# Patient Record
Sex: Male | Born: 1940
Health system: Southern US, Community
[De-identification: ages and names within clinical notes are randomized; demographics above are authoritative.]

## PROBLEM LIST (undated history)

## (undated) DIAGNOSIS — Z972 Presence of dental prosthetic device (complete) (partial): Secondary | ICD-10-CM

## (undated) DIAGNOSIS — I714 Abdominal aortic aneurysm, without rupture, unspecified: Secondary | ICD-10-CM

## (undated) DIAGNOSIS — G4733 Obstructive sleep apnea (adult) (pediatric): Secondary | ICD-10-CM

## (undated) DIAGNOSIS — I6523 Occlusion and stenosis of bilateral carotid arteries: Secondary | ICD-10-CM

## (undated) DIAGNOSIS — I739 Peripheral vascular disease, unspecified: Secondary | ICD-10-CM

## (undated) DIAGNOSIS — E039 Hypothyroidism, unspecified: Secondary | ICD-10-CM

## (undated) DIAGNOSIS — K220 Achalasia of cardia: Secondary | ICD-10-CM

## (undated) DIAGNOSIS — R911 Solitary pulmonary nodule: Secondary | ICD-10-CM

## (undated) DIAGNOSIS — K219 Gastro-esophageal reflux disease without esophagitis: Secondary | ICD-10-CM

## (undated) DIAGNOSIS — I350 Nonrheumatic aortic (valve) stenosis: Secondary | ICD-10-CM

## (undated) DIAGNOSIS — J449 Chronic obstructive pulmonary disease, unspecified: Secondary | ICD-10-CM

## (undated) DIAGNOSIS — Z973 Presence of spectacles and contact lenses: Secondary | ICD-10-CM

## (undated) DIAGNOSIS — I10 Essential (primary) hypertension: Secondary | ICD-10-CM

## (undated) DIAGNOSIS — E119 Type 2 diabetes mellitus without complications: Secondary | ICD-10-CM

## (undated) DIAGNOSIS — F03A0 Unspecified dementia, mild, without behavioral disturbance, psychotic disturbance, mood disturbance, and anxiety: Secondary | ICD-10-CM

## (undated) DIAGNOSIS — F0390 Unspecified dementia without behavioral disturbance: Secondary | ICD-10-CM

## (undated) DIAGNOSIS — G2581 Restless legs syndrome: Secondary | ICD-10-CM

## (undated) DIAGNOSIS — Z952 Presence of prosthetic heart valve: Secondary | ICD-10-CM

## (undated) DIAGNOSIS — C61 Malignant neoplasm of prostate: Secondary | ICD-10-CM

## (undated) DIAGNOSIS — E785 Hyperlipidemia, unspecified: Secondary | ICD-10-CM

## (undated) DIAGNOSIS — K222 Esophageal obstruction: Secondary | ICD-10-CM

## (undated) HISTORY — DX: Hyperlipidemia, unspecified: E78.5

## (undated) HISTORY — PX: UPPER GASTROINTESTINAL ENDOSCOPY: SHX188

## (undated) HISTORY — DX: Gastro-esophageal reflux disease without esophagitis: K21.9

## (undated) HISTORY — PX: CARDIOVASCULAR STRESS TEST: SHX262

## (undated) HISTORY — PX: CATARACT EXTRACTION W/ INTRAOCULAR LENS  IMPLANT, BILATERAL: SHX1307

## (undated) HISTORY — DX: Restless legs syndrome: G25.81

## (undated) HISTORY — DX: Essential (primary) hypertension: I10

## (undated) HISTORY — PX: HERNIA REPAIR: SHX51

## (undated) HISTORY — PX: TRANSTHORACIC ECHOCARDIOGRAM: SHX275

## (undated) HISTORY — PX: ESOPHAGOGASTRODUODENOSCOPY (EGD) WITH ESOPHAGEAL DILATION: SHX5812

## (undated) HISTORY — PX: COLONOSCOPY: SHX174

## (undated) HISTORY — PX: PROSTATE BIOPSY: SHX241

## (undated) HISTORY — DX: Obstructive sleep apnea (adult) (pediatric): G47.33

---

## 1997-11-07 ENCOUNTER — Ambulatory Visit (HOSPITAL_COMMUNITY): Admission: RE | Admit: 1997-11-07 | Discharge: 1997-11-07 | Payer: Self-pay | Admitting: *Deleted

## 1998-06-30 HISTORY — PX: PILONIDAL CYST / SINUS EXCISION: SUR543

## 1998-12-05 ENCOUNTER — Ambulatory Visit (HOSPITAL_COMMUNITY): Admission: RE | Admit: 1998-12-05 | Discharge: 1998-12-05 | Payer: Self-pay | Admitting: *Deleted

## 1998-12-13 ENCOUNTER — Ambulatory Visit (HOSPITAL_COMMUNITY): Admission: RE | Admit: 1998-12-13 | Discharge: 1998-12-13 | Payer: Self-pay | Admitting: *Deleted

## 2001-04-06 ENCOUNTER — Encounter: Admission: RE | Admit: 2001-04-06 | Discharge: 2001-07-05 | Payer: Self-pay | Admitting: Internal Medicine

## 2002-03-21 ENCOUNTER — Encounter: Payer: Self-pay | Admitting: Internal Medicine

## 2002-03-21 ENCOUNTER — Ambulatory Visit (HOSPITAL_COMMUNITY): Admission: RE | Admit: 2002-03-21 | Discharge: 2002-03-21 | Payer: Self-pay | Admitting: Internal Medicine

## 2003-05-22 ENCOUNTER — Ambulatory Visit (HOSPITAL_COMMUNITY): Admission: RE | Admit: 2003-05-22 | Discharge: 2003-05-22 | Payer: Self-pay | Admitting: Internal Medicine

## 2004-05-21 ENCOUNTER — Ambulatory Visit (HOSPITAL_COMMUNITY): Admission: RE | Admit: 2004-05-21 | Discharge: 2004-05-21 | Payer: Self-pay | Admitting: Internal Medicine

## 2005-06-09 ENCOUNTER — Ambulatory Visit (HOSPITAL_COMMUNITY): Admission: RE | Admit: 2005-06-09 | Discharge: 2005-06-09 | Payer: Self-pay | Admitting: Internal Medicine

## 2008-06-30 HISTORY — PX: ANAL FISTULECTOMY: SHX1139

## 2009-07-12 ENCOUNTER — Ambulatory Visit: Payer: Self-pay

## 2009-07-12 ENCOUNTER — Encounter (INDEPENDENT_AMBULATORY_CARE_PROVIDER_SITE_OTHER): Payer: Self-pay | Admitting: Internal Medicine

## 2009-07-12 ENCOUNTER — Ambulatory Visit (HOSPITAL_COMMUNITY): Admission: RE | Admit: 2009-07-12 | Discharge: 2009-07-12 | Payer: Self-pay | Admitting: Internal Medicine

## 2009-07-12 ENCOUNTER — Ambulatory Visit: Payer: Self-pay | Admitting: Cardiology

## 2009-09-10 ENCOUNTER — Encounter: Payer: Self-pay | Admitting: Cardiovascular Disease

## 2009-09-11 ENCOUNTER — Encounter: Payer: Self-pay | Admitting: Cardiovascular Disease

## 2009-09-11 ENCOUNTER — Ambulatory Visit (HOSPITAL_COMMUNITY): Admission: RE | Admit: 2009-09-11 | Discharge: 2009-09-11 | Payer: Self-pay | Admitting: Internal Medicine

## 2009-09-19 ENCOUNTER — Ambulatory Visit (HOSPITAL_COMMUNITY): Admission: RE | Admit: 2009-09-19 | Discharge: 2009-09-19 | Payer: Self-pay | Admitting: Internal Medicine

## 2009-09-28 DIAGNOSIS — K219 Gastro-esophageal reflux disease without esophagitis: Secondary | ICD-10-CM

## 2009-09-28 DIAGNOSIS — I10 Essential (primary) hypertension: Secondary | ICD-10-CM | POA: Insufficient documentation

## 2009-10-01 ENCOUNTER — Ambulatory Visit: Payer: Self-pay | Admitting: Cardiovascular Disease

## 2009-10-02 ENCOUNTER — Telehealth (INDEPENDENT_AMBULATORY_CARE_PROVIDER_SITE_OTHER): Payer: Self-pay | Admitting: *Deleted

## 2009-10-03 ENCOUNTER — Encounter (HOSPITAL_COMMUNITY): Admission: RE | Admit: 2009-10-03 | Discharge: 2009-11-28 | Payer: Self-pay | Admitting: Cardiovascular Disease

## 2009-10-03 ENCOUNTER — Encounter: Payer: Self-pay | Admitting: Cardiovascular Disease

## 2009-10-03 ENCOUNTER — Ambulatory Visit: Payer: Self-pay

## 2009-10-03 ENCOUNTER — Ambulatory Visit: Payer: Self-pay | Admitting: Cardiology

## 2009-10-09 ENCOUNTER — Encounter (INDEPENDENT_AMBULATORY_CARE_PROVIDER_SITE_OTHER): Payer: Self-pay | Admitting: *Deleted

## 2009-10-30 LAB — HM COLONOSCOPY

## 2010-07-30 NOTE — Letter (Signed)
Summary: Radiology Report  Radiology Report   Imported By: Earl Many 09/28/2009 15:16:38  _____________________________________________________________________  External Attachment:    Type:   Image     Comment:   External Document

## 2010-07-30 NOTE — Assessment & Plan Note (Signed)
Summary: Cardiology Nuclear Study  Nuclear Med Background Indications for Stress Test: Evaluation for Ischemia   History: COPD, Echo, Myocardial Perfusion Study  History Comments: 12/04 MPS: EF=61%, (-) ischemia 1/11 Echo: EF=60-65%  Symptoms: Dizziness, DOE, Light-Headedness, SOB    Nuclear Pre-Procedure Cardiac Risk Factors: Family History - CAD, Hypertension, Lipids, PVD Caffeine/Decaff Intake: None NPO After: 7:00 PM Lungs: clear IV 0.9% NS with Angio Cath: 22g     IV Site: (R) AC IV Started by: Irean Hong RN Chest Size (in) 42     Height (in): 65 Weight (lb): 178 BMI: 29.73  Nuclear Med Study 1 or 2 day study:  1 day     Stress Test Type:  Eugenie Birks Reading MD:  Olga Millers, MD     Referring MD:  P. Nishan Resting Radionuclide:  Technetium 46m Tetrofosmin     Resting Radionuclide Dose:  11 mCi  Stress Radionuclide:  Technetium 31m Tetrofosmin     Stress Radionuclide Dose:  33 mCi   Stress Protocol   Lexiscan: 0.4 mg   Stress Test Technologist:  Milana Na EMT-P     Nuclear Technologist:  Domenic Polite CNMT  Rest Procedure  Myocardial perfusion imaging was performed at rest 45 minutes following the intravenous administration of Myoview Technetium 28m Tetrofosmin.  Stress Procedure  The patient received IV Lexiscan 0.4 mg over 15-seconds.  Myoview injected at 30-seconds.  There were no significant changes, chest pressure, and sob with infusion.  Quantitative spect images were obtained after a 45 minute delay.  QPS Raw Data Images:  Acuisition technically good; normal left ventricular size. Stress Images:  There is decreased uptake in the inferior wall. Rest Images:  There is decreased uptake in the inferior wall. Subtraction (SDS):  No evidence of ischemia. Transient Ischemic Dilatation:  1.11  (Normal <1.22)  Lung/Heart Ratio:  .31  (Normal <0.45)  Quantitative Gated Spect Images QGS EDV:  71 ml QGS ESV:  20 ml QGS EF:  73 % QGS cine images:   Normal wall motion.   Overall Impression  Exercise Capacity: Lexiscan study with no exercise. BP Response: Normal blood pressure response. Clinical Symptoms: There is  chest pain ECG Impression: No significant ST segment change suggestive of ischemia. Overall Impression: There is inferior thinning but  no sign of scar or ischemia.  Appended Document: Cardiology Nuclear Study normal nuclear study  Appended Document: Cardiology Nuclear Study n/a x1  Appended Document: Cardiology Nuclear Study letter of results sent to pt

## 2010-07-30 NOTE — Progress Notes (Signed)
Summary: [facility]  [facility]   Imported By: Earl Many 09/28/2009 15:18:21  _____________________________________________________________________  External Attachment:    Type:   Image     Comment:   External Document

## 2010-07-30 NOTE — Letter (Signed)
Summary: [facility] Office Note  [facility] Office Note   Imported By: Roderic Ovens 10/08/2009 13:52:38  _____________________________________________________________________  External Attachment:    Type:   Image     Comment:   External Document

## 2010-07-30 NOTE — Progress Notes (Signed)
Summary: Nuclear Pre-Procedure  Phone Note Outgoing Call Call back at [facility] Phone [phone]   Call placed by: Stanton Kidney, EMT-P,  October 02, 2009 3:26 PM Summary of Call: Unable to leave message with information on Myoview Information Sheet (see scanned document for details). (Verify Phone number)    Nuclear Med Background Indications for Stress Test: Evaluation for Ischemia   History: COPD, Echo, Myocardial Perfusion Study  History Comments: 12/04 MPS: EF=61%, (-) ischemia 1/11 Echo: EF=60-65%     Nuclear Pre-Procedure Cardiac Risk Factors: Family History - CAD, Hypertension, Lipids, PVD Height (in): 65

## 2010-07-30 NOTE — Letter (Signed)
Summary: Generic Letter  Architectural technologist, Main Office  [address]   [place], [place] [address]   Phone: [phone]  Fax: [phone]        October 09, 2009 MRN: [id number]    [facility] [address] [place], [place]  [address]    Dear Mr. Haman,        I have been unable to reach you by phone regarding your test results. The stress test you had was normal. That means your heart muscle is pumping strong and the blood flow into the heart is normal. You also had the ultrasound of your neck and it shows no blockage in those arteries, so this is normal as well. Please call with any questions or concerns.  Sincerely,  Deliah Goody, RN/Dr Charlton Haws  This letter has been electronically signed by your physician.

## 2010-07-30 NOTE — Assessment & Plan Note (Signed)
Summary: np6/cardiac eval/family hx of heart disease/jml   CC:  sob.  History of Present Illness: Robert Schaefer is seen today at the request of Dr Elisabeth Most for SOB, HTN and elevated lipids with a family history of CAD.  He has significant emphysema.  He quit smoking 5 years ago.  He has not had formal PFT's and is not on inhalers.  His LDL is under 70 on statins and his BP is well controlled.  He has had multiple previous myovue's in the past that were normal last being 4 years ago.  I reviewed an echo done in January which was essentially benign with normal LV and only mold AS with mean gradient 12 mmHg and peak 24 mmHg.  I don't think his dypnea is an anginal equivalent.  His baseline ECG is nomral.  He cant walk on a teadmill due to knee pain.  There was no evidence of pulmonary HTN on his echo.    Current Problems (verified): 1)  Carotid Bruit  (ICD-785.9) 2)  COPD  (ICD-496) 3)  Gerd  (ICD-530.81) 4)  Fatigue  (ICD-780.79) 5)  Hypertension  (ICD-401.9) 6)  Hiatal Hernia  (ICD-553.3)  Current Medications (verified): 1)  Zocor 20 Mg Tabs (Simvastatin) .... 1/2  Tab By Mouth Once Daily 2)  Lisinopril 20 Mg Tabs (Lisinopril) .... Take One Tablet By Mouth Daily 3)  Aspirin 81 Mg Tbec (Aspirin) .... Take One Tablet By Mouth Daily 4)  Vitamin D 2000 Unit Caps (Cholecalciferol) .[name].. 1 Tab By Mouth Two Times A Day  Allergies (verified): No Known Drug Allergies  Past History:  Past Medical History: Last updated: 09/28/2009 Current Problems:  COPD (ICD-496) GERD (ICD-530.81) FATIGUE (ICD-780.79) HYPERTENSION (ICD-401.9) HIATAL HERNIA (ICD-553.3)  Family History: Last updated: 09/28/2009 cad  Review of Systems       Denies fever, malais, weight loss, blurry vision, decreased visual acuity, cough, sputum, , hemoptysis, pleuritic pain, palpitaitons, heartburn, abdominal pain, melena, lower extremity edema, claudication, or rash.   Vital Signs:  Patient profile:   70 year old  male Height:      65 inches Weight:      182 pounds BMI:     30.40 Pulse rate:   67 / minute Resp:     14 per minute BP sitting:   128 / 64  (left arm)  Vitals Entered By: Kem Parkinson (October 01, 2009 11:08 AM)  Physical Exam  General:  Affect appropriate Healthy:  appears stated age HEENT: normal Neck supple with no adenopathy JVP normal bilateral  bruits no thyromegaly Lungs clear with mild end expitory wheezing and good diaphragmatic motion Heart:  S1/S2 mild AS  murmur,rub, gallop or click PMI normal Abdomen: benighn, BS positve, no tenderness, no AAA no bruit.  No HSM or HJR Distal pulses intact with no bruits No edema Neuro non-focal Skin warm and dry    Impression & Recommendations:  Problem # 1:  CAROTID BRUIT (ICD-785.9) May be referred from AV but check duplex Orders: Carotid Duplex (Carotid Duplex)  Problem # 2:  COPD (ICD-496) Consider PFT's and pulmonary referral if moderate.  Should be on serevant or maintainence Rx??  Problem # 3:  HYPERTENSION (ICD-401.9) Well controlled His updated medication list for this problem includes:    Lisinopril 20 Mg Tabs (Lisinopril) .[name]... Take one tablet by mouth daily    Aspirin 81 Mg Tbec (Aspirin) .[name]... Take one tablet by mouth daily  Problem # 4:  AORTIC VALVE DISORDERS (ICD-424.1) Mild AS.  Should not  be causing symptoms.  F/U echo in 2 years His updated medication list for this problem includes:    Lisinopril 20 Mg Tabs (Lisinopril) .[name]... Take one tablet by mouth daily  Problem # 5:  DYSPNEA (ICD-786.05) Likely from COPD.  Given risk factors F/U myovue His updated medication list for this problem includes:    Lisinopril 20 Mg Tabs (Lisinopril) .[name]... Take one tablet by mouth daily    Aspirin 81 Mg Tbec (Aspirin) .[name]... Take one tablet by mouth daily  Other Orders: Nuclear Stress Test (Nuc Stress Test)  Patient Instructions: 1)  Your physician recommends that you schedule a follow-up appointment in: AS  NEEDED PENDING TEST RESULTS 2)  Your physician has requested that you have an LEXISCAN stress myoview.  For further information please visit [email or web address]  Please follow instruction sheet, as given. 3)  Your physician has requested that you have a carotid duplex. This test is an ultrasound of the carotid arteries in your neck. It looks at blood flow through these arteries that supply the brain with blood. Allow one hour for this exam. There are no restrictions or special instructions.   EKG Report  Procedure date:  10/01/2009  Findings:      NSR 67 Normal ECG

## 2011-07-30 DIAGNOSIS — E039 Hypothyroidism, unspecified: Secondary | ICD-10-CM | POA: Diagnosis not present

## 2011-09-18 ENCOUNTER — Other Ambulatory Visit (HOSPITAL_COMMUNITY): Payer: Self-pay | Admitting: Physician Assistant

## 2011-09-18 ENCOUNTER — Ambulatory Visit (HOSPITAL_COMMUNITY)
Admission: RE | Admit: 2011-09-18 | Discharge: 2011-09-18 | Disposition: A | Discharge status: Home / Self Care | Payer: Medicare Other | Source: Ambulatory Visit | Attending: Physician Assistant | Admitting: Physician Assistant

## 2011-09-18 DIAGNOSIS — J449 Chronic obstructive pulmonary disease, unspecified: Secondary | ICD-10-CM | POA: Diagnosis not present

## 2011-09-18 DIAGNOSIS — E559 Vitamin D deficiency, unspecified: Secondary | ICD-10-CM | POA: Diagnosis not present

## 2011-09-18 DIAGNOSIS — Z87891 Personal history of nicotine dependence: Secondary | ICD-10-CM | POA: Diagnosis not present

## 2011-09-18 DIAGNOSIS — R7309 Other abnormal glucose: Secondary | ICD-10-CM | POA: Diagnosis not present

## 2011-09-18 DIAGNOSIS — I10 Essential (primary) hypertension: Secondary | ICD-10-CM

## 2011-09-18 DIAGNOSIS — Z79899 Other long term (current) drug therapy: Secondary | ICD-10-CM | POA: Diagnosis not present

## 2011-09-18 DIAGNOSIS — E782 Mixed hyperlipidemia: Secondary | ICD-10-CM | POA: Diagnosis not present

## 2011-09-18 DIAGNOSIS — Z1212 Encounter for screening for malignant neoplasm of rectum: Secondary | ICD-10-CM | POA: Diagnosis not present

## 2011-09-18 DIAGNOSIS — J4489 Other specified chronic obstructive pulmonary disease: Secondary | ICD-10-CM | POA: Insufficient documentation

## 2011-09-18 DIAGNOSIS — Z125 Encounter for screening for malignant neoplasm of prostate: Secondary | ICD-10-CM | POA: Diagnosis not present

## 2011-09-18 DIAGNOSIS — R0602 Shortness of breath: Secondary | ICD-10-CM | POA: Insufficient documentation

## 2011-09-18 DIAGNOSIS — R972 Elevated prostate specific antigen [PSA]: Secondary | ICD-10-CM | POA: Diagnosis not present

## 2011-09-24 ENCOUNTER — Other Ambulatory Visit: Payer: Self-pay | Admitting: Gastroenterology

## 2011-09-24 DIAGNOSIS — R1314 Dysphagia, pharyngoesophageal phase: Secondary | ICD-10-CM | POA: Diagnosis not present

## 2011-09-30 ENCOUNTER — Ambulatory Visit
Admission: RE | Admit: 2011-09-30 | Discharge: 2011-09-30 | Disposition: A | Discharge status: Home / Self Care | Payer: Medicare Other | Source: Ambulatory Visit | Attending: Gastroenterology | Admitting: Gastroenterology

## 2011-09-30 DIAGNOSIS — K222 Esophageal obstruction: Secondary | ICD-10-CM | POA: Diagnosis not present

## 2011-09-30 DIAGNOSIS — K228 Other specified diseases of esophagus: Secondary | ICD-10-CM | POA: Diagnosis not present

## 2011-09-30 DIAGNOSIS — R1310 Dysphagia, unspecified: Secondary | ICD-10-CM | POA: Diagnosis not present

## 2011-10-20 DIAGNOSIS — K449 Diaphragmatic hernia without obstruction or gangrene: Secondary | ICD-10-CM | POA: Diagnosis not present

## 2011-10-20 DIAGNOSIS — R933 Abnormal findings on diagnostic imaging of other parts of digestive tract: Secondary | ICD-10-CM | POA: Diagnosis not present

## 2011-10-20 DIAGNOSIS — R1310 Dysphagia, unspecified: Secondary | ICD-10-CM | POA: Diagnosis not present

## 2011-10-20 DIAGNOSIS — K222 Esophageal obstruction: Secondary | ICD-10-CM | POA: Diagnosis not present

## 2011-10-24 DIAGNOSIS — R972 Elevated prostate specific antigen [PSA]: Secondary | ICD-10-CM | POA: Diagnosis not present

## 2011-12-15 DIAGNOSIS — R972 Elevated prostate specific antigen [PSA]: Secondary | ICD-10-CM | POA: Diagnosis not present

## 2011-12-15 DIAGNOSIS — E782 Mixed hyperlipidemia: Secondary | ICD-10-CM | POA: Diagnosis not present

## 2011-12-15 DIAGNOSIS — R7309 Other abnormal glucose: Secondary | ICD-10-CM | POA: Diagnosis not present

## 2011-12-15 DIAGNOSIS — E559 Vitamin D deficiency, unspecified: Secondary | ICD-10-CM | POA: Diagnosis not present

## 2011-12-15 DIAGNOSIS — Z79899 Other long term (current) drug therapy: Secondary | ICD-10-CM | POA: Diagnosis not present

## 2011-12-15 DIAGNOSIS — I10 Essential (primary) hypertension: Secondary | ICD-10-CM | POA: Diagnosis not present

## 2012-01-15 DIAGNOSIS — L821 Other seborrheic keratosis: Secondary | ICD-10-CM | POA: Diagnosis not present

## 2012-01-15 DIAGNOSIS — I781 Nevus, non-neoplastic: Secondary | ICD-10-CM | POA: Diagnosis not present

## 2012-01-15 DIAGNOSIS — L919 Hypertrophic disorder of the skin, unspecified: Secondary | ICD-10-CM | POA: Diagnosis not present

## 2012-01-15 DIAGNOSIS — L909 Atrophic disorder of skin, unspecified: Secondary | ICD-10-CM | POA: Diagnosis not present

## 2012-03-17 DIAGNOSIS — E559 Vitamin D deficiency, unspecified: Secondary | ICD-10-CM | POA: Diagnosis not present

## 2012-03-17 DIAGNOSIS — E782 Mixed hyperlipidemia: Secondary | ICD-10-CM | POA: Diagnosis not present

## 2012-03-17 DIAGNOSIS — I10 Essential (primary) hypertension: Secondary | ICD-10-CM | POA: Diagnosis not present

## 2012-03-17 DIAGNOSIS — Z79899 Other long term (current) drug therapy: Secondary | ICD-10-CM | POA: Diagnosis not present

## 2012-03-17 DIAGNOSIS — R7309 Other abnormal glucose: Secondary | ICD-10-CM | POA: Diagnosis not present

## 2012-03-31 DIAGNOSIS — G4700 Insomnia, unspecified: Secondary | ICD-10-CM | POA: Diagnosis not present

## 2012-03-31 DIAGNOSIS — G4710 Hypersomnia, unspecified: Secondary | ICD-10-CM | POA: Diagnosis not present

## 2012-03-31 DIAGNOSIS — G4730 Sleep apnea, unspecified: Secondary | ICD-10-CM | POA: Diagnosis not present

## 2012-03-31 DIAGNOSIS — R0989 Other specified symptoms and signs involving the circulatory and respiratory systems: Secondary | ICD-10-CM | POA: Diagnosis not present

## 2012-03-31 DIAGNOSIS — G4733 Obstructive sleep apnea (adult) (pediatric): Secondary | ICD-10-CM | POA: Diagnosis not present

## 2012-03-31 DIAGNOSIS — R0609 Other forms of dyspnea: Secondary | ICD-10-CM | POA: Diagnosis not present

## 2012-04-20 DIAGNOSIS — E782 Mixed hyperlipidemia: Secondary | ICD-10-CM | POA: Diagnosis not present

## 2012-04-20 DIAGNOSIS — I10 Essential (primary) hypertension: Secondary | ICD-10-CM | POA: Diagnosis not present

## 2012-04-20 DIAGNOSIS — R972 Elevated prostate specific antigen [PSA]: Secondary | ICD-10-CM | POA: Diagnosis not present

## 2012-04-20 DIAGNOSIS — R7309 Other abnormal glucose: Secondary | ICD-10-CM | POA: Diagnosis not present

## 2012-04-26 ENCOUNTER — Other Ambulatory Visit (HOSPITAL_COMMUNITY): Payer: Self-pay | Admitting: Internal Medicine

## 2012-04-26 DIAGNOSIS — R0602 Shortness of breath: Secondary | ICD-10-CM

## 2012-04-27 ENCOUNTER — Ambulatory Visit (HOSPITAL_COMMUNITY): Payer: Medicare Other | Attending: Cardiology | Admitting: Radiology

## 2012-04-27 DIAGNOSIS — I10 Essential (primary) hypertension: Secondary | ICD-10-CM | POA: Diagnosis not present

## 2012-04-27 DIAGNOSIS — R5383 Other fatigue: Secondary | ICD-10-CM | POA: Diagnosis not present

## 2012-04-27 DIAGNOSIS — I369 Nonrheumatic tricuspid valve disorder, unspecified: Secondary | ICD-10-CM | POA: Diagnosis not present

## 2012-04-27 DIAGNOSIS — R0609 Other forms of dyspnea: Secondary | ICD-10-CM | POA: Diagnosis not present

## 2012-04-27 DIAGNOSIS — G4733 Obstructive sleep apnea (adult) (pediatric): Secondary | ICD-10-CM | POA: Diagnosis not present

## 2012-04-27 DIAGNOSIS — I080 Rheumatic disorders of both mitral and aortic valves: Secondary | ICD-10-CM | POA: Insufficient documentation

## 2012-04-27 DIAGNOSIS — R0989 Other specified symptoms and signs involving the circulatory and respiratory systems: Secondary | ICD-10-CM

## 2012-04-27 DIAGNOSIS — J4489 Other specified chronic obstructive pulmonary disease: Secondary | ICD-10-CM | POA: Insufficient documentation

## 2012-04-27 DIAGNOSIS — J449 Chronic obstructive pulmonary disease, unspecified: Secondary | ICD-10-CM | POA: Diagnosis not present

## 2012-04-27 DIAGNOSIS — R0602 Shortness of breath: Secondary | ICD-10-CM

## 2012-04-27 DIAGNOSIS — G4700 Insomnia, unspecified: Secondary | ICD-10-CM | POA: Diagnosis not present

## 2012-04-27 NOTE — Progress Notes (Signed)
Echocardiogram performed.  

## 2012-04-28 ENCOUNTER — Encounter (HOSPITAL_COMMUNITY): Payer: Self-pay | Admitting: Internal Medicine

## 2012-06-21 DIAGNOSIS — E782 Mixed hyperlipidemia: Secondary | ICD-10-CM | POA: Diagnosis not present

## 2012-06-21 DIAGNOSIS — R7309 Other abnormal glucose: Secondary | ICD-10-CM | POA: Diagnosis not present

## 2012-06-21 DIAGNOSIS — I10 Essential (primary) hypertension: Secondary | ICD-10-CM | POA: Diagnosis not present

## 2012-06-21 DIAGNOSIS — E559 Vitamin D deficiency, unspecified: Secondary | ICD-10-CM | POA: Diagnosis not present

## 2012-06-21 DIAGNOSIS — Z79899 Other long term (current) drug therapy: Secondary | ICD-10-CM | POA: Diagnosis not present

## 2012-07-21 DIAGNOSIS — R7309 Other abnormal glucose: Secondary | ICD-10-CM | POA: Diagnosis not present

## 2012-07-21 DIAGNOSIS — R823 Hemoglobinuria: Secondary | ICD-10-CM | POA: Diagnosis not present

## 2012-07-21 DIAGNOSIS — N419 Inflammatory disease of prostate, unspecified: Secondary | ICD-10-CM | POA: Diagnosis not present

## 2012-07-21 DIAGNOSIS — I10 Essential (primary) hypertension: Secondary | ICD-10-CM | POA: Diagnosis not present

## 2012-09-22 DIAGNOSIS — Z125 Encounter for screening for malignant neoplasm of prostate: Secondary | ICD-10-CM | POA: Diagnosis not present

## 2012-09-22 DIAGNOSIS — E119 Type 2 diabetes mellitus without complications: Secondary | ICD-10-CM | POA: Diagnosis not present

## 2012-09-22 DIAGNOSIS — E559 Vitamin D deficiency, unspecified: Secondary | ICD-10-CM | POA: Diagnosis not present

## 2012-09-22 DIAGNOSIS — N3000 Acute cystitis without hematuria: Secondary | ICD-10-CM | POA: Diagnosis not present

## 2012-09-22 DIAGNOSIS — M79609 Pain in unspecified limb: Secondary | ICD-10-CM | POA: Diagnosis not present

## 2012-09-22 DIAGNOSIS — Z79899 Other long term (current) drug therapy: Secondary | ICD-10-CM | POA: Diagnosis not present

## 2012-09-22 DIAGNOSIS — I10 Essential (primary) hypertension: Secondary | ICD-10-CM | POA: Diagnosis not present

## 2012-09-22 DIAGNOSIS — E782 Mixed hyperlipidemia: Secondary | ICD-10-CM | POA: Diagnosis not present

## 2012-12-05 DIAGNOSIS — G44209 Tension-type headache, unspecified, not intractable: Secondary | ICD-10-CM | POA: Diagnosis not present

## 2012-12-22 DIAGNOSIS — E559 Vitamin D deficiency, unspecified: Secondary | ICD-10-CM | POA: Diagnosis not present

## 2012-12-22 DIAGNOSIS — R972 Elevated prostate specific antigen [PSA]: Secondary | ICD-10-CM | POA: Diagnosis not present

## 2012-12-22 DIAGNOSIS — E782 Mixed hyperlipidemia: Secondary | ICD-10-CM | POA: Diagnosis not present

## 2012-12-22 DIAGNOSIS — R7309 Other abnormal glucose: Secondary | ICD-10-CM | POA: Diagnosis not present

## 2012-12-22 DIAGNOSIS — I10 Essential (primary) hypertension: Secondary | ICD-10-CM | POA: Diagnosis not present

## 2012-12-22 DIAGNOSIS — Z79899 Other long term (current) drug therapy: Secondary | ICD-10-CM | POA: Diagnosis not present

## 2013-02-02 DIAGNOSIS — N400 Enlarged prostate without lower urinary tract symptoms: Secondary | ICD-10-CM | POA: Diagnosis not present

## 2013-03-28 ENCOUNTER — Ambulatory Visit (HOSPITAL_COMMUNITY)
Admission: RE | Admit: 2013-03-28 | Discharge: 2013-03-28 | Disposition: A | Discharge status: Home / Self Care | Payer: Medicare Other | Source: Ambulatory Visit | Attending: Physician Assistant | Admitting: Physician Assistant

## 2013-03-28 ENCOUNTER — Other Ambulatory Visit (HOSPITAL_COMMUNITY): Payer: Self-pay | Admitting: Physician Assistant

## 2013-03-28 DIAGNOSIS — R7309 Other abnormal glucose: Secondary | ICD-10-CM | POA: Diagnosis not present

## 2013-03-28 DIAGNOSIS — E782 Mixed hyperlipidemia: Secondary | ICD-10-CM | POA: Diagnosis not present

## 2013-03-28 DIAGNOSIS — I700 Atherosclerosis of aorta: Secondary | ICD-10-CM | POA: Insufficient documentation

## 2013-03-28 DIAGNOSIS — M545 Low back pain, unspecified: Secondary | ICD-10-CM | POA: Insufficient documentation

## 2013-03-28 DIAGNOSIS — M5137 Other intervertebral disc degeneration, lumbosacral region: Secondary | ICD-10-CM | POA: Diagnosis not present

## 2013-03-28 DIAGNOSIS — E559 Vitamin D deficiency, unspecified: Secondary | ICD-10-CM | POA: Diagnosis not present

## 2013-03-28 DIAGNOSIS — I77819 Aortic ectasia, unspecified site: Secondary | ICD-10-CM | POA: Insufficient documentation

## 2013-03-28 DIAGNOSIS — M79609 Pain in unspecified limb: Secondary | ICD-10-CM | POA: Diagnosis not present

## 2013-03-28 DIAGNOSIS — Z79899 Other long term (current) drug therapy: Secondary | ICD-10-CM | POA: Diagnosis not present

## 2013-03-28 DIAGNOSIS — I10 Essential (primary) hypertension: Secondary | ICD-10-CM | POA: Diagnosis not present

## 2013-04-13 DIAGNOSIS — M533 Sacrococcygeal disorders, not elsewhere classified: Secondary | ICD-10-CM | POA: Diagnosis not present

## 2013-04-21 DIAGNOSIS — M533 Sacrococcygeal disorders, not elsewhere classified: Secondary | ICD-10-CM | POA: Diagnosis not present

## 2013-04-28 DIAGNOSIS — L570 Actinic keratosis: Secondary | ICD-10-CM | POA: Diagnosis not present

## 2013-05-04 DIAGNOSIS — M533 Sacrococcygeal disorders, not elsewhere classified: Secondary | ICD-10-CM | POA: Diagnosis not present

## 2013-05-17 DIAGNOSIS — N400 Enlarged prostate without lower urinary tract symptoms: Secondary | ICD-10-CM | POA: Diagnosis not present

## 2013-07-04 DIAGNOSIS — G4733 Obstructive sleep apnea (adult) (pediatric): Secondary | ICD-10-CM | POA: Insufficient documentation

## 2013-07-04 DIAGNOSIS — R7303 Prediabetes: Secondary | ICD-10-CM | POA: Insufficient documentation

## 2013-07-04 DIAGNOSIS — K589 Irritable bowel syndrome without diarrhea: Secondary | ICD-10-CM | POA: Insufficient documentation

## 2013-07-04 DIAGNOSIS — E782 Mixed hyperlipidemia: Secondary | ICD-10-CM | POA: Insufficient documentation

## 2013-07-04 DIAGNOSIS — E785 Hyperlipidemia, unspecified: Secondary | ICD-10-CM

## 2013-07-04 DIAGNOSIS — G2581 Restless legs syndrome: Secondary | ICD-10-CM | POA: Insufficient documentation

## 2013-07-04 DIAGNOSIS — J449 Chronic obstructive pulmonary disease, unspecified: Secondary | ICD-10-CM | POA: Insufficient documentation

## 2013-07-04 NOTE — Progress Notes (Signed)
Patient ID: Robert Schaefer, male   DOB: 1940-07-19, 73 y.o.   MRN: [id number]   This very nice 73 y.o. MWM presents for 3 month follow up with Hypertension, Hyperlipidemia, Hypothyroidism,COPD/Asthma, GERD/IBS, Hypothyroidism Pre-Diabetes and Vitamin D Deficiency.    HTN predates since 1995. BP has been controlled at home. Today's BP: 126/44 mmHg . Patient denies any cardiac type chest pain, palpitations, dyspnea/orthopnea/PND, dizziness, claudication, or dependent edema.   Hyperlipidemia is controlled with diet & meds. Last Cholesterol was 126, Triglycerides were   123, HDL 47 and LDL  56 in Sept - all at goal. Patient denies myalgias or other med SE's.    Also, the patient has history of PreDiabetes since 2002 with last A1c of  6.1% in Sept. Patient denies any symptoms of reactive hypoglycemia, diabetic polys, paresthesias or visual blurring.   Patient is being followed by Dr Gaynelle Arabian for his BPH/ Prostatism and desires switch to a less expensive medicine than Uroxatral at ~ $150 / mo. He reports improvement on the medication. He is also being followed for a rising PSA.   Further, Patient has history of Vitamin D Deficiency with last vitamin D of 90 in Sept 2014. Patient supplements vitamin D without any suspected side-effects.    Medication List     aspirin 81 MG chewable tablet  Chew 81 mg by mouth daily.     doxazosin 8 MG tablet  Commonly known as:  CARDURA  1/4 to 1/2 tablet at bedtime  for prostate and bladder emptying as directed     fluticasone 50 MCG/ACT nasal spray  Commonly known as:  FLONASE  Place 2 sprays into both nostrils daily.     levothyroxine 50 MCG tablet  Commonly known as:  SYNTHROID, LEVOTHROID  Take 50 mcg by mouth daily before breakfast.     lisinopril 20 MG tablet  Commonly known as:  PRINIVIL,ZESTRIL  Take 20 mg by mouth daily.     simvastatin 20 MG tablet  Commonly known as:  ZOCOR  Take 20 mg by mouth daily. Takes 1/2 tab daily     VITAMIN D PO   Take 7,000 Int'l Units by mouth daily.          Allergies  Allergen Reactions  . Penicillins Anaphylaxis and Swelling  . Sulfa Antibiotics Anaphylaxis and Swelling    PMHx:   Past Medical History  Diagnosis Date  . Hypertension   . Hyperlipidemia   . GERD (gastroesophageal reflux disease)   . Prediabetes   . Asthma   . IBS (irritable bowel syndrome)   . Thyroid disease   . RLS (restless legs syndrome)   . OSA (obstructive sleep apnea)     FHx:    Reviewed / unchanged  SHx:    Reviewed / unchanged  Systems Review: Constitutional: Denies fever, chills, wt changes, headaches, insomnia, fatigue, night sweats, change in appetite. Eyes: Denies redness, blurred vision, diplopia, discharge, itchy, watery eyes.  ENT: Denies discharge, congestion, post nasal drip, epistaxis, sore throat, earache, hearing loss, dental pain, tinnitus, vertigo, sinus pain, snoring.  CV: Denies chest pain, palpitations, irregular heartbeat, syncope, dyspnea, diaphoresis, orthopnea, PND, claudication, edema. Respiratory: denies cough, dyspnea, DOE, pleurisy, hoarseness, laryngitis, wheezing.  Gastrointestinal: Denies dysphagia, odynophagia, heartburn, reflux, water brash, abdominal pain or cramps, nausea, vomiting, bloating, diarrhea, constipation, hematemesis, melena, hematochezia,  or hemorrhoids. Genitourinary: Denies dysuria, frequency, urgency, nocturia, hesitancy, discharge, hematuria, flank pain. Musculoskeletal: Denies arthralgias, myalgias, stiffness, jt. swelling, pain, limp, strain/sprain.  Skin: Denies pruritus, rash, hives, warts,  acne, eczema, change in skin lesion(s). Neuro: No weakness, tremor, incoordination, spasms, paresthesia, or pain. Psychiatric: Denies confusion, memory loss, or sensory loss. Endo: Denies change in weight, skin, hair change.  Heme/Lymph: No excessive bleeding, bruising, orenlarged lymph nodes.  BP: 126/44  Pulse: 72  Temp: 98.6 F (37 C)  Resp: 16      body mass index is 31.45 kg/(m^2)    Height  5\' 5"    Weight  189 lb  On Exam: Appears well nourished - in no distress. Eyes: PERRLA, EOMs, conjunctiva no swelling or erythema. Sinuses: No frontal/maxillary tenderness ENT/Mouth: EAC's clear, TM's nl w/o erythema, bulging. Nares clear w/o erythema, swelling, exudates. Oropharynx clear without erythema or exudates. Oral hygiene is good. Tongue normal, non obstructing. Hearing intact.  Neck: Supple. Thyroid nl. Car 2+/2+ without bruits, nodes or JVD. Chest: Respirations nl with BS clear & equal w/o rales, rhonchi, wheezing or stridor.  Cor: Heart sounds normal w/ regular rate and rhythm without sig. murmurs, gallops, clicks, or rubs. Peripheral pulses normal and equal  without edema.  Abdomen: Soft & bowel sounds normal. Non-tender w/o guarding, rebound, hernias, masses, or organomegaly.  Lymphatics: Unremarkable.  Musculoskeletal: Full ROM all peripheral extremities, joint stability, 5/5 strength, and normal gait.  Skin: Warm, dry without exposed rashes, lesions, ecchymosis apparent.  Neuro: Cranial nerves intact, reflexes equal bilaterally. Sensory-motor testing grossly intact. Tendon reflexes grossly intact.  Pysch: Alert & oriented x 3. Insight and judgement nl & appropriate. No ideations.  Assessment and Plan:  1. Hypertension - Continue monitor blood pressure at home. Continue diet/meds same.  2. Hyperlipidemia - Continue diet/meds, exercise,& lifestyle modifications. Continue monitor periodic cholesterol/liver & renal functions   3. Pre-diabetes/Insulin Resistance - Continue diet, exercise, lifestyle modifications. Monitor appropriate labs.  4. Vitamin D Deficiency - Continue supplementation.  5 Hypothyroidism  6. GERD  7. BPH/ Prostatism - switch Uroxotral to doxazosin of $$$ concerns  Recommended regular exercise, BP monitoring, weight control, and discussed med and SE's. Recommended labs to assess and monitor clinical status.  Further disposition pending results of labs.

## 2013-07-04 NOTE — Patient Instructions (Signed)

## 2013-07-05 ENCOUNTER — Encounter: Payer: Self-pay | Admitting: Internal Medicine

## 2013-07-05 ENCOUNTER — Ambulatory Visit (INDEPENDENT_AMBULATORY_CARE_PROVIDER_SITE_OTHER): Payer: Medicare Other | Admitting: Internal Medicine

## 2013-07-05 ENCOUNTER — Other Ambulatory Visit: Payer: Self-pay | Admitting: Internal Medicine

## 2013-07-05 VITALS — BP 126/44 | HR 72 | Temp 98.6°F | Resp 16 | Wt 189.0 lb

## 2013-07-05 DIAGNOSIS — Z79899 Other long term (current) drug therapy: Secondary | ICD-10-CM

## 2013-07-05 DIAGNOSIS — I10 Essential (primary) hypertension: Secondary | ICD-10-CM | POA: Diagnosis not present

## 2013-07-05 DIAGNOSIS — E782 Mixed hyperlipidemia: Secondary | ICD-10-CM | POA: Diagnosis not present

## 2013-07-05 DIAGNOSIS — E559 Vitamin D deficiency, unspecified: Secondary | ICD-10-CM

## 2013-07-05 DIAGNOSIS — R7309 Other abnormal glucose: Secondary | ICD-10-CM | POA: Diagnosis not present

## 2013-07-05 LAB — CBC WITH DIFFERENTIAL/PLATELET
Basophils Absolute: 0 10*3/uL (ref 0.0–0.1)
Basophils Relative: 1 % (ref 0–1)
EOS ABS: 0.1 10*3/uL (ref 0.0–0.7)
Eosinophils Relative: 1 % (ref 0–5)
HEMATOCRIT: 38 % — AB (ref 39.0–52.0)
HEMOGLOBIN: 13.3 g/dL (ref 13.0–17.0)
Lymphocytes Relative: 17 % (ref 12–46)
Lymphs Abs: 1.1 10*3/uL (ref 0.7–4.0)
MCH: 31.8 pg (ref 26.0–34.0)
MCHC: 35 g/dL (ref 30.0–36.0)
MCV: 90.9 fL (ref 78.0–100.0)
Monocytes Absolute: 0.6 10*3/uL (ref 0.1–1.0)
Monocytes Relative: 9 % (ref 3–12)
NEUTROS ABS: 4.6 10*3/uL (ref 1.7–7.7)
NEUTROS PCT: 72 % (ref 43–77)
PLATELETS: 122 10*3/uL — AB (ref 150–400)
RBC: 4.18 MIL/uL — ABNORMAL LOW (ref 4.22–5.81)
RDW: 14.9 % (ref 11.5–15.5)
WBC: 6.3 10*3/uL (ref 4.0–10.5)

## 2013-07-05 LAB — LIPID PANEL
CHOL/HDL RATIO: 2.2 ratio
CHOLESTEROL: 122 mg/dL (ref 0–200)
HDL: 55 mg/dL (ref >39)
LDL Cholesterol: 55 mg/dL (ref 0–99)
TRIGLYCERIDES: 60 mg/dL (ref <150)
VLDL: 12 mg/dL (ref 0–40)

## 2013-07-05 LAB — HEPATIC FUNCTION PANEL
ALK PHOS: 37 U/L — AB (ref 39–117)
ALT: 8 U/L (ref 0–53)
AST: 12 U/L (ref 0–37)
Albumin: 3.9 g/dL (ref 3.5–5.2)
BILIRUBIN TOTAL: 1.2 mg/dL (ref 0.3–1.2)
Bilirubin, Direct: 0.3 mg/dL (ref 0.0–0.3)
Indirect Bilirubin: 0.9 mg/dL (ref 0.0–0.9)
TOTAL PROTEIN: 6.4 g/dL (ref 6.0–8.3)

## 2013-07-05 LAB — BASIC METABOLIC PANEL WITH GFR
BUN: 19 mg/dL (ref 6–23)
CHLORIDE: 107 meq/L (ref 96–112)
CO2: 24 meq/L (ref 19–32)
CREATININE: 0.93 mg/dL (ref 0.50–1.35)
Calcium: 9.4 mg/dL (ref 8.4–10.5)
GFR, EST NON AFRICAN AMERICAN: 81 mL/min
GLUCOSE: 122 mg/dL — AB (ref 70–99)
POTASSIUM: 4.1 meq/L (ref 3.5–5.3)
Sodium: 142 mEq/L (ref 135–145)

## 2013-07-05 LAB — HEMOGLOBIN A1C
HEMOGLOBIN A1C: 5.9 % — AB (ref <5.7)
MEAN PLASMA GLUCOSE: 123 mg/dL — AB (ref <117)

## 2013-07-05 LAB — TSH: TSH: 2.054 u[IU]/mL (ref 0.350–4.500)

## 2013-07-05 LAB — MAGNESIUM: MAGNESIUM: 1.9 mg/dL (ref 1.5–2.5)

## 2013-07-05 MED ORDER — DOXAZOSIN MESYLATE 8 MG PO TABS: ORAL_TABLET | ORAL | Status: DC

## 2013-07-06 LAB — INSULIN, FASTING: INSULIN FASTING, SERUM: 21 u[IU]/mL (ref 3–28)

## 2013-07-06 LAB — VITAMIN D 25 HYDROXY (VIT D DEFICIENCY, FRACTURES): VIT D 25 HYDROXY: 83 ng/mL (ref 30–89)

## 2013-07-15 ENCOUNTER — Ambulatory Visit: Payer: Self-pay | Admitting: Internal Medicine

## 2013-08-10 ENCOUNTER — Other Ambulatory Visit: Payer: Self-pay | Admitting: Internal Medicine

## 2013-09-22 ENCOUNTER — Ambulatory Visit (INDEPENDENT_AMBULATORY_CARE_PROVIDER_SITE_OTHER): Payer: Medicare Other | Admitting: Physician Assistant

## 2013-09-22 ENCOUNTER — Encounter: Payer: Self-pay | Admitting: Physician Assistant

## 2013-09-22 ENCOUNTER — Ambulatory Visit (HOSPITAL_COMMUNITY)
Admission: RE | Admit: 2013-09-22 | Discharge: 2013-09-22 | Disposition: A | Discharge status: Home / Self Care | Payer: Medicare Other | Source: Ambulatory Visit | Attending: Physician Assistant | Admitting: Physician Assistant

## 2013-09-22 VITALS — BP 130/60 | HR 68 | Temp 98.8°F | Resp 16 | Ht 66.0 in | Wt 185.0 lb

## 2013-09-22 DIAGNOSIS — R059 Cough, unspecified: Secondary | ICD-10-CM | POA: Diagnosis not present

## 2013-09-22 DIAGNOSIS — Z79899 Other long term (current) drug therapy: Secondary | ICD-10-CM | POA: Diagnosis not present

## 2013-09-22 DIAGNOSIS — R911 Solitary pulmonary nodule: Secondary | ICD-10-CM | POA: Insufficient documentation

## 2013-09-22 DIAGNOSIS — J449 Chronic obstructive pulmonary disease, unspecified: Secondary | ICD-10-CM

## 2013-09-22 DIAGNOSIS — G4733 Obstructive sleep apnea (adult) (pediatric): Secondary | ICD-10-CM

## 2013-09-22 DIAGNOSIS — Z0000 Encounter for general adult medical examination without abnormal findings: Secondary | ICD-10-CM

## 2013-09-22 DIAGNOSIS — R0989 Other specified symptoms and signs involving the circulatory and respiratory systems: Secondary | ICD-10-CM | POA: Insufficient documentation

## 2013-09-22 DIAGNOSIS — R05 Cough: Secondary | ICD-10-CM | POA: Insufficient documentation

## 2013-09-22 DIAGNOSIS — E559 Vitamin D deficiency, unspecified: Secondary | ICD-10-CM

## 2013-09-22 DIAGNOSIS — R7309 Other abnormal glucose: Secondary | ICD-10-CM | POA: Diagnosis not present

## 2013-09-22 DIAGNOSIS — Z23 Encounter for immunization: Secondary | ICD-10-CM | POA: Diagnosis not present

## 2013-09-22 DIAGNOSIS — E785 Hyperlipidemia, unspecified: Secondary | ICD-10-CM

## 2013-09-22 DIAGNOSIS — Z1331 Encounter for screening for depression: Secondary | ICD-10-CM | POA: Diagnosis not present

## 2013-09-22 DIAGNOSIS — N6320 Unspecified lump in the left breast, unspecified quadrant: Secondary | ICD-10-CM

## 2013-09-22 DIAGNOSIS — R7303 Prediabetes: Secondary | ICD-10-CM

## 2013-09-22 DIAGNOSIS — I10 Essential (primary) hypertension: Secondary | ICD-10-CM | POA: Diagnosis not present

## 2013-09-22 DIAGNOSIS — E079 Disorder of thyroid, unspecified: Secondary | ICD-10-CM

## 2013-09-22 LAB — CBC WITH DIFFERENTIAL/PLATELET
Basophils Absolute: 0 10*3/uL (ref 0.0–0.1)
Basophils Relative: 1 % (ref 0–1)
EOS ABS: 0.1 10*3/uL (ref 0.0–0.7)
Eosinophils Relative: 2 % (ref 0–5)
HCT: 41.5 % (ref 39.0–52.0)
Hemoglobin: 14.1 g/dL (ref 13.0–17.0)
LYMPHS ABS: 0.9 10*3/uL (ref 0.7–4.0)
LYMPHS PCT: 21 % (ref 12–46)
MCH: 31.3 pg (ref 26.0–34.0)
MCHC: 34 g/dL (ref 30.0–36.0)
MCV: 92 fL (ref 78.0–100.0)
MONO ABS: 0.4 10*3/uL (ref 0.1–1.0)
MONOS PCT: 9 % (ref 3–12)
NEUTROS ABS: 3 10*3/uL (ref 1.7–7.7)
Neutrophils Relative %: 67 % (ref 43–77)
Platelets: 111 10*3/uL — ABNORMAL LOW (ref 150–400)
RBC: 4.51 MIL/uL (ref 4.22–5.81)
RDW: 15.3 % (ref 11.5–15.5)
WBC: 4.5 10*3/uL (ref 4.0–10.5)

## 2013-09-22 MED ORDER — PANTOPRAZOLE SODIUM 40 MG PO TBEC: 40.0000 mg | DELAYED_RELEASE_TABLET | Freq: Every day | ORAL | Status: DC

## 2013-09-22 NOTE — Patient Instructions (Signed)
Preventative Care for Adults, Male       REGULAR HEALTH EXAMS:  A routine yearly physical is a good way to check in with your primary care provider about your health and preventive screening. It is also an opportunity to share updates about your health and any concerns you have, and receive a thorough all-over exam.   Most health insurance companies pay for at least some preventative services.  Check with your health plan for specific coverages.  WHAT PREVENTATIVE SERVICES DO MEN NEED?  Adult men should have their weight and blood pressure checked regularly.   Men age 35 and older should have their cholesterol levels checked regularly.  Beginning at age 50 and continuing to age 75, men should be screened for colorectal cancer.  Certain people should may need continued testing until age 85.  Other cancer screening may include exams for testicular and prostate cancer.  Updating vaccinations is part of preventative care.  Vaccinations help protect against diseases such as the flu.  Lab tests are generally done as part of preventative care to screen for anemia and blood disorders, to screen for problems with the kidneys and liver, to screen for bladder problems, to check blood sugar, and to check your cholesterol level.  Preventative services generally include counseling about diet, exercise, avoiding tobacco, drugs, excessive alcohol consumption, and sexually transmitted infections.    GENERAL RECOMMENDATIONS FOR GOOD HEALTH:  Healthy diet:  Eat a variety of foods, including fruit, vegetables, animal or vegetable protein, such as meat, fish, chicken, and eggs, or beans, lentils, tofu, and grains, such as rice.  Drink plenty of water daily.  Decrease saturated fat in the diet, avoid lots of red meat, processed foods, sweets, fast foods, and fried foods.  Exercise:  Aerobic exercise helps maintain good heart health. At least 30-40 minutes of moderate-intensity exercise is recommended.  For example, a brisk walk that increases your heart rate and breathing. This should be done on most days of the week.   Find a type of exercise or a variety of exercises that you enjoy so that it becomes a part of your daily life.  Examples are running, walking, swimming, water aerobics, and biking.  For motivation and support, explore group exercise such as aerobic class, spin class, Zumba, Yoga,or  martial arts, etc.    Set exercise goals for yourself, such as a certain weight goal, walk or run in a race such as a 5k walk/run.  Speak to your primary care provider about exercise goals.  Disease prevention:  If you smoke or chew tobacco, find out from your caregiver how to quit. It can literally save your life, no matter how long you have been a tobacco user. If you do not use tobacco, never begin.   Maintain a healthy diet and normal weight. Increased weight leads to problems with blood pressure and diabetes.   The Body Mass Index or BMI is a way of measuring how much of your body is fat. Having a BMI above 27 increases the risk of heart disease, diabetes, hypertension, stroke and other problems related to obesity. Your caregiver can help determine your BMI and based on it develop an exercise and dietary program to help you achieve or maintain this important measurement at a healthful level.  High blood pressure causes heart and blood vessel problems.  Persistent high blood pressure should be treated with medicine if weight loss and exercise do not work.   Fat and cholesterol leaves deposits in your arteries   that can block them. This causes heart disease and vessel disease elsewhere in your body.  If your cholesterol is found to be high, or if you have heart disease or certain other medical conditions, then you may need to have your cholesterol monitored frequently and be treated with medication.   Ask if you should have a stress test if your history suggests this. A stress test is a test done on  a treadmill that looks for heart disease. This test can find disease prior to there being a problem.  Avoid drinking alcohol in excess (more than two drinks per day).  Avoid use of street drugs. Do not share needles with anyone. Ask for professional help if you need assistance or instructions on stopping the use of alcohol, cigarettes, and/or drugs.  Brush your teeth twice a day with fluoride toothpaste, and floss once a day. Good oral hygiene prevents tooth decay and gum disease. The problems can be painful, unattractive, and can cause other health problems. Visit your dentist for a routine oral and dental check up and preventive care every 6-12 months.   Look at your skin regularly.  Use a mirror to look at your back. Notify your caregivers of changes in moles, especially if there are changes in shapes, colors, a size larger than a pencil eraser, an irregular border, or development of new moles.  Safety:  Use seatbelts 100% of the time, whether driving or as a passenger.  Use safety devices such as hearing protection if you work in environments with loud noise or significant background noise.  Use safety glasses when doing any work that could send debris in to the eyes.  Use a helmet if you ride a bike or motorcycle.  Use appropriate safety gear for contact sports.  Talk to your caregiver about gun safety.  Use sunscreen with a SPF (or skin protection factor) of 15 or greater.  Lighter skinned people are at a greater risk of skin cancer. Don't forget to also wear sunglasses in order to protect your eyes from too much damaging sunlight. Damaging sunlight can accelerate cataract formation.   Practice safe sex. Use condoms. Condoms are used for birth control and to help reduce the spread of sexually transmitted infections (or STIs).  Some of the STIs are gonorrhea (the clap), chlamydia, syphilis, trichomonas, herpes, HPV (human papilloma virus) and HIV (human immunodeficiency virus) which causes AIDS.  The herpes, HIV and HPV are viral illnesses that have no cure. These can result in disability, cancer and death.   Keep carbon monoxide and smoke detectors in your home functioning at all times. Change the batteries every 6 months or use a model that plugs into the wall.   Vaccinations:  Stay up to date with your tetanus shots and other required immunizations. You should have a booster for tetanus every 10 years. Be sure to get your flu shot every year, since 5%-20% of the [place] population comes down with the flu. The flu vaccine changes each year, so being vaccinated once is not enough. Get your shot in the fall, before the flu season peaks.   Other vaccines to consider:  Pneumococcal vaccine to protect against certain types of pneumonia.  This is normally recommended for adults age 65 or older.  However, adults younger than 74 years old with certain underlying conditions such as diabetes, heart or lung disease should also receive the vaccine.  Shingles vaccine to protect against Varicella Zoster if you are older than age 60, or younger   than 74 years old with certain underlying illness.  Hepatitis A vaccine to protect against a form of infection of the liver by a virus acquired from food.  Hepatitis B vaccine to protect against a form of infection of the liver by a virus acquired from blood or body fluids, particularly if you work in health care.  If you plan to travel internationally, check with your local health department for specific vaccination recommendations.  Cancer Screening:  Most routine colon cancer screening begins at the age of 59. On a yearly basis, doctors may provide special easy to use take-home tests to check for hidden blood in the stool. Sigmoidoscopy or colonoscopy can detect the earliest forms of colon cancer and is life saving. These tests use a small camera at the end of a tube to directly examine the colon. Speak to your caregiver about this at age 22, when routine  screening begins (and is repeated every 5 years unless early forms of pre-cancerous polyps or small growths are found).   At the age of 33 men usually start screening for prostate cancer every year. Screening may begin at a younger age for those with higher risk. Those at higher risk include African-Americans or having a family history of prostate cancer. There are two types of tests for prostate cancer:   Prostate-specific antigen (PSA) testing. Recent studies raise questions about prostate cancer using PSA and you should discuss this with your caregiver.   Digital rectal exam (in which your doctor's lubricated and gloved finger feels for enlargement of the prostate through the anus).   Screening for testicular cancer.  Do a monthly exam of your testicles. Gently roll each testicle between your thumb and fingers, feeling for any abnormal lumps. The best time to do this is after a hot shower or bath when the tissues are looser. Notify your caregivers of any lumps, tenderness or changes in size or shape immediately.      Bad carbs also include fruit juice, alcohol, and sweet tea. These are empty calories that do not signal to your brain that you are full.   Please remember the good carbs are still carbs which convert into sugar. So please measure them out no more than 1/2-1 cup of rice, oatmeal, pasta, and beans.  Veggies are however free foods! Pile them on.   I like lean protein at every meal such as chicken, [place], pork chops, cottage cheese, etc. Just do not fry these meats and please center your meal around vegetable, the meats should be a side dish.   No all fruit is created equal. Please see the list below, the fruit at the bottom is higher in sugars than the fruit at the top   Obesity Obesity is defined as having too much total body fat and a body mass index (BMI) of 30 or more. BMI is an estimate of body fat and is calculated from your height and weight. Obesity happens when you  consume more calories than you can burn by exercising or performing daily physical tasks. Prolonged obesity can cause major illnesses or emergencies, such as:   A stroke.  Heart disease.  Diabetes.  Cancer.  Arthritis.  High blood pressure (hypertension).  High cholesterol.  Sleep apnea.  Erectile dysfunction.  Infertility problems. CAUSES   Regularly eating unhealthy foods.  Physical inactivity.  Certain disorders, such as an underactive thyroid (hypothyroidism), Cushing's syndrome, and polycystic ovarian syndrome.  Certain medicines, such as steroids, some depression medicines, and antipsychotics.  Genetics.  Lack of sleep. DIAGNOSIS  A caregiver can diagnose obesity after calculating your BMI. Obesity will be diagnosed if your BMI is 30 or higher.  There are other methods of measuring obesity levels. Some other methods include measuring your skin fold thickness, your waist circumference, and comparing your hip circumference to your waist circumference. TREATMENT  A healthy treatment program includes some or all of the following:  Long-term dietary changes.  Exercise and physical activity.  Behavioral and lifestyle changes.  Medicine only under the supervision of your caregiver. Medicines may help, but only if they are used with diet and exercise programs. An unhealthy treatment program includes:  Fasting.  Fad diets.  Supplements and drugs. These choices do not succeed in long-term weight control.  HOME CARE INSTRUCTIONS   Exercise and perform physical activity as directed by your caregiver. To increase physical activity, try the following:  Use stairs instead of elevators.  Park farther away from store entrances.  Garden, bike, or walk instead of watching television or using the computer.  Eat healthy, low-calorie foods and drinks on a regular basis. Eat more fruits and vegetables. Use low-calorie cookbooks or take healthy cooking classes.  Limit  fast food, sweets, and processed snack foods.  Eat smaller portions.  Keep a daily journal of everything you eat. There are many free websites to help you with this. It may be helpful to measure your foods so you can determine if you are eating the correct portion sizes.  Avoid drinking alcohol. Drink more water and drinks without calories.  Take vitamins and supplements only as recommended by your caregiver.  Weight-loss support groups, Nurse, mental health, counselors, and stress reduction education can also be very helpful. SEEK IMMEDIATE MEDICAL CARE IF:  You have chest pain or tightness.  You have trouble breathing or feel short of breath.  You have weakness or leg numbness.  You feel confused or have trouble talking.  You have sudden changes in your vision. MAKE SURE YOU:  Understand these instructions.  Will watch your condition.  Will get help right away if you are not doing well or get worse. Document Released: 07/24/2004 Document Revised: 12/16/2011 Document Reviewed: 07/23/2011 [facility] Patient Information 2014 [place].

## 2013-09-22 NOTE — Progress Notes (Signed)
Subjective:  Robert Schaefer is a 73 y.o. male who presents for Medicare Annual Wellness Visit and complete physical.  Date of last medicare wellness visit is unknown.  His blood pressure has been controlled at home, today their BP is BP: 130/60 mmHg He does workout, walks daily for 20-22mins. He denies chest pain, shortness of breath, dizziness. Cardiolite 2011 EF 61%, LVH, aortic stenosis mild  He is on cholesterol medication and denies myalgias. His cholesterol is at goal. The cholesterol last visit was:   Lab Results  Component Value Date   CHOL 122 07/05/2013   HDL 55 07/05/2013   LDLCALC 55 07/05/2013   TRIG 60 07/05/2013   CHOLHDL 2.2 07/05/2013   He has been working on diet and exercise for prediabetes, and denies paresthesia of the feet, polydipsia, polyuria and visual disturbances. Last A1C in the office was:  Lab Results  Component Value Date   HGBA1C 5.9* 07/05/2013   Patient is on Vitamin D supplement.   Complains of "liquid" coming up into his mouth, mostly after eating and at night. He has raised the head of his bed without relief, he is not on a PPI or heart burn medication.  He is very compliant with his CPAP but states that he has some claustrophobia with it, he has the nasal canula. He is interested in getting the mouth piece rather than the machine.   Names of Other Physician/Practitioners you currently use: 1. [facility] here for primary care 2. ,Dr. Truman Hayward eye doctor, last visit 11/2012, glasses 3. Dr. Ronnald Ramp  dentist, last visit q 6 months Patient Care Team: [name], MD as PCP - General (Internal Medicine) Missy Sabins, MD as Consulting Physician (Gastroenterology) Jari Pigg, MD as Consulting Physician (Dermatology) Larey Dresser, MD as Consulting Physician (Cardiology) Ailene Rud, MD as Consulting Physician (Urology)   Medication Review: Current Outpatient Prescriptions on File Prior to Visit  Medication Sig  Dispense Refill  . aspirin 81 MG chewable tablet Chew 81 mg by mouth daily.      . Cholecalciferol (VITAMIN D PO) Take 7,000 Int'l Units by mouth daily.      [name] doxazosin (CARDURA) 8 MG tablet 1/4 to 1/2 tablet at bedtime  for prostate and bladder emptying as directed  90 tablet  99  . fluticasone (FLONASE) 50 MCG/ACT nasal spray USE TWO SPRAYS IN EACH NOSTRIL EVERY DAY  16 g  3  . levothyroxine (SYNTHROID, LEVOTHROID) 50 MCG tablet Take 50 mcg by mouth daily before breakfast.      . lisinopril (PRINIVIL,ZESTRIL) 20 MG tablet Take 20 mg by mouth daily.      . simvastatin (ZOCOR) 20 MG tablet Take 20 mg by mouth daily. Takes 1/2 tab daily       No current facility-administered medications on file prior to visit.    Current Problems (verified) Patient Active Problem List   Diagnosis Date Noted  . Unspecified vitamin D deficiency 07/05/2013  . Hyperlipidemia   . Prediabetes   . Asthma   . IBS (irritable bowel syndrome)   . Thyroid disease   . RLS (restless legs syndrome)   . OSA (obstructive sleep apnea)   . AORTIC VALVE DISORDERS 10/01/2009  . CAROTID BRUIT 10/01/2009  . HYPERTENSION 09/28/2009  . COPD 09/28/2009  . GERD 09/28/2009    Screening Tests Health Maintenance  Topic Date Due  . Tetanus/tdap  02/23/1960  . Pneumococcal Polysaccharide Vaccine Age 66 And Over  02/22/2005  .  Influenza Vaccine  01/28/2013  . Colonoscopy  10/31/2019  . Zostavax  Completed    Immunization History  Administered Date(s) Administered  . Zoster 06/30/2005    Preventative care: Last colonoscopy: 2011  Prior vaccinations: TD or Tdap: 2004- DUE  Influenza: declines Pneumococcal: 2006 Shingles/Zostavax: 2007  History reviewed: allergies, current medications, past family history, past medical history, past social history, past surgical history and problem list   Risk Factors: Tobacco History  Substance Use Topics  . Smoking status: Former Smoker    Quit date: 07/05/2005  .  Smokeless tobacco: Never Used  . Alcohol Use: No   He does not smoke.  Patient is a former smoker. Are there smokers in your home (other than you)?  No  Alcohol History  Alcohol Use No   Current alcohol use: none  Caffeine Current caffeine use: coffee 1 /day  Exercise Current exercise habits: walks  Current exercise: walking and yard work  Nutrition/Diet Current diet: in general, a "healthy" diet    Cardiac risk factors: advanced age (older than 58 for men, 43 for women), dyslipidemia, hypertension, male gender and obesity (BMI >= 30 kg/m2).  Depression Screen Nurse depression screen reviewed.  (Note: if answer to either of the following is "Yes", a more complete depression screening is indicated)   Q1: Over the past two weeks, have you felt down, depressed or hopeless? No  Q2: Over the past two weeks, have you felt little interest or pleasure in doing things? No  Have you lost interest or pleasure in daily life? No  Do you often feel hopeless? No  Do you cry easily over simple problems? No  Activities of Daily Living Nurse ADLs screen reviewed.  In your present state of health, do you have any difficulty performing the following activities?:  Driving? No Managing money?  No Feeding yourself? No Getting from bed to chair? No Climbing a flight of stairs? No Preparing food and eating?: No Bathing or showering? No Getting dressed: No Getting to the toilet? No Using the toilet:No Moving around from place to place: No In the past year have you fallen or had a near fall?:No   Are you sexually active?  No  Do you have more than one partner?  No  Vision Difficulties: No  Hearing Difficulties: Yes Do you often ask people to speak up or repeat themselves? Yes Do you experience ringing or noises in your ears? No Do you have difficulty understanding soft or whispered voices? Yes  Cognition  Do you feel that you have a problem with memory?No  Do you often misplace  items? No  Do you feel safe at home?  Yes  Advanced directives Does patient have a Bertrand? No Does patient have a Living Will? No   Objective:     Vision and hearing screens reviewed.   Blood pressure 130/60, pulse 68, temperature 98.8 F (37.1 C), resp. rate 16, height 5\' 6"  (1.676 m), weight 185 lb (83.915 kg). Body mass index is 29.87 kg/(m^2).  General appearance: alert, no distress, WD/WN, male Cognitive Testing  Alert? Yes  Normal Appearance?Yes  Oriented to person? Yes  Place? Yes   Time? Yes  Recall of three objects?  Yes  Can perform simple calculations? Yes  Displays appropriate judgment?Yes  Can read the correct time from a watch face?Yes  HEENT: normocephalic, sclerae anicteric, TMs pearly, nares patent, no discharge or erythema, pharynx normal Oral cavity: MMM, no lesions Neck: supple, no lymphadenopathy, no  thyromegaly, no masses Heart: RRR, normal S1, S2, with 4/6 systolic murmur with radiation to his carotids and abdomen.  Lungs: CTA bilaterally, no wheezes, rhonchi, or rales Abdomen: +bs, soft, obese, non tender, non distended, no masses, no hepatomegaly, no splenomegaly Musculoskeletal: nontender, no swelling, no obvious deformity Extremities: no edema, no cyanosis, no clubbing Pulses: 2+ symmetric, upper and lower extremities, normal cap refill Neurological: alert, oriented x 3, CN2-12 intact, strength normal upper extremities and lower extremities, sensation normal throughout, DTRs 2+ throughout, no cerebellar signs, gait normal Psychiatric: normal affect, behavior normal, pleasant  Breast: left breast with nipple retraction and non mobile irregular mass at 11-12 o clock about 1 inch from nipple with questionable axilla lymphadenopathy  Assessment:   Unspecified vitamin D deficiency- check level  Hyperlipidemia--continue medications, check lipids, decrease fatty foods, increase activity.   Prediabetes-Discussed general issues about  diabetes pathophysiology and management., Educational material distributed., Suggested low cholesterol diet., Encouraged aerobic exercise., Discussed foot care., Reminded to get yearly retinal exam.  Asthma- controlled  IBS (irritable bowel syndrome)- controlled  Thyroid disease--check TSH level, continue medications the same.   RLS (restless legs syndrome)- controlled  OSA (obstructive sleep apnea)- will refer to Dr. Toni Arthurs, patient has been on CPAP for close to a year and will wake up to take it off and has anxiety from it- will try to get mouth piece depending on Hacker  aortic stenosis- no symptoms, will continue to monitor  CAROTID BRUIT- monitor  HYPERTENSION-- continue medications, DASH diet, exercise and monitor at home. Call if greater than 130/80.   COPD- get CXR last one was 2013  GERD- uncontrolled- diet discussed and will put on PPI, may need follow up with Dr. Madilyn Fireman, check Hpylori, and ? If would benefit from reglan in the future   Left nipple retraction with questionable mass, no family history- will get U/S  Plan:   During the course of the visit the patient was educated and counseled about appropriate screening and preventive services including:    Pneumococcal vaccine   Influenza vaccine  Hepatitis B vaccine  Td vaccine  Screening electrocardiogram  Colorectal cancer screening  Diabetes screening  Glaucoma screening  Nutrition counseling   Advanced directives: given information  Screening recommendations, referrals: Vaccinations: Tdap vaccine yes  Influenza vaccine yes Pneumococcal vaccine yes Shingles vaccine no Hep B vaccine no  Nutrition assessed and recommended  Colonoscopy yes Recommended yearly ophthalmology/optometry visit for glaucoma screening and checkup Recommended yearly dental visit for hygiene and checkup Advanced directives - yes  Conditions/risks identified: BMI: Discussed weight loss, diet, and increase physical activity.   Increase physical activity: AHA recommends 150 minutes of physical activity a week.  Medications reviewed  Diabetes is at goal, ACE/ARB therapy: Yes. Urinary Incontinence is an issue: discussed non pharmacology and pharmacology options.- follows with Dr Marcello Fennel.   Fall risk: low- discussed PT, home fall assessment, medications.   Medicare Attestation I have personally reviewed: The patient's medical and social history Their use of alcohol, tobacco or illicit drugs Their current medications and supplements The patient's functional ability including ADLs,fall risks, home safety risks, cognitive, and hearing and visual impairment Diet and physical activities Evidence for depression or mood disorders  The patient's weight, height, BMI, and visual acuity have been recorded in the chart.  I have made referrals, counseling, and provided education to the patient based on review of the above and I have provided the patient with a written personalized care plan for preventive services.  Vicie Mutters, PA-C   09/22/2013

## 2013-09-23 LAB — URINALYSIS, ROUTINE W REFLEX MICROSCOPIC
BILIRUBIN URINE: NEGATIVE
Glucose, UA: NEGATIVE mg/dL
Hgb urine dipstick: NEGATIVE
Ketones, ur: NEGATIVE mg/dL
LEUKOCYTES UA: NEGATIVE
Nitrite: NEGATIVE
PH: 5.5 (ref 5.0–8.0)
Protein, ur: NEGATIVE mg/dL
Specific Gravity, Urine: 1.016 (ref 1.005–1.030)
UROBILINOGEN UA: 1 mg/dL (ref 0.0–1.0)

## 2013-09-23 LAB — MAGNESIUM: Magnesium: 1.8 mg/dL (ref 1.5–2.5)

## 2013-09-23 LAB — MICROALBUMIN / CREATININE URINE RATIO
CREATININE, URINE: 79.6 mg/dL
MICROALB UR: 1.04 mg/dL (ref 0.00–1.89)
MICROALB/CREAT RATIO: 13.1 mg/g (ref 0.0–30.0)

## 2013-09-23 LAB — HEPATIC FUNCTION PANEL
ALBUMIN: 4.3 g/dL (ref 3.5–5.2)
ALT: 18 U/L (ref 0–53)
AST: 17 U/L (ref 0–37)
Alkaline Phosphatase: 45 U/L (ref 39–117)
BILIRUBIN INDIRECT: 0.6 mg/dL (ref 0.2–1.2)
BILIRUBIN TOTAL: 0.8 mg/dL (ref 0.2–1.2)
Bilirubin, Direct: 0.2 mg/dL (ref 0.0–0.3)
TOTAL PROTEIN: 6.5 g/dL (ref 6.0–8.3)

## 2013-09-23 LAB — LIPID PANEL
CHOL/HDL RATIO: 2.4 ratio
CHOLESTEROL: 109 mg/dL (ref 0–200)
HDL: 45 mg/dL (ref >39)
LDL Cholesterol: 49 mg/dL (ref 0–99)
TRIGLYCERIDES: 74 mg/dL (ref <150)
VLDL: 15 mg/dL (ref 0–40)

## 2013-09-23 LAB — BASIC METABOLIC PANEL WITH GFR
BUN: 22 mg/dL (ref 6–23)
CALCIUM: 9.4 mg/dL (ref 8.4–10.5)
CO2: 25 meq/L (ref 19–32)
CREATININE: 1.02 mg/dL (ref 0.50–1.35)
Chloride: 104 mEq/L (ref 96–112)
GFR, EST AFRICAN AMERICAN: 84 mL/min
GFR, EST NON AFRICAN AMERICAN: 73 mL/min
Glucose, Bld: 103 mg/dL — ABNORMAL HIGH (ref 70–99)
POTASSIUM: 4 meq/L (ref 3.5–5.3)
Sodium: 139 mEq/L (ref 135–145)

## 2013-09-23 LAB — TSH: TSH: 1.425 u[IU]/mL (ref 0.350–4.500)

## 2013-09-23 LAB — HEMOGLOBIN A1C
Hgb A1c MFr Bld: 5.9 % — ABNORMAL HIGH (ref <5.7)
Mean Plasma Glucose: 123 mg/dL — ABNORMAL HIGH (ref <117)

## 2013-09-27 ENCOUNTER — Other Ambulatory Visit: Payer: Self-pay

## 2013-09-27 DIAGNOSIS — N63 Unspecified lump in unspecified breast: Secondary | ICD-10-CM

## 2013-10-04 NOTE — Addendum Note (Signed)
Addended by: Vicie Mutters R on: 10/04/2013 08:18 AM   Modules accepted: Orders

## 2013-10-06 ENCOUNTER — Ambulatory Visit
Admission: RE | Admit: 2013-10-06 | Discharge: 2013-10-06 | Disposition: A | Discharge status: Home / Self Care | Payer: Self-pay | Source: Ambulatory Visit | Attending: Physician Assistant | Admitting: Physician Assistant

## 2013-10-06 DIAGNOSIS — N6320 Unspecified lump in the left breast, unspecified quadrant: Secondary | ICD-10-CM

## 2013-10-06 DIAGNOSIS — N63 Unspecified lump in unspecified breast: Secondary | ICD-10-CM

## 2013-10-06 DIAGNOSIS — N6459 Other signs and symptoms in breast: Secondary | ICD-10-CM | POA: Diagnosis not present

## 2013-10-21 DIAGNOSIS — R972 Elevated prostate specific antigen [PSA]: Secondary | ICD-10-CM | POA: Diagnosis not present

## 2013-10-21 DIAGNOSIS — N400 Enlarged prostate without lower urinary tract symptoms: Secondary | ICD-10-CM | POA: Diagnosis not present

## 2013-10-24 ENCOUNTER — Other Ambulatory Visit: Payer: Self-pay | Admitting: Internal Medicine

## 2013-12-04 ENCOUNTER — Other Ambulatory Visit: Payer: Self-pay | Admitting: Internal Medicine

## 2013-12-07 ENCOUNTER — Other Ambulatory Visit: Payer: Self-pay | Admitting: Internal Medicine

## 2013-12-26 ENCOUNTER — Ambulatory Visit (INDEPENDENT_AMBULATORY_CARE_PROVIDER_SITE_OTHER): Payer: Medicare Other | Admitting: Physician Assistant

## 2013-12-26 ENCOUNTER — Encounter: Payer: Self-pay | Admitting: Physician Assistant

## 2013-12-26 VITALS — BP 138/60 | HR 68 | Temp 98.1°F | Resp 16 | Ht 66.0 in | Wt 182.0 lb

## 2013-12-26 DIAGNOSIS — R7303 Prediabetes: Secondary | ICD-10-CM

## 2013-12-26 DIAGNOSIS — I10 Essential (primary) hypertension: Secondary | ICD-10-CM | POA: Diagnosis not present

## 2013-12-26 DIAGNOSIS — E785 Hyperlipidemia, unspecified: Secondary | ICD-10-CM | POA: Diagnosis not present

## 2013-12-26 DIAGNOSIS — E079 Disorder of thyroid, unspecified: Secondary | ICD-10-CM

## 2013-12-26 DIAGNOSIS — Z79899 Other long term (current) drug therapy: Secondary | ICD-10-CM | POA: Diagnosis not present

## 2013-12-26 DIAGNOSIS — K2100 Gastro-esophageal reflux disease with esophagitis, without bleeding: Secondary | ICD-10-CM | POA: Diagnosis not present

## 2013-12-26 DIAGNOSIS — K4090 Unilateral inguinal hernia, without obstruction or gangrene, not specified as recurrent: Secondary | ICD-10-CM

## 2013-12-26 DIAGNOSIS — E559 Vitamin D deficiency, unspecified: Secondary | ICD-10-CM

## 2013-12-26 DIAGNOSIS — R7309 Other abnormal glucose: Secondary | ICD-10-CM | POA: Diagnosis not present

## 2013-12-26 LAB — CBC WITH DIFFERENTIAL/PLATELET
BASOS ABS: 0.1 10*3/uL (ref 0.0–0.1)
Basophils Relative: 1 % (ref 0–1)
EOS ABS: 0.1 10*3/uL (ref 0.0–0.7)
EOS PCT: 2 % (ref 0–5)
HCT: 39.9 % (ref 39.0–52.0)
Hemoglobin: 14 g/dL (ref 13.0–17.0)
Lymphocytes Relative: 24 % (ref 12–46)
Lymphs Abs: 1.2 10*3/uL (ref 0.7–4.0)
MCH: 32 pg (ref 26.0–34.0)
MCHC: 35.1 g/dL (ref 30.0–36.0)
MCV: 91.3 fL (ref 78.0–100.0)
MONO ABS: 0.5 10*3/uL (ref 0.1–1.0)
Monocytes Relative: 10 % (ref 3–12)
Neutro Abs: 3.2 10*3/uL (ref 1.7–7.7)
Neutrophils Relative %: 63 % (ref 43–77)
Platelets: 117 10*3/uL — ABNORMAL LOW (ref 150–400)
RBC: 4.37 MIL/uL (ref 4.22–5.81)
RDW: 14.9 % (ref 11.5–15.5)
WBC: 5.1 10*3/uL (ref 4.0–10.5)

## 2013-12-26 LAB — HEMOGLOBIN A1C
Hgb A1c MFr Bld: 5.8 % — ABNORMAL HIGH (ref <5.7)
MEAN PLASMA GLUCOSE: 120 mg/dL — AB (ref <117)

## 2013-12-26 MED ORDER — LOSARTAN POTASSIUM 50 MG PO TABS: 50.0000 mg | ORAL_TABLET | Freq: Every day | ORAL | Status: DC

## 2013-12-26 NOTE — Patient Instructions (Signed)

## 2013-12-26 NOTE — Progress Notes (Signed)
Assessment and Plan:  Hypertension: Continue medication, monitor blood pressure at home. Continue DASH diet. Cholesterol: Continue diet and exercise. Check cholesterol.  Pre-diabetes-Continue diet and exercise. Check A1C Vitamin D Def- check level and continue medications.  Hypothyroidism-check TSH level, continue medications the same.  Cough/reflux- failed 1 month of protonix will refer to Dr. Amedeo Plenty to rule of esophageal diverticula, will switch lisinopril to losartan.  Right symptomatic retractable inguinal hernia- will send for evaluation.   Continue diet and meds as discussed. Further disposition pending results of labs. OVER 40 minutes of exam, counseling, chart review, referral performed  HPI 73 y.o. male  presents for 3 month follow up with hypertension, hyperlipidemia, prediabetes and vitamin D. His blood pressure has been controlled at home, today their BP is BP: 138/60 mmHg He does workout, walks daily. He denies chest pain, shortness of breath, dizziness.  He is on cholesterol medication and denies myalgias. His cholesterol is at goal. The cholesterol last visit was:   Lab Results  Component Value Date   CHOL 109 09/22/2013   HDL 45 09/22/2013   LDLCALC 49 09/22/2013   TRIG 74 09/22/2013   CHOLHDL 2.4 09/22/2013   He has been working on diet and exercise for prediabetes, and denies paresthesia of the feet, polydipsia and polyuria. Last A1C in the office was:  Lab Results  Component Value Date   HGBA1C 5.9* 09/22/2013   Patient is on Vitamin D supplement.   Lab Results  Component Value Date   VD25OH 18 07/05/2013     He is on thyroid medication. His medication was not changed last visit. Patient denies nervousness, palpitations and weight changes.  Lab Results  Component Value Date   TSH 1.425 09/22/2013  .  He was put on protonix and took it for one month and states it does not help. He continues to wake up at night with undigested food at night and "water" in his mouth, he  has a nonproductive tickle in his throat, worse after eating.  He also states that for a long time he has been having right groin pain with activity but it has gotten worse over the last 2 months, worse with coughing, picking things up, and getting in and out of his truck.   Current Medications:  Current Outpatient Prescriptions on File Prior to Visit  Medication Sig Dispense Refill  . aspirin 81 MG chewable tablet Chew 81 mg by mouth daily.      . Cholecalciferol (VITAMIN D PO) Take 7,000 Int'l Units by mouth daily.      [name] doxazosin (CARDURA) 8 MG tablet 1/4 to 1/2 tablet at bedtime  for prostate and bladder emptying as directed  90 tablet  99  . fluticasone (FLONASE) 50 MCG/ACT nasal spray USE TWO SPRAYS IN EACH NOSTRIL EVERY DAY  16 g  3  . levothyroxine (SYNTHROID, LEVOTHROID) 50 MCG tablet TAKE ONE TABLET BY MOUTH EVERY DAY  90 tablet  1  . lisinopril (PRINIVIL,ZESTRIL) 20 MG tablet TAKE ONE TABLET BY MOUTH EVERY DAY  90 tablet  0  . pantoprazole (PROTONIX) 40 MG tablet Take 1 tablet (40 mg total) by mouth daily.  30 tablet  1  . simvastatin (ZOCOR) 20 MG tablet Take 20 mg by mouth daily. Takes 1/2 tab daily       No current facility-administered medications on file prior to visit.   Medical History:  Past Medical History  Diagnosis Date  . Hypertension   . Hyperlipidemia   . GERD (gastroesophageal  reflux disease)   . Prediabetes   . Asthma   . IBS (irritable bowel syndrome)   . Thyroid disease   . RLS (restless legs syndrome)   . OSA (obstructive sleep apnea)   . Atherosclerosis of aorta     via CXR   Allergies:  Allergies  Allergen Reactions  . Penicillins Anaphylaxis and Swelling  . Sulfa Antibiotics Anaphylaxis and Swelling    Review of Systems: [X]  = complains of  [ ]  = denies  General: Fatigue [ ]  Fever [ ]  Chills [ ]  Weakness [ ]   Insomnia [ ]  Eyes: Redness [ ]  Blurred vision [ ]  Diplopia [ ]   ENT: Congestion [ ]  Sinus Pain [ ]  Post Nasal Drip [ ]  Sore Throat [ ]   Earache [ ]   Cardiac: Chest pain/pressure [ ]  SOB [ ]  Orthopnea [ ]   Palpitations [ ]   Paroxysmal nocturnal dyspnea[ ]  Claudication [ ]  Edema [ ]   Pulmonary: Cough Valu.Nieves ] Wheezing[ ]   SOB [ ]   Snoring [ ]   GI: Nausea [ ]  Vomiting[ ]  Dysphagia[X ] Heartburn[X ] Abdominal pain [ ]  Constipation [ ] ; Diarrhea [ ] ; BRBPR [ ]  Melena[ ]  GU: Hematuria[ ]  Dysuria [ ]  Nocturia[ ]  Urgency [ ]   Hesitancy [ ]  Discharge [ ]  Neuro: Headaches[ ]  Vertigo[ ]  Paresthesias[ ]  Spasm [ ]  Speech changes [ ]  Incoordination [ ]   Ortho: Arthritis [ ]  Joint pain [ ]  Muscle pain [ ]  Joint swelling [ ]  Back Pain [ ]  Skin:  Rash [ ]   Pruritis [ ]  Change in skin lesion [ ]   Psych: Depression[ ]  Anxiety[ ]  Confusion [ ]  Memory loss [ ]   Heme/Lypmh: Bleeding [ ]  Bruising [ ]  Enlarged lymph nodes [ ]   Endocrine: Visual blurring [ ]  Paresthesia [ ]  Polyuria [ ]  Polydypsea [ ]    Heat/cold intolerance [ ]  Hypoglycemia [ ]   Family history- Review and unchanged Social history- Review and unchanged Physical Exam: BP 138/60  Pulse 68  Temp(Src) 98.1 F (36.7 C)  Resp 16  Ht 5\' 6"  (1.676 m)  Wt 182 lb (82.555 kg)  BMI 29.39 kg/m2 Wt Readings from Last 3 Encounters:  12/26/13 182 lb (82.555 kg)  09/22/13 185 lb (83.915 kg)  07/05/13 189 lb (85.73 kg)   General Appearance: Well nourished, in no apparent distress. Eyes: PERRLA, EOMs, conjunctiva no swelling or erythema Sinuses: No Frontal/maxillary tenderness ENT/Mouth: Ext aud canals clear, TMs without erythema, bulging. No erythema, swelling, or exudate on post pharynx.  Tonsils not swollen or erythematous. Hearing normal.  Neck: Supple, thyroid normal.  Respiratory: Respiratory effort normal, BS equal bilaterally without rales, rhonchi, wheezing or stridor.  Cardio: RRR with no MRGs. Brisk peripheral pulses without edema.  Abdomen: Soft, + BS. + right retractable inguinal hernia Non tender, no guarding, rebound, masses. Lymphatics: Non tender without lymphadenopathy.   Musculoskeletal: Full ROM, 5/5 strength, normal gait.  Skin: Warm, dry without rashes, lesions, ecchymosis.  Neuro: Cranial nerves intact. Normal muscle tone, no cerebellar symptoms. Sensation intact.  Psych: Awake and oriented X 3, normal affect, Insight and Judgment appropriate.    Vicie Mutters 11:03 AM

## 2013-12-27 LAB — TSH: TSH: 1.678 u[IU]/mL (ref 0.350–4.500)

## 2013-12-27 LAB — HEPATIC FUNCTION PANEL
ALBUMIN: 4.1 g/dL (ref 3.5–5.2)
ALT: 10 U/L (ref 0–53)
AST: 12 U/L (ref 0–37)
Alkaline Phosphatase: 52 U/L (ref 39–117)
Bilirubin, Direct: 0.2 mg/dL (ref 0.0–0.3)
Indirect Bilirubin: 0.7 mg/dL (ref 0.2–1.2)
TOTAL PROTEIN: 6.4 g/dL (ref 6.0–8.3)
Total Bilirubin: 0.9 mg/dL (ref 0.2–1.2)

## 2013-12-27 LAB — LIPID PANEL
Cholesterol: 112 mg/dL (ref 0–200)
HDL: 41 mg/dL (ref >39)
LDL CALC: 58 mg/dL (ref 0–99)
TRIGLYCERIDES: 66 mg/dL (ref <150)
Total CHOL/HDL Ratio: 2.7 Ratio
VLDL: 13 mg/dL (ref 0–40)

## 2013-12-27 LAB — BASIC METABOLIC PANEL WITH GFR
BUN: 16 mg/dL (ref 6–23)
CALCIUM: 9.5 mg/dL (ref 8.4–10.5)
CO2: 27 mEq/L (ref 19–32)
CREATININE: 0.91 mg/dL (ref 0.50–1.35)
Chloride: 107 mEq/L (ref 96–112)
GFR, Est Non African American: 83 mL/min
GLUCOSE: 104 mg/dL — AB (ref 70–99)
POTASSIUM: 4.2 meq/L (ref 3.5–5.3)
Sodium: 142 mEq/L (ref 135–145)

## 2013-12-27 LAB — MAGNESIUM: MAGNESIUM: 1.9 mg/dL (ref 1.5–2.5)

## 2013-12-27 LAB — HELICOBACTER PYLORI ABS-IGG+IGA, BLD
H Pylori IgG: 0.54 {ISR}
HELICOBACTER PYLORI AB, IGA: 7 U/mL (ref <9.0)

## 2013-12-27 LAB — INSULIN, FASTING: Insulin fasting, serum: 24 u[IU]/mL (ref 3–28)

## 2014-01-09 ENCOUNTER — Ambulatory Visit (INDEPENDENT_AMBULATORY_CARE_PROVIDER_SITE_OTHER): Payer: Medicare Other | Admitting: Surgery

## 2014-01-09 ENCOUNTER — Encounter (INDEPENDENT_AMBULATORY_CARE_PROVIDER_SITE_OTHER): Payer: Self-pay | Admitting: Surgery

## 2014-01-09 VITALS — BP 136/80 | HR 66 | Temp 98.0°F | Resp 18 | Ht 68.0 in | Wt 179.0 lb

## 2014-01-09 DIAGNOSIS — K4090 Unilateral inguinal hernia, without obstruction or gangrene, not specified as recurrent: Secondary | ICD-10-CM

## 2014-01-09 NOTE — Progress Notes (Signed)
Patient ID: Robert Schaefer, male   DOB: [birth year], 73 y.o.   MRN: [id number]  Chief Complaint  Patient presents with  . New Evaluation    Hernia    HPI Robert Schaefer is a 73 y.o. male.  Patient is sent at the request of Robert Pinto, MD For bulge in right groin. Present for 2 months. Causing burning sensation when walking. Made worse with lifting. Made better with rest. No radiation. Moderate intensity. HPI  Past Medical History  Diagnosis Date  . Hypertension   . Hyperlipidemia   . GERD (gastroesophageal reflux disease)   . Prediabetes   . Asthma   . IBS (irritable bowel syndrome)   . Thyroid disease   . RLS (restless legs syndrome)   . OSA (obstructive sleep apnea)   . Atherosclerosis of aorta     via CXR    Past Surgical History  Procedure Laterality Date  . Cataract extraction Bilateral     Family History  Problem Relation Age of Onset  . Heart attack Mother   . Stroke Father   . Asthma Father     Social History History  Substance Use Topics  . Smoking status: Former Smoker    Quit date: 07/05/2005  . Smokeless tobacco: Never Used  . Alcohol Use: No    Allergies  Allergen Reactions  . Penicillins Anaphylaxis and Swelling  . Sulfa Antibiotics Anaphylaxis and Swelling    Current Outpatient Prescriptions  Medication Sig Dispense Refill  . aspirin 81 MG chewable tablet Chew 81 mg by mouth daily.      . Cholecalciferol (VITAMIN D PO) Take 7,000 Int'l Units by mouth daily.      [name] doxazosin (CARDURA) 8 MG tablet 1/4 to 1/2 tablet at bedtime  for prostate and bladder emptying as directed  90 tablet  99  . fluticasone (FLONASE) 50 MCG/ACT nasal spray USE TWO SPRAYS IN EACH NOSTRIL EVERY DAY  16 g  3  . levothyroxine (SYNTHROID, LEVOTHROID) 50 MCG tablet TAKE ONE TABLET BY MOUTH EVERY DAY  90 tablet  1  . losartan (COZAAR) 50 MG tablet Take 1 tablet (50 mg total) by mouth daily.  90 tablet  3  . pantoprazole (PROTONIX) 40 MG tablet Take 1 tablet (40 mg total)  by mouth daily.  30 tablet  1  . simvastatin (ZOCOR) 20 MG tablet Take 20 mg by mouth daily. Takes 1/2 tab daily       No current facility-administered medications for this visit.    Review of Systems Review of Systems  Constitutional: Negative for fever, chills and unexpected weight change.  HENT: Negative for congestion, hearing loss, sore throat, trouble swallowing and voice change.   Eyes: Negative for visual disturbance.  Respiratory: Negative for cough and wheezing.   Cardiovascular: Negative for chest pain, palpitations and leg swelling.  Gastrointestinal: Negative for nausea, vomiting, abdominal pain, diarrhea, constipation, blood in stool, abdominal distention, anal bleeding and rectal pain.  Genitourinary: Negative for hematuria and difficulty urinating.  Musculoskeletal: Negative for arthralgias.  Skin: Negative for rash and wound.  Neurological: Negative for seizures, syncope, weakness and headaches.  Hematological: Negative for adenopathy. Does not bruise/bleed easily.  Psychiatric/Behavioral: Negative for confusion.    Blood pressure 136/80, pulse 66, temperature 98 F (36.7 C), resp. rate 18, height 5\' 8"  (1.727 m), weight 179 lb (81.194 kg).  Physical Exam Physical Exam  Constitutional: He is oriented to person, place, and time. He appears well-developed and well-nourished.  HENT:  Head: Normocephalic and  atraumatic.  Eyes: Pupils are equal, round, and reactive to light. No scleral icterus.  Neck: Normal range of motion.  Cardiovascular: Regular rhythm.   Pulmonary/Chest: Effort normal.  Abdominal: He exhibits no distension. There is no tenderness. A hernia is present. Hernia confirmed positive in the right inguinal area.  Genitourinary: Testes normal and penis normal.  Musculoskeletal: Normal range of motion.  Neurological: He is oriented to person, place, and time.  Skin: Skin is warm and dry.  Psychiatric: He has a normal mood and affect. His behavior is  normal. Judgment and thought content normal.    Data Reviewed Primary care office note  Assessment    Right inguinal hernia reducible symptomatic    Plan    Recommend repair right inguinal hernia with mesh.The risk of hernia repair include bleeding,  Infection,   Recurrence of the hernia,  Mesh use, chronic pain,  Organ injury,  Bowel injury,  Bladder injury,   nerve injury with numbness around the incision,  Death,  and worsening of preexisting  medical problems.  The alternatives to surgery have been discussed as well..  Long term expectations of both operative and non operative treatments have been discussed.   The patient agrees to proceed.    Discussed laparoscopic options as well. Contrasted this with open repair.   Robert Schaefer A. 01/09/2014, 11:15 AM

## 2014-01-09 NOTE — Patient Instructions (Signed)

## 2014-01-13 ENCOUNTER — Other Ambulatory Visit: Payer: Self-pay | Admitting: Internal Medicine

## 2014-01-16 ENCOUNTER — Other Ambulatory Visit: Payer: Self-pay | Admitting: Internal Medicine

## 2014-01-17 DIAGNOSIS — R1314 Dysphagia, pharyngoesophageal phase: Secondary | ICD-10-CM | POA: Diagnosis not present

## 2014-01-17 DIAGNOSIS — K219 Gastro-esophageal reflux disease without esophagitis: Secondary | ICD-10-CM | POA: Diagnosis not present

## 2014-02-15 DIAGNOSIS — K222 Esophageal obstruction: Secondary | ICD-10-CM | POA: Diagnosis not present

## 2014-02-15 DIAGNOSIS — R1310 Dysphagia, unspecified: Secondary | ICD-10-CM | POA: Diagnosis not present

## 2014-02-17 ENCOUNTER — Encounter (HOSPITAL_BASED_OUTPATIENT_CLINIC_OR_DEPARTMENT_OTHER): Payer: Self-pay | Admitting: *Deleted

## 2014-02-17 ENCOUNTER — Encounter (HOSPITAL_BASED_OUTPATIENT_CLINIC_OR_DEPARTMENT_OTHER)
Admission: RE | Admit: 2014-02-17 | Discharge: 2014-02-17 | Disposition: A | Discharge status: Home / Self Care | Payer: Medicare Other | Source: Ambulatory Visit | Attending: Surgery | Admitting: Surgery

## 2014-02-17 DIAGNOSIS — K4090 Unilateral inguinal hernia, without obstruction or gangrene, not specified as recurrent: Secondary | ICD-10-CM | POA: Diagnosis not present

## 2014-02-17 DIAGNOSIS — Z01818 Encounter for other preprocedural examination: Secondary | ICD-10-CM | POA: Insufficient documentation

## 2014-02-17 LAB — COMPREHENSIVE METABOLIC PANEL
ALT: 15 U/L (ref 0–53)
ANION GAP: 11 (ref 5–15)
AST: 17 U/L (ref 0–37)
Albumin: 3.8 g/dL (ref 3.5–5.2)
Alkaline Phosphatase: 55 U/L (ref 39–117)
BUN: 17 mg/dL (ref 6–23)
CALCIUM: 9.7 mg/dL (ref 8.4–10.5)
CO2: 26 mEq/L (ref 19–32)
Chloride: 105 mEq/L (ref 96–112)
Creatinine, Ser: 1.04 mg/dL (ref 0.50–1.35)
GFR calc non Af Amer: 69 mL/min — ABNORMAL LOW (ref >90)
GFR, EST AFRICAN AMERICAN: 80 mL/min — AB (ref >90)
Glucose, Bld: 99 mg/dL (ref 70–99)
Potassium: 4.2 mEq/L (ref 3.7–5.3)
Sodium: 142 mEq/L (ref 137–147)
TOTAL PROTEIN: 6.3 g/dL (ref 6.0–8.3)
Total Bilirubin: 0.7 mg/dL (ref 0.3–1.2)

## 2014-02-17 LAB — CBC WITH DIFFERENTIAL/PLATELET
Basophils Absolute: 0 10*3/uL (ref 0.0–0.1)
Basophils Relative: 0 % (ref 0–1)
EOS ABS: 0.1 10*3/uL (ref 0.0–0.7)
EOS PCT: 2 % (ref 0–5)
HCT: 40.8 % (ref 39.0–52.0)
HEMOGLOBIN: 13.7 g/dL (ref 13.0–17.0)
Lymphocytes Relative: 30 % (ref 12–46)
Lymphs Abs: 1.4 10*3/uL (ref 0.7–4.0)
MCH: 31.9 pg (ref 26.0–34.0)
MCHC: 33.6 g/dL (ref 30.0–36.0)
MCV: 95.1 fL (ref 78.0–100.0)
MONOS PCT: 9 % (ref 3–12)
Monocytes Absolute: 0.4 10*3/uL (ref 0.1–1.0)
Neutro Abs: 2.7 10*3/uL (ref 1.7–7.7)
Neutrophils Relative %: 58 % (ref 43–77)
PLATELETS: 100 10*3/uL — AB (ref 150–400)
RBC: 4.29 MIL/uL (ref 4.22–5.81)
RDW: 14.5 % (ref 11.5–15.5)
WBC: 4.6 10*3/uL (ref 4.0–10.5)

## 2014-02-17 NOTE — Progress Notes (Signed)
Pt has sleep apnea-has a cpap-too much sinus congestion to use-he cannot use mask-he will bring all meds and overnight bag in case he has to stay overnight-to come in for labs Saw dr Johnsie Cancel 2011 and 12 for stress and echo-normal-mild aortic stenosis-mild mitral regurg,stress normal

## 2014-02-23 ENCOUNTER — Ambulatory Visit (HOSPITAL_BASED_OUTPATIENT_CLINIC_OR_DEPARTMENT_OTHER)
Admission: RE | Admit: 2014-02-23 | Discharge: 2014-02-24 | Disposition: A | Discharge status: Home / Self Care | Payer: Medicare Other | Source: Ambulatory Visit | Attending: Surgery | Admitting: Surgery

## 2014-02-23 ENCOUNTER — Ambulatory Visit (HOSPITAL_BASED_OUTPATIENT_CLINIC_OR_DEPARTMENT_OTHER): Payer: Medicare Other | Admitting: Anesthesiology

## 2014-02-23 ENCOUNTER — Encounter (HOSPITAL_BASED_OUTPATIENT_CLINIC_OR_DEPARTMENT_OTHER): Payer: Medicare Other | Admitting: Anesthesiology

## 2014-02-23 ENCOUNTER — Encounter (HOSPITAL_BASED_OUTPATIENT_CLINIC_OR_DEPARTMENT_OTHER): Payer: Self-pay

## 2014-02-23 ENCOUNTER — Encounter (HOSPITAL_BASED_OUTPATIENT_CLINIC_OR_DEPARTMENT_OTHER)
Admission: RE | Disposition: A | Discharge status: Home / Self Care | Payer: Self-pay | Source: Ambulatory Visit | Attending: Surgery

## 2014-02-23 DIAGNOSIS — J449 Chronic obstructive pulmonary disease, unspecified: Secondary | ICD-10-CM | POA: Diagnosis not present

## 2014-02-23 DIAGNOSIS — Z79899 Other long term (current) drug therapy: Secondary | ICD-10-CM | POA: Insufficient documentation

## 2014-02-23 DIAGNOSIS — I359 Nonrheumatic aortic valve disorder, unspecified: Secondary | ICD-10-CM | POA: Insufficient documentation

## 2014-02-23 DIAGNOSIS — G8918 Other acute postprocedural pain: Secondary | ICD-10-CM | POA: Diagnosis not present

## 2014-02-23 DIAGNOSIS — G4733 Obstructive sleep apnea (adult) (pediatric): Secondary | ICD-10-CM

## 2014-02-23 DIAGNOSIS — K4090 Unilateral inguinal hernia, without obstruction or gangrene, not specified as recurrent: Secondary | ICD-10-CM | POA: Diagnosis not present

## 2014-02-23 DIAGNOSIS — Z882 Allergy status to sulfonamides status: Secondary | ICD-10-CM | POA: Insufficient documentation

## 2014-02-23 DIAGNOSIS — J4489 Other specified chronic obstructive pulmonary disease: Secondary | ICD-10-CM | POA: Diagnosis not present

## 2014-02-23 DIAGNOSIS — Z7982 Long term (current) use of aspirin: Secondary | ICD-10-CM | POA: Diagnosis not present

## 2014-02-23 DIAGNOSIS — I10 Essential (primary) hypertension: Secondary | ICD-10-CM | POA: Diagnosis not present

## 2014-02-23 DIAGNOSIS — E785 Hyperlipidemia, unspecified: Secondary | ICD-10-CM | POA: Insufficient documentation

## 2014-02-23 DIAGNOSIS — Z880 Allergy status to penicillin: Secondary | ICD-10-CM | POA: Insufficient documentation

## 2014-02-23 DIAGNOSIS — K219 Gastro-esophageal reflux disease without esophagitis: Secondary | ICD-10-CM | POA: Diagnosis not present

## 2014-02-23 DIAGNOSIS — IMO0002 Reserved for concepts with insufficient information to code with codable children: Secondary | ICD-10-CM | POA: Diagnosis not present

## 2014-02-23 DIAGNOSIS — R109 Unspecified abdominal pain: Secondary | ICD-10-CM | POA: Diagnosis not present

## 2014-02-23 DIAGNOSIS — Z87891 Personal history of nicotine dependence: Secondary | ICD-10-CM | POA: Diagnosis not present

## 2014-02-23 HISTORY — DX: Presence of spectacles and contact lenses: Z97.3

## 2014-02-23 HISTORY — PX: INGUINAL HERNIA REPAIR: SHX194

## 2014-02-23 HISTORY — PX: INSERTION OF MESH: SHX5868

## 2014-02-23 HISTORY — DX: Chronic obstructive pulmonary disease, unspecified: J44.9

## 2014-02-23 SURGERY — INSERTION OF MESH
Anesthesia: General | Laterality: Right

## 2014-02-23 MED ORDER — GLYCOPYRROLATE 0.2 MG/ML IJ SOLN
INTRAMUSCULAR | Status: DC | PRN
  Administered 2014-02-23: 0.2 mg via INTRAVENOUS

## 2014-02-23 MED ORDER — OXYCODONE HCL 5 MG PO TABS
5.0000 mg | ORAL_TABLET | ORAL | Status: DC | PRN
  Administered 2014-02-23: 10 mg via ORAL
  Administered 2014-02-23: 5 mg via ORAL
  Administered 2014-02-23: 10 mg via ORAL
  Filled 2014-02-23 (×2): qty 2
  Filled 2014-02-23: qty 1

## 2014-02-23 MED ORDER — OXYCODONE-ACETAMINOPHEN 5-325 MG PO TABS: 1.0000 | ORAL_TABLET | ORAL | Status: DC | PRN

## 2014-02-23 MED ORDER — FENTANYL CITRATE 0.05 MG/ML IJ SOLN
INTRAMUSCULAR | Status: AC
  Filled 2014-02-23: qty 2

## 2014-02-23 MED ORDER — ONDANSETRON HCL 4 MG/2ML IJ SOLN
INTRAMUSCULAR | Status: DC | PRN
  Administered 2014-02-23: 4 mg via INTRAVENOUS

## 2014-02-23 MED ORDER — HYDROMORPHONE HCL PF 1 MG/ML IJ SOLN: 0.2500 mg | INTRAMUSCULAR | Status: DC | PRN

## 2014-02-23 MED ORDER — PROPOFOL 10 MG/ML IV BOLUS
INTRAVENOUS | Status: AC
  Filled 2014-02-23: qty 20

## 2014-02-23 MED ORDER — FENTANYL CITRATE 0.05 MG/ML IJ SOLN
INTRAMUSCULAR | Status: AC
  Filled 2014-02-23: qty 6

## 2014-02-23 MED ORDER — SODIUM CHLORIDE 0.9 % IJ SOLN: 3.0000 mL | INTRAMUSCULAR | Status: DC | PRN

## 2014-02-23 MED ORDER — ACETAMINOPHEN 325 MG PO TABS: 650.0000 mg | ORAL_TABLET | ORAL | Status: DC | PRN

## 2014-02-23 MED ORDER — OXYCODONE HCL 5 MG PO TABS: 5.0000 mg | ORAL_TABLET | Freq: Once | ORAL | Status: AC | PRN

## 2014-02-23 MED ORDER — LIDOCAINE HCL (CARDIAC) 20 MG/ML IV SOLN
INTRAVENOUS | Status: DC | PRN
  Administered 2014-02-23: 50 mg via INTRAVENOUS

## 2014-02-23 MED ORDER — OXYCODONE HCL 5 MG/5ML PO SOLN: 5.0000 mg | Freq: Once | ORAL | Status: AC | PRN

## 2014-02-23 MED ORDER — SODIUM CHLORIDE 0.9 % IV SOLN
250.0000 mL | INTRAVENOUS | Status: DC | PRN
  Administered 2014-02-23: 500 mL via INTRAVENOUS

## 2014-02-23 MED ORDER — SUCCINYLCHOLINE CHLORIDE 20 MG/ML IJ SOLN
INTRAMUSCULAR | Status: DC | PRN
  Administered 2014-02-23: 100 mg via INTRAVENOUS

## 2014-02-23 MED ORDER — LACTATED RINGERS IV SOLN
INTRAVENOUS | Status: DC
  Administered 2014-02-23 (×3): via INTRAVENOUS

## 2014-02-23 MED ORDER — BUPIVACAINE-EPINEPHRINE (PF) 0.5% -1:200000 IJ SOLN
INTRAMUSCULAR | Status: DC | PRN
Start: 2014-02-23 — End: 2014-02-23
  Administered 2014-02-23: 25 mL via PERINEURAL

## 2014-02-23 MED ORDER — ACETAMINOPHEN 500 MG PO TABS
1000.0000 mg | ORAL_TABLET | Freq: Four times a day (QID) | ORAL | Status: DC
  Administered 2014-02-24 (×2): 1000 mg via ORAL
  Filled 2014-02-23 (×2): qty 2

## 2014-02-23 MED ORDER — CLINDAMYCIN PHOSPHATE 300 MG/50ML IV SOLN
300.0000 mg | Freq: Once | INTRAVENOUS | Status: AC
  Administered 2014-02-23: 300 mg via INTRAVENOUS

## 2014-02-23 MED ORDER — CHLORHEXIDINE GLUCONATE 4 % EX LIQD: 1.0000 "application " | Freq: Once | CUTANEOUS | Status: DC

## 2014-02-23 MED ORDER — SODIUM CHLORIDE 0.9 % IJ SOLN
3.0000 mL | Freq: Two times a day (BID) | INTRAMUSCULAR | Status: DC
  Administered 2014-02-23: 3 mL via INTRAVENOUS

## 2014-02-23 MED ORDER — FENTANYL CITRATE 0.05 MG/ML IJ SOLN
INTRAMUSCULAR | Status: DC | PRN
  Administered 2014-02-23 (×3): 25 ug via INTRAVENOUS

## 2014-02-23 MED ORDER — CLINDAMYCIN PHOSPHATE 300 MG/50ML IV SOLN
INTRAVENOUS | Status: AC
  Filled 2014-02-23: qty 50

## 2014-02-23 MED ORDER — BUPIVACAINE-EPINEPHRINE 0.25% -1:200000 IJ SOLN
INTRAMUSCULAR | Status: DC | PRN
  Administered 2014-02-23: 7 mL

## 2014-02-23 MED ORDER — MIDAZOLAM HCL 2 MG/2ML IJ SOLN
INTRAMUSCULAR | Status: AC
  Filled 2014-02-23: qty 2

## 2014-02-23 MED ORDER — BUPIVACAINE-EPINEPHRINE (PF) 0.25% -1:200000 IJ SOLN
INTRAMUSCULAR | Status: AC
  Filled 2014-02-23: qty 30

## 2014-02-23 MED ORDER — ONDANSETRON HCL 4 MG/2ML IJ SOLN: 4.0000 mg | Freq: Once | INTRAMUSCULAR | Status: AC | PRN

## 2014-02-23 MED ORDER — MIDAZOLAM HCL 2 MG/2ML IJ SOLN
1.0000 mg | INTRAMUSCULAR | Status: DC | PRN
  Administered 2014-02-23: 2 mg via INTRAVENOUS

## 2014-02-23 MED ORDER — FENTANYL CITRATE 0.05 MG/ML IJ SOLN
50.0000 ug | INTRAMUSCULAR | Status: DC | PRN
  Administered 2014-02-23: 100 ug via INTRAVENOUS

## 2014-02-23 MED ORDER — DEXAMETHASONE SODIUM PHOSPHATE 4 MG/ML IJ SOLN
INTRAMUSCULAR | Status: DC | PRN
  Administered 2014-02-23: 8 mg via INTRAVENOUS

## 2014-02-23 MED ORDER — PROPOFOL 10 MG/ML IV BOLUS
INTRAVENOUS | Status: DC | PRN
  Administered 2014-02-23: 150 mg via INTRAVENOUS
  Administered 2014-02-23: 20 mg via INTRAVENOUS

## 2014-02-23 MED ORDER — ACETAMINOPHEN 650 MG RE SUPP: 650.0000 mg | RECTAL | Status: DC | PRN

## 2014-02-23 MED ORDER — MORPHINE SULFATE 2 MG/ML IJ SOLN: 2.0000 mg | INTRAMUSCULAR | Status: DC | PRN

## 2014-02-23 SURGICAL SUPPLY — 53 items
ADH SKN CLS APL DERMABOND .7 (GAUZE/BANDAGES/DRESSINGS) ×1
BLADE CLIPPER SURG (BLADE) ×2 IMPLANT
BLADE SURG 10 STRL SS (BLADE) ×3 IMPLANT
BLADE SURG 15 STRL LF DISP TIS (BLADE) ×1 IMPLANT
BLADE SURG 15 STRL SS (BLADE) ×3
CANISTER SUCT 1200ML W/VALVE (MISCELLANEOUS) ×3 IMPLANT
CHLORAPREP W/TINT 26ML (MISCELLANEOUS) ×3 IMPLANT
COVER MAYO STAND STRL (DRAPES) ×3 IMPLANT
COVER TABLE BACK 60X90 (DRAPES) ×3 IMPLANT
DECANTER SPIKE VIAL GLASS SM (MISCELLANEOUS) ×3 IMPLANT
DERMABOND ADVANCED (GAUZE/BANDAGES/DRESSINGS) ×2
DERMABOND ADVANCED .7 DNX12 (GAUZE/BANDAGES/DRESSINGS) ×1 IMPLANT
DRAPE LAPAROTOMY TRNSV 102X78 (DRAPE) ×3 IMPLANT
DRAPE UTILITY XL STRL (DRAPES) ×3 IMPLANT
ELECT COATED BLADE 2.86 ST (ELECTRODE) ×3 IMPLANT
ELECT REM PT RETURN 9FT ADLT (ELECTROSURGICAL) ×3
ELECTRODE REM PT RTRN 9FT ADLT (ELECTROSURGICAL) ×1 IMPLANT
GLOVE BIOGEL PI IND STRL 6.5 (GLOVE) IMPLANT
GLOVE BIOGEL PI IND STRL 8 (GLOVE) ×1 IMPLANT
GLOVE BIOGEL PI INDICATOR 6.5 (GLOVE) ×2
GLOVE BIOGEL PI INDICATOR 8 (GLOVE) ×2
GLOVE ECLIPSE 6.5 STRL STRAW (GLOVE) ×2 IMPLANT
GLOVE ECLIPSE 8.0 STRL XLNG CF (GLOVE) ×3 IMPLANT
GLOVE EXAM NITRILE MD LF STRL (GLOVE) ×2 IMPLANT
GOWN STRL REUS W/ TWL LRG LVL3 (GOWN DISPOSABLE) ×2 IMPLANT
GOWN STRL REUS W/TWL LRG LVL3 (GOWN DISPOSABLE) ×6
MESH HERNIA SYS ULTRAPRO LRG (Mesh General) ×2 IMPLANT
NDL HYPO 25X1 1.5 SAFETY (NEEDLE) ×1 IMPLANT
NEEDLE HYPO 22GX1.5 SAFETY (NEEDLE) IMPLANT
NEEDLE HYPO 25X1 1.5 SAFETY (NEEDLE) ×3 IMPLANT
NS IRRIG 1000ML POUR BTL (IV SOLUTION) IMPLANT
PACK BASIN DAY SURGERY FS (CUSTOM PROCEDURE TRAY) ×3 IMPLANT
PENCIL BUTTON HOLSTER BLD 10FT (ELECTRODE) ×3 IMPLANT
SLEEVE SCD COMPRESS KNEE MED (MISCELLANEOUS) ×2 IMPLANT
STAPLER VISISTAT 35W (STAPLE) IMPLANT
SUT MON AB 4-0 PC3 18 (SUTURE) ×3 IMPLANT
SUT NOVA 0 T19/GS 22DT (SUTURE) ×4 IMPLANT
SUT NOVA NAB DX-16 0-1 5-0 T12 (SUTURE) IMPLANT
SUT NOVA NAB GS-22 2 0 T19 (SUTURE) IMPLANT
SUT PROLENE 0 CT 1 30 (SUTURE) IMPLANT
SUT SILK 3 0 SH 30 (SUTURE) IMPLANT
SUT VIC AB 0 SH 27 (SUTURE) ×2 IMPLANT
SUT VIC AB 2-0 SH 27 (SUTURE) ×3
SUT VIC AB 2-0 SH 27XBRD (SUTURE) ×1 IMPLANT
SUT VIC AB 3-0 SH 27 (SUTURE)
SUT VIC AB 3-0 SH 27X BRD (SUTURE) IMPLANT
SUT VICRYL 3-0 CR8 SH (SUTURE) ×2 IMPLANT
SYR CONTROL 10ML LL (SYRINGE) ×3 IMPLANT
TOWEL OR 17X24 6PK STRL BLUE (TOWEL DISPOSABLE) ×3 IMPLANT
TOWEL OR NON WOVEN STRL DISP B (DISPOSABLE) ×1 IMPLANT
TUBE CONNECTING 20'X1/4 (TUBING) ×1
TUBE CONNECTING 20X1/4 (TUBING) ×2 IMPLANT
YANKAUER SUCT BULB TIP NO VENT (SUCTIONS) ×3 IMPLANT

## 2014-02-23 NOTE — Anesthesia Preprocedure Evaluation (Signed)
Anesthesia Evaluation  Patient identified by MRN, date of birth, ID band Patient awake    Reviewed: Allergy & Precautions  Airway Mallampati: I TM Distance: >3 FB Neck ROM: Full    Dental  (+) Teeth Intact, Partial Upper   Pulmonary former smoker,  breath sounds clear to auscultation        Cardiovascular hypertension, Pt. on medications + Valvular Problems/Murmurs AS and AI Rhythm:Regular Rate:Normal     Neuro/Psych    GI/Hepatic GERD-  Medicated and Controlled,  Endo/Other    Renal/GU      Musculoskeletal   Abdominal   Peds  Hematology   Anesthesia Other Findings   Reproductive/Obstetrics                           Anesthesia Physical Anesthesia Plan  ASA: III  Anesthesia Plan: General   Post-op Pain Management: MAC Combined w/ Regional for Post-op pain   Induction: Intravenous  Airway Management Planned: LMA  Additional Equipment:   Intra-op Plan:   Post-operative Plan: Extubation in OR  Informed Consent: I have reviewed the patients History and Physical, chart, labs and discussed the procedure including the risks, benefits and alternatives for the proposed anesthesia with the patient or authorized representative who has indicated his/her understanding and acceptance.   Dental advisory given  Plan Discussed with: CRNA, Anesthesiologist and Surgeon  Anesthesia Plan Comments:         Anesthesia Quick Evaluation

## 2014-02-23 NOTE — Transfer of Care (Signed)
Immediate Anesthesia Transfer of Care Note  Patient: Robert Schaefer  Procedure(s) Performed: Procedure(s): INSERTION OF MESH (Right) HERNIA REPAIR INGUINAL ADULT (Right)  Patient Location: PACU  Anesthesia Type:GA combined with regional for post-op pain  Level of Consciousness: awake, sedated, patient cooperative and confused  Airway & Oxygen Therapy: Patient Spontanous Breathing and Patient connected to face mask oxygen  Post-op Assessment: Report given to PACU RN and Post -op Vital signs reviewed and stable  Post vital signs: Reviewed and stable  Complications: No apparent anesthesia complications

## 2014-02-23 NOTE — H&P (Signed)
Transplants    None    Demographics Robert Schaefer turned 68 on the 26th Robert Schaefer 73 year old male  [place] [address] [phone] Robert Schaefer) [phone] Robert Schaefer)  Comm Pref: None [address] [place] [address] [phone] (H) [phone] (W)   Problem ListUnprioritized  HYPERTENSION  AORTIC VALVE DISORDERS  COPD  GERD  CAROTID BRUIT  Hyperlipidemia  Prediabetes  Asthma  IBS (irritable bowel syndrome)  Thyroid disease  RLS (restless legs syndrome)  OSA (obstructive sleep apnea)  Unspecified vitamin D deficiency  Right inguinal hernia  Significant History/Details  Smoking: Former Smoker (Quit Date:07/05/2005)  Smokeless Tobacco: Never Used  Alcohol: No  No open orders  Preferred Language: English  Dialysis HistoryNone   Specialty CommentsEditShow AllReport07/13/15:PT SIGNED PHI FOR Robert Schaefer 07/15/1942 01-11-14 pt op sx schd 6-65-99 @ [facility] no precert required ip/op mcr primary.kas, tvp 01/24/14 LMVM for pt re-surgery dates.kas DOS 02/23/14 - TC-[facility]-OP-R IH repair w mesh/kas 01/31/14 [id number]   MedicationsLong-Term  lisinopril (PRINIVIL,ZESTRIL) 20 MG tablet    aspirin 81 MG chewable tablet    Cholecalciferol (VITAMIN D PO)   doxazosin (CARDURA) 8 MG tablet   fluticasone (FLONASE) 50 MCG/ACT nasal spray   levothyroxine (SYNTHROID, LEVOTHROID) 50 MCG tablet   losartan (COZAAR) 50 MG tablet   pantoprazole (PROTONIX) 40 MG tablet    predniSONE (DELTASONE) 10 MG tablet   simvastatin (ZOCOR) 20 MG tablet     Preferred Labs   None   Transplant-Related Biopsies (11 years) ** None **  Patient Blood Type (50 years)   None                                 Recent Visits (Maximum of 10 visits)Date Type Provider Description  02/23/2014 Surgery Rexanna Louthan A., MD   01/09/2014 Office Visit Sehar Sedano A., MD Right Inguinal Hernia (Primary Dx)         My Last Outpatient Progress NoteStatus Last Edited Encounter Date  Signed Mon Jan 09, 2014 11:19 AM EDT 01/09/2014  Patient ID: Robert Schaefer, male   DOB: [birth year], 73 y.o.   MRN: [id number]    Chief Complaint   Patient presents with   .  New Evaluation       Hernia      HPI Robert Schaefer is a 73 y.o. male.  Patient is sent at the request of [name], MD For bulge in right groin. Present for 2 months. Causing burning sensation when walking. Made worse with lifting. Made better with rest. No radiation. Moderate intensity. HPI    Past Medical History   Diagnosis  Date   .  Hypertension     .  Hyperlipidemia     .  GERD (gastroesophageal reflux disease)     .  Prediabetes     .  Asthma     .  IBS (irritable bowel syndrome)     .  Thyroid disease     .  RLS (restless legs syndrome)     .  OSA (obstructive sleep apnea)     .  Atherosclerosis of aorta         via CXR       Past Surgical History   Procedure  Laterality  Date   .  Cataract extraction  Bilateral         Family History   Problem  Relation  Age of Onset   .  Heart attack  Mother     .  Stroke  Father     .  Asthma  Father        Social History History   Substance Use Topics   .  Smoking status:  Former Smoker       Quit date:  07/05/2005   .  Smokeless tobacco:  Never Used   .  Alcohol Use:  No       Allergies   Allergen  Reactions   .  Penicillins  Anaphylaxis and Swelling   .  Sulfa Antibiotics  Anaphylaxis and Swelling       Current Outpatient Prescriptions   Medication  Sig  Dispense  Refill   .  aspirin 81 MG chewable tablet  Chew 81 mg by mouth daily.         .  Cholecalciferol (VITAMIN D PO)  Take 7,000 Int'l Units by mouth daily.         [name]  doxazosin (CARDURA) 8 MG tablet  1/4 to 1/2 tablet at bedtime  for prostate and bladder emptying as directed   90 tablet   99   .  fluticasone (FLONASE) 50 MCG/ACT nasal spray  USE TWO SPRAYS IN EACH NOSTRIL EVERY DAY   16 g   3   .  levothyroxine (SYNTHROID, LEVOTHROID) 50 MCG tablet  TAKE ONE TABLET BY MOUTH EVERY DAY   90 tablet    1   .  losartan (COZAAR) 50 MG tablet  Take 1 tablet (50 mg total) by mouth daily.   90 tablet   3   .  pantoprazole (PROTONIX) 40 MG tablet  Take 1 tablet (40 mg total) by mouth daily.   30 tablet   1   .  simvastatin (ZOCOR) 20 MG tablet  Take 20 mg by mouth daily. Takes 1/2 tab daily             No current facility-administered medications for this visit.      Review of Systems Review of Systems  Constitutional: Negative for fever, chills and unexpected weight change.  HENT: Negative for congestion, hearing loss, sore throat, trouble swallowing and voice change.   Eyes: Negative for visual disturbance.  Respiratory: Negative for cough and wheezing.   Cardiovascular: Negative for chest pain, palpitations and leg swelling.  Gastrointestinal: Negative for nausea, vomiting, abdominal pain, diarrhea, constipation, blood in stool, abdominal distention, anal bleeding and rectal pain.  Genitourinary: Negative for hematuria and difficulty urinating.  Musculoskeletal: Negative for arthralgias.  Skin: Negative for rash and wound.  Neurological: Negative for seizures, syncope, weakness and headaches.  Hematological: Negative for adenopathy. Does not bruise/bleed easily.  Psychiatric/Behavioral: Negative for confusion.    Blood pressure 136/80, pulse 66, temperature 98 F (36.7 C), resp. rate 18, height 5\' 8"  (1.727 m), weight 179 lb (81.194 kg).   Physical Exam Physical Exam  Constitutional: He is oriented to person, place, and time. He appears well-developed and well-nourished.  HENT:   Head: Normocephalic and atraumatic.  Eyes: Pupils are equal, round, and reactive to light. No scleral icterus.  Neck: Normal range of motion.  Cardiovascular: Regular rhythm.   Pulmonary/Chest: Effort normal.  Abdominal: He exhibits no distension. There is no tenderness. A hernia is present. Hernia confirmed positive in the right inguinal area.  Genitourinary: Testes normal and penis normal.   Musculoskeletal: Normal range of motion.  Neurological: He is oriented to person, place, and time.  Skin: Skin is warm and dry.  Psychiatric: He has a normal mood and affect. His behavior is normal. Judgment and thought content normal.    Data Reviewed Primary care office note   Assessment Right inguinal hernia reducible symptomatic   Plan Recommend repair right inguinal hernia with mesh.The risk of hernia repair include bleeding,  Infection,   Recurrence of the hernia,  Mesh use, chronic pain,  Organ injury,  Bowel injury,  Bladder injury,   nerve injury with numbness around the incision,  Death,  and worsening of preexisting  medical problems.  The alternatives to surgery have been discussed as well..  Long term expectations of both operative and non operative treatments have been discussed.   The patient agrees to proceed. Discussed laparoscopic options as well. Contrasted this with open repair.     Desirre Eickhoff A.

## 2014-02-23 NOTE — Anesthesia Procedure Notes (Addendum)
Anesthesia Regional Block:  TAP block  Pre-Anesthetic Checklist: ,, timeout performed, Correct Patient, Correct Site, Correct Laterality, Correct Procedure, Correct Position, site marked, Risks and benefits discussed,  Surgical consent,  Pre-op evaluation,  At surgeon's request and post-op pain management  Laterality: Right and Lower  Prep: chloraprep       Needles:  Injection technique: Single-shot  Needle Type: Echogenic Needle     Needle Length: 9cm 9 cm Needle Gauge: 21 and 21 G    Additional Needles:  Procedures: ultrasound guided (picture in chart) TAP block Narrative:  Start time: 02/23/2014 9:06 AM End time: 02/23/2014 9:12 AM Injection made incrementally with aspirations every 5 mL.  Performed by: Personally  Anesthesiologist: Lorrene Reid, MD   Procedure Name: Intubation Date/Time: 02/23/2014 9:24 AM Performed by: Lyndee Leo Pre-anesthesia Checklist: Patient identified, Emergency Drugs available, Suction available and Patient being monitored Patient Re-evaluated:Patient Re-evaluated prior to inductionOxygen Delivery Method: Circle System Utilized Preoxygenation: Pre-oxygenation with 100% oxygen Intubation Type: IV induction Ventilation: Mask ventilation without difficulty Laryngoscope Size: Mac and 3 Grade View: Grade III Tube type: Oral Tube size: 8.0 mm Number of attempts: 1 Airway Equipment and Method: stylet and oral airway Placement Confirmation: ETT inserted through vocal cords under direct vision,  positive ETCO2 and breath sounds checked- equal and bilateral Secured at: 22 cm Tube secured with: Tape Dental Injury: Teeth and Oropharynx as per pre-operative assessment

## 2014-02-23 NOTE — Op Note (Signed)
Right InguinalHernia, Open, Procedure Note with [facility] system   Indications: The patient presented with a history of a right, reducible  Inguinal hernia.The risk of hernia repair include bleeding,  Infection,   Recurrence of the hernia,  Mesh use, chronic pain,  Organ injury,  Bowel injury,  Bladder injury,   nerve injury with numbness around the incision,  Death,  and worsening of preexisting  medical problems.  The alternatives to surgery have been discussed as well..  Long term expectations of both operative and non operative treatments have been discussed.   The patient agrees to proceed.hernia.    Pre-operative Diagnosis: right reducible inguinal hernia  Post-operative Diagnosis: same  Surgeon: Erroll Luna A.   Assistants: OR staff  Anesthesia: General endotracheal anesthesia, Local anesthesia 0.25.% bupivacaine, with epinephrine and TEP block per anesthesia  ASA Class: 3  Procedure Details  The patient was seen again in the Holding Room. The risks, benefits, complications, treatment options, and expected outcomes were discussed with the patient. The possibilities of reaction to medication, pulmonary aspiration, perforation of viscus, bleeding, recurrent infection, the need for additional procedures, and development of a complication requiring transfusion or further operation were discussed with the patient and/or family. There was concurrence with the proposed plan, and informed consent was obtained. The site of surgery was properly noted/marked. The patient was taken to the Operating Room, identified as Robert Schaefer, and the procedure verified as hernia repair. A Time Out was held and the above information confirmed.  The patient was placed in the supine position and underwent induction of anesthesia, the lower abdomen and groin was prepped and draped in the standard fashion, and 0.25% Marcaine with epinephrine was used to anesthetize the skin over the mid-portion of the inguinal canal. A  transverse incision was made. Dissection was carried through the soft tissue to expose the inguinal canal and inguinal ligament along its lower edge. The external oblique fascia was split along the course of its fibers, exposing the inguinal canal. The cord and nerve were looped using a Penrose drain and reflected out of the field. The defect was exposed and a piece of prolene hernia system ultrapro mesh was and placed into the inditect defect and deployed. Interupted 2-0 novafil suture and 0 vicryl  was then used  to repair the defect, with the suture being sewn from the pubic tubercle inferiorly and superiorly along the canal to a level just beyond the internal ring. The mesh was split to allow passage of the cord and nerve into the canal without entrapment.  The ilioinguinal was trapped under the mesh under tension and was divided to prevent post operative neuralgia. The contents were then returned to canal and the external oblique fashion was then closed in a continuous fashion using 3-0 Vicryl suture taking care not to cause entrapment. Scarpa's layer closed with 3 0 vicryl and 4 0 monocryl used to close the skin.  Dermabond used for dressing.  Instrument, sponge, and needle counts were correct prior to closure and at the conclusion of the case.  Findings: Hernia as above  Estimated Blood Loss: Minimal         Drains: None         Total IV Fluids: 400 mL         Specimens: none               Complications: None; patient tolerated the procedure well.         Disposition: PACU - hemodynamically stable.  Condition: stable

## 2014-02-23 NOTE — Interval H&P Note (Signed)
History and Physical Interval Note:  02/23/2014 8:24 AM  [name]  has presented today for surgery, with the diagnosis of Right Incisonal Hernia   The various methods of treatment have been discussed with the patient and family. After consideration of risks, benefits and other options for treatment, the patient has consented to  [facility] as a surgical intervention .  The patient's history has been reviewed, patient examined, no change in status, stable for surgery.  I have reviewed the patient's chart and labs.  Questions were answered to the patient's satisfaction.     Ugochi Henzler A.

## 2014-02-23 NOTE — Anesthesia Postprocedure Evaluation (Signed)
  Anesthesia Post-op Note  Patient: Robert Schaefer  Procedure(s) Performed: Procedure(s): INSERTION OF MESH (Right) HERNIA REPAIR INGUINAL ADULT (Right)  Patient Location: PACU  Anesthesia Type: General   Level of Consciousness: awake, alert  and oriented  Airway and Oxygen Therapy: Patient Spontanous Breathing  Post-op Pain: none  Post-op Assessment: Post-op Vital signs reviewed  Post-op Vital Signs: Reviewed  Last Vitals:  Filed Vitals:   02/23/14 1100  BP: 154/44  Pulse: 68  Temp:   Resp: 14    Complications: No apparent anesthesia complications

## 2014-02-23 NOTE — Discharge Instructions (Signed)
Call your surgeon if you experience:   1.  Fever over 101.0. 2.  Inability to urinate. 3.  Nausea and/or vomiting. 4.  Extreme swelling or bruising at the surgical site. 5.  Continued bleeding from the incision. 6.  Increased pain, redness or drainage from the incision. 7.  Problems related to your pain medication. 8. Any change in color, movement and/or sensation 9. Any problems and/or concerns

## 2014-02-23 NOTE — Progress Notes (Signed)
Assisted Dr. Crews with right, ultrasound guided, transabdominal plane block. Side rails up, monitors on throughout procedure. See vital signs in flow sheet. Tolerated Procedure well. 

## 2014-02-24 ENCOUNTER — Encounter (HOSPITAL_BASED_OUTPATIENT_CLINIC_OR_DEPARTMENT_OTHER): Payer: Self-pay | Admitting: Surgery

## 2014-03-10 ENCOUNTER — Encounter (INDEPENDENT_AMBULATORY_CARE_PROVIDER_SITE_OTHER): Payer: Medicare Other | Admitting: Surgery

## 2014-04-10 DIAGNOSIS — R972 Elevated prostate specific antigen [PSA]: Secondary | ICD-10-CM | POA: Diagnosis not present

## 2014-04-11 ENCOUNTER — Encounter: Payer: Self-pay | Admitting: Internal Medicine

## 2014-04-11 ENCOUNTER — Ambulatory Visit (INDEPENDENT_AMBULATORY_CARE_PROVIDER_SITE_OTHER): Payer: Medicare Other | Admitting: Internal Medicine

## 2014-04-11 VITALS — BP 138/64 | HR 60 | Temp 98.2°F | Resp 16 | Ht 66.0 in | Wt 182.0 lb

## 2014-04-11 DIAGNOSIS — I10 Essential (primary) hypertension: Secondary | ICD-10-CM | POA: Diagnosis not present

## 2014-04-11 DIAGNOSIS — E785 Hyperlipidemia, unspecified: Secondary | ICD-10-CM

## 2014-04-11 DIAGNOSIS — R7309 Other abnormal glucose: Secondary | ICD-10-CM

## 2014-04-11 DIAGNOSIS — Z79899 Other long term (current) drug therapy: Secondary | ICD-10-CM | POA: Diagnosis not present

## 2014-04-11 DIAGNOSIS — E559 Vitamin D deficiency, unspecified: Secondary | ICD-10-CM | POA: Diagnosis not present

## 2014-04-11 DIAGNOSIS — R7303 Prediabetes: Secondary | ICD-10-CM

## 2014-04-11 DIAGNOSIS — Z23 Encounter for immunization: Secondary | ICD-10-CM | POA: Diagnosis not present

## 2014-04-11 LAB — CBC WITH DIFFERENTIAL/PLATELET
Basophils Absolute: 0.1 10*3/uL (ref 0.0–0.1)
Basophils Relative: 1 % (ref 0–1)
Eosinophils Absolute: 0.1 10*3/uL (ref 0.0–0.7)
Eosinophils Relative: 2 % (ref 0–5)
HEMATOCRIT: 41.8 % (ref 39.0–52.0)
Hemoglobin: 14.5 g/dL (ref 13.0–17.0)
LYMPHS ABS: 0.8 10*3/uL (ref 0.7–4.0)
LYMPHS PCT: 16 % (ref 12–46)
MCH: 31.7 pg (ref 26.0–34.0)
MCHC: 34.7 g/dL (ref 30.0–36.0)
MCV: 91.3 fL (ref 78.0–100.0)
MONO ABS: 0.3 10*3/uL (ref 0.1–1.0)
Monocytes Relative: 5 % (ref 3–12)
Neutro Abs: 4 10*3/uL (ref 1.7–7.7)
Neutrophils Relative %: 76 % (ref 43–77)
Platelets: 109 10*3/uL — ABNORMAL LOW (ref 150–400)
RBC: 4.58 MIL/uL (ref 4.22–5.81)
RDW: 14.9 % (ref 11.5–15.5)
WBC: 5.3 10*3/uL (ref 4.0–10.5)

## 2014-04-11 NOTE — Patient Instructions (Signed)

## 2014-04-11 NOTE — Progress Notes (Signed)
Patient ID: Robert Schaefer, male   DOB: [birth year], 73 y.o.   MRN: [id number]   This very nice 73 y.o.male presents for follow up with Hypertension, Hyperlipidemia, Pre-Diabetes and Vitamin D Deficiency.    Patient is treated for HTN since 1995 & BP has been controlled at home. Today's BP: 138/64 mmHg. Patient has had no complaints of any cardiac type chest pain, palpitations, dyspnea/orthopnea/PND, dizziness, claudication, or dependent edema.   Hyperlipidemia is controlled with diet & meds. Patient denies myalgias or other med SE's. Last Lipids were at goal as below: Lab Results  Component Value Date   CHOL 112 12/26/2013   HDL 41 12/26/2013   LDLCALC 58 12/26/2013   TRIG 66 12/26/2013   CHOLHDL 2.7 12/26/2013    Also, the patient has history of  PreDiabetes with A1c 6.1% in 2002 and has had no symptoms of reactive hypoglycemia, diabetic polys, paresthesias or visual blurring.  Last A1c was  5.8 % on 12/26/2013.     Patient still has ongoing problems with prostatism, esp. If he forgets his Uroxatral.  Further, the patient also has history of Vitamin D Deficiency and supplements vitamin D without any suspected side-effects. Last vitamin D was 83 on 07/05/2013.    Medication List   alfuzosin 10 MG 24 hr tablet  Commonly known as:  UROXATRAL  Take 10 mg by mouth every evening.     aspirin 81 MG chewable tablet  Chew 81 mg by mouth daily.     fluticasone 50 MCG/ACT nasal spray  Commonly known as:  FLONASE  USE TWO SPRAYS IN EACH NOSTRIL EVERY DAY     levothyroxine 50 MCG tablet  Commonly known as:  SYNTHROID, LEVOTHROID  TAKE ONE TABLET BY MOUTH EVERY DAY     lisinopril 20 MG tablet  Commonly known as:  PRINIVIL,ZESTRIL  Take 20 mg by mouth daily. On this until finished bottle, then start on losartan     omeprazole 20 MG capsule  Commonly known as:  PRILOSEC  Take 20 mg by mouth daily.     oxyCODONE-acetaminophen 5-325 MG per tablet  Commonly known as:  ROXICET  Take 1 tablet by mouth  every 4 (four) hours as needed.     predniSONE 10 MG tablet  Commonly known as:  DELTASONE  take one tablet every other day     simvastatin 20 MG tablet  Commonly known as:  ZOCOR  Take 20 mg by mouth daily. Takes 1/2 tab daily     VITAMIN D PO  Take 7,000 Int'l Units by mouth daily.     Allergies  Allergen Reactions  . Penicillins Anaphylaxis and Swelling  . Sulfa Antibiotics Anaphylaxis and Swelling   PMHx:   Past Medical History  Diagnosis Date  . Hypertension   . Hyperlipidemia   . GERD (gastroesophageal reflux disease)   . Prediabetes   . Asthma   . IBS (irritable bowel syndrome)   . Thyroid disease   . RLS (restless legs syndrome)   . Atherosclerosis of aorta     via CXR  . OSA (obstructive sleep apnea)     has a cpap-canot use it  . COPD (chronic obstructive pulmonary disease)   . Wears glasses   . Wears partial dentures     top  . Seasonal allergies   . Arthritis    Immunization History  Administered Date(s) Administered  . DT 09/22/2013  . Influenza, High Dose Seasonal PF 04/11/2014  . Zoster 06/30/2005   Past Surgical History  Procedure Laterality Date  . Cataract extraction Bilateral   . Colonoscopy    . Upper gi endoscopy    . Pilonidal cyst / sinus excision  2000    spine  . Insertion of mesh Right 02/23/2014    Procedure: INSERTION OF MESH;  Surgeon: Joyice Faster. Cornett, MD;  Location: [place] [facility];  Service: General;  Laterality: Right;  . Inguinal hernia repair Right 02/23/2014    Procedure: HERNIA REPAIR INGUINAL ADULT;  Surgeon: Joyice Faster. Cornett, MD;  Location: [facility];  Service: General;  Laterality: Right;   FHx:    Reviewed / unchanged  SHx:    Reviewed / unchanged  Systems Review:  Constitutional: Denies fever, chills, wt changes, headaches, insomnia, fatigue, night sweats, change in appetite. Eyes: Denies redness, blurred vision, diplopia, discharge, itchy, watery eyes.  ENT: Denies discharge,  congestion, post nasal drip, epistaxis, sore throat, earache, hearing loss, dental pain, tinnitus, vertigo, sinus pain, snoring.  CV: Denies chest pain, palpitations, irregular heartbeat, syncope, dyspnea, diaphoresis, orthopnea, PND, claudication or edema. Respiratory: denies cough, dyspnea, DOE, pleurisy, hoarseness, laryngitis, wheezing.  Gastrointestinal: Denies dysphagia, odynophagia, heartburn, reflux, water brash, abdominal pain or cramps, nausea, vomiting, bloating, diarrhea, constipation, hematemesis, melena, hematochezia  or hemorrhoids. Genitourinary: Denies dysuria, frequency, urgency, nocturia, hesitancy, discharge, hematuria or flank pain. Musculoskeletal: Denies arthralgias, myalgias, stiffness, jt. swelling, pain, limping or strain/sprain.  Skin: Denies pruritus, rash, hives, warts, acne, eczema or change in skin lesion(s). Neuro: No weakness, tremor, incoordination, spasms, paresthesia or pain. Psychiatric: Denies confusion, memory loss or sensory loss. Endo: Denies change in weight, skin or hair change.  Heme/Lymph: No excessive bleeding, bruising or enlarged lymph nodes.  Exam:  BP 138/64  Pulse 60  Temp(Src) 98.2 F (36.8 C) (Temporal)  Resp 16  Ht 5\' 6"  (1.676 m)  Wt 182 lb (82.555 kg)  BMI 29.39 kg/m2  Appears well nourished and in no distress. Eyes: PERRLA, EOMs, conjunctiva no swelling or erythema. Sinuses: No frontal/maxillary tenderness ENT/Mouth: EAC's clear, TM's nl w/o erythema, bulging. Nares clear w/o erythema, swelling, exudates. Oropharynx clear without erythema or exudates. Oral hygiene is good. Tongue normal, non obstructing. Hearing intact.  Neck: Supple. Thyroid nl. Car 2+/2+ without bruits, nodes or JVD. Chest: Respirations nl with BS clear & equal w/o rales, rhonchi, wheezing or stridor.  Cor: Heart sounds normal w/ regular rate and rhythm without sig. murmurs, gallops, clicks, or rubs. Peripheral pulses normal and equal  without edema.  Abdomen:  Soft & bowel sounds normal. Non-tender w/o guarding, rebound, hernias, masses, or organomegaly.  Lymphatics: Unremarkable.  Musculoskeletal: Full ROM all peripheral extremities, joint stability, 5/5 strength, and normal gait.  Skin: Warm, dry without exposed rashes, lesions or ecchymosis apparent.  Neuro: Cranial nerves intact, reflexes equal bilaterally. Sensory-motor testing grossly intact. Tendon reflexes grossly intact.  Pysch: Alert & oriented x 3.  Insight and judgement nl & appropriate. No ideations.  Assessment and Plan:  1. Hypertension - Continue monitor blood pressure at home. Continue diet/meds same.  2. Hyperlipidemia - Continue diet/meds, exercise,& lifestyle modifications. Continue monitor periodic cholesterol/liver & renal functions   3. Pre-Diabetes - Continue diet, exercise, lifestyle modifications. Monitor appropriate labs.  4. Vitamin D Deficiency - Continue supplementation.  5. BPH/ Prostatism/elev PSA   Recommended regular exercise, BP monitoring, weight control, and discussed med and SE's. Recommended labs to assess and monitor clinical status. Further disposition pending results of labs.

## 2014-04-12 LAB — HEPATIC FUNCTION PANEL
ALK PHOS: 45 U/L (ref 39–117)
ALT: 12 U/L (ref 0–53)
AST: 14 U/L (ref 0–37)
Albumin: 4 g/dL (ref 3.5–5.2)
BILIRUBIN INDIRECT: 0.9 mg/dL (ref 0.2–1.2)
Bilirubin, Direct: 0.3 mg/dL (ref 0.0–0.3)
TOTAL PROTEIN: 6.2 g/dL (ref 6.0–8.3)
Total Bilirubin: 1.2 mg/dL (ref 0.2–1.2)

## 2014-04-12 LAB — TSH: TSH: 1.71 u[IU]/mL (ref 0.350–4.500)

## 2014-04-12 LAB — LIPID PANEL
Cholesterol: 106 mg/dL (ref 0–200)
HDL: 45 mg/dL (ref >39)
LDL Cholesterol: 43 mg/dL (ref 0–99)
Total CHOL/HDL Ratio: 2.4 Ratio
Triglycerides: 89 mg/dL (ref <150)
VLDL: 18 mg/dL (ref 0–40)

## 2014-04-12 LAB — BASIC METABOLIC PANEL WITH GFR
BUN: 20 mg/dL (ref 6–23)
CHLORIDE: 105 meq/L (ref 96–112)
CO2: 24 meq/L (ref 19–32)
CREATININE: 1.13 mg/dL (ref 0.50–1.35)
Calcium: 9.7 mg/dL (ref 8.4–10.5)
GFR, Est African American: 74 mL/min
GFR, Est Non African American: 64 mL/min
Glucose, Bld: 142 mg/dL — ABNORMAL HIGH (ref 70–99)
POTASSIUM: 4 meq/L (ref 3.5–5.3)
Sodium: 140 mEq/L (ref 135–145)

## 2014-04-12 LAB — INSULIN, FASTING: INSULIN FASTING, SERUM: 31.9 u[IU]/mL — AB (ref 2.0–19.6)

## 2014-04-12 LAB — HEMOGLOBIN A1C
Hgb A1c MFr Bld: 5.9 % — ABNORMAL HIGH (ref <5.7)
MEAN PLASMA GLUCOSE: 123 mg/dL — AB (ref <117)

## 2014-04-12 LAB — MAGNESIUM: Magnesium: 1.8 mg/dL (ref 1.5–2.5)

## 2014-04-12 LAB — VITAMIN D 25 HYDROXY (VIT D DEFICIENCY, FRACTURES): Vit D, 25-Hydroxy: 84 ng/mL (ref 30–89)

## 2014-04-28 ENCOUNTER — Other Ambulatory Visit: Payer: Self-pay | Admitting: Internal Medicine

## 2014-06-26 ENCOUNTER — Other Ambulatory Visit: Payer: Self-pay | Admitting: Physician Assistant

## 2014-06-27 ENCOUNTER — Other Ambulatory Visit: Payer: Self-pay | Admitting: Physician Assistant

## 2014-07-17 ENCOUNTER — Encounter: Payer: Self-pay | Admitting: Physician Assistant

## 2014-07-17 ENCOUNTER — Ambulatory Visit (INDEPENDENT_AMBULATORY_CARE_PROVIDER_SITE_OTHER): Payer: Medicare Other | Admitting: Physician Assistant

## 2014-07-17 VITALS — BP 128/70 | HR 76 | Temp 97.9°F | Resp 16 | Ht 68.0 in | Wt 183.0 lb

## 2014-07-17 DIAGNOSIS — I10 Essential (primary) hypertension: Secondary | ICD-10-CM

## 2014-07-17 DIAGNOSIS — E785 Hyperlipidemia, unspecified: Secondary | ICD-10-CM | POA: Diagnosis not present

## 2014-07-17 DIAGNOSIS — J441 Chronic obstructive pulmonary disease with (acute) exacerbation: Secondary | ICD-10-CM

## 2014-07-17 DIAGNOSIS — Z79899 Other long term (current) drug therapy: Secondary | ICD-10-CM | POA: Diagnosis not present

## 2014-07-17 DIAGNOSIS — E079 Disorder of thyroid, unspecified: Secondary | ICD-10-CM | POA: Diagnosis not present

## 2014-07-17 DIAGNOSIS — R51 Headache: Secondary | ICD-10-CM

## 2014-07-17 DIAGNOSIS — R42 Dizziness and giddiness: Secondary | ICD-10-CM

## 2014-07-17 DIAGNOSIS — R05 Cough: Secondary | ICD-10-CM

## 2014-07-17 DIAGNOSIS — I359 Nonrheumatic aortic valve disorder, unspecified: Secondary | ICD-10-CM | POA: Diagnosis not present

## 2014-07-17 DIAGNOSIS — R7303 Prediabetes: Secondary | ICD-10-CM

## 2014-07-17 DIAGNOSIS — E559 Vitamin D deficiency, unspecified: Secondary | ICD-10-CM

## 2014-07-17 DIAGNOSIS — R7309 Other abnormal glucose: Secondary | ICD-10-CM | POA: Diagnosis not present

## 2014-07-17 DIAGNOSIS — R519 Headache, unspecified: Secondary | ICD-10-CM

## 2014-07-17 DIAGNOSIS — R059 Cough, unspecified: Secondary | ICD-10-CM

## 2014-07-17 LAB — CBC WITH DIFFERENTIAL/PLATELET
BASOS PCT: 0 % (ref 0–1)
Basophils Absolute: 0 10*3/uL (ref 0.0–0.1)
EOS PCT: 1 % (ref 0–5)
Eosinophils Absolute: 0.1 10*3/uL (ref 0.0–0.7)
HCT: 41.3 % (ref 39.0–52.0)
Hemoglobin: 14.4 g/dL (ref 13.0–17.0)
Lymphocytes Relative: 9 % — ABNORMAL LOW (ref 12–46)
Lymphs Abs: 0.5 10*3/uL — ABNORMAL LOW (ref 0.7–4.0)
MCH: 31.8 pg (ref 26.0–34.0)
MCHC: 34.9 g/dL (ref 30.0–36.0)
MCV: 91.2 fL (ref 78.0–100.0)
MONO ABS: 0.2 10*3/uL (ref 0.1–1.0)
MONOS PCT: 3 % (ref 3–12)
MPV: 11.1 fL (ref 8.6–12.4)
NEUTROS PCT: 87 % — AB (ref 43–77)
Neutro Abs: 5.3 10*3/uL (ref 1.7–7.7)
Platelets: 125 10*3/uL — ABNORMAL LOW (ref 150–400)
RBC: 4.53 MIL/uL (ref 4.22–5.81)
RDW: 14.3 % (ref 11.5–15.5)
WBC: 6.1 10*3/uL (ref 4.0–10.5)

## 2014-07-17 NOTE — Progress Notes (Signed)
Assessment and Plan:  Hypertension: Continue medication, monitor blood pressure at home. Continue DASH diet.  Reminder to go to the ER if any CP, SOB, nausea, dizziness, severe HA, changes vision/speech, left arm numbness and tingling, and jaw pain. Cholesterol: Continue diet and exercise. Check cholesterol.  Pre-diabetes-Continue diet and exercise. Check A1C Vitamin D Def- check level and continue medications.  Right inguinal tenderness- suggest following up with surgeon, get on stool softeners, miralax, treat constipation Cough- declines switching ACE, treat GERD, treat COPD.  Hypothyroidism-check TSH level, continue medications the same, reminded to take on an empty stomach 30-40mins before food.  Dizziness/vision change/headaches- normal neuro exam- get Carotid US, decrease alpha blocker, see eye doctor, Go to the ER if any CP, SOB, nausea, dizziness, severe HA, changes vision/speech Palpitations/dizzines/SOB- follow up with cardio for holter monitor and echo rule out afib/worsening AS.   Continue diet and meds as discussed. Further disposition pending results of labs. Follow up 1 month.  OVER 40 minutes of exam, counseling, chart review, referral performed  HPI 74 y.o. male  presents for 3 month follow up with hypertension, hyperlipidemia, prediabetes and vitamin D. His blood pressure has been controlled at home, today their BP is BP: 128/70 mmHg He does workout, walks daily.  He denies chest pain.  He has been having dizziness for the last few months, can happen at any time, one day last week he was walking down the hall when he felt light headed, "pulled to the right", and has to sit. He has occ palpitations. He also had "wavy vision" 1 week ago with pain at the top of his head for a week. His memory has not been as good. He has not seen his eye doctor in a over a year, he had an echo 2013 that showed mild AS and AR.  He is on cholesterol medication, simvastatin 20 and denies myalgias. His  cholesterol is at goal. The cholesterol last visit was:   Lab Results  Component Value Date   CHOL 106 04/11/2014   HDL 45 04/11/2014   LDLCALC 43 04/11/2014   TRIG 89 04/11/2014   CHOLHDL 2.4 04/11/2014  He  has been working on diet and exercise for prediabetes, and denies paresthesia of the feet, polydipsia, polyuria and visual disturbances. Last A1C in the office was:  Lab Results  Component Value Date   HGBA1C 5.9* 04/11/2014  Patient is on Vitamin D supplement.   Lab Results  Component Value Date   VD25OH 84 04/11/2014  He has OSA and is on CPAP now. He is on ACE and was suppose to switch to losartan but he does not have medicare part D and states that it was 100 dollars so he states he will stay on the ACE.  He has COPD and is on prednisone 10mg  every other day for his breathing, and he states this helps with his breathing.  He is on thyroid medication. His medication was not changed last visit.  Lab Results  Component Value Date   TSH 1.710 04/11/2014    Current Medications:  Current Outpatient Prescriptions on File Prior to Visit  Medication Sig Dispense Refill  . alfuzosin (UROXATRAL) 10 MG 24 hr tablet Take 10 mg by mouth every evening.    [name] aspirin 81 MG chewable tablet Chew 81 mg by mouth daily.    . Cholecalciferol (VITAMIN D PO) Take 7,000 Int'l Units by mouth daily.    . fluticasone (FLONASE) 50 MCG/ACT nasal spray USE TWO SPRAYS IN [facility]  NOSTRIL EVERY DAY 16 g 3  . levothyroxine (SYNTHROID, LEVOTHROID) 50 MCG tablet TAKE ONE TABLET BY MOUTH ONCE DAILY 90 tablet 0  . lisinopril (PRINIVIL,ZESTRIL) 20 MG tablet TAKE ONE TABLET BY MOUTH ONCE DAILY 90 tablet 0  . oxyCODONE-acetaminophen (ROXICET) 5-325 MG per tablet Take 1 tablet by mouth every 4 (four) hours as needed. 30 tablet 0  . predniSONE (DELTASONE) 10 MG tablet take one tablet every other day    . simvastatin (ZOCOR) 20 MG tablet TAKE ONE TABLET BY MOUTH EVERY DAY 90 tablet 0   No current facility-administered  medications on file prior to visit.   Medical History:  Past Medical History  Diagnosis Date  . Hypertension   . Hyperlipidemia   . GERD (gastroesophageal reflux disease)   . Prediabetes   . Asthma   . IBS (irritable bowel syndrome)   . Thyroid disease   . RLS (restless legs syndrome)   . Atherosclerosis of aorta     via CXR  . OSA (obstructive sleep apnea)     has a cpap-canot use it  . COPD (chronic obstructive pulmonary disease)   . Wears glasses   . Wears partial dentures     top  . Seasonal allergies   . Arthritis    Allergies:  Allergies  Allergen Reactions  . Penicillins Anaphylaxis and Swelling  . Sulfa Antibiotics Anaphylaxis and Swelling     Review of Systems:  Review of Systems  Constitutional: Negative.   HENT: Negative for congestion, ear discharge, ear pain, hearing loss, nosebleeds, sore throat and tinnitus.   Eyes: Positive for blurred vision. Negative for double vision, photophobia, pain, discharge and redness.  Respiratory: Positive for cough and shortness of breath. Negative for hemoptysis, sputum production, wheezing and stridor.   Cardiovascular: Positive for palpitations. Negative for chest pain, orthopnea, claudication, leg swelling and PND.  Gastrointestinal: Positive for heartburn, abdominal pain (has sharp pain at place of hernia repair) and constipation (q 3-4 days, last BM this AM, + straining). Negative for nausea, vomiting, diarrhea, blood in stool and melena.  Genitourinary: Negative.   Musculoskeletal: Negative.   Skin: Negative.   Neurological: Positive for dizziness and headaches. Negative for tingling, tremors, sensory change, speech change, focal weakness, seizures and loss of consciousness.  Psychiatric/Behavioral: Positive for memory loss. Negative for depression, suicidal ideas, hallucinations and substance abuse. The patient is not nervous/anxious and does not have insomnia.     Family history- Review and unchanged Social history-  Review and unchanged Physical Exam: BP 128/70 mmHg  Pulse 76  Temp(Src) 97.9 F (36.6 C)  Resp 16  Ht 5\' 8"  (1.727 m)  Wt 183 lb (83.008 kg)  BMI 27.83 kg/m2 Wt Readings from Last 3 Encounters:  07/17/14 183 lb (83.008 kg)  04/11/14 182 lb (82.555 kg)  01/09/14 179 lb (81.194 kg)   General Appearance: Well nourished, in no apparent distress. Eyes: PERRLA, EOMs, conjunctiva no swelling or erythema Sinuses: No Frontal/maxillary tenderness ENT/Mouth: Ext aud canals clear, TMs without erythema, bulging. No erythema, swelling, or exudate on post pharynx.  Tonsils not swollen or erythematous. Hearing normal.  Neck: Supple, thyroid normal.  Respiratory: Respiratory effort normal, BS equal bilaterally without rales, rhonchi, wheezing or stridor.  Cardio: RRR with systolic murmur RSB. Brisk peripheral pulses 1+ edema.  Abdomen: Soft, + BS. + right inguinal/right suprapubic tenderness, no guarding, rebound, hernias, masses. Lymphatics: Non tender without lymphadenopathy.  Musculoskeletal: Full ROM, 5/5 strength, normal gait.  Skin: Warm, dry without rashes, lesions, ecchymosis.  Well healing right inguinal scar.   Neuro: Cranial nerves intact. Normal muscle tone, no cerebellar symptoms. Sensation intact.  Psych: Awake and oriented X 3, normal affect, Insight and Judgment appropriate.    Vicie Mutters, PA-C 3:37 PM [facility]

## 2014-07-17 NOTE — Patient Instructions (Addendum)
Cut the alfuzosin in half.  Monitor your sugar.  if worsening HA, changes vision/speech, imbalance, weakness go to the ER Follow up with your eye doctor.  Follow up with heart doctor for holter monitor and echo.- Dr. Marigene Ehlers [phone];    Call your surgeon about your pain at your site and treat the constipation.   Take omeprazole over the counter for 2 weeks, then go to zantac 150-300 mg at night for 2 weeks, then you can stop.  Avoid alcohol, spicy foods, NSAIDS (aleve, ibuprofen) at this time. See foods below.   Food Choices for Gastroesophageal Reflux Disease When you have gastroesophageal reflux disease (GERD), the foods you eat and your eating habits are very important. Choosing the right foods can help ease the discomfort of GERD. WHAT GENERAL GUIDELINES DO I NEED TO FOLLOW?  Choose fruits, vegetables, whole grains, low-fat dairy products, and low-fat meat, fish, and poultry.  Limit fats such as oils, salad dressings, butter, nuts, and avocado.  Keep a food diary to identify foods that cause symptoms.  Avoid foods that cause reflux. These may be different for different people.  Eat frequent small meals instead of three large meals each day.  Eat your meals slowly, in a relaxed setting.  Limit fried foods.  Cook foods using methods other than frying.  Avoid drinking alcohol.  Avoid drinking large amounts of liquids with your meals.  Avoid bending over or lying down until 2-3 hours after eating. WHAT FOODS ARE NOT RECOMMENDED? The following are some foods and drinks that may worsen your symptoms: Vegetables Tomatoes. Tomato juice. Tomato and spaghetti sauce. Chili peppers. Onion and garlic. Horseradish. Fruits Oranges, grapefruit, and lemon (fruit and juice). Meats High-fat meats, fish, and poultry. This includes hot dogs, ribs, ham, sausage, salami, and bacon. Dairy Whole milk and chocolate milk. Sour cream. Cream. Butter. Ice cream. Cream cheese.   Beverages Coffee and tea, with or without caffeine. Carbonated beverages or energy drinks. Condiments Hot sauce. Barbecue sauce.  Sweets/Desserts Chocolate and cocoa. Donuts. Peppermint and spearmint. Fats and Oils High-fat foods, including [place] fries and potato chips. Other Vinegar. Strong spices, such as black pepper, white pepper, red pepper, cayenne, curry powder, cloves, ginger, and chili powder.   Constipation Constipation is when a person has fewer than three bowel movements a week, has difficulty having a bowel movement, or has stools that are dry, hard, or larger than normal. As people grow older, constipation is more common. If you try to fix constipation with medicines that make you have a bowel movement (laxatives), the problem may get worse. Long-term laxative use may cause the muscles of the colon to become weak. A low-fiber diet, not taking in enough fluids, and taking certain medicines may make constipation worse.  CAUSES   Certain medicines, such as antidepressants, pain medicine, iron supplements, antacids, and water pills.   Certain diseases, such as diabetes, irritable bowel syndrome (IBS), thyroid disease, or depression.   Not drinking enough water.   Not eating enough fiber-rich foods.   Stress or travel.   Lack of physical activity or exercise.   Ignoring the urge to have a bowel movement.   Using laxatives too much.  SIGNS AND SYMPTOMS   Having fewer than three bowel movements a week.   Straining to have a bowel movement.   Having stools that are hard, dry, or larger than normal.   Feeling full or bloated.   Pain in the lower abdomen.   Not feeling relief after having a  bowel movement.  DIAGNOSIS  Your health care provider will take a medical history and perform a physical exam. Further testing may be done for severe constipation. Some tests may include:  A barium enema X-ray to examine your rectum, colon, and, sometimes, your  small intestine.   A sigmoidoscopy to examine your lower colon.   A colonoscopy to examine your entire colon. TREATMENT  Treatment will depend on the severity of your constipation and what is causing it. Some dietary treatments include drinking more fluids and eating more fiber-rich foods. Lifestyle treatments may include regular exercise. If these diet and lifestyle recommendations do not help, your health care provider may recommend taking over-the-counter laxative medicines to help you have bowel movements. Prescription medicines may be prescribed if over-the-counter medicines do not work.  HOME CARE INSTRUCTIONS   Eat foods that have a lot of fiber, such as fruits, vegetables, whole grains, and beans.  Limit foods high in fat and processed sugars, such as french fries, hamburgers, cookies, candies, and soda.   A fiber supplement may be added to your diet if you cannot get enough fiber from foods.   Drink enough fluids to keep your urine clear or pale yellow.   Exercise regularly or as directed by your health care provider.   Go to the restroom when you have the urge to go. Do not hold it.   Only take over-the-counter or prescription medicines as directed by your health care provider. Do not take other medicines for constipation without talking to your health care provider first.  [place] IF:   You have bright red blood in your stool.   Your constipation lasts for more than 4 days or gets worse.   You have abdominal or rectal pain.   You have thin, pencil-like stools.   You have unexplained weight loss. MAKE SURE YOU:   Understand these instructions.  Will watch your condition.  Will get help right away if you are not doing well or get worse. Document Released: 03/14/2004 Document Revised: 06/21/2013 Document Reviewed: 03/28/2013 [facility] Patient Information 2015 [place], [place]. This information is not intended to replace advice given to  you by your health care provider. Make sure you discuss any questions you have with your health care provider.

## 2014-07-18 LAB — BASIC METABOLIC PANEL WITH GFR
BUN: 19 mg/dL (ref 6–23)
CO2: 22 mEq/L (ref 19–32)
Calcium: 9.7 mg/dL (ref 8.4–10.5)
Chloride: 106 mEq/L (ref 96–112)
Creat: 1.04 mg/dL (ref 0.50–1.35)
GFR, EST AFRICAN AMERICAN: 81 mL/min
GFR, Est Non African American: 70 mL/min
Glucose, Bld: 161 mg/dL — ABNORMAL HIGH (ref 70–99)
Potassium: 4.6 mEq/L (ref 3.5–5.3)
Sodium: 141 mEq/L (ref 135–145)

## 2014-07-18 LAB — SEDIMENTATION RATE: Sed Rate: 4 mm/hr (ref 0–16)

## 2014-07-18 LAB — HEPATIC FUNCTION PANEL
ALBUMIN: 4.3 g/dL (ref 3.5–5.2)
ALT: 15 U/L (ref 0–53)
AST: 12 U/L (ref 0–37)
Alkaline Phosphatase: 54 U/L (ref 39–117)
BILIRUBIN DIRECT: 0.2 mg/dL (ref 0.0–0.3)
BILIRUBIN INDIRECT: 0.6 mg/dL (ref 0.2–1.2)
TOTAL PROTEIN: 6.6 g/dL (ref 6.0–8.3)
Total Bilirubin: 0.8 mg/dL (ref 0.2–1.2)

## 2014-07-18 LAB — LIPID PANEL
Cholesterol: 112 mg/dL (ref 0–200)
HDL: 39 mg/dL — ABNORMAL LOW (ref >39)
LDL Cholesterol: 46 mg/dL (ref 0–99)
TRIGLYCERIDES: 133 mg/dL (ref <150)
Total CHOL/HDL Ratio: 2.9 Ratio
VLDL: 27 mg/dL (ref 0–40)

## 2014-07-18 LAB — HEMOGLOBIN A1C
Hgb A1c MFr Bld: 5.6 % (ref <5.7)
Mean Plasma Glucose: 114 mg/dL (ref <117)

## 2014-07-18 LAB — MAGNESIUM: MAGNESIUM: 1.9 mg/dL (ref 1.5–2.5)

## 2014-07-18 LAB — INSULIN, FASTING: Insulin fasting, serum: 31.9 u[IU]/mL — ABNORMAL HIGH (ref 2.0–19.6)

## 2014-07-18 LAB — TSH: TSH: 1.477 u[IU]/mL (ref 0.350–4.500)

## 2014-07-30 ENCOUNTER — Other Ambulatory Visit: Payer: Self-pay | Admitting: Physician Assistant

## 2014-08-24 ENCOUNTER — Encounter: Payer: Self-pay | Admitting: Physician Assistant

## 2014-08-24 ENCOUNTER — Ambulatory Visit (INDEPENDENT_AMBULATORY_CARE_PROVIDER_SITE_OTHER): Payer: Medicare Other | Admitting: Physician Assistant

## 2014-08-24 VITALS — BP 138/64 | HR 84 | Temp 98.6°F | Resp 16 | Ht 66.0 in | Wt 188.0 lb

## 2014-08-24 DIAGNOSIS — R7303 Prediabetes: Secondary | ICD-10-CM

## 2014-08-24 DIAGNOSIS — R7309 Other abnormal glucose: Secondary | ICD-10-CM

## 2014-08-24 DIAGNOSIS — E785 Hyperlipidemia, unspecified: Secondary | ICD-10-CM

## 2014-08-24 DIAGNOSIS — Z79899 Other long term (current) drug therapy: Secondary | ICD-10-CM

## 2014-08-24 DIAGNOSIS — E079 Disorder of thyroid, unspecified: Secondary | ICD-10-CM

## 2014-08-24 DIAGNOSIS — I10 Essential (primary) hypertension: Secondary | ICD-10-CM

## 2014-08-24 DIAGNOSIS — E559 Vitamin D deficiency, unspecified: Secondary | ICD-10-CM

## 2014-08-24 MED ORDER — AZITHROMYCIN 250 MG PO TABS: ORAL_TABLET | ORAL | Status: DC

## 2014-08-24 NOTE — Progress Notes (Signed)
Assessment and Plan: Cough- not better will try samples of benicar and if it is better will send in diovan and if not will go back to ACE. Will also do ABX with increasing neutrophils- will send in zpak.  Dizziness/vision changes- continue 1/2 of the alfuzosin- will still get Carotid US Palpitations- better, declines holter for now.   Future Appointments Date Time Provider [place]  11/07/2014 2:00 PM [name], MD [facility] None    HPI 74 y.o.male presents for 1 month follow up from 3 month OV. He was having a cough at that time and continues to have cough despite GERD treatment. He is on an ACE and states he still has nasal drainage. He was having dizziness but since he has decreased his alfuzosin to 5mg  he has not had any more dizziness or palpitations since doing this. Has not had eye doctor appointment but will set it up   Past Medical History  Diagnosis Date  . Hypertension   . Hyperlipidemia   . GERD (gastroesophageal reflux disease)   . Prediabetes   . Asthma   . IBS (irritable bowel syndrome)   . Thyroid disease   . RLS (restless legs syndrome)   . Atherosclerosis of aorta     via CXR  . OSA (obstructive sleep apnea)     has a cpap-canot use it  . COPD (chronic obstructive pulmonary disease)   . Wears glasses   . Wears partial dentures     top  . Seasonal allergies   . Arthritis      Allergies  Allergen Reactions  . Penicillins Anaphylaxis and Swelling  . Sulfa Antibiotics Anaphylaxis and Swelling      Current Outpatient Prescriptions on File Prior to Visit  Medication Sig Dispense Refill  . alfuzosin (UROXATRAL) 10 MG 24 hr tablet Take 10 mg by mouth every evening.    [name] aspirin 81 MG chewable tablet Chew 81 mg by mouth daily.    . Cholecalciferol (VITAMIN D PO) Take 7,000 Int'l Units by mouth daily.    . fluticasone (FLONASE) 50 MCG/ACT nasal spray USE TWO SPRAYS IN EACH NOSTRIL EVERY DAY 16 g 3  . levothyroxine (SYNTHROID, LEVOTHROID) 50 MCG  tablet TAKE ONE TABLET BY MOUTH ONCE DAILY 90 tablet 3  . lisinopril (PRINIVIL,ZESTRIL) 20 MG tablet TAKE ONE TABLET BY MOUTH ONCE DAILY 90 tablet 0  . predniSONE (DELTASONE) 10 MG tablet take one tablet every other day    . simvastatin (ZOCOR) 20 MG tablet TAKE ONE TABLET BY MOUTH EVERY DAY 90 tablet 0   No current facility-administered medications on file prior to visit.    ROS: all negative except above.   Physical Exam: Filed Weights   08/24/14 1334  Weight: 188 lb (85.276 kg)   BP 138/64 mmHg  Pulse 84  Temp(Src) 98.6 F (37 C) (Temporal)  Resp 16  Ht 5\' 6"  (1.676 m)  Wt 188 lb (85.276 kg)  BMI 30.36 kg/m2 General Appearance: Well nourished, in no apparent distress. Eyes: PERRLA, EOMs, conjunctiva no swelling or erythema Sinuses: No Frontal/maxillary tenderness ENT/Mouth: Ext aud canals clear, TMs without erythema, bulging. No erythema, swelling, or exudate on post pharynx. Tonsils not swollen or erythematous. Hearing normal.  Neck: Supple, thyroid normal.  Respiratory: Respiratory effort normal, BS equal bilaterally without rales, rhonchi, wheezing or stridor.  Cardio: RRR with systolic murmur RSB. Brisk peripheral pulses 1+ edema.  Abdomen: Soft, + BS. + right inguinal/right suprapubic tenderness, no guarding, rebound, hernias, masses. Lymphatics: Non tender  without lymphadenopathy.  Musculoskeletal: Full ROM, 5/5 strength, normal gait.  Skin: Warm, dry without rashes, lesions, ecchymosis. Well healing right inguinal scar.  Neuro: Cranial nerves intact. Normal muscle tone, no cerebellar symptoms. Sensation intact.  Psych: Awake and oriented X 3, normal affect, Insight and Judgment appropriate.     Vicie Mutters, PA-C 1:45 PM [facility]

## 2014-08-24 NOTE — Patient Instructions (Addendum)
The ACE/enalapril you are on can cause a cough, please stop and try the benicar samples for 2 weeks to see if this helps the cough.   Four common causes of cough:   Allergies, Viral Infections, Acid Reflux and Bacterial Infections. 1) Allergies and viral infections cause a cough by post nasal drip and are often worse at night, can also have sneezing, lower grade fevers, clear/yellow mucus. This is best treated with allergy medications or nasal sprays.  2) Bacterial infections are more severe than allergies or viral infections with fever, teeth pain, fatigue. This can be treated with prednisone and the same over the counter medication and after 7 days an antibiotic.  3) Silent reflux/GERD can cause a cough WITHOUT heart burn because the esophagus that goes to the stomach and trachea that goes to the lungs are very close and when you lay down the acid can irritate your throat and lungs. This can cause hoarseness, cough, and wheezing. Please stop any alcohol or anti-inflammatories like aleve/advil/ibuprofen and start an over the counter Prilosec or omeprazole 1-2 times daily 58mins before food for 2 weeks, then switch to over the counter zantac/ratinidine or pepcid/famotadine once at night for 2 weeks.   4) sometimes irritation causes more irritation. Try voice rest, use sugar free cough drops to prevent coughing, and try to stop clearing your throat.   If you ever have a cough that does not go away after trying these things please make a follow up visit for further evaluation or we can refer you to a specialist. Or if you ever have shortness of breath or chest pain go to the ER.

## 2014-09-25 ENCOUNTER — Encounter: Payer: Self-pay | Admitting: Physician Assistant

## 2014-10-03 ENCOUNTER — Ambulatory Visit (INDEPENDENT_AMBULATORY_CARE_PROVIDER_SITE_OTHER): Payer: Medicare Other | Admitting: Internal Medicine

## 2014-10-03 ENCOUNTER — Encounter: Payer: Self-pay | Admitting: Internal Medicine

## 2014-10-03 VITALS — BP 152/60 | HR 68 | Temp 98.2°F | Resp 16 | Ht 66.0 in | Wt 188.0 lb

## 2014-10-03 DIAGNOSIS — H9312 Tinnitus, left ear: Secondary | ICD-10-CM

## 2014-10-03 DIAGNOSIS — I10 Essential (primary) hypertension: Secondary | ICD-10-CM

## 2014-10-03 DIAGNOSIS — R55 Syncope and collapse: Secondary | ICD-10-CM

## 2014-10-03 DIAGNOSIS — I359 Nonrheumatic aortic valve disorder, unspecified: Secondary | ICD-10-CM

## 2014-10-03 LAB — CBC WITH DIFFERENTIAL/PLATELET
BASOS ABS: 0.1 10*3/uL (ref 0.0–0.1)
Basophils Relative: 1 % (ref 0–1)
Eosinophils Absolute: 0.2 10*3/uL (ref 0.0–0.7)
Eosinophils Relative: 3 % (ref 0–5)
HCT: 43.9 % (ref 39.0–52.0)
HEMOGLOBIN: 15.4 g/dL (ref 13.0–17.0)
Lymphocytes Relative: 27 % (ref 12–46)
Lymphs Abs: 1.4 10*3/uL (ref 0.7–4.0)
MCH: 32.3 pg (ref 26.0–34.0)
MCHC: 35.1 g/dL (ref 30.0–36.0)
MCV: 92 fL (ref 78.0–100.0)
MPV: 10.8 fL (ref 8.6–12.4)
Monocytes Absolute: 0.4 10*3/uL (ref 0.1–1.0)
Monocytes Relative: 8 % (ref 3–12)
NEUTROS ABS: 3.2 10*3/uL (ref 1.7–7.7)
NEUTROS PCT: 61 % (ref 43–77)
PLATELETS: 126 10*3/uL — AB (ref 150–400)
RBC: 4.77 MIL/uL (ref 4.22–5.81)
RDW: 14.6 % (ref 11.5–15.5)
WBC: 5.3 10*3/uL (ref 4.0–10.5)

## 2014-10-03 NOTE — Patient Instructions (Signed)
Tinnitus Sounds you hear in your ears and coming from within the ear is called tinnitus. This can be a symptom of many ear disorders. It is often associated with hearing loss.  Tinnitus can be seen with:  Infections.  Ear blockages such as wax buildup.  Meniere's disease.  Ear damage.  Inherited.  Occupational causes. While irritating, it is not usually a threat to health. When the cause of the tinnitus is wax, infection in the middle ear, or foreign body it is easily treated. Hearing loss will usually be reversible.  TREATMENT  When treating the underlying cause does not get rid of tinnitus, it may be necessary to get rid of the unwanted sound by covering it up with more pleasant background noises. This may include music, the radio etc. There are tinnitus maskers which can be worn which produce background noise to cover up the tinnitus. Avoid all medications which tend to make tinnitus worse such as alcohol, caffeine, aspirin, and nicotine. There are many soothing background tapes such as rain, ocean, thunderstorms, etc. These soothing sounds help with sleeping or resting. Keep all follow-up appointments and referrals. This is important to identify the cause of the problem. It also helps avoid complications, impaired hearing, disability, or chronic pain. Document Released: 06/16/2005 Document Revised: 09/08/2011 Document Reviewed: 02/02/2008 [facility] Patient Information 2015 [place], [place]. This information is not intended to replace advice given to you by your health care provider. Make sure you discuss any questions you have with your health care provider.  Near-Syncope Near-syncope (commonly known as near fainting) is sudden weakness, dizziness, or feeling like you might pass out. During an episode of near-syncope, you may also develop pale skin, have tunnel vision, or feel sick to your stomach (nauseous). Near-syncope may occur when getting up after sitting or while standing for a long  time. It is caused by a sudden decrease in blood flow to the brain. This decrease can result from various causes or triggers, most of which are not serious. However, because near-syncope can sometimes be a sign of something serious, a medical evaluation is required. The specific cause is often not determined. HOME CARE INSTRUCTIONS  Monitor your condition for any changes. The following actions may help to alleviate any discomfort you are experiencing:  Have someone stay with you until you feel stable.  Lie down right away and prop your feet up if you start feeling like you might faint. Breathe deeply and steadily. Wait until all the symptoms have passed. Most of these episodes last only a few minutes. You may feel tired for several hours.   Drink enough fluids to keep your urine clear or pale yellow.   If you are taking blood pressure or heart medicine, get up slowly when seated or lying down. Take several minutes to sit and then stand. This can reduce dizziness.  Follow up with your health care provider as directed. SEEK IMMEDIATE MEDICAL CARE IF:   You have a severe headache.   You have unusual pain in the chest, abdomen, or back.   You are bleeding from the mouth or rectum, or you have black or tarry stool.   You have an irregular or very fast heartbeat.   You have repeated fainting or have seizure-like jerking during an episode.   You faint when sitting or lying down.   You have confusion.   You have difficulty walking.   You have severe weakness.   You have vision problems.  MAKE SURE YOU:   Understand  these instructions.  Will watch your condition.  Will get help right away if you are not doing well or get worse. Document Released: 06/16/2005 Document Revised: 06/21/2013 Document Reviewed: 11/19/2012 [facility] Patient Information 2015 [place], [place]. This information is not intended to replace advice given to you by your health care provider. Make sure you  discuss any questions you have with your health care provider.

## 2014-10-03 NOTE — Progress Notes (Signed)
Subjective:    Patient ID: Robert Schaefer, male    DOB: [birth year], 74 y.o.   MRN: [id number]  Near Syncope Associated symptoms include chest pain, congestion, headaches and nausea. Pertinent negatives include no abdominal pain, chills, coughing, fatigue, fever, sore throat, vomiting or weakness.  Patient presents to the office for evaluation of light headedness and dizziness.  Patient states that he has had some episodes where he has a few seconds of dizziness and vertigo like symptoms.  He reports that this has been going on for 2-3 months.  He reports that he has an aortic valve disorder and is concerned that this is secondary to his heart.  Patient was supposed to have an appointment set up with cardiology for carotid dopplers and full workup.  He has seen Dr. Johnsie Cancel in the past and has not seen Dr. Johnsie Cancel since 2011.  He reports that he was told by another PA in the group that he needed carotid dopplers.  He does report that he does have some shortness of breath with exertion like walking up the hill from his mailbox.  He does report some chest pain occasionally.  He thinks it is muscular.  Patient does report being able to hear his heart beat in his left ear.  He is still taking his baby aspirin every day.    Review of Systems  Constitutional: Negative for fever, chills and fatigue.  HENT: Positive for congestion, postnasal drip, rhinorrhea and sneezing. Negative for sore throat.   Eyes: Negative.   Respiratory: Positive for shortness of breath. Negative for cough, chest tightness and wheezing.   Cardiovascular: Positive for chest pain, palpitations and near-syncope. Negative for leg swelling.       Claudication  Gastrointestinal: Positive for nausea. Negative for vomiting, abdominal pain, diarrhea and constipation.  Neurological: Positive for dizziness, light-headedness and headaches. Negative for syncope (near syncope) and weakness.       Objective:   Physical Exam  Constitutional: He  is oriented to person, place, and time. He appears well-developed and well-nourished. No distress.  HENT:  Head: Normocephalic and atraumatic.  Mouth/Throat: Oropharynx is clear and moist. No oropharyngeal exudate.  Eyes: Conjunctivae and EOM are normal. Pupils are equal, round, and reactive to light. No scleral icterus.  Neck: Normal range of motion. Neck supple. No JVD present. No thyromegaly present.  Left sided carotid bruit that sounds soft and blowing.   Cardiovascular: Normal rate, regular rhythm and intact distal pulses.  Exam reveals no gallop and no friction rub.   Murmur heard. 3/6 murmur heard best in the 2nd right ICS.  Does radiate up to the left carotid  Pulmonary/Chest: Effort normal and breath sounds normal. No respiratory distress. He has no wheezes. He has no rales. He exhibits no tenderness.  Abdominal: Soft. Bowel sounds are normal. He exhibits no distension and no mass. There is no tenderness. There is no rebound and no guarding.  Musculoskeletal: Normal range of motion.  Lymphadenopathy:    He has no cervical adenopathy.  Neurological: He is alert and oriented to person, place, and time.  Skin: Skin is warm and dry. He is not diaphoretic.  Psychiatric: He has a normal mood and affect. His behavior is normal. Judgment and thought content normal.  Nursing note and vitals reviewed.    Filed Vitals:   10/03/14 1557  BP: 152/60  Pulse: 68  Temp: 98.2 F (36.8 C)  Resp: 16        Assessment & Plan:  1. Aortic valve disorder  - US Carotid Duplex Bilateral; Future - Ambulatory referral to Cardiology  2. Essential hypertension -EKG normal - EKG 12-Lead  3. Near syncope -possible vertigo vs. Near syncope.  Could be related to aortic valve dysfunction but also could be related to mild middle ear effusions.  Will try meclizine and also short course of prednisone.   - US Carotid Duplex Bilateral; Future - Ambulatory referral to Cardiology - CBC with  Differential/Platelet - CMP with Estimated GFR ([id number])  4. Tinnitus of left ear  - US Carotid Duplex Bilateral; Future

## 2014-10-04 ENCOUNTER — Encounter: Payer: Self-pay | Admitting: Internal Medicine

## 2014-10-04 LAB — COMPLETE METABOLIC PANEL WITH GFR
ALK PHOS: 46 U/L (ref 39–117)
ALT: 15 U/L (ref 0–53)
AST: 14 U/L (ref 0–37)
Albumin: 4.4 g/dL (ref 3.5–5.2)
BILIRUBIN TOTAL: 1.1 mg/dL (ref 0.2–1.2)
BUN: 19 mg/dL (ref 6–23)
CHLORIDE: 104 meq/L (ref 96–112)
CO2: 26 mEq/L (ref 19–32)
Calcium: 9.5 mg/dL (ref 8.4–10.5)
Creat: 1.11 mg/dL (ref 0.50–1.35)
GFR, Est African American: 75 mL/min
GFR, Est Non African American: 65 mL/min
GLUCOSE: 114 mg/dL — AB (ref 70–99)
Potassium: 4.4 mEq/L (ref 3.5–5.3)
Sodium: 141 mEq/L (ref 135–145)
TOTAL PROTEIN: 6.9 g/dL (ref 6.0–8.3)

## 2014-10-12 ENCOUNTER — Other Ambulatory Visit: Payer: Medicare Other

## 2014-10-18 ENCOUNTER — Ambulatory Visit
Admission: RE | Admit: 2014-10-18 | Discharge: 2014-10-18 | Disposition: A | Discharge status: Home / Self Care | Payer: Medicare Other | Source: Ambulatory Visit | Attending: Internal Medicine | Admitting: Internal Medicine

## 2014-10-18 DIAGNOSIS — I359 Nonrheumatic aortic valve disorder, unspecified: Secondary | ICD-10-CM

## 2014-10-18 DIAGNOSIS — H9312 Tinnitus, left ear: Secondary | ICD-10-CM

## 2014-10-18 DIAGNOSIS — I6523 Occlusion and stenosis of bilateral carotid arteries: Secondary | ICD-10-CM | POA: Diagnosis not present

## 2014-10-18 DIAGNOSIS — R55 Syncope and collapse: Secondary | ICD-10-CM

## 2014-11-06 NOTE — Progress Notes (Signed)
erron

## 2014-11-07 ENCOUNTER — Encounter: Payer: Self-pay | Admitting: Internal Medicine

## 2014-11-07 ENCOUNTER — Ambulatory Visit (INDEPENDENT_AMBULATORY_CARE_PROVIDER_SITE_OTHER): Payer: Medicare Other | Admitting: Internal Medicine

## 2014-11-07 VITALS — BP 138/60 | HR 80 | Temp 97.7°F | Resp 16 | Ht 66.0 in | Wt 185.0 lb

## 2014-11-07 DIAGNOSIS — J449 Chronic obstructive pulmonary disease, unspecified: Secondary | ICD-10-CM

## 2014-11-07 DIAGNOSIS — I10 Essential (primary) hypertension: Secondary | ICD-10-CM | POA: Diagnosis not present

## 2014-11-07 DIAGNOSIS — N401 Enlarged prostate with lower urinary tract symptoms: Secondary | ICD-10-CM

## 2014-11-07 DIAGNOSIS — E039 Hypothyroidism, unspecified: Secondary | ICD-10-CM

## 2014-11-07 DIAGNOSIS — E785 Hyperlipidemia, unspecified: Secondary | ICD-10-CM | POA: Diagnosis not present

## 2014-11-07 DIAGNOSIS — R7309 Other abnormal glucose: Secondary | ICD-10-CM | POA: Diagnosis not present

## 2014-11-07 DIAGNOSIS — R972 Elevated prostate specific antigen [PSA]: Secondary | ICD-10-CM

## 2014-11-07 DIAGNOSIS — R7303 Prediabetes: Secondary | ICD-10-CM

## 2014-11-07 DIAGNOSIS — Z0001 Encounter for general adult medical examination with abnormal findings: Secondary | ICD-10-CM | POA: Diagnosis not present

## 2014-11-07 DIAGNOSIS — G4733 Obstructive sleep apnea (adult) (pediatric): Secondary | ICD-10-CM

## 2014-11-07 DIAGNOSIS — E559 Vitamin D deficiency, unspecified: Secondary | ICD-10-CM

## 2014-11-07 DIAGNOSIS — Z0000 Encounter for general adult medical examination without abnormal findings: Secondary | ICD-10-CM

## 2014-11-07 DIAGNOSIS — K219 Gastro-esophageal reflux disease without esophagitis: Secondary | ICD-10-CM

## 2014-11-07 DIAGNOSIS — Z79899 Other long term (current) drug therapy: Secondary | ICD-10-CM | POA: Diagnosis not present

## 2014-11-07 DIAGNOSIS — Z1212 Encounter for screening for malignant neoplasm of rectum: Secondary | ICD-10-CM

## 2014-11-07 DIAGNOSIS — R6889 Other general symptoms and signs: Secondary | ICD-10-CM | POA: Diagnosis not present

## 2014-11-07 DIAGNOSIS — Z125 Encounter for screening for malignant neoplasm of prostate: Secondary | ICD-10-CM

## 2014-11-07 DIAGNOSIS — Z1331 Encounter for screening for depression: Secondary | ICD-10-CM

## 2014-11-07 DIAGNOSIS — Z9181 History of falling: Secondary | ICD-10-CM

## 2014-11-07 DIAGNOSIS — N138 Other obstructive and reflux uropathy: Secondary | ICD-10-CM | POA: Insufficient documentation

## 2014-11-07 LAB — CBC WITH DIFFERENTIAL/PLATELET
BASOS ABS: 0.1 10*3/uL (ref 0.0–0.1)
Basophils Relative: 1 % (ref 0–1)
EOS ABS: 0.1 10*3/uL (ref 0.0–0.7)
EOS PCT: 1 % (ref 0–5)
HEMATOCRIT: 40.8 % (ref 39.0–52.0)
HEMOGLOBIN: 14.1 g/dL (ref 13.0–17.0)
LYMPHS PCT: 13 % (ref 12–46)
Lymphs Abs: 0.8 10*3/uL (ref 0.7–4.0)
MCH: 31.9 pg (ref 26.0–34.0)
MCHC: 34.6 g/dL (ref 30.0–36.0)
MCV: 92.3 fL (ref 78.0–100.0)
MONOS PCT: 3 % (ref 3–12)
MPV: 11.1 fL (ref 8.6–12.4)
Monocytes Absolute: 0.2 10*3/uL (ref 0.1–1.0)
NEUTROS PCT: 82 % — AB (ref 43–77)
Neutro Abs: 4.8 10*3/uL (ref 1.7–7.7)
Platelets: 120 10*3/uL — ABNORMAL LOW (ref 150–400)
RBC: 4.42 MIL/uL (ref 4.22–5.81)
RDW: 14.6 % (ref 11.5–15.5)
WBC: 5.8 10*3/uL (ref 4.0–10.5)

## 2014-11-07 LAB — HEMOGLOBIN A1C
Hgb A1c MFr Bld: 5.9 % — ABNORMAL HIGH (ref <5.7)
Mean Plasma Glucose: 123 mg/dL — ABNORMAL HIGH (ref <117)

## 2014-11-07 NOTE — Patient Instructions (Signed)
++++++++++++++++++++++++++++++++++  Recommend Low dose or baby Aspirin 81 mg daily   To reduce risk of Colon Cancer 20 %, Skin Cancer 26 % , Melanoma 46% and   Pancreatic cancer 60%  +++++++++++++++++++++++++++++++++ Vitamin D goal is between 70-100.   Please make sure that you are taking your Vitamin D as directed.   It is very important as a natural antiinflammatory   helping hair, skin, and nails, as well as reducing stroke and heart attack risk.   It helps your bones and helps with mood.  It also decreases numerous cancer risks so please take it as directed.   Low Vit D is associated with a 200-300% higher risk for CANCER   and 200-300% higher risk for HEART   ATTACK  &  STROKE.    ...................................................................................  It is also associated with higher death rate at younger ages,   autoimmune diseases like Rheumatoid arthritis, Lupus, Multiple Sclerosis.     Also many other serious conditions, like depression, Alzheimer's  Dementia, infertility, muscle aches, fatigue, fibromyalgia - just to name a few.  ++++++++++++++++++++++++++++++++++++++++ Recommend the book "The END of DIETING" by Dr Joel Fuhrman   & the book "The END of DIABETES " by Dr Joel Fuhrman  At [employer] - get book & Audio CD's     Being diabetic has a  300% increased risk for heart attack, stroke, cancer, and alzheimer- type vascular dementia. It is very important that you work harder with diet by avoiding all foods that are white. Avoid white rice (brown & wild rice is OK), white potatoes (sweetpotatoes in moderation is OK), White bread or wheat bread or anything made out of white flour like bagels, donuts, rolls, buns, biscuits, cakes, pastries, cookies, pizza crust, and pasta (made from white flour & egg whites) - vegetarian pasta or spinach or wheat pasta is OK. Multigrain breads like Arnold's or [employer], or multigrain sandwich thins or  flatbreads.  Diet, exercise and weight loss can reverse and cure diabetes in the early stages.  Diet, exercise and weight loss is very important in the control and prevention of complications of diabetes which affects every system in your body, ie. Brain - dementia/stroke, eyes - glaucoma/blindness, heart - heart attack/heart failure, kidneys - dialysis, stomach - gastric paralysis, intestines - malabsorption, nerves - severe painful neuritis, circulation - gangrene & loss of a leg(s), and finally cancer and Alzheimers.    I recommend avoid fried & greasy foods,  sweets/candy, white rice (brown or wild rice or Quinoa is OK), white potatoes (sweet potatoes are OK) - anything made from white flour - bagels, doughnuts, rolls, buns, biscuits,white and wheat breads, pizza crust and traditional pasta made of white flour & egg white(vegetarian pasta or spinach or wheat pasta is OK).  Multi-grain bread is OK - like multi-grain flat bread or sandwich thins. Avoid alcohol in excess. Exercise is also important.    Eat all the vegetables you want - avoid meat, especially red meat and dairy - especially cheese.  Cheese is the most concentrated form of trans-fats which is the worst thing to clog up our arteries. Veggie cheese is OK which can be found in the fresh produce section at [employer] or [employer] or [employer]  ++++++++++++++++++++++++++++++++++++++++++++++++++++++++ Preventive Care for Adults A healthy lifestyle and preventive care can promote health and wellness. Preventive health guidelines for men include the following key practices:  A routine yearly physical is a good way to check with your health care provider about your   health and preventative screening. It is a chance to share any concerns and updates on your health and to receive a thorough exam.  Visit your dentist for a routine exam and preventative care every 6 months. Brush your teeth twice a day and floss once a day. Good oral hygiene  prevents tooth decay and gum disease.  The frequency of eye exams is based on your age, health, family medical history, use of contact lenses, and other factors. Follow your health care provider's recommendations for frequency of eye exams.  Eat a healthy diet. Foods such as vegetables, fruits, whole grains, low-fat dairy products, and lean protein foods contain the nutrients you need without too many calories. Decrease your intake of foods high in solid fats, added sugars, and salt. Eat the right amount of calories for you.Get information about a proper diet from your health care provider, if necessary.  Regular physical exercise is one of the most important things you can do for your health. Most adults should get at least 150 minutes of moderate-intensity exercise (any activity that increases your heart rate and causes you to sweat) each week. In addition, most adults need muscle-strengthening exercises on 2 or more days a week.  Maintain a healthy weight. The body mass index (BMI) is a screening tool to identify possible weight problems. It provides an estimate of body fat based on height and weight. Your health care provider can find your BMI and can help you achieve or maintain a healthy weight.For adults 20 years and older:  A BMI below 18.5 is considered underweight.  A BMI of 18.5 to 24.9 is normal.  A BMI of 25 to 29.9 is considered overweight.  A BMI of 30 and above is considered obese.  Maintain normal blood lipids and cholesterol levels by exercising and minimizing your intake of saturated fat. Eat a balanced diet with plenty of fruit and vegetables. Blood tests for lipids and cholesterol should begin at age 20 and be repeated every 5 years. If your lipid or cholesterol levels are high, you are over 50, or you are at high risk for heart disease, you may need your cholesterol levels checked more frequently.Ongoing high lipid and cholesterol levels should be treated with medicines if  diet and exercise are not working.  If you smoke, find out from your health care provider how to quit. If you do not use tobacco, do not start.  Lung cancer screening is recommended for adults aged 55-80 years who are at high risk for developing lung cancer because of a history of smoking. A yearly low-dose CT scan of the lungs is recommended for people who have at least a 30-pack-year history of smoking and are a current smoker or have quit within the past 15 years. A pack year of smoking is smoking an average of 1 pack of cigarettes a day for 1 year (for example: 1 pack a day for 30 years or 2 packs a day for 15 years). Yearly screening should continue until the smoker has stopped smoking for at least 15 years. Yearly screening should be stopped for people who develop a health problem that would prevent them from having lung cancer treatment.  If you choose to drink alcohol, do not have more than 2 drinks per day. One drink is considered to be 12 ounces (355 mL) of beer, 5 ounces (148 mL) of wine, or 1.5 ounces (44 mL) of liquor.  Avoid use of street drugs. Do not share needles with anyone. Ask   for help if you need support or instructions about stopping the use of drugs.  High blood pressure causes heart disease and increases the risk of stroke. Your blood pressure should be checked at least every 1-2 years. Ongoing high blood pressure should be treated with medicines, if weight loss and exercise are not effective.  If you are 45-79 years old, ask your health care provider if you should take aspirin to prevent heart disease.  Diabetes screening involves taking a blood sample to check your fasting blood sugar level. Testing should be considered at a younger age or be carried out more frequently if you are overweight and have at least 1 risk factor for diabetes.  Colorectal cancer can be detected and often prevented. Most routine colorectal cancer screening begins at the age of 50 and continues  through age 75. However, your health care provider may recommend screening at an earlier age if you have risk factors for colon cancer. On a yearly basis, your health care provider may provide home test kits to check for hidden blood in the stool. Use of a small camera at the end of a tube to directly examine the colon (sigmoidoscopy or colonoscopy) can detect the earliest forms of colorectal cancer. Talk to your health care provider about this at age 50, when routine screening begins. Direct exam of the colon should be repeated every 5-10 years through age 75, unless early forms of precancerous polyps or small growths are found.  Hepatitis C blood testing is recommended for all people born from 1945 through 1965 and any individual with known risks for hepatitis C.  Screening for abdominal aortic aneurysm (AAA)  by ultrasound is recommended for people who have history of high blood pressure or who are current or former smokers.  Healthy men should  receive prostate-specific antigen (PSA) blood tests as part of routine cancer screening. Talk with your health care provider about prostate cancer screening.  Testicular cancer screening is  recommended for adult males. Screening includes self-exam, a health care provider exam, and other screening tests. Consult with your health care provider about any symptoms you have or any concerns you have about testicular cancer.  Use sunscreen. Apply sunscreen liberally and repeatedly throughout the day. You should seek shade when your shadow is shorter than you. Protect yourself by wearing long sleeves, pants, a wide-brimmed hat, and sunglasses year round, whenever you are outdoors.  Once a month, do a whole-body skin exam, using a mirror to look at the skin on your back. Tell your health care provider about new moles, moles that have irregular borders, moles that are larger than a pencil eraser, or moles that have changed in shape or color.  Stay current with  required vaccines (immunizations).  Influenza vaccine. All adults should be immunized every year.  Tetanus, diphtheria, and acellular pertussis (Td, Tdap) vaccine. An adult who has not previously received Tdap or who does not know his vaccine status should receive 1 dose of Tdap. This initial dose should be followed by tetanus and diphtheria toxoids (Td) booster doses every 10 years. Adults with an unknown or incomplete history of completing a 3-dose immunization series with Td-containing vaccines should begin or complete a primary immunization series including a Tdap dose. Adults should receive a Td booster every 10 years.  Zoster vaccine. One dose is recommended for adults aged 60 years or older unless certain conditions are present.    PREVNAR - Pneumococcal 13-valent conjugate (PCV13) vaccine. When indicated, a person who is   uncertain of his immunization history and has no record of immunization should receive the PCV13 vaccine. An adult aged 19 years or older who has certain medical conditions and has not been previously immunized should receive 1 dose of PCV13 vaccine. This PCV13 should be followed with a dose of pneumococcal polysaccharide (PPSV23) vaccine. The PPSV23 vaccine dose should be obtained at least 8 weeks after the dose of PCV13 vaccine. An adult aged 19 years or older who has certain medical conditions and previously received 1 or more doses of PPSV23 vaccine should receive 1 dose of PCV13. The PCV13 vaccine dose should be obtained 1 or more years after the last PPSV23 vaccine dose.    PNEUMOVAX - Pneumococcal polysaccharide (PPSV23) vaccine. When PCV13 is also indicated, PCV13 should be obtained first. All adults aged 65 years and older should be immunized. An adult younger than age 65 years who has certain medical conditions should be immunized. Any person who resides in a nursing home or long-term care facility should be immunized. An adult smoker should be immunized. People with  an immunocompromised condition and certain other conditions should receive both PCV13 and PPSV23 vaccines. People with human immunodeficiency virus (HIV) infection should be immunized as soon as possible after diagnosis. Immunization during chemotherapy or radiation therapy should be avoided. Routine use of PPSV23 vaccine is not recommended for American Indians, [place] Natives, or people younger than 65 years unless there are medical conditions that require PPSV23 vaccine. When indicated, people who have unknown immunization and have no record of immunization should receive PPSV23 vaccine. One-time revaccination 5 years after the first dose of PPSV23 is recommended for people aged 19-64 years who have chronic kidney failure, nephrotic syndrome, asplenia, or immunocompromised conditions. People who received 1-2 doses of PPSV23 before age 65 years should receive another dose of PPSV23 vaccine at age 65 years or later if at least 5 years have passed since the previous dose. Doses of PPSV23 are not needed for people immunized with PPSV23 at or after age 65 years.    Hepatitis A vaccine. Adults who wish to be protected from this disease, have certain high-risk conditions, work with hepatitis A-infected animals, work in hepatitis A research labs, or travel to or work in countries with a high rate of hepatitis A should be immunized. Adults who were previously unvaccinated and who anticipate close contact with an international adoptee during the first 60 days after arrival in the [place] from a country with a high rate of hepatitis A should be immunized.    Hepatitis B vaccine. Adults should be immunized if they wish to be protected from this disease, have certain high-risk conditions, may be exposed to blood or other infectious body fluids, are household contacts or sex partners of hepatitis B positive people, are clients or workers in certain care facilities, or travel to or work in countries with a high  rate of hepatitis B.   Preventive Service / Frequency   Ages 65 and over  Blood pressure check.  Lipid and cholesterol check.  Lung cancer screening. / Every year if you are aged 55-80 years and have a 30-pack-year history of smoking and currently smoke or have quit within the past 15 years. Yearly screening is stopped once you have quit smoking for at least 15 years or develop a health problem that would prevent you from having lung cancer treatment.  Fecal occult blood test (FOBT) of stool. You may not have to do this test if you get a   colonoscopy every 10 years.  Flexible sigmoidoscopy** or colonoscopy.** / Every 5 years for a flexible sigmoidoscopy or every 10 years for a colonoscopy beginning at age 50 and continuing until age 75.  Hepatitis C blood test.** / For all people born from 1945 through 1965 and any individual with known risks for hepatitis C.  Abdominal aortic aneurysm (AAA) screening./ Screening current or former smokers or have Hypertension.  Skin self-exam. / Monthly.  Influenza vaccine. / Every year.  Tetanus, diphtheria, and acellular pertussis (Tdap/Td) vaccine.** / 1 dose of Td every 10 years.   Zoster vaccine.** / 1 dose for adults aged 60 years or older.         Pneumococcal 13-valent conjugate (PCV13) vaccine.    Pneumococcal polysaccharide (PPSV23) vaccine.     Hepatitis A vaccine.** / Consult your health care provider.  Hepatitis B vaccine.** / Consult your health care provider. Screening for abdominal aortic aneurysm (AAA)  by ultrasound is recommended for people who have history of high blood pressure or who are current or former smokers. 

## 2014-11-08 ENCOUNTER — Encounter: Payer: Self-pay | Admitting: Internal Medicine

## 2014-11-08 ENCOUNTER — Encounter: Payer: Medicare Other | Admitting: Cardiovascular Disease

## 2014-11-08 LAB — MICROALBUMIN / CREATININE URINE RATIO
Creatinine, Urine: 103.6 mg/dL
MICROALB UR: 2.5 mg/dL — AB (ref <2.0)
Microalb Creat Ratio: 24.1 mg/g (ref 0.0–30.0)

## 2014-11-08 LAB — BASIC METABOLIC PANEL WITH GFR
BUN: 22 mg/dL (ref 6–23)
CO2: 25 mEq/L (ref 19–32)
Calcium: 9.6 mg/dL (ref 8.4–10.5)
Chloride: 105 mEq/L (ref 96–112)
Creat: 1.14 mg/dL (ref 0.50–1.35)
GFR, Est African American: 73 mL/min
GFR, Est Non African American: 63 mL/min
GLUCOSE: 161 mg/dL — AB (ref 70–99)
POTASSIUM: 4.7 meq/L (ref 3.5–5.3)
Sodium: 141 mEq/L (ref 135–145)

## 2014-11-08 LAB — LIPID PANEL
CHOLESTEROL: 111 mg/dL (ref 0–200)
HDL: 46 mg/dL (ref >=40)
LDL Cholesterol: 46 mg/dL (ref 0–99)
TRIGLYCERIDES: 95 mg/dL (ref <150)
Total CHOL/HDL Ratio: 2.4 Ratio
VLDL: 19 mg/dL (ref 0–40)

## 2014-11-08 LAB — HEPATIC FUNCTION PANEL
ALK PHOS: 45 U/L (ref 39–117)
ALT: 15 U/L (ref 0–53)
AST: 13 U/L (ref 0–37)
Albumin: 4 g/dL (ref 3.5–5.2)
BILIRUBIN INDIRECT: 0.7 mg/dL (ref 0.2–1.2)
Bilirubin, Direct: 0.3 mg/dL (ref 0.0–0.3)
TOTAL PROTEIN: 6.2 g/dL (ref 6.0–8.3)
Total Bilirubin: 1 mg/dL (ref 0.2–1.2)

## 2014-11-08 LAB — URINALYSIS, MICROSCOPIC ONLY
Bacteria, UA: NONE SEEN
Casts: NONE SEEN
Crystals: NONE SEEN
SQUAMOUS EPITHELIAL / LPF: NONE SEEN

## 2014-11-08 LAB — MAGNESIUM: Magnesium: 1.8 mg/dL (ref 1.5–2.5)

## 2014-11-08 LAB — TSH: TSH: 1.592 u[IU]/mL (ref 0.350–4.500)

## 2014-11-08 LAB — INSULIN, RANDOM: INSULIN: 27 u[IU]/mL — AB (ref 2.0–19.6)

## 2014-11-08 LAB — VITAMIN D 25 HYDROXY (VIT D DEFICIENCY, FRACTURES): Vit D, 25-Hydroxy: 76 ng/mL (ref 30–100)

## 2014-11-08 NOTE — Progress Notes (Signed)
Patient ID: Robert Schaefer, male   DOB: 27-Jun-1941, 74 y.o.   MRN: [id number]  [facility] VISIT AND CPE  Assessment:   1. Essential hypertension  - Microalbumin / creatinine urine ratio - EKG 12-Lead - [place], RETROPERITNL ABD,  LTD  2. Hyperlipidemia  - Lipid panel  3. Prediabetes  - Hemoglobin A1c - Insulin, random  4. Vitamin D deficiency  - Vit D  25 hydroxy (rtn osteoporosis monitoring)  5. Hypothyroidism, unspecified hypothyroidism type  - TSH  6. Gastroesophageal reflux disease   7. COPD   8. OSA (obstructive sleep apnea)   9. BPH w/LUTS   10. Screening for rectal cancer  - POC Hemoccult Bld/Stl (3-Cd Home Screen); Future  11. Elevated PSA   12. Prostate cancer screening   13. Depression screen   14. At low risk for fall   15. Routine general medical examination at a health care facility   16. Medication management  - Urine Microscopic - CBC with Differential/Platelet - BASIC METABOLIC PANEL WITH GFR - Hepatic function panel - Magnesium  Plan:   During the course of the visit the patient was educated and counseled about appropriate screening and preventive services including:    Pneumococcal vaccine   Influenza vaccine  Td vaccine  Screening electrocardiogram  Bone densitometry screening  Colorectal cancer screening  Diabetes screening  Glaucoma screening  Nutrition counseling   Advanced directives: requested  Screening recommendations, referrals: Vaccinations: Immunization History  Administered Date(s) Administered  . DT 09/22/2013  . Influenza, High Dose Seasonal PF 04/11/2014  . Zoster 06/30/2005  Pneumococcal vaccine 2004 Prevnar vaccine undecided Hep B vaccine not indicated  Nutrition assessed and recommended  Colonoscopy 10/2009 Recommended yearly ophthalmology/optometry visit for glaucoma screening and checkup Recommended yearly dental visit for hygiene and checkup Advanced directives - no -  offered forms  Conditions/risks identified: BMI: Discussed weight loss, diet, and increase physical activity.  Increase physical activity: AHA recommends 150 minutes of physical activity a week.  Medications reviewed PreDiabetes is at goal, ACE/ARB therapy: Yes. Urinary Incontinence is not an issue: discussed non pharmacology and pharmacology options.  Fall risk: low- discussed PT, home fall assessment, medications.   Subjective:    Robert Schaefer presents for Medicare Annual Wellness Visit and complete physical.  Date of last medicare wellness visit was 09/22/2013.  This very nice 74 y.o. MWM presents for  follow up with Hypertension, Hyperlipidemia, Pre-Diabetes and Vitamin D Deficiency.  Patient has GERD controlled with diet. He also is maintained on low dose steroids for his COPD and is relatively asymptomatic.   Patient is treated for HTN since 1995 & BP has been controlled at home. Today's BP: 138/60 mmHg. Patient has had no complaints of any cardiac type chest pain, palpitations, dyspnea/orthopnea/PND, dizziness, claudication, or dependent edema.   Hyperlipidemia is controlled with diet & meds. Patient denies myalgias or other med SE's. Last Lipids were at goal - Total  Chol 111; HDL 46; LDL 46; Trig 95 on 11/07/2014.   Also, the patient has history of PreDiabetes since 2002 with A1c 6.1% and he's attempted management by diet and has had no symptoms of reactive hypoglycemia, diabetic polys, paresthesias or visual blurring.  Last A1c was  5.9% on 11/07/2014.    Further, the patient also has history of Vitamin D Deficiency and supplements vitamin D without any suspected side-effects. Last vitamin D was 84 on 04/11/2014.   Names of Other Physician/Practitioners you currently use: 1. [facility] here for  primary care 2. Dr Truman Hayward, OD, eye doctor, last visit 2015 3. Dr Arita Miss, DDS, dentist, last visit 2015 - every 6 months  Patient Care Team: [name], MD as PCP - General (Internal Medicine) Teena Irani, MD as Consulting Physician (Gastroenterology) Jari Pigg, MD as Consulting Physician (Dermatology) Larey Dresser, MD as Consulting Physician (Cardiology) Carolan Clines, MD as Consulting Physician (Urology) Erroll Luna, MD as Consulting Physician (General Surgery) Josue Hector, MD as Consulting Physician (Cardiology)  Medication Review: Medication Sig  . alfuzosin (UROXATRAL) 10 MG  Take 10 mg by mouth every evening.  [name] aspirin 81 MG  Chew 81 mg by mouth daily.  [name] VITAMIN D  Take 7,000 Int'l Units by mouth daily.  . fluticasone (FLONASE)  nasal spray USE TWO SPRAYS IN EACH NOSTRIL EVERY DAY  . levothyroxine  50 MCG tablet TAKE ONE TABLET BY MOUTH ONCE DAILY  . Lisinopril  20 MG tablet TAKE ONE TABLET BY MOUTH ONCE DAILY  . predniSONE  10 MG tablet take one tablet every other day  . simvastatin 20 MG tablet TAKE ONE TABLET BY MOUTH EVERY DAY   Current Problems (verified) Patient Active Problem List   Diagnosis Date Noted  . Hypothyroidism 11/07/2014  . COPD 11/07/2014  . BPH w/LUTS 11/07/2014  . Medication management 04/11/2014  . Vitamin D deficiency 07/05/2013  . Hyperlipidemia   . Prediabetes   . Asthma   . IBS (irritable bowel syndrome)   . RLS (restless legs syndrome)   . OSA (obstructive sleep apnea)   . Aortic valve disorder 10/01/2009  . CAROTID BRUIT 10/01/2009  . Essential hypertension 09/28/2009  . GERD 09/28/2009    Screening Tests Health Maintenance  Topic Date Due  . PNA vac Low Risk Adult (1 of 2 - PCV13) 02/22/2005  . INFLUENZA VACCINE  01/29/2015  . COLONOSCOPY  10/31/2019  . TETANUS/TDAP  09/23/2023  . ZOSTAVAX  Completed    Immunization History  Administered Date(s) Administered  . DT 09/22/2013  . Influenza, High Dose Seasonal PF 04/11/2014  . Zoster 06/30/2005    Preventative care: Last colonoscopy: 10/2009  History reviewed: allergies, current medications, past  family history, past medical history, past social history, past surgical history and problem list  Risk Factors: Tobacco History  Substance Use Topics  . Smoking status: Former Smoker    Quit date: 07/05/2005  . Smokeless tobacco: Never Used  . Alcohol Use: No   He does not smoke.  Patient is a former smoker. Are there smokers in your home (other than you)?  No  Alcohol Current alcohol use: none  Caffeine Current caffeine use: denies use  Exercise Current exercise: walking and yard work  Nutrition/Diet Current diet: in general, a "healthy" diet    Cardiac risk factors: advanced age (older than 63 for men, 43 for women), dyslipidemia, hypertension, male gender, sedentary lifestyle and smoking/ tobacco exposure.  Depression Screen (Note: if answer to either of the following is "Yes", a more complete depression screening is indicated)   Q1: Over the past two weeks, have you felt down, depressed or hopeless? No  Q2: Over the past two weeks, have you felt little interest or pleasure in doing things? No  Have you lost interest or pleasure in daily life? No  Do you often feel hopeless? No  Do you cry easily over simple problems? No  Activities of Daily Living In your present state of health, do you have any difficulty performing the following activities?:  Driving?  No Managing money?  No Feeding yourself? No Getting from bed to chair? No Climbing a flight of stairs? No Preparing food and eating?: No Bathing or showering? No Getting dressed: No Getting to the toilet? No Using the toilet:No Moving around from place to place: No In the past year have you fallen or had a near fall?:No   Are you sexually active?  No  Do you have more than one partner?  No  Vision Difficulties: No  Hearing Difficulties: No Do you often ask people to speak up or repeat themselves? No Do you experience ringing or noises in your ears? No Do you have difficulty understanding soft or  whispered voices? No  Cognition  Do you feel that you have a problem with memory?No  Do you often misplace items? No  Do you feel safe at home?  Yes  Advanced directives Does patient have a [place]? No - offered forms Does patient have a Living Will? No - offered forms  Past Medical History  Diagnosis Date  . Hypertension   . Hyperlipidemia   . GERD (gastroesophageal reflux disease)   . Prediabetes   . Asthma   . IBS (irritable bowel syndrome)   . Thyroid disease   . RLS (restless legs syndrome)   . Atherosclerosis of aorta     via CXR  . OSA (obstructive sleep apnea)     has a cpap-canot use it  . COPD (chronic obstructive pulmonary disease)   . Wears glasses   . Wears partial dentures     top  . Seasonal allergies   . Arthritis    Past Surgical History  Procedure Laterality Date  . Cataract extraction Bilateral   . Colonoscopy    . Upper gi endoscopy    . Pilonidal cyst / sinus excision  2000    spine  . Insertion of mesh Right 02/23/2014    Procedure: INSERTION OF MESH;  Surgeon: Joyice Faster. Cornett, MD;  Location: [place];  Service: General;  Laterality: Right;  . Inguinal hernia repair Right 02/23/2014    Procedure: HERNIA REPAIR INGUINAL ADULT;  Surgeon: Joyice Faster. Cornett, MD;  Location: [place];  Service: General;  Laterality: Right;   ROS: Constitutional: Denies fever, chills, weight loss/gain, headaches, insomnia, fatigue, night sweats or change in appetite. Eyes: Denies redness, blurred vision, diplopia, discharge, itchy or watery eyes.  ENT: Denies discharge, congestion, post nasal drip, epistaxis, sore throat, earache, hearing loss, dental pain, Tinnitus, Vertigo, Sinus pain or snoring.  Cardio: Denies chest pain, palpitations, irregular heartbeat, syncope, dyspnea, diaphoresis, orthopnea, PND, claudication or edema Respiratory: denies cough, dyspnea, DOE, pleurisy, hoarseness, laryngitis or wheezing.   Gastrointestinal: Denies dysphagia, heartburn, reflux, water brash, pain, cramps, nausea, vomiting, bloating, diarrhea, constipation, hematemesis, melena, hematochezia, jaundice or hemorrhoids Genitourinary: Denies dysuria, frequency, urgency, nocturia, hesitancy, discharge, hematuria or flank pain Musculoskeletal: Denies arthralgia, myalgia, stiffness, Jt. Swelling, pain, limp or strain/sprain. Denies Falls. Skin: Denies puritis, rash, hives, warts, acne, eczema or change in skin lesion Neuro: No weakness, tremor, incoordination, spasms, paresthesia or pain Psychiatric: Denies confusion, memory loss or sensory loss. Denies Depression. Endocrine: Denies change in weight, skin, hair change, nocturia, and paresthesia, diabetic polys, visual blurring or hyper / hypo glycemic episodes.  Heme/Lymph: No excessive bleeding, bruising or enlarged lymph nodes.  Objective:     BP 138/60 mmHg  Pulse 80  Temp(Src) 97.7 F (36.5 C)  Resp 16  Ht 5\' 6"  (1.676 m)  Wt 185 lb (83.915 kg)  BMI 29.87 kg/m2  General Appearance:  Alert  WD/WN, male  in no apparent distress. Eyes: PERRLA, EOMs nl, conjunctiva normal, normal fundi and vessels. Sinuses: No frontal/maxillary tenderness ENT/Mouth: EACs patent / TMs  nl. Nares clear without erythema, swelling, mucoid exudates. Oral hygiene is good. No erythema, swelling, or exudate. Tongue normal, non-obstructing. Tonsils not swollen or erythematous. Hearing normal.  Neck: Supple, thyroid normal. No bruits, nodes or JVD. Respiratory: Respiratory effort normal.  BS equal and clear bilateral without rales, rhonci, wheezing or stridor. Cardio: Heart sounds are normal with regular rate and rhythm and no murmurs, rubs or gallops. Peripheral pulses are normal and equal bilaterally without edema. No aortic or femoral bruits. Chest: symmetric with normal excursions and percussion.  Abdomen: Flat, soft, with nl bowel sounds. Nontender, no guarding, rebound, hernias, masses,  or organomegaly.  Lymphatics: Non tender without lymphadenopathy.  Genitourinary: No hernias.Testes nl. DRE - prostate 1-2(+) enlarged - smooth & firm w/o nodules. Musculoskeletal: Full ROM all peripheral extremities, joint stability, 5/5 strength, and normal gait. Skin: Warm and dry without rashes, lesions, cyanosis, clubbing or  ecchymosis.  Neuro: Cranial nerves intact, reflexes equal bilaterally. Normal muscle tone, no cerebellar symptoms. Sensation intact.  Pysch: Alert and oriented X 3 with normal affect, insight and judgment appropriate.   Cognitive Testing  Alert? Yes  Normal Appearance? Yes  Oriented to person? Yes  Place? Yes   Time? Yes  Recall of three objects?  Yes  Can perform simple calculations? Yes  Displays appropriate judgment? Yes  Can read the correct time from a watch/clock? Yes  Medicare Attestation I have personally reviewed: The patient's medical and social history Their use of alcohol, tobacco or illicit drugs Their current medications and supplements The patient's functional ability including ADLs,fall risks, home safety risks, cognitive, and hearing and visual impairment Diet and physical activities Evidence for depression or mood disorders  The patient's weight, height, BMI, and visual acuity have been recorded in the chart.  I have made referrals, counseling, and provided education to the patient based on review of the above and I have provided the patient with a written personalized care plan for preventive services.  Over 40 minutes of exam, counseling, chart review was performed.  Chemeka Filice DAVID, MD   11/08/2014

## 2014-11-10 DIAGNOSIS — R972 Elevated prostate specific antigen [PSA]: Secondary | ICD-10-CM | POA: Diagnosis not present

## 2014-11-10 DIAGNOSIS — N400 Enlarged prostate without lower urinary tract symptoms: Secondary | ICD-10-CM | POA: Diagnosis not present

## 2014-11-10 DIAGNOSIS — R351 Nocturia: Secondary | ICD-10-CM | POA: Diagnosis not present

## 2014-11-16 ENCOUNTER — Encounter: Payer: Self-pay | Admitting: Cardiovascular Disease

## 2014-11-23 ENCOUNTER — Ambulatory Visit (INDEPENDENT_AMBULATORY_CARE_PROVIDER_SITE_OTHER): Payer: Medicare Other | Admitting: Internal Medicine

## 2014-11-23 ENCOUNTER — Encounter: Payer: Self-pay | Admitting: Internal Medicine

## 2014-11-23 VITALS — BP 150/64 | HR 86 | Temp 98.0°F | Resp 16 | Ht 66.0 in | Wt 186.0 lb

## 2014-11-23 DIAGNOSIS — L03119 Cellulitis of unspecified part of limb: Secondary | ICD-10-CM | POA: Diagnosis not present

## 2014-11-23 MED ORDER — DOXYCYCLINE HYCLATE 100 MG PO CAPS: 100.0000 mg | ORAL_CAPSULE | Freq: Two times a day (BID) | ORAL | Status: DC

## 2014-11-23 NOTE — Patient Instructions (Signed)

## 2014-11-23 NOTE — Progress Notes (Signed)
   Subjective:    Patient ID: Robert Schaefer, male    DOB: Jan 05, 1941, 74 y.o.   MRN: [id number]  Hand Injury   Patient presents to the office for evaluation of hand wound.  Patient reports that over the weekend he was placing mulch in the yard and had a puncture wound to his hand.  He had some bleeding and has been having some more swelling.  He has had some mild drainage which is milky and yellow.  He reports that he is up to date on tetanus.  He has been keeping a bandaid on it.  He has not tried anything.  Nothing makes his hand feel worse.      Review of Systems  Constitutional: Negative for fever, chills and fatigue.  Gastrointestinal: Negative for nausea and vomiting.  Musculoskeletal: Positive for joint swelling.  Skin: Positive for color change and wound.       Objective:   Physical Exam  Constitutional: He is oriented to person, place, and time. He appears well-developed and well-nourished. No distress.  HENT:  Head: Normocephalic and atraumatic.  Mouth/Throat: Oropharynx is clear and moist. No oropharyngeal exudate.  Eyes: Conjunctivae are normal. No scleral icterus.  Neck: Normal range of motion. Neck supple. No JVD present. No thyromegaly present.  Cardiovascular: Normal rate, regular rhythm, normal heart sounds and intact distal pulses.  Exam reveals no gallop and no friction rub.   No murmur heard. Pulmonary/Chest: Effort normal and breath sounds normal. No respiratory distress. He has no wheezes. He has no rales. He exhibits no tenderness.  Abdominal: Soft. Bowel sounds are normal. He exhibits no distension and no mass. There is no tenderness. There is no rebound and no guarding.  Lymphadenopathy:    He has no cervical adenopathy.  Neurological: He is alert and oriented to person, place, and time.  Skin: Skin is warm and dry. He is not diaphoretic.  Psychiatric: He has a normal mood and affect. His behavior is normal. Judgment and thought content normal.  Nursing note  and vitals reviewed.         Assessment & Plan:    1. Cellulitis of hand -tetanus is up to date - no evidence flexor tenosynovitis  - doxycycline (VIBRAMYCIN) 100 MG capsule; Take 1 capsule (100 mg total) by mouth 2 (two) times daily. One po bid x 7 days  Dispense: 14 capsule; Refill: 0 -pt to call for worsening somatic symptoms

## 2014-11-28 ENCOUNTER — Other Ambulatory Visit: Payer: Self-pay

## 2014-11-28 DIAGNOSIS — L03119 Cellulitis of unspecified part of limb: Secondary | ICD-10-CM

## 2014-11-28 MED ORDER — DOXYCYCLINE HYCLATE 100 MG PO CAPS: 100.0000 mg | ORAL_CAPSULE | Freq: Two times a day (BID) | ORAL | Status: DC

## 2015-01-21 ENCOUNTER — Other Ambulatory Visit: Payer: Self-pay | Admitting: Internal Medicine

## 2015-02-02 DIAGNOSIS — Z86018 Personal history of other benign neoplasm: Secondary | ICD-10-CM | POA: Diagnosis not present

## 2015-02-02 DIAGNOSIS — L570 Actinic keratosis: Secondary | ICD-10-CM | POA: Diagnosis not present

## 2015-02-02 DIAGNOSIS — D2372 Other benign neoplasm of skin of left lower limb, including hip: Secondary | ICD-10-CM | POA: Diagnosis not present

## 2015-02-02 DIAGNOSIS — L738 Other specified follicular disorders: Secondary | ICD-10-CM | POA: Diagnosis not present

## 2015-02-02 DIAGNOSIS — D225 Melanocytic nevi of trunk: Secondary | ICD-10-CM | POA: Diagnosis not present

## 2015-02-02 DIAGNOSIS — L821 Other seborrheic keratosis: Secondary | ICD-10-CM | POA: Diagnosis not present

## 2015-02-14 ENCOUNTER — Encounter: Payer: Self-pay | Admitting: Internal Medicine

## 2015-02-14 ENCOUNTER — Ambulatory Visit (INDEPENDENT_AMBULATORY_CARE_PROVIDER_SITE_OTHER): Payer: Medicare Other | Admitting: Internal Medicine

## 2015-02-14 VITALS — BP 134/58 | HR 64 | Temp 98.2°F | Resp 16 | Ht 66.0 in

## 2015-02-14 DIAGNOSIS — E559 Vitamin D deficiency, unspecified: Secondary | ICD-10-CM | POA: Diagnosis not present

## 2015-02-14 DIAGNOSIS — I10 Essential (primary) hypertension: Secondary | ICD-10-CM

## 2015-02-14 DIAGNOSIS — E785 Hyperlipidemia, unspecified: Secondary | ICD-10-CM

## 2015-02-14 DIAGNOSIS — K4090 Unilateral inguinal hernia, without obstruction or gangrene, not specified as recurrent: Secondary | ICD-10-CM

## 2015-02-14 DIAGNOSIS — R7303 Prediabetes: Secondary | ICD-10-CM

## 2015-02-14 DIAGNOSIS — Z79899 Other long term (current) drug therapy: Secondary | ICD-10-CM | POA: Diagnosis not present

## 2015-02-14 DIAGNOSIS — E039 Hypothyroidism, unspecified: Secondary | ICD-10-CM

## 2015-02-14 DIAGNOSIS — R7309 Other abnormal glucose: Secondary | ICD-10-CM | POA: Diagnosis not present

## 2015-02-14 LAB — CBC WITH DIFFERENTIAL/PLATELET
BASOS ABS: 0.1 10*3/uL (ref 0.0–0.1)
BASOS PCT: 1 % (ref 0–1)
EOS ABS: 0.1 10*3/uL (ref 0.0–0.7)
Eosinophils Relative: 1 % (ref 0–5)
HCT: 41.3 % (ref 39.0–52.0)
Hemoglobin: 14.7 g/dL (ref 13.0–17.0)
Lymphocytes Relative: 20 % (ref 12–46)
Lymphs Abs: 1.1 10*3/uL (ref 0.7–4.0)
MCH: 32.6 pg (ref 26.0–34.0)
MCHC: 35.6 g/dL (ref 30.0–36.0)
MCV: 91.6 fL (ref 78.0–100.0)
MONOS PCT: 8 % (ref 3–12)
MPV: 10.8 fL (ref 8.6–12.4)
Monocytes Absolute: 0.4 10*3/uL (ref 0.1–1.0)
NEUTROS ABS: 3.9 10*3/uL (ref 1.7–7.7)
Neutrophils Relative %: 70 % (ref 43–77)
PLATELETS: 111 10*3/uL — AB (ref 150–400)
RBC: 4.51 MIL/uL (ref 4.22–5.81)
RDW: 14.7 % (ref 11.5–15.5)
WBC: 5.5 10*3/uL (ref 4.0–10.5)

## 2015-02-14 LAB — LIPID PANEL
Cholesterol: 118 mg/dL — ABNORMAL LOW (ref 125–200)
HDL: 43 mg/dL (ref >=40)
LDL Cholesterol: 57 mg/dL (ref <130)
TRIGLYCERIDES: 88 mg/dL (ref <150)
Total CHOL/HDL Ratio: 2.7 Ratio (ref <=5.0)
VLDL: 18 mg/dL (ref <30)

## 2015-02-14 LAB — HEPATIC FUNCTION PANEL
ALBUMIN: 4.4 g/dL (ref 3.6–5.1)
ALK PHOS: 44 U/L (ref 40–115)
ALT: 14 U/L (ref 9–46)
AST: 14 U/L (ref 10–35)
Bilirubin, Direct: 0.3 mg/dL — ABNORMAL HIGH (ref <=0.2)
Indirect Bilirubin: 1 mg/dL (ref 0.2–1.2)
TOTAL PROTEIN: 6.4 g/dL (ref 6.1–8.1)
Total Bilirubin: 1.3 mg/dL — ABNORMAL HIGH (ref 0.2–1.2)

## 2015-02-14 LAB — TSH: TSH: 1.837 u[IU]/mL (ref 0.350–4.500)

## 2015-02-14 LAB — BASIC METABOLIC PANEL WITH GFR
BUN: 21 mg/dL (ref 7–25)
CHLORIDE: 106 mmol/L (ref 98–110)
CO2: 24 mmol/L (ref 20–31)
Calcium: 9.9 mg/dL (ref 8.6–10.3)
Creat: 1.12 mg/dL (ref 0.70–1.18)
GFR, EST AFRICAN AMERICAN: 74 mL/min (ref >=60)
GFR, EST NON AFRICAN AMERICAN: 64 mL/min (ref >=60)
Glucose, Bld: 101 mg/dL — ABNORMAL HIGH (ref 65–99)
Potassium: 4.2 mmol/L (ref 3.5–5.3)
SODIUM: 141 mmol/L (ref 135–146)

## 2015-02-14 LAB — MAGNESIUM: Magnesium: 2.1 mg/dL (ref 1.5–2.5)

## 2015-02-14 NOTE — Patient Instructions (Signed)

## 2015-02-14 NOTE — Progress Notes (Signed)
Patient ID: Robert Schaefer, male   DOB: [birth year], 74 y.o.   MRN: [id number]  Assessment and Plan:  Hypertension:  -Continue medication -monitor blood pressure at home. -Continue DASH diet -Reminder to go to the ER if any CP, SOB, nausea, dizziness, severe HA, changes vision/speech, left arm numbness and tingling and jaw pain.  Cholesterol - Continue diet and exercise -Check cholesterol.   Prediabetes -Continue diet and exercise.  -Check A1C  Vitamin D Def -check level -continue medications.  Possible left inguinal hernia -refer back to Dr. Brantley Stage -go to the ER if color changes, gets hard, cannot be reduced or intractable pain.   Continue diet and meds as discussed. Further disposition pending results of labs. Discussed med's effects and SE's.    HPI 74 y.o. male  presents for 3 month follow up with hypertension, hyperlipidemia, diabetes and vitamin D deficiency.   His blood pressure has been controlled at home, today their BP is BP: (!) 134/58 mmHg.He does not workout. He denies chest pain, shortness of breath, dizziness.  He does walk usually 1.5-2 miles per day.     He is on cholesterol medication and denies myalgias. His cholesterol is at goal. The cholesterol was:  11/07/2014: Cholesterol 111; HDL 46; LDL Cholesterol 46; Triglycerides 95   He has been working on diet and exercise for diabetes without complications, he is on bASA, he is on ACE/ARB, and denies  foot ulcerations, hyperglycemia, hypoglycemia , increased appetite, nausea, paresthesia of the feet, polydipsia, polyuria, visual disturbances, vomiting and weight loss. Last A1C was: 11/07/2014: Hgb A1c MFr Bld 5.9*   Patient is on Vitamin D supplement. 11/07/2014: Vit D, 25-Hydroxy 76  He reports that he has been having some left sided groin pain which has been bothering him daily.  He feels that it is very similar to when he had a hernia on the left side.  He has no testicular discomfort currently.  He does report that  the pain is a lot worse when he does anything with straining or activity.  He reports that his previous surgery was performed by Dr. Brantley Stage.  Current Medications:  Current Outpatient Prescriptions on File Prior to Visit  Medication Sig Dispense Refill  . alfuzosin (UROXATRAL) 10 MG 24 hr tablet Take 10 mg by mouth every evening.    [name] aspirin 81 MG chewable tablet Chew 81 mg by mouth daily.    . Cholecalciferol (VITAMIN D PO) Take 7,000 Int'l Units by mouth daily.    . fluticasone (FLONASE) 50 MCG/ACT nasal spray USE TWO SPRAYS IN EACH NOSTRIL EVERY DAY 16 g 3  . levothyroxine (SYNTHROID, LEVOTHROID) 50 MCG tablet TAKE ONE TABLET BY MOUTH ONCE DAILY 90 tablet 3  . lisinopril (PRINIVIL,ZESTRIL) 20 MG tablet TAKE ONE TABLET BY MOUTH ONCE DAILY 90 tablet 1  . predniSONE (DELTASONE) 10 MG tablet take one tablet every other day    . simvastatin (ZOCOR) 20 MG tablet TAKE ONE TABLET BY MOUTH EVERY DAY 90 tablet 0   No current facility-administered medications on file prior to visit.   Medical History:  Past Medical History  Diagnosis Date  . Hypertension   . Hyperlipidemia   . GERD (gastroesophageal reflux disease)   . Prediabetes   . Asthma   . IBS (irritable bowel syndrome)   . Thyroid disease   . RLS (restless legs syndrome)   . Atherosclerosis of aorta     via CXR  . OSA (obstructive sleep apnea)     has a cpap-canot  use it  . COPD (chronic obstructive pulmonary disease)   . Wears glasses   . Wears partial dentures     top  . Seasonal allergies   . Arthritis    Allergies:  Allergies  Allergen Reactions  . Penicillins Anaphylaxis and Swelling  . Sulfa Antibiotics Anaphylaxis and Swelling     Review of Systems:  Review of Systems  Constitutional: Negative for fever, chills and malaise/fatigue.  HENT: Positive for congestion. Negative for ear pain and sore throat.   Respiratory: Negative for cough, shortness of breath and wheezing.   Cardiovascular: Negative for chest  pain, palpitations and leg swelling.  Gastrointestinal: Positive for abdominal pain and constipation. Negative for diarrhea, blood in stool and melena.  Genitourinary: Positive for frequency. Negative for dysuria, urgency and hematuria.  Skin: Negative.   Neurological: Negative for dizziness, sensory change, loss of consciousness and headaches.  Psychiatric/Behavioral: Negative for depression. The patient is not nervous/anxious and does not have insomnia.     Family history- Review and unchanged  Social history- Review and unchanged  Physical Exam: BP 134/58 mmHg  Pulse 64  Temp(Src) 98.2 F (36.8 C) (Temporal)  Resp 16  Ht 5\' 6"  (1.676 m) Wt Readings from Last 3 Encounters:  11/23/14 186 lb (84.369 kg)  11/07/14 185 lb (83.915 kg)  10/03/14 188 lb (85.276 kg)   General Appearance: Well nourished well developed, non-toxic appearing, in no apparent distress. Eyes: PERRLA, EOMs, conjunctiva no swelling or erythema ENT/Mouth: Ear canals clear with no erythema, swelling, or discharge.  TMs normal bilaterally, oropharynx clear, moist, with no exudate.   Neck: Supple, thyroid normal, no JVD, no cervical adenopathy.  Respiratory: Respiratory effort normal, breath sounds clear A&P, no wheeze, rhonchi or rales noted.  No retractions, no accessory muscle usage Cardio: RRR with no RGs. 3/6 murmur radiating abdomen and carotids No noted edema.  Abdomen: Soft, + BS.  Non tender, no guarding, rebound, hernias, masses. GU:  Penis and testicles normal with visual inspection.  There is some bulging of the left side of the abdomen in the suprapubic region with tenderness to palpation.  There is no tenderness to palpation of the left testicle.  Possible left inguinal hernia Musculoskeletal: Full ROM, 5/5 strength, Normal gait Skin: Warm, dry without rashes, lesions, ecchymosis.  Neuro: Awake and oriented X 3, Cranial nerves intact. No cerebellar symptoms.  Psych: normal affect, Insight and Judgment  appropriate.    [name], PA-C 11:54 AM [facility]

## 2015-02-15 ENCOUNTER — Encounter: Payer: Self-pay | Admitting: Internal Medicine

## 2015-02-15 LAB — VITAMIN D 25 HYDROXY (VIT D DEFICIENCY, FRACTURES): VIT D 25 HYDROXY: 76 ng/mL (ref 30–100)

## 2015-02-15 LAB — HEMOGLOBIN A1C
HEMOGLOBIN A1C: 5.6 % (ref <5.7)
Mean Plasma Glucose: 114 mg/dL (ref <117)

## 2015-02-15 LAB — INSULIN, RANDOM: Insulin: 7.9 u[IU]/mL (ref 2.0–19.6)

## 2015-03-19 ENCOUNTER — Other Ambulatory Visit: Payer: Self-pay | Admitting: Physician Assistant

## 2015-04-20 ENCOUNTER — Ambulatory Visit (INDEPENDENT_AMBULATORY_CARE_PROVIDER_SITE_OTHER): Payer: Medicare Other | Admitting: Internal Medicine

## 2015-04-20 ENCOUNTER — Encounter: Payer: Self-pay | Admitting: Internal Medicine

## 2015-04-20 VITALS — BP 128/78 | HR 68 | Temp 97.9°F | Resp 16 | Ht 66.0 in | Wt 182.8 lb

## 2015-04-20 DIAGNOSIS — S8011XA Contusion of right lower leg, initial encounter: Secondary | ICD-10-CM

## 2015-04-20 DIAGNOSIS — Z23 Encounter for immunization: Secondary | ICD-10-CM

## 2015-04-20 MED ORDER — MUPIROCIN CALCIUM 2 % EX CREA: TOPICAL_CREAM | CUTANEOUS | Status: DC

## 2015-04-20 NOTE — Progress Notes (Signed)
   Subjective:    Patient ID: Robert Schaefer, male    DOB: October 19, 1940, 74 y.o.   MRN: [id number]  Fall Pertinent negatives include no fever.  Patient presents to the office for evaluation of fall out of bed 4 days ago.  He hit his leg on the rocker and did break the skin.  He reports that the skin is getting a little bit more red.  He reports that it the wound on his left shin bled very minimally.  He reports that he is able to walk and move around without much pain. He has not had any fevers chill, nausea, vomiting.    Review of Systems  Constitutional: Negative for fever, chills and fatigue.  Musculoskeletal: Positive for arthralgias.  Skin: Positive for wound. Negative for color change.       Objective:   Physical Exam  Constitutional: He is oriented to person, place, and time. He appears well-developed and well-nourished. No distress.  HENT:  Head: Normocephalic.  Mouth/Throat: Oropharynx is clear and moist. No oropharyngeal exudate.  Eyes: Conjunctivae are normal. No scleral icterus.  Neck: Normal range of motion. Neck supple. No JVD present. No thyromegaly present.  Cardiovascular: Normal rate, regular rhythm, normal heart sounds and intact distal pulses.  Exam reveals no gallop and no friction rub.   No murmur heard. Pulmonary/Chest: Effort normal and breath sounds normal. No respiratory distress. He has no wheezes. He has no rales. He exhibits no tenderness.  Musculoskeletal: Normal range of motion.       Feet:  Lymphadenopathy:    He has no cervical adenopathy.  Neurological: He is alert and oriented to person, place, and time.  Skin: Skin is warm and dry. He is not diaphoretic.     Psychiatric: He has a normal mood and affect. His behavior is normal. Judgment and thought content normal.  Nursing note and vitals reviewed.   Filed Vitals:   04/20/15 1121  BP: 128/78  Pulse: 68  Temp: 97.9 F (36.6 C)  Resp: 16          Assessment & Plan:    1. Hematoma of  leg, right, initial encounter -bactroban -warm compresses -gentle massage -elevate legs  2. Need for prophylactic vaccination and inoculation against influenza  - Flu vaccine HIGH DOSE PF (Fluzone High dose)

## 2015-04-20 NOTE — Patient Instructions (Signed)
Hematoma  A hematoma is a collection of blood under the skin, in an organ, in a body space, in a joint space, or in other tissue. The blood can clot to form a lump that you can see and feel. The lump is often firm and may sometimes become sore and tender. Most hematomas get better in a few days to weeks. However, some hematomas may be serious and require medical care. Hematomas can range in size from very small to very large.  CAUSES   A hematoma can be caused by a blunt or penetrating injury. It can also be caused by spontaneous leakage from a blood vessel under the skin. Spontaneous leakage from a blood vessel is more likely to occur in older people, especially those taking blood thinners. Sometimes, a hematoma can develop after certain medical procedures.  SIGNS AND SYMPTOMS   · A firm lump on the body.  · Possible pain and tenderness in the area.  · Bruising. Blue, dark blue, purple-red, or yellowish skin may appear at the site of the hematoma if the hematoma is close to the surface of the skin.  For hematomas in deeper tissues or body spaces, the signs and symptoms may be subtle. For example, an intra-abdominal hematoma may cause abdominal pain, weakness, fainting, and shortness of breath. An intracranial hematoma may cause a headache or symptoms such as weakness, trouble speaking, or a change in consciousness.  DIAGNOSIS   A hematoma can usually be diagnosed based on your medical history and a physical exam. Imaging tests may be needed if your health care provider suspects a hematoma in deeper tissues or body spaces, such as the abdomen, head, or chest. These tests may include ultrasonography or a CT scan.   TREATMENT   Hematomas usually go away on their own over time. Rarely does the blood need to be drained out of the body. Large hematomas or those that may affect vital organs will sometimes need surgical drainage or monitoring.  HOME CARE INSTRUCTIONS   · Apply ice to the injured area:      Put ice in a  plastic bag.      Place a towel between your skin and the bag.      Leave the ice on for 20 minutes, 2-3 times a day for the first 1 to 2 days.    · After the first 2 days, switch to using warm compresses on the hematoma.    · Elevate the injured area to help decrease pain and swelling. Wrapping the area with an elastic bandage may also be helpful. Compression helps to reduce swelling and promotes shrinking of the hematoma. Make sure the bandage is not wrapped too tight.    · If your hematoma is on a lower extremity and is painful, crutches may be helpful for a couple days.    · Only take over-the-counter or prescription medicines as directed by your health care provider.  SEEK IMMEDIATE MEDICAL CARE IF:   · You have increasing pain, or your pain is not controlled with medicine.    · You have a fever.    · You have worsening swelling or discoloration.    · Your skin over the hematoma breaks or starts bleeding.    · Your hematoma is in your chest or abdomen and you have weakness, shortness of breath, or a change in consciousness.  · Your hematoma is on your scalp (caused by a fall or injury) and you have a worsening headache or a change in alertness or consciousness.  MAKE SURE YOU:   ·   Understand these instructions.  · Will watch your condition.  · Will get help right away if you are not doing well or get worse.     This information is not intended to replace advice given to you by your health care provider. Make sure you discuss any questions you have with your health care provider.     Document Released: 01/29/2004 Document Revised: 02/16/2013 Document Reviewed: 11/24/2012  Elsevier Interactive Patient Education ©2016 [employer]

## 2015-06-01 ENCOUNTER — Ambulatory Visit (INDEPENDENT_AMBULATORY_CARE_PROVIDER_SITE_OTHER): Payer: Medicare Other | Admitting: Internal Medicine

## 2015-06-01 ENCOUNTER — Encounter: Payer: Self-pay | Admitting: Internal Medicine

## 2015-06-01 VITALS — BP 136/56 | HR 84 | Temp 98.1°F | Resp 16 | Ht 66.0 in | Wt 181.2 lb

## 2015-06-01 DIAGNOSIS — Z79899 Other long term (current) drug therapy: Secondary | ICD-10-CM | POA: Diagnosis not present

## 2015-06-01 DIAGNOSIS — K219 Gastro-esophageal reflux disease without esophagitis: Secondary | ICD-10-CM

## 2015-06-01 DIAGNOSIS — R7303 Prediabetes: Secondary | ICD-10-CM | POA: Diagnosis not present

## 2015-06-01 DIAGNOSIS — E039 Hypothyroidism, unspecified: Secondary | ICD-10-CM

## 2015-06-01 DIAGNOSIS — Z6829 Body mass index (BMI) 29.0-29.9, adult: Secondary | ICD-10-CM

## 2015-06-01 DIAGNOSIS — I10 Essential (primary) hypertension: Secondary | ICD-10-CM

## 2015-06-01 DIAGNOSIS — J449 Chronic obstructive pulmonary disease, unspecified: Secondary | ICD-10-CM

## 2015-06-01 DIAGNOSIS — E785 Hyperlipidemia, unspecified: Secondary | ICD-10-CM

## 2015-06-01 DIAGNOSIS — E559 Vitamin D deficiency, unspecified: Secondary | ICD-10-CM

## 2015-06-01 DIAGNOSIS — G4733 Obstructive sleep apnea (adult) (pediatric): Secondary | ICD-10-CM | POA: Diagnosis not present

## 2015-06-01 DIAGNOSIS — R7309 Other abnormal glucose: Secondary | ICD-10-CM

## 2015-06-01 LAB — CBC WITH DIFFERENTIAL/PLATELET
BASOS PCT: 0 % (ref 0–1)
Basophils Absolute: 0 10*3/uL (ref 0.0–0.1)
EOS ABS: 0.1 10*3/uL (ref 0.0–0.7)
EOS PCT: 1 % (ref 0–5)
HCT: 41 % (ref 39.0–52.0)
Hemoglobin: 14.8 g/dL (ref 13.0–17.0)
LYMPHS ABS: 0.8 10*3/uL (ref 0.7–4.0)
Lymphocytes Relative: 7 % — ABNORMAL LOW (ref 12–46)
MCH: 33 pg (ref 26.0–34.0)
MCHC: 36.1 g/dL — AB (ref 30.0–36.0)
MCV: 91.5 fL (ref 78.0–100.0)
MONOS PCT: 4 % (ref 3–12)
MPV: 10.9 fL (ref 8.6–12.4)
Monocytes Absolute: 0.4 10*3/uL (ref 0.1–1.0)
NEUTROS PCT: 88 % — AB (ref 43–77)
Neutro Abs: 9.6 10*3/uL — ABNORMAL HIGH (ref 1.7–7.7)
PLATELETS: 119 10*3/uL — AB (ref 150–400)
RBC: 4.48 MIL/uL (ref 4.22–5.81)
RDW: 14.4 % (ref 11.5–15.5)
WBC: 10.9 10*3/uL — ABNORMAL HIGH (ref 4.0–10.5)

## 2015-06-01 LAB — HEPATIC FUNCTION PANEL
ALBUMIN: 4.4 g/dL (ref 3.6–5.1)
ALT: 11 U/L (ref 9–46)
AST: 13 U/L (ref 10–35)
Alkaline Phosphatase: 53 U/L (ref 40–115)
BILIRUBIN DIRECT: 0.3 mg/dL — AB (ref <=0.2)
BILIRUBIN TOTAL: 1.3 mg/dL — AB (ref 0.2–1.2)
Indirect Bilirubin: 1 mg/dL (ref 0.2–1.2)
Total Protein: 6.8 g/dL (ref 6.1–8.1)

## 2015-06-01 LAB — BASIC METABOLIC PANEL WITH GFR
BUN: 17 mg/dL (ref 7–25)
CALCIUM: 9.8 mg/dL (ref 8.6–10.3)
CO2: 21 mmol/L (ref 20–31)
CREATININE: 1.04 mg/dL (ref 0.70–1.18)
Chloride: 106 mmol/L (ref 98–110)
GFR, EST AFRICAN AMERICAN: 81 mL/min (ref >=60)
GFR, Est Non African American: 70 mL/min (ref >=60)
Glucose, Bld: 183 mg/dL — ABNORMAL HIGH (ref 65–99)
Potassium: 4 mmol/L (ref 3.5–5.3)
SODIUM: 140 mmol/L (ref 135–146)

## 2015-06-01 LAB — MAGNESIUM: MAGNESIUM: 1.9 mg/dL (ref 1.5–2.5)

## 2015-06-01 LAB — LIPID PANEL
CHOL/HDL RATIO: 2.8 ratio (ref <=5.0)
Cholesterol: 130 mg/dL (ref 125–200)
HDL: 47 mg/dL (ref >=40)
LDL Cholesterol: 69 mg/dL (ref <130)
Triglycerides: 71 mg/dL (ref <150)
VLDL: 14 mg/dL (ref <30)

## 2015-06-01 LAB — HEMOGLOBIN A1C
Hgb A1c MFr Bld: 5.6 % (ref <5.7)
Mean Plasma Glucose: 114 mg/dL (ref <117)

## 2015-06-01 LAB — TSH: TSH: 1.666 u[IU]/mL (ref 0.350–4.500)

## 2015-06-01 NOTE — Patient Instructions (Signed)

## 2015-06-02 ENCOUNTER — Encounter: Payer: Self-pay | Admitting: Internal Medicine

## 2015-06-02 LAB — INSULIN, RANDOM: INSULIN: 27.1 u[IU]/mL — AB (ref 2.0–19.6)

## 2015-06-02 LAB — VITAMIN D 25 HYDROXY (VIT D DEFICIENCY, FRACTURES): VIT D 25 HYDROXY: 78 ng/mL (ref 30–100)

## 2015-06-02 NOTE — Progress Notes (Signed)
Patient ID: Robert Schaefer, male   DOB: 01/09/1941, 74 y.o.   MRN: [id number]   This very nice 74 y.o. MWM presents for 3 month follow up with Hypertension, Hyperlipidemia, Pre-Diabetes, COPD, OSA/CPAP, GERD, IBS, BPH, Hypothyroidism  and Vitamin D Deficiency.    Patient is treated for HTN since 1995 & BP has been controlled at home. Today's BP: (!) 136/56 mmHg. Patient has had no complaints of any cardiac type chest pain, palpitations, dyspnea/orthopnea/PND, dizziness, claudication, or dependent edema.   Hyperlipidemia is controlled with diet & meds. Patient denies myalgias or other med SE's. Last Lipids were at goal with  Cholesterol 130; HDL 47; LDL 69; Triglycerides 71 on 06/01/2015.   Also, the patient has history of PreDiabetes since 2002 with A1c 6.1% and has had no symptoms of reactive hypoglycemia, diabetic polys, paresthesias or visual blurring.  Last A1c was  5.6% on 06/01/2015.    Further, the patient also has history of Vitamin D Deficiency and supplements vitamin D without any suspected side-effects. Last vitamin D was 78 on 06/01/2015.  Medication Sig  . alfuzosin (UROXATRAL) 10 MG 24 hr tablet Take 10 mg by mouth every evening.  [name] aspirin 81 MG chewable tablet Chew 81 mg by mouth daily.  . Cholecalciferol (VITAMIN D PO) Take 7,000 Int'l Units by mouth daily.  . fluticasone (FLONASE) 50 MCG/ACT nasal spray USE TWO SPRAYS IN EACH NOSTRIL EVERY DAY  . levothyroxine  50 MCG tablet TAKE ONE TABLET BY MOUTH ONCE DAILY  . lisinopril  20 MG tablet TAKE ONE TABLET BY MOUTH ONCE DAILY  . mupirocin cream (BACTROBAN) 2 % Apply to affected area 3 times daily  . predniSONE  10 MG tablet TAKE ONE & ONE-HALF TABLETS BY MOUTH EVERY OTHER DAY OR AS DIRECTED  . simvastatin (ZOCOR) 20 MG tablet TAKE ONE TABLET BY MOUTH EVERY DAY   Allergies  Allergen Reactions  . Penicillins Anaphylaxis and Swelling  . Sulfa Antibiotics Anaphylaxis and Swelling   PMHx:   Past Medical History  Diagnosis Date  .  Hypertension   . Hyperlipidemia   . GERD (gastroesophageal reflux disease)   . Prediabetes   . Asthma   . IBS (irritable bowel syndrome)   . Thyroid disease   . RLS (restless legs syndrome)   . Atherosclerosis of aorta ([place])     via CXR  . OSA (obstructive sleep apnea)     has a cpap-canot use it  . COPD (chronic obstructive pulmonary disease) ([place])   . Wears glasses   . Wears partial dentures     top  . Seasonal allergies   . Arthritis    Immunization History  Administered Date(s) Administered  . DT 09/22/2013  . Influenza, High Dose Seasonal PF 04/11/2014, 04/20/2015  . Zoster 06/30/2005   Past Surgical History  Procedure Laterality Date  . Cataract extraction Bilateral   . Colonoscopy    . Upper gi endoscopy    . Pilonidal cyst / sinus excision  2000    spine  . Insertion of mesh Right 02/23/2014    Procedure: INSERTION OF MESH;  Surgeon: Joyice Faster. Cornett, MD;  Location: [facility];  Service: General;  Laterality: Right;  . Inguinal hernia repair Right 02/23/2014    Procedure: HERNIA REPAIR INGUINAL ADULT;  Surgeon: Joyice Faster. Cornett, MD;  Location: [facility];  Service: General;  Laterality: Right;   FHx:    Reviewed / unchanged  SHx:    Reviewed / unchanged  Systems  Review:  Constitutional: Denies fever, chills, wt changes, headaches, insomnia, fatigue, night sweats, change in appetite. Eyes: Denies redness, blurred vision, diplopia, discharge, itchy, watery eyes.  ENT: Denies discharge, congestion, post nasal drip, epistaxis, sore throat, earache, hearing loss, dental pain, tinnitus, vertigo, sinus pain, snoring.  CV: Denies chest pain, palpitations, irregular heartbeat, syncope, dyspnea, diaphoresis, orthopnea, PND, claudication or edema. Respiratory: denies cough, dyspnea, DOE, pleurisy, hoarseness, laryngitis, wheezing.  Gastrointestinal: Denies dysphagia, odynophagia, heartburn, reflux, water brash, abdominal pain or cramps,  nausea, vomiting, bloating, diarrhea, constipation, hematemesis, melena, hematochezia  or hemorrhoids. Genitourinary: Denies dysuria, frequency, urgency, nocturia, hesitancy, discharge, hematuria or flank pain. Musculoskeletal: Denies arthralgias, myalgias, stiffness, jt. swelling, pain, limping or strain/sprain.  Skin: Denies pruritus, rash, hives, warts, acne, eczema or change in skin lesion(s). Neuro: No weakness, tremor, incoordination, spasms, paresthesia or pain. Psychiatric: Denies confusion, memory loss or sensory loss. Endo: Denies change in weight, skin or hair change.  Heme/Lymph: No excessive bleeding, bruising or enlarged lymph nodes.  Physical Exam  BP 136/56 mmHg  Pulse 84  Temp(Src) 98.1 F (36.7 C)  Resp 16  Ht 5\' 6"  (1.676 m)  Wt 181 lb 3.2 oz (82.192 kg)  BMI 29.26 kg/m2  Appears well nourished and in no distress. Eyes: PERRLA, EOMs, conjunctiva no swelling or erythema. Sinuses: No frontal/maxillary tenderness ENT/Mouth: EAC's clear, TM's nl w/o erythema, bulging. Nares clear w/o erythema, swelling, exudates. Oropharynx clear without erythema or exudates. Oral hygiene is good. Tongue normal, non obstructing. Hearing intact.  Neck: Supple. Thyroid nl. Car 2+/2+ without bruits, nodes or JVD. Chest: Respirations nl with BS clear & equal w/o rales, rhonchi, wheezing or stridor.  Cor: Heart sounds normal w/ regular rate and rhythm without sig. murmurs, gallops, clicks, or rubs. Peripheral pulses normal and equal  without edema.  Abdomen: Soft & bowel sounds normal. Non-tender w/o guarding, rebound, hernias, masses, or organomegaly.  Lymphatics: Unremarkable.  Musculoskeletal: Full ROM all peripheral extremities, joint stability, 5/5 strength, and normal gait.  Skin: Warm, dry without exposed rashes, lesions or ecchymosis apparent.  Neuro: Cranial nerves intact, reflexes equal bilaterally. Sensory-motor testing grossly intact. Tendon reflexes grossly intact.  Pysch: Alert  & oriented x 3.  Insight and judgement nl & appropriate. No ideations.  Assessment and Plan:  1. Essential hypertension  - TSH  2. Hyperlipidemia  - Lipid panel - TSH  3. Prediabetes  - Hemoglobin A1c - Insulin, random  4. Vitamin D deficiency  - VITAMIN D 25 Hydroxy   5. Hypothyroidism   6. OSA (obstructive sleep apnea)   7. COPD   8. Gastroesophageal reflux disease   9. BMI 29.0-29.9,adult   10. Other abnormal glucose  - Hemoglobin A1c - Insulin, random  11. Medication management  - CBC with Differential/Platelet - BASIC METABOLIC PANEL WITH GFR - Hepatic function panel - Magnesium      Recommended regular exercise, BP monitoring, weight control, and discussed med and SE's. Recommended labs to assess and monitor clinical status. Further disposition pending results of labs. Over 30 minutes of exam, counseling, chart review was performed

## 2015-06-27 ENCOUNTER — Other Ambulatory Visit: Payer: Self-pay | Admitting: Physician Assistant

## 2015-06-29 ENCOUNTER — Encounter: Payer: Self-pay | Admitting: *Deleted

## 2015-08-29 ENCOUNTER — Other Ambulatory Visit: Payer: Self-pay | Admitting: Physician Assistant

## 2015-09-04 ENCOUNTER — Ambulatory Visit (INDEPENDENT_AMBULATORY_CARE_PROVIDER_SITE_OTHER): Payer: Medicare Other | Admitting: Physician Assistant

## 2015-09-04 ENCOUNTER — Encounter: Payer: Self-pay | Admitting: Physician Assistant

## 2015-09-04 VITALS — BP 140/60 | HR 83 | Temp 97.9°F | Resp 16 | Ht 66.0 in | Wt 184.8 lb

## 2015-09-04 DIAGNOSIS — R05 Cough: Secondary | ICD-10-CM

## 2015-09-04 DIAGNOSIS — Z79899 Other long term (current) drug therapy: Secondary | ICD-10-CM

## 2015-09-04 DIAGNOSIS — E785 Hyperlipidemia, unspecified: Secondary | ICD-10-CM

## 2015-09-04 DIAGNOSIS — E039 Hypothyroidism, unspecified: Secondary | ICD-10-CM | POA: Diagnosis not present

## 2015-09-04 DIAGNOSIS — E559 Vitamin D deficiency, unspecified: Secondary | ICD-10-CM | POA: Diagnosis not present

## 2015-09-04 DIAGNOSIS — R7309 Other abnormal glucose: Secondary | ICD-10-CM

## 2015-09-04 DIAGNOSIS — J449 Chronic obstructive pulmonary disease, unspecified: Secondary | ICD-10-CM

## 2015-09-04 DIAGNOSIS — I10 Essential (primary) hypertension: Secondary | ICD-10-CM | POA: Diagnosis not present

## 2015-09-04 DIAGNOSIS — R059 Cough, unspecified: Secondary | ICD-10-CM

## 2015-09-04 LAB — BASIC METABOLIC PANEL WITH GFR
BUN: 18 mg/dL (ref 7–25)
CALCIUM: 9.7 mg/dL (ref 8.6–10.3)
CO2: 25 mmol/L (ref 20–31)
Chloride: 106 mmol/L (ref 98–110)
Creat: 1.12 mg/dL (ref 0.70–1.18)
GFR, EST AFRICAN AMERICAN: 74 mL/min (ref >=60)
GFR, EST NON AFRICAN AMERICAN: 64 mL/min (ref >=60)
GLUCOSE: 151 mg/dL — AB (ref 65–99)
Potassium: 4 mmol/L (ref 3.5–5.3)
SODIUM: 142 mmol/L (ref 135–146)

## 2015-09-04 LAB — HEPATIC FUNCTION PANEL
ALT: 16 U/L (ref 9–46)
AST: 15 U/L (ref 10–35)
Albumin: 4.2 g/dL (ref 3.6–5.1)
Alkaline Phosphatase: 58 U/L (ref 40–115)
BILIRUBIN DIRECT: 0.2 mg/dL (ref <=0.2)
BILIRUBIN INDIRECT: 0.6 mg/dL (ref 0.2–1.2)
Total Bilirubin: 0.8 mg/dL (ref 0.2–1.2)
Total Protein: 6.7 g/dL (ref 6.1–8.1)

## 2015-09-04 LAB — CBC WITH DIFFERENTIAL/PLATELET
BASOS PCT: 1 % (ref 0–1)
Basophils Absolute: 0 10*3/uL (ref 0.0–0.1)
EOS PCT: 2 % (ref 0–5)
Eosinophils Absolute: 0.1 10*3/uL (ref 0.0–0.7)
HCT: 41.7 % (ref 39.0–52.0)
Hemoglobin: 14.8 g/dL (ref 13.0–17.0)
Lymphocytes Relative: 17 % (ref 12–46)
Lymphs Abs: 0.8 10*3/uL (ref 0.7–4.0)
MCH: 32.7 pg (ref 26.0–34.0)
MCHC: 35.5 g/dL (ref 30.0–36.0)
MCV: 92.3 fL (ref 78.0–100.0)
MONO ABS: 0.3 10*3/uL (ref 0.1–1.0)
MPV: 10.5 fL (ref 8.6–12.4)
Monocytes Relative: 6 % (ref 3–12)
Neutro Abs: 3.5 10*3/uL (ref 1.7–7.7)
Neutrophils Relative %: 74 % (ref 43–77)
Platelets: 134 10*3/uL — ABNORMAL LOW (ref 150–400)
RBC: 4.52 MIL/uL (ref 4.22–5.81)
RDW: 14.1 % (ref 11.5–15.5)
WBC: 4.7 10*3/uL (ref 4.0–10.5)

## 2015-09-04 LAB — LIPID PANEL
CHOLESTEROL: 134 mg/dL (ref 125–200)
HDL: 41 mg/dL (ref >=40)
LDL Cholesterol: 76 mg/dL (ref <130)
TRIGLYCERIDES: 83 mg/dL (ref <150)
Total CHOL/HDL Ratio: 3.3 Ratio (ref <=5.0)
VLDL: 17 mg/dL (ref <30)

## 2015-09-04 LAB — TSH: TSH: 2.25 m[IU]/L (ref 0.40–4.50)

## 2015-09-04 LAB — MAGNESIUM: Magnesium: 1.9 mg/dL (ref 1.5–2.5)

## 2015-09-04 MED ORDER — LOSARTAN POTASSIUM 100 MG PO TABS: ORAL_TABLET | ORAL | Status: DC

## 2015-09-04 NOTE — Progress Notes (Signed)
Patient ID: Robert Schaefer, male   DOB: 07-20-1940, 75 y.o.   MRN: [id number]  Assessment and Plan:  Hypertension:  -Continue medication -monitor blood pressure at home. -Continue DASH diet -Reminder to go to the ER if any CP, SOB, nausea, dizziness, severe HA, changes vision/speech, left arm numbness and tingling and jaw pain.  Cholesterol - Continue diet and exercise -Check cholesterol.   Prediabetes -Continue diet and exercise.  -Check A1C  Vitamin D Def -check level -continue medications.  Hypothyroidism -check TSH level, continue medications the same, reminded to take on an empty stomach 30-37mins before food.   Cough Stop ACE, get CXR, declines inhaler at this time  Continue diet and meds as discussed. Further disposition pending results of labs. Discussed med's effects and SE's.    HPI 75 y.o. male  presents for 3 month follow up with hypertension, hyperlipidemia, diabetes and vitamin D deficiency.   His blood pressure has been controlled at home, today their BP is BP: 140/60 mmHg.He does not workout. He denies chest pain, shortness of breath, dizziness.  He does walk usually 1.5-2 miles per day but not as much due to weather . Has allergies but has had dry cough, tickle in his throat, and some wheezing without exertion in last 2 months.   He does have right knee pain and bilateral hip pain with walking.    He is on cholesterol medication and denies myalgias. His cholesterol is at goal. The cholesterol was:  06/01/2015: Cholesterol 130; HDL 47; LDL Cholesterol 69; Triglycerides 71   He has been working on diet and exercise for prediabetes without complications, he is on bASA, he is on ACE/ARB, and denies  foot ulcerations, hyperglycemia, hypoglycemia , increased appetite, nausea, paresthesia of the feet, polydipsia, polyuria, visual disturbances, vomiting and weight loss. Last A1C was: 06/01/2015: Hgb A1c MFr Bld 5.6   Patient is on Vitamin D supplement. 06/01/2015: Vit D,  25-Hydroxy 78  He is on thyroid medication. His medication was not changed last visit.   Lab Results  Component Value Date   TSH 1.666 06/01/2015  .  Non tender ulcer on penis x 2-3 weeks, will increase and decrease in size.not sexually active.  Will occ get sharp pain through one of his testicles, still some left inguinal discomfort, no burning with urination, no discharge.   Current Medications:  Current Outpatient Prescriptions on File Prior to Visit  Medication Sig Dispense Refill  . alfuzosin (UROXATRAL) 10 MG 24 hr tablet Take 10 mg by mouth every evening.    [name] aspirin 81 MG chewable tablet Chew 81 mg by mouth daily.    . Cholecalciferol (VITAMIN D PO) Take 7,000 Int'l Units by mouth daily.    . fluticasone (FLONASE) 50 MCG/ACT nasal spray USE TWO SPRAYS IN EACH NOSTRIL EVERY DAY 16 g 3  . levothyroxine (SYNTHROID, LEVOTHROID) 50 MCG tablet TAKE ONE TABLET BY MOUTH ONCE DAILY. 90 tablet 0  . lisinopril (PRINIVIL,ZESTRIL) 20 MG tablet TAKE ONE TABLET BY MOUTH ONCE DAILY 90 tablet 1  . simvastatin (ZOCOR) 20 MG tablet TAKE ONE TABLET BY MOUTH ONCE DAILY 90 tablet 1   No current facility-administered medications on file prior to visit.   Medical History:  Past Medical History  Diagnosis Date  . Hypertension   . Hyperlipidemia   . GERD (gastroesophageal reflux disease)   . Prediabetes   . Asthma   . IBS (irritable bowel syndrome)   . Thyroid disease   . RLS (restless legs syndrome)   .  Atherosclerosis of aorta ([place])     via CXR  . OSA (obstructive sleep apnea)     has a cpap-canot use it  . COPD (chronic obstructive pulmonary disease) ([place])   . Wears glasses   . Wears partial dentures     top  . Seasonal allergies   . Arthritis    Allergies:  Allergies  Allergen Reactions  . Penicillins Anaphylaxis and Swelling  . Sulfa Antibiotics Anaphylaxis and Swelling     Review of Systems:  Review of Systems  Constitutional: Negative for fever, chills and malaise/fatigue.   HENT: Positive for congestion. Negative for ear pain and sore throat.   Respiratory: Positive for cough. Negative for shortness of breath and wheezing.   Cardiovascular: Negative for chest pain, palpitations and leg swelling.  Gastrointestinal: Negative for abdominal pain, diarrhea, constipation, blood in stool and melena.  Genitourinary: Negative for dysuria, urgency, frequency and hematuria.  Skin: Negative.   Neurological: Negative for dizziness, sensory change, loss of consciousness and headaches.  Psychiatric/Behavioral: Negative for depression. The patient is not nervous/anxious and does not have insomnia.     Family history- Review and unchanged  Social history- Review and unchanged  Physical Exam: BP 140/60 mmHg  Pulse 83  Temp(Src) 97.9 F (36.6 C) (Temporal)  Resp 16  Ht 5\' 6"  (1.676 m)  Wt 184 lb 12.8 oz (83.825 kg)  BMI 29.84 kg/m2  SpO2 96% Wt Readings from Last 3 Encounters:  09/04/15 184 lb 12.8 oz (83.825 kg)  06/01/15 181 lb 3.2 oz (82.192 kg)  04/20/15 182 lb 12.8 oz (82.918 kg)   General Appearance: Well nourished well developed, non-toxic appearing, in no apparent distress. Eyes: PERRLA, EOMs, conjunctiva no swelling or erythema ENT/Mouth: Ear canals clear with no erythema, swelling, or discharge.  TMs normal bilaterally, oropharynx clear, moist, with no exudate.   Neck: Supple, thyroid normal, no JVD, no cervical adenopathy.  Respiratory: Respiratory effort normal, breath sounds clear A&P, no wheeze, rhonchi or rales noted.  No retractions, no accessory muscle usage Cardio: RRR with no RGs. 3/6 murmur radiating abdomen and carotids No noted edema.  Abdomen: Soft, + BS.  Non tender, no guarding, rebound, hernias, masses. GU:  Penis and testicles normal with visual inspection.   There is no tenderness to palpation of the left testicle.  Has small black head at base of penis, no erythema, swelling, tenderness Musculoskeletal: Full ROM, 5/5 strength, Normal  gait Skin: Warm, dry without rashes, lesions, ecchymosis.  Neuro: Awake and oriented X 3, Cranial nerves intact. No cerebellar symptoms.  Psych: normal affect, Insight and Judgment appropriate.    Vicie Mutters, PA-C 11:10 AM [facility]

## 2015-09-04 NOTE — Patient Instructions (Addendum)
Stop the lisinopril and start the losartan 100, start on 1/2 and monitor BP, increase if BP is greater than 150/90  - Try the Flonase or Nasonex. Remember to spray each nostril twice towards the outer part of your eye.  Do not sniff but instead pinch your nose and tilt your head back to help the medicine get into your sinuses.  The best time to do this is at bedtime.Stop if you get blurred vision or nose bleeds.  -While drinking fluids, pinch and hold nose close and swallow, to help open eustachian tubes to drain fluid behind ear drums.  -Please pick one of the over the counter allergy medications below and take it once daily for allergies, this helps with runny nose, clearing your throat.  Claritin or loratadine cheapest but likely the weakest  Zyrtec or certizine at night because it can make you sleepy The strongest is allegra or fexafinadine  Cheapest at [employer], [employer], [employer] -can use decongestant over the counter, please do not use if you have high blood pressure or certain heart conditions.   if worsening HA, changes vision/speech, imbalance, weakness go to the ER  Common causes of cough OR hoarseness OR sore throat:   Allergies, Viral Infections, Acid Reflux and Bacterial Infections.  1) Allergies and viral infections cause a cough OR sore throat by post nasal drip and are often worse at night, can also have sneezing, lower grade fevers, clear/yellow mucus. This is best treated with allergy medications or nasal sprays.  Please get on allegra for 1-2 weeks The strongest is allegra or fexafinadine  Cheapest at [employer], [employer], [employer]  2) Bacterial infections are more severe than allergies or viral infections with fever, teeth pain, fatigue. This can be treated with prednisone and the same over the counter medication and after 7 days can be treated with an antibiotic.   3) Silent reflux/GERD can cause a cough OR sore throat OR hoarseness WITHOUT heart burn because the esophagus that goes  to the stomach and trachea that goes to the lungs are very close and when you lay down the acid can irritate your throat and lungs. This can cause hoarseness, cough, and wheezing. Please stop any alcohol or anti-inflammatories like aleve/advil/ibuprofen and start an over the counter Prilosec or omeprazole 1-2 times daily 7mins before food for 2 weeks, then switch to over the counter zantac/ratinidine or pepcid/famotadine once at night for 2 weeks.   4) sometimes irritation causes more irritation. Try voice rest, use sugar free cough drops to prevent coughing, and try to stop clearing your throat.   If you ever have a cough that does not go away after trying these things please make a follow up visit for further evaluation or we can refer you to a specialist. Or if you ever have shortness of breath or chest pain go to the ER.

## 2015-09-05 ENCOUNTER — Ambulatory Visit (HOSPITAL_COMMUNITY)
Admission: RE | Admit: 2015-09-05 | Discharge: 2015-09-05 | Disposition: A | Discharge status: Home / Self Care | Payer: Medicare Other | Source: Ambulatory Visit | Attending: Physician Assistant | Admitting: Physician Assistant

## 2015-09-05 DIAGNOSIS — R05 Cough: Secondary | ICD-10-CM | POA: Diagnosis not present

## 2015-09-05 DIAGNOSIS — J8410 Pulmonary fibrosis, unspecified: Secondary | ICD-10-CM | POA: Diagnosis not present

## 2015-09-05 DIAGNOSIS — R059 Cough, unspecified: Secondary | ICD-10-CM

## 2015-09-05 LAB — HEMOGLOBIN A1C
Hgb A1c MFr Bld: 5.7 % — ABNORMAL HIGH (ref <5.7)
MEAN PLASMA GLUCOSE: 117 mg/dL — AB (ref <117)

## 2015-09-10 ENCOUNTER — Encounter: Payer: Self-pay | Admitting: *Deleted

## 2015-10-16 ENCOUNTER — Ambulatory Visit (INDEPENDENT_AMBULATORY_CARE_PROVIDER_SITE_OTHER): Payer: Medicare Other | Admitting: Internal Medicine

## 2015-10-16 ENCOUNTER — Encounter: Payer: Self-pay | Admitting: Internal Medicine

## 2015-10-16 VITALS — BP 152/64 | HR 78 | Temp 98.2°F | Resp 16 | Ht 66.0 in | Wt 183.0 lb

## 2015-10-16 DIAGNOSIS — K921 Melena: Secondary | ICD-10-CM | POA: Diagnosis not present

## 2015-10-16 MED ORDER — OMEPRAZOLE 40 MG PO CPDR: 40.0000 mg | DELAYED_RELEASE_CAPSULE | Freq: Two times a day (BID) | ORAL | Status: DC

## 2015-10-16 NOTE — Progress Notes (Signed)
   Subjective:    Patient ID: Robert Schaefer, male    DOB: 26-Aug-1940, 75 y.o.   MRN: [id number]  HPI  Patient presents to the office for evaluation of black stools x 2-3 weeks.  He reports that this has been unchanged.  He has had a lot of constipation.  He reports that he has had a lot of acid reflux lately and sometimes has some RUQ pain which radiates to his testicle.  He does take asa daily. He does not like to take any medications period. He has never been diagnosed with stomach ulcer.  He is not currently taking iron, pepto bismol or multivitamins.  He does take chewable aspirin.  He usually uses the enteric coated aspirin.  He has a strong family history of PUD.  Acid reflux has been gradually worsening for the last month.    Review of Systems  Constitutional: Negative for fever, chills and fatigue.  Respiratory: Negative for chest tightness and shortness of breath.   Cardiovascular: Negative for chest pain and palpitations.  Gastrointestinal: Positive for abdominal pain and constipation. Negative for nausea, vomiting, diarrhea, blood in stool, abdominal distention and anal bleeding.  Genitourinary: Negative for dysuria, urgency, frequency, hematuria and difficulty urinating.  Neurological: Positive for dizziness (chronic) and light-headedness. Negative for weakness.       Objective:   Physical Exam  Constitutional: He is oriented to person, place, and time. He appears well-developed and well-nourished. No distress.  HENT:  Head: Normocephalic.  Mouth/Throat: Oropharynx is clear and moist. No oropharyngeal exudate.  Eyes: Conjunctivae are normal. No scleral icterus.  Neck: Normal range of motion. Neck supple. No JVD present. No thyromegaly present.  Cardiovascular: Normal rate, regular rhythm, normal heart sounds and intact distal pulses.  Exam reveals no gallop and no friction rub.   No murmur heard. Pulmonary/Chest: Effort normal and breath sounds normal. No respiratory distress.  He has no wheezes. He has no rales. He exhibits no tenderness.  Abdominal: Soft. Bowel sounds are normal. He exhibits no distension and no mass. There is tenderness. There is no rebound and no guarding.  Genitourinary: Guaiac positive stool.  Dark brown stool on glove.  Small external hemorrhoid non-thrombosed, non-tender to palpation.    Musculoskeletal: Normal range of motion.  Lymphadenopathy:    He has no cervical adenopathy.  Neurological: He is alert and oriented to person, place, and time.  Skin: Skin is warm and dry. He is not diaphoretic.  Psychiatric: He has a normal mood and affect. His behavior is normal. Judgment and thought content normal.  Nursing note and vitals reviewed.   Filed Vitals:   10/16/15 1610  BP: 152/64  Pulse: 78  Temp: 98.2 F (36.8 C)  Resp: 16        Assessment & Plan:    1. Melena -d/c aspirin daily -avoid trigger foods, new list given -recommended miralax prn for constipation - omeprazole (PRILOSEC) 40 MG capsule; Take 1 capsule (40 mg total) by mouth 2 (two) times daily.  Dispense: 60 capsule; Refill: 1 - Ambulatory referral to Gastroenterology

## 2015-10-16 NOTE — Patient Instructions (Signed)
Food Choices for Gastroesophageal Reflux Disease, Adult When you have gastroesophageal reflux disease (GERD), the foods you eat and your eating habits are very important. Choosing the right foods can help ease the discomfort of GERD. WHAT GENERAL GUIDELINES DO I NEED TO FOLLOW?  Choose fruits, vegetables, whole grains, low-fat dairy products, and low-fat meat, fish, and poultry.  Limit fats such as oils, salad dressings, butter, nuts, and avocado.  Keep a food diary to identify foods that cause symptoms.  Avoid foods that cause reflux. These may be different for different people.  Eat frequent small meals instead of three large meals each day.  Eat your meals slowly, in a relaxed setting.  Limit fried foods.  Cook foods using methods other than frying.  Avoid drinking alcohol.  Avoid drinking large amounts of liquids with your meals.  Avoid bending over or lying down until 2-3 hours after eating. WHAT FOODS ARE NOT RECOMMENDED? The following are some foods and drinks that may worsen your symptoms: Vegetables Tomatoes. Tomato juice. Tomato and spaghetti sauce. Chili peppers. Onion and garlic. Horseradish. Fruits Oranges, grapefruit, and lemon (fruit and juice). Meats High-fat meats, fish, and poultry. This includes hot dogs, ribs, ham, sausage, salami, and bacon. Dairy Whole milk and chocolate milk. Sour cream. Cream. Butter. Ice cream. Cream cheese.  Beverages Coffee and tea, with or without caffeine. Carbonated beverages or energy drinks. Condiments Hot sauce. Barbecue sauce.  Sweets/Desserts Chocolate and cocoa. Donuts. Peppermint and spearmint. Fats and Oils High-fat foods, including French fries and potato chips. Other Vinegar. Strong spices, such as black pepper, white pepper, red pepper, cayenne, curry powder, cloves, ginger, and chili powder. The items listed above may not be a complete list of foods and beverages to avoid. Contact your dietitian for more  information.   This information is not intended to replace advice given to you by your health care provider. Make sure you discuss any questions you have with your health care provider.   Document Released: 06/16/2005 Document Revised: 07/07/2014 Document Reviewed: 04/20/2013 Elsevier Interactive Patient Education 2016 [employer]  

## 2015-10-18 ENCOUNTER — Ambulatory Visit: Payer: Medicare Other | Admitting: Internal Medicine

## 2015-10-18 ENCOUNTER — Emergency Department (HOSPITAL_COMMUNITY): Payer: Medicare Other

## 2015-10-18 ENCOUNTER — Encounter: Payer: Self-pay | Admitting: Internal Medicine

## 2015-10-18 ENCOUNTER — Encounter (HOSPITAL_COMMUNITY): Payer: Self-pay | Admitting: Emergency Medicine

## 2015-10-18 ENCOUNTER — Emergency Department (HOSPITAL_COMMUNITY)
Admission: EM | Admit: 2015-10-18 | Discharge: 2015-10-18 | Disposition: A | Discharge status: Home / Self Care | Payer: Medicare Other | Attending: Emergency Medicine | Admitting: Emergency Medicine

## 2015-10-18 VITALS — BP 142/60 | HR 78 | Temp 98.2°F | Resp 16 | Ht 66.0 in

## 2015-10-18 DIAGNOSIS — Z8669 Personal history of other diseases of the nervous system and sense organs: Secondary | ICD-10-CM | POA: Insufficient documentation

## 2015-10-18 DIAGNOSIS — Z8739 Personal history of other diseases of the musculoskeletal system and connective tissue: Secondary | ICD-10-CM | POA: Insufficient documentation

## 2015-10-18 DIAGNOSIS — Z880 Allergy status to penicillin: Secondary | ICD-10-CM | POA: Diagnosis not present

## 2015-10-18 DIAGNOSIS — K219 Gastro-esophageal reflux disease without esophagitis: Secondary | ICD-10-CM | POA: Insufficient documentation

## 2015-10-18 DIAGNOSIS — R011 Cardiac murmur, unspecified: Secondary | ICD-10-CM | POA: Insufficient documentation

## 2015-10-18 DIAGNOSIS — R140 Abdominal distension (gaseous): Secondary | ICD-10-CM | POA: Diagnosis not present

## 2015-10-18 DIAGNOSIS — Z79899 Other long term (current) drug therapy: Secondary | ICD-10-CM | POA: Insufficient documentation

## 2015-10-18 DIAGNOSIS — E079 Disorder of thyroid, unspecified: Secondary | ICD-10-CM | POA: Diagnosis not present

## 2015-10-18 DIAGNOSIS — E785 Hyperlipidemia, unspecified: Secondary | ICD-10-CM | POA: Insufficient documentation

## 2015-10-18 DIAGNOSIS — Z87891 Personal history of nicotine dependence: Secondary | ICD-10-CM | POA: Diagnosis not present

## 2015-10-18 DIAGNOSIS — J449 Chronic obstructive pulmonary disease, unspecified: Secondary | ICD-10-CM | POA: Insufficient documentation

## 2015-10-18 DIAGNOSIS — K920 Hematemesis: Secondary | ICD-10-CM

## 2015-10-18 DIAGNOSIS — R1013 Epigastric pain: Secondary | ICD-10-CM

## 2015-10-18 DIAGNOSIS — R042 Hemoptysis: Secondary | ICD-10-CM

## 2015-10-18 DIAGNOSIS — I10 Essential (primary) hypertension: Secondary | ICD-10-CM | POA: Diagnosis not present

## 2015-10-18 DIAGNOSIS — Z7951 Long term (current) use of inhaled steroids: Secondary | ICD-10-CM | POA: Diagnosis not present

## 2015-10-18 DIAGNOSIS — K921 Melena: Secondary | ICD-10-CM

## 2015-10-18 LAB — CBC
HEMATOCRIT: 42.2 % (ref 39.0–52.0)
HEMOGLOBIN: 13.5 g/dL (ref 13.0–17.0)
MCH: 30.2 pg (ref 26.0–34.0)
MCHC: 32 g/dL (ref 30.0–36.0)
MCV: 94.4 fL (ref 78.0–100.0)
Platelets: DECREASED 10*3/uL (ref 150–400)
RBC: 4.47 MIL/uL (ref 4.22–5.81)
RDW: 14.4 % (ref 11.5–15.5)
WBC: 5.6 10*3/uL (ref 4.0–10.5)

## 2015-10-18 LAB — POC OCCULT BLOOD, ED: FECAL OCCULT BLD: NEGATIVE

## 2015-10-18 LAB — COMPREHENSIVE METABOLIC PANEL
ALBUMIN: 3.8 g/dL (ref 3.5–5.0)
ALK PHOS: 51 U/L (ref 38–126)
ALT: 21 U/L (ref 17–63)
ANION GAP: 10 (ref 5–15)
AST: 23 U/L (ref 15–41)
BILIRUBIN TOTAL: 0.9 mg/dL (ref 0.3–1.2)
BUN: 12 mg/dL (ref 6–20)
CALCIUM: 9.7 mg/dL (ref 8.9–10.3)
CO2: 22 mmol/L (ref 22–32)
Chloride: 109 mmol/L (ref 101–111)
Creatinine, Ser: 1.09 mg/dL (ref 0.61–1.24)
GFR calc non Af Amer: >60 mL/min (ref >60)
Glucose, Bld: 138 mg/dL — ABNORMAL HIGH (ref 65–99)
POTASSIUM: 4.3 mmol/L (ref 3.5–5.1)
Sodium: 141 mmol/L (ref 135–145)
TOTAL PROTEIN: 6.8 g/dL (ref 6.5–8.1)

## 2015-10-18 MED ORDER — GI COCKTAIL ~~LOC~~
30.0000 mL | Freq: Once | ORAL | Status: AC
  Administered 2015-10-18: 30 mL via ORAL
  Filled 2015-10-18: qty 30

## 2015-10-18 NOTE — ED Notes (Signed)
Patient states has been "spitting up blood since this morning.   I woke up this morning with a mouthful of blood".   Patient states went to his doctor this morning and they sent him here.   Patient states take ASA every day, but denies other blood thinners.   Patient denies any other symptoms.

## 2015-10-18 NOTE — ED Notes (Signed)
Pt is in stable condition upon d/c and is escorted from ED via wheelchair. 

## 2015-10-18 NOTE — ED Notes (Addendum)
No distress noted at nurse first.  Alert and ambulatory.

## 2015-10-18 NOTE — ED Provider Notes (Signed)
CSN: [id number]     Arrival date & time 10/18/15  45 History   First MD Initiated Contact with Patient 10/18/15 1520     Chief Complaint  Patient presents with  . Hematemesis     (Consider location/radiation/quality/duration/timing/severity/associated sxs/prior Treatment) The history is provided by the patient.     Robert Schaefer is a 75 year old male with history of GERD, hypertension, HLD, COPD, seasonal allergies, IBS, presents to the ER with complaint of a "mouth full of blood" that he spit out this morning without further bleeding, few streaks of blood in spit shortly after, however he denies nose bleed, vomiting and cough.  He states he is concerned about "perforation."  He has mild epigastric abdominal pain, dysphagia, and constipation, all of which are chronic complaints, without acute change.  He denies melena, hematochezia, hematemesis, N, V, and change in BM's.  He denies cough, CP, SOB, hemoptysis.  No near-syncope, weakness, or fatigue.  No other complaints.  Past Medical History  Diagnosis Date  . Hypertension   . Hyperlipidemia   . GERD (gastroesophageal reflux disease)   . Prediabetes   . Asthma   . IBS (irritable bowel syndrome)   . Thyroid disease   . RLS (restless legs syndrome)   . Atherosclerosis of aorta ([place])     via CXR  . OSA (obstructive sleep apnea)     has a cpap-canot use it  . COPD (chronic obstructive pulmonary disease) ([place])   . Wears glasses   . Wears partial dentures     top  . Seasonal allergies   . Arthritis    Past Surgical History  Procedure Laterality Date  . Cataract extraction Bilateral   . Colonoscopy    . Upper gi endoscopy    . Pilonidal cyst / sinus excision  2000    spine  . Insertion of mesh Right 02/23/2014    Procedure: INSERTION OF MESH;  Surgeon: Joyice Faster. Cornett, MD;  Location: [facility];  Service: General;  Laterality: Right;  . Inguinal hernia repair Right 02/23/2014    Procedure: HERNIA REPAIR INGUINAL  ADULT;  Surgeon: Joyice Faster. Cornett, MD;  Location: [facility];  Service: General;  Laterality: Right;   Family History  Problem Relation Age of Onset  . Heart attack Mother   . Stroke Father   . Asthma Father    Social History  Substance Use Topics  . Smoking status: Former Smoker    Quit date: 07/05/2005  . Smokeless tobacco: Never Used  . Alcohol Use: No    Review of Systems  All other systems reviewed and are negative.     Allergies  Penicillins and Sulfa antibiotics  Home Medications   Prior to Admission medications   Medication Sig Start Date End Date Taking? Authorizing Provider  alfuzosin (UROXATRAL) 10 MG 24 hr tablet Take 10 mg by mouth every evening.   Yes Historical Provider, MD  Cholecalciferol (VITAMIN D PO) Take 7,000 Int'l Units by mouth daily.   Yes Historical Provider, MD  fluticasone (FLONASE) 50 MCG/ACT nasal spray USE TWO SPRAYS IN EACH NOSTRIL EVERY DAY 08/10/13  Yes Melissa Smith, PA-C  levothyroxine (SYNTHROID, LEVOTHROID) 50 MCG tablet TAKE ONE TABLET BY MOUTH ONCE DAILY. 08/29/15  Yes [name], MD  losartan (COZAAR) 100 MG tablet 1/2-1 pill daily for blood pressure 09/04/15 09/04/18 Yes Vicie Mutters, PA-C  omeprazole (PRILOSEC) 40 MG capsule Take 1 capsule (40 mg total) by mouth 2 (two) times daily. 10/16/15 10/15/16 Yes  Courtney Forcucci, PA-C  simvastatin (ZOCOR) 20 MG tablet Take 10 mg by mouth every evening.   Yes Historical Provider, MD  simvastatin (ZOCOR) 20 MG tablet TAKE ONE TABLET BY MOUTH ONCE DAILY Patient not taking: Reported on 10/18/2015 06/28/15   [name], MD   BP 144/44 mmHg  Pulse 62  Temp(Src) 98.4 F (36.9 C) (Oral)  Resp 20  Ht 5\' 6"  (1.676 m)  Wt 83.099 kg  BMI 29.58 kg/m2  SpO2 97% Physical Exam  Constitutional: He is oriented to person, place, and time. He appears well-developed and well-nourished. No distress.  HENT:  Head: Normocephalic and atraumatic.  Nose: Nose normal.  Mouth/Throat:  Oropharynx is clear and moist. No oropharyngeal exudate.  No palpebra conjunctiva pallor Nasal mucosa mildly erythematous with nasal discharge Posterior oropharynx mildly erythematous without exudate, uvula midline No presence of blood in posterior oropharynx or observed and nose, no epistaxis  Eyes: Conjunctivae and EOM are normal. Pupils are equal, round, and reactive to light. Right eye exhibits no discharge. Left eye exhibits no discharge. No scleral icterus.  Neck: Normal range of motion. No JVD present. No tracheal deviation present. No thyromegaly present.  Cardiovascular: Normal rate, regular rhythm and intact distal pulses.  Exam reveals no gallop and no friction rub.   Murmur heard. Pulmonary/Chest: Effort normal and breath sounds normal. No respiratory distress. He has no wheezes. He has no rales. He exhibits no tenderness.  Abdominal: Soft. Bowel sounds are normal. He exhibits no distension and no mass. There is tenderness. There is no rebound and no guarding.  Mild epigastric tenderness without guarding or rebound, normal bowel sounds 4  Genitourinary: Rectum normal.  Rectum normal appearance, no external hemorrhoids, no internal hemorrhoids palpated, normal rectal tone, presence of firm stool in rectal vault, Hemoccult negative  Musculoskeletal: Normal range of motion. He exhibits no edema or tenderness.  Lymphadenopathy:    He has no cervical adenopathy.  Neurological: He is alert and oriented to person, place, and time. He has normal reflexes. No cranial nerve deficit. He exhibits normal muscle tone. Coordination normal.  Skin: Skin is warm and dry. No rash noted. He is not diaphoretic. No erythema. No pallor.  Psychiatric: He has a normal mood and affect. His behavior is normal. Judgment and thought content normal.  Nursing note and vitals reviewed.   ED Course  Procedures (including critical care time) Labs Review Labs Reviewed  COMPREHENSIVE METABOLIC PANEL - Abnormal;  Notable for the following:    Glucose, Bld 138 (*)    All other components within normal limits  CBC  POC OCCULT BLOOD, ED    Imaging Review Dg Abd Acute W/chest  10/18/2015  CLINICAL DATA:  Hematemesis.  Bloating.  Epigastric soreness.  COPD. EXAM: DG ABDOMEN ACUTE W/ 1V CHEST COMPARISON:  09/05/2015 FINDINGS: Atherosclerotic aortic arch. Heart size within normal limits. Chronic calcified nodule of the right lung apex likely a granuloma. Symmetric nipple shadows project over the lung bases. No free intraperitoneal gas beneath the hemidiaphragms. Prominent stool throughout the colon favors constipation. No dilated small bowel or abnormal air-fluid levels. Lower lumbar spondylosis.  Vascular calcifications. IMPRESSION: 1.  Prominent stool throughout the colon favors constipation. 2. Vascular calcifications. 3. Old granulomatous disease. Electronically Signed   By: Van Clines M.D.   On: 10/18/2015 16:06   I have personally reviewed and evaluated these images and lab results as part of my medical decision-making.   EKG Interpretation None      MDM   74  y/o male with complaints of "a mouth full of blood this morning" he had a few episodes of blood in spit since waking up, however he denies epistaxis, vomiting with hematemesis, and cough with hemoptysis.  He has hx of dysphagia and mild persistent epigastric pain, no acute change today.  He was recently started on PPI by his PCP two days ago.  He reports hx of constipation and hemorroids,  reports stool recently appeared darker, however denies melena/hematochezia.    Basic labs, plain films or chest and abdomen, and hemoccult ordered. H/H WNL, no decrease from recent, hemoccult negative, Xray negative for free air, positive for prominent stool in colon, suggestive of constipation.  He had no signs of epistaxis, no blood in posterior OP, no emesis or BM's while in ER.  Feel GI bleed is highly unlikely.  Source of blood from this morning is  unknown, however it appears to have resolved, and pt is hemodynamically stable.  No emergent medical condition present, and no need for admission or emergent consults or interventions.  Feel pt is safe to D/C home.  Case was discussed with Dr. Tomi Bamberger, who personally evaluated the pt and agrees pt is stable to d/c home.  Epigastric pain is being treated by his PCP, he will follow up with PCP and GI as needed.    Discussed with the pt and family members at the bedside, return precautions. PT d/c in good condition, VSS. Filed Vitals:   10/18/15 1246 10/18/15 1651  BP: 146/59 144/44  Pulse: 97 62  Temp: 98.4 F (36.9 C)   TempSrc: Oral   Resp: 18 20  Height: 5\' 6"  (1.676 m)   Weight: 83.099 kg   SpO2: 98% 97%     Final diagnoses:  Spitting up blood        Delsa Grana, PA-C 10/21/15 1827  Dorie Rank, MD 10/25/15 0800

## 2015-10-18 NOTE — Progress Notes (Signed)
Subjective:    Patient ID: Robert Schaefer, male    DOB: 1941/02/18, 75 y.o.   MRN: [id number]  HPI  Patient presents to the office for evaluation of a mouth full of blood.  He reports that he burped about an hour ago and he got some blood up.  He has not seen any blood since approximately an hour ago.  He did blow his nose this morning and did not get any blood up out of his nose.  He reports that he did cough once and did get some bloody tinged mucous.  He reports that the blood was more than a table spoon of blood.  He reports that he got a significant amount of blood the several times that he burped as well.  He did start taking omeprazole several days ago.  He has never had hemoptysis or hematemesis in the past.  He is due to speak to Dr. Amedeo Plenty office.  He reports that he has been feeling very weak.  He does have a history of smoking.  He did quit 10 years ago.  At his heaviest he smoked 2 packs per day.  He has not vomited today.  He has had a ham sandwich.  He has stopped asa on 10/16/15, but prior to that had been using chewable asa for the last month.  He does admit to some difficulty with swallowing without significant pain.[name]  He is taking omperazole twice daily for the past two days.  He reports that he does still have epigastric pain but it has not changed much since visit on 10/16/15.      Review of Systems  Constitutional: Negative for fever, chills and fatigue.  HENT: Negative for congestion, ear pain, nosebleeds and rhinorrhea.   Respiratory: Negative for cough, chest tightness and shortness of breath.   Cardiovascular: Negative for chest pain.  Gastrointestinal: Positive for abdominal pain. Negative for nausea, vomiting, diarrhea, constipation and anal bleeding.       Melena, belching up blood  Genitourinary: Negative for urgency, frequency, hematuria, difficulty urinating and penile pain.  Neurological: Negative for dizziness, speech difficulty, weakness and light-headedness.   Psychiatric/Behavioral: Negative for suicidal ideas, confusion, sleep disturbance, dysphoric mood and agitation. The patient is not nervous/anxious.        Objective:   Physical Exam  Constitutional: He is oriented to person, place, and time. He appears well-developed and well-nourished. No distress.  HENT:  Head: Normocephalic.  Mouth/Throat: Oropharynx is clear and moist. No oropharyngeal exudate.  Eyes: Conjunctivae are normal. No scleral icterus.  Neck: Normal range of motion. Neck supple. No JVD present. No thyromegaly present.  Cardiovascular: Normal rate, regular rhythm and intact distal pulses.  Exam reveals no gallop and no friction rub.   Murmur heard. Pulmonary/Chest: Effort normal and breath sounds normal. No respiratory distress. He has no wheezes. He has no rales. He exhibits no tenderness.  Abdominal: Soft. Normal appearance. He exhibits no distension and no mass. There is tenderness in the epigastric area. There is no rigidity, no rebound, no guarding, no CVA tenderness, no tenderness at McBurney's point and negative Murphy's sign.  Musculoskeletal: Normal range of motion.  Lymphadenopathy:    He has no cervical adenopathy.  Neurological: He is alert and oriented to person, place, and time.  Skin: Skin is warm and dry. He is not diaphoretic.  Psychiatric: He has a normal mood and affect. His behavior is normal. Judgment and thought content normal.  Nursing note and vitals reviewed.  Filed Vitals:   10/18/15 1129  BP: 142/60  Pulse: 78  Temp: 98.2 F (36.8 C)  Resp: 16         Assessment & Plan:   Patient presents to the office for belching of bright red blood multiple times today with recent melanotic stools and epigastric pain.  We did call [facility] as he is a patient of Dr. Amedeo Plenty for emergent appointment which was not available today as they are overbooked and Dr. Amedeo Plenty is out of town.  Ddx includes Bleeding peptic ulcer, gastric cancer, mallory weiss tear,  bleeding esophageal varices.  Given red flag alarm symptoms of belching bright red blood, epigastric pain and melena will send to ER for further evaluation and possible need for urgent endoscopy.  I spoke with Dr. Melford Aase who agreed that patient should be sent to ER for further evaluation.  ER staff to evaluate and treat.

## 2015-10-18 NOTE — Discharge Instructions (Signed)
Please continue to take Prilosec as prescribed.    Gastroesophageal Reflux Disease, Adult Normally, food travels down the esophagus and stays in the stomach to be digested. However, when a person has gastroesophageal reflux disease (GERD), food and stomach acid move back up into the esophagus. When this happens, the esophagus becomes sore and inflamed. Over time, GERD can create small holes (ulcers) in the lining of the esophagus.  CAUSES This condition is caused by a problem with the muscle between the esophagus and the stomach (lower esophageal sphincter, or LES). Normally, the LES muscle closes after food passes through the esophagus to the stomach. When the LES is weakened or abnormal, it does not close properly, and that allows food and stomach acid to go back up into the esophagus. The LES can be weakened by certain dietary substances, medicines, and medical conditions, including:  Tobacco use.  Pregnancy.  Having a hiatal hernia.  Heavy alcohol use.  Certain foods and beverages, such as coffee, chocolate, onions, and peppermint. RISK FACTORS This condition is more likely to develop in:  People who have an increased body weight.  People who have connective tissue disorders.  People who use NSAID medicines. SYMPTOMS Symptoms of this condition include:  Heartburn.  Difficult or painful swallowing.  The feeling of having a lump in the throat.  Abitter taste in the mouth.  Bad breath.  Having a large amount of saliva.  Having an upset or bloated stomach.  Belching.  Chest pain.  Shortness of breath or wheezing.  Ongoing (chronic) cough or a night-time cough.  Wearing away of tooth enamel.  Weight loss. Different conditions can cause chest pain. Make sure to see your health care provider if you experience chest pain. DIAGNOSIS Your health care provider will take a medical history and perform a physical exam. To determine if you have mild or severe GERD, your  health care provider may also monitor how you respond to treatment. You may also have other tests, including:  An endoscopy toexamine your stomach and esophagus with a small camera.  A test thatmeasures the acidity level in your esophagus.  A test thatmeasures how much pressure is on your esophagus.  A barium swallow or modified barium swallow to show the shape, size, and functioning of your esophagus. TREATMENT The goal of treatment is to help relieve your symptoms and to prevent complications. Treatment for this condition may vary depending on how severe your symptoms are. Your health care provider may recommend:  Changes to your diet.  Medicine.  Surgery. HOME CARE INSTRUCTIONS Diet  Follow a diet as recommended by your health care provider. This may involve avoiding foods and drinks such as:  Coffee and tea (with or without caffeine).  Drinks that containalcohol.  Energy drinks and sports drinks.  Carbonated drinks or sodas.  Chocolate and cocoa.  Peppermint and mint flavorings.  Garlic and onions.  Horseradish.  Spicy and acidic foods, including peppers, chili powder, curry powder, vinegar, hot sauces, and barbecue sauce.  Citrus fruit juices and citrus fruits, such as oranges, lemons, and limes.  Tomato-based foods, such as red sauce, chili, salsa, and pizza with red sauce.  Fried and fatty foods, such as donuts, french fries, potato chips, and high-fat dressings.  High-fat meats, such as hot dogs and fatty cuts of red and white meats, such as rib eye steak, sausage, ham, and bacon.  High-fat dairy items, such as whole milk, butter, and cream cheese.  Eat small, frequent meals instead of large  meals.  Avoid drinking large amounts of liquid with your meals.  Avoid eating meals during the 2-3 hours before bedtime.  Avoid lying down right after you eat.  Do not exercise right after you eat. General Instructions  Pay attention to any changes in  your symptoms.  Take over-the-counter and prescription medicines only as told by your health care provider. Do not take aspirin, ibuprofen, or other NSAIDs unless your health care provider told you to do so.  Do not use any tobacco products, including cigarettes, chewing tobacco, and e-cigarettes. If you need help quitting, ask your health care provider.  Wear loose-fitting clothing. Do not wear anything tight around your waist that causes pressure on your abdomen.  Raise (elevate) the head of your bed 6 inches (15cm).  Try to reduce your stress, such as with yoga or meditation. If you need help reducing stress, ask your health care provider.  If you are overweight, reduce your weight to an amount that is healthy for you. Ask your health care provider for guidance about a safe weight loss goal.  Keep all follow-up visits as told by your health care provider. This is important. SEEK MEDICAL CARE IF:  You have new symptoms.  You have unexplained weight loss.  You have difficulty swallowing, or it hurts to swallow.  You have wheezing or a persistent cough.  Your symptoms do not improve with treatment.  You have a hoarse voice. SEEK IMMEDIATE MEDICAL CARE IF:  You have pain in your arms, neck, jaw, teeth, or back.  You feel sweaty, dizzy, or light-headed.  You have chest pain or shortness of breath.  You vomit and your vomit looks like blood or coffee grounds.  You faint.  Your stool is bloody or black.  You cannot swallow, drink, or eat.   This information is not intended to replace advice given to you by your health care provider. Make sure you discuss any questions you have with your health care provider.   Document Released: 03/26/2005 Document Revised: 03/07/2015 Document Reviewed: 10/11/2014 Elsevier Interactive Patient Education 2016 Elsevier Inc[employer] Esophagitis Esophagitis is inflammation of the esophagus. The esophagus is the tube that carries food and liquids from  your mouth to your stomach. Esophagitis can cause soreness or pain in the esophagus. This condition can make it difficult and painful to swallow.  CAUSES Most causes of esophagitis are not serious. Common causes of this condition include:  Gastroesophageal reflux disease (GERD). This is when stomach contents move back up into the esophagus (reflux).  Repeated vomiting.  An allergic-type reaction, especially caused by food allergies (eosinophilic esophagitis).  Injury to the esophagus by swallowing large pills with or without water, or swallowing certain types of medicines.  Swallowing (ingesting) harmful chemicals, such as household cleaning products.  Heavy alcohol use.  An infection of the esophagus.This most often occurs in people who have a weakened immune system.  Radiation or chemotherapy treatment for cancer.  Certain diseases such as sarcoidosis, Crohn disease, and scleroderma. SYMPTOMS Symptoms of this condition include:  Difficult or painful swallowing.  Pain with swallowing acidic liquids, such as citrus juices.  Pain with burping.  Chest pain.  Difficulty breathing.  Nausea.  Vomiting.  Pain in the abdomen.  Weight loss.  Ulcers in the mouth.  Patches of white material in the mouth (candidiasis).  Fever.  Coughing up blood or vomiting blood.  Stool that is black, tarry, or bright red. DIAGNOSIS Your health care provider will take a medical history and  perform a physical exam. You may also have other tests, including:  An endoscopy to examine your stomach and esophagus with a small camera.  A test that measures the acidity level in your esophagus.  A test that measures how much pressure is on your esophagus.  A barium swallow or modified barium swallow to show the shape, size, and functioning of your esophagus.  Allergy tests. TREATMENT Treatment for this condition depends on the cause of your esophagitis. In some cases, steroids or other  medicines may be given to help relieve your symptoms or to treat the underlying cause of your condition. You may have to make some lifestyle changes, such as:  Avoiding alcohol.  Quitting smoking.  Changing your diet.  Exercising.  Changing your sleep habits and your sleep environment. HOME CARE INSTRUCTIONS Take these actions to decrease your discomfort and to help avoid complications. Diet  Follow a diet as recommended by your health care provider. This may involve avoiding foods and drinks such as:  Coffee and tea (with or without caffeine).  Drinks that contain alcohol.  Energy drinks and sports drinks.  Carbonated drinks or sodas.  Chocolate and cocoa.  Peppermint and mint flavorings.  Garlic and onions.  Horseradish.  Spicy and acidic foods, including peppers, chili powder, curry powder, vinegar, hot sauces, and barbecue sauce.  Citrus fruit juices and citrus fruits, such as oranges, lemons, and limes.  Tomato-based foods, such as red sauce, chili, salsa, and pizza with red sauce.  Fried and fatty foods, such as donuts, french fries, potato chips, and high-fat dressings.  High-fat meats, such as hot dogs and fatty cuts of red and white meats, such as rib eye steak, sausage, ham, and bacon.  High-fat dairy items, such as whole milk, butter, and cream cheese.  Eat small, frequent meals instead of large meals.  Avoid drinking large amounts of liquid with your meals.  Avoid eating meals during the 2-3 hours before bedtime.  Avoid lying down right after you eat.  Do not exercise right after you eat.  Avoid foods and drinks that seem to make your symptoms worse. General Instructions  Pay attention to any changes in your symptoms.  Take over-the-counter and prescription medicines only as told by your health care provider. Do not take aspirin, ibuprofen, or other NSAIDs unless your health care provider told you to do so.  If you have trouble taking pills,  use a pill splitter to decrease the size of the pill. This will decrease the chance of the pill getting stuck or injuring your esophagus on the way down. Also, drink water after you take a pill.  Do not use any tobacco products, including cigarettes, chewing tobacco, and e-cigarettes. If you need help quitting, ask your health care provider.  Wear loose-fitting clothing. Do not wear anything tight around your waist that causes pressure on your abdomen.  Raise (elevate) the head of your bed about 6 inches (15 cm).  Try to reduce your stress, such as with yoga or meditation. If you need help reducing stress, ask your health care provider.  If you are overweight, reduce your weight to an amount that is healthy for you. Ask your health care provider for guidance about a safe weight loss goal.  Keep all follow-up visits as told by your health care provider. This is important. SEEK MEDICAL CARE IF:  You have new symptoms.  You have unexplained weight loss.  You have difficulty swallowing, or it hurts to swallow.  You have  wheezing or a persistent cough.  Your symptoms do not improve with treatment.  You have frequent heartburn for more than two weeks. SEEK IMMEDIATE MEDICAL CARE IF:  You have severe pain in your arms, neck, jaw, teeth, or back.  You feel sweaty, dizzy, or light-headed.  You have chest pain or shortness of breath.  You vomit and your vomit looks like blood or coffee grounds.  Your stool is bloody or black.  You have a fever.  You cannot swallow, drink, or eat.   This information is not intended to replace advice given to you by your health care provider. Make sure you discuss any questions you have with your health care provider.   Document Released: 07/24/2004 Document Revised: 03/07/2015 Document Reviewed: 10/11/2014 Elsevier Interactive Patient Education 2016 Elsevier Inc.  Gastrointestinal Bleeding Gastrointestinal bleeding is bleeding somewhere along  the path that food travels through the body (digestive tract). This path is anywhere between the mouth and the opening of the butt (anus). You may have blood in your throw up (vomit) or in your poop (stools). If there is a lot of bleeding, you may need to stay in the hospital. [place]  Only take medicine as told by your doctor.  Eat foods with fiber such as whole grains, fruits, and vegetables. You can also try eating 1 to 3 prunes a day.  Drink enough fluids to keep your pee (urine) clear or pale yellow. GET HELP RIGHT AWAY IF:   Your bleeding gets worse.  You feel dizzy, weak, or you pass out (faint).  You have bad cramps in your back or belly (abdomen).  You have large blood clumps (clots) in your poop.  Your problems are getting worse. MAKE SURE YOU:   Understand these instructions.  Will watch your condition.  Will get help right away if you are not doing well or get worse.   This information is not intended to replace advice given to you by your health care provider. Make sure you discuss any questions you have with your health care provider.   Document Released: 03/25/2008 Document Revised: 06/02/2012 Document Reviewed: 12/04/2014 Elsevier Interactive Patient Education [employer].

## 2015-10-19 NOTE — ED Provider Notes (Signed)
Patient presented to the emergency room with complaints of spitting up blood this morning. He noticed it when he woke up that he had some blood in his mouth. He thinks he may have belched a few times and also noticed some blood. Patient denies any trouble with vomiting. He denies any trouble with coughing. He is not noticed any blood in his stool. Has not had any further episodes since. He does take aspirin daily. His primary care doctor and was sent to the ED. Physical Exam  BP 144/44 mmHg  Pulse 62  Temp(Src) 98.4 F (36.9 C) (Oral)  Resp 20  Ht 5\' 6"  (1.676 m)  Wt 83.099 kg  BMI 29.58 kg/m2  SpO2 97%  Physical Exam  Constitutional: He appears well-developed and well-nourished. No distress.  HENT:  Head: Normocephalic and atraumatic.  Right Ear: External ear normal.  Left Ear: External ear normal.  Mouth/Throat: No oropharyngeal exudate.  No blood noted in the nares or the mouth  Eyes: Conjunctivae are normal. Right eye exhibits no discharge. Left eye exhibits no discharge. No scleral icterus.  Neck: Neck supple. No tracheal deviation present.  Cardiovascular: Normal rate.   Pulmonary/Chest: Effort normal. No stridor. No respiratory distress.  Abdominal: He exhibits no distension. There is no tenderness. There is no rebound.  Musculoskeletal: He exhibits no edema.  Neurological: He is alert. Cranial nerve deficit: no gross deficits.  Skin: Skin is warm and dry. No rash noted.  Psychiatric: He has a normal mood and affect.  Nursing note and vitals reviewed.   ED Course  Procedures  MDM Patient's laboratory tests are normal. He is not anemic. There is no blood in his stool. He does not appear to be having any issues with an acute active GI bleed. Patient is stable to follow up with gastroenterology. Warning signs and precautions discussed      Dorie Rank, MD 10/19/15 [id number]

## 2015-10-31 DIAGNOSIS — K920 Hematemesis: Secondary | ICD-10-CM | POA: Diagnosis not present

## 2015-10-31 DIAGNOSIS — K921 Melena: Secondary | ICD-10-CM | POA: Diagnosis not present

## 2015-10-31 DIAGNOSIS — R1314 Dysphagia, pharyngoesophageal phase: Secondary | ICD-10-CM | POA: Diagnosis not present

## 2015-11-03 ENCOUNTER — Emergency Department (HOSPITAL_COMMUNITY): Payer: Medicare Other

## 2015-11-03 ENCOUNTER — Emergency Department (HOSPITAL_COMMUNITY)
Admission: EM | Admit: 2015-11-03 | Discharge: 2015-11-03 | Disposition: A | Discharge status: Home / Self Care | Payer: Medicare Other | Attending: Emergency Medicine | Admitting: Emergency Medicine

## 2015-11-03 ENCOUNTER — Encounter (HOSPITAL_COMMUNITY): Payer: Self-pay | Admitting: Emergency Medicine

## 2015-11-03 DIAGNOSIS — I10 Essential (primary) hypertension: Secondary | ICD-10-CM | POA: Diagnosis not present

## 2015-11-03 DIAGNOSIS — K219 Gastro-esophageal reflux disease without esophagitis: Secondary | ICD-10-CM | POA: Insufficient documentation

## 2015-11-03 DIAGNOSIS — G4733 Obstructive sleep apnea (adult) (pediatric): Secondary | ICD-10-CM | POA: Insufficient documentation

## 2015-11-03 DIAGNOSIS — Z8739 Personal history of other diseases of the musculoskeletal system and connective tissue: Secondary | ICD-10-CM | POA: Insufficient documentation

## 2015-11-03 DIAGNOSIS — Z7951 Long term (current) use of inhaled steroids: Secondary | ICD-10-CM | POA: Diagnosis not present

## 2015-11-03 DIAGNOSIS — Z8669 Personal history of other diseases of the nervous system and sense organs: Secondary | ICD-10-CM | POA: Diagnosis not present

## 2015-11-03 DIAGNOSIS — K920 Hematemesis: Secondary | ICD-10-CM | POA: Insufficient documentation

## 2015-11-03 DIAGNOSIS — J449 Chronic obstructive pulmonary disease, unspecified: Secondary | ICD-10-CM | POA: Insufficient documentation

## 2015-11-03 DIAGNOSIS — E079 Disorder of thyroid, unspecified: Secondary | ICD-10-CM | POA: Diagnosis not present

## 2015-11-03 DIAGNOSIS — Z79899 Other long term (current) drug therapy: Secondary | ICD-10-CM | POA: Diagnosis not present

## 2015-11-03 DIAGNOSIS — Z880 Allergy status to penicillin: Secondary | ICD-10-CM | POA: Insufficient documentation

## 2015-11-03 DIAGNOSIS — K922 Gastrointestinal hemorrhage, unspecified: Secondary | ICD-10-CM

## 2015-11-03 LAB — COMPREHENSIVE METABOLIC PANEL
ALK PHOS: 51 U/L (ref 38–126)
ALT: 17 U/L (ref 17–63)
ANION GAP: 9 (ref 5–15)
AST: 20 U/L (ref 15–41)
Albumin: 3.6 g/dL (ref 3.5–5.0)
BILIRUBIN TOTAL: 0.9 mg/dL (ref 0.3–1.2)
BUN: 12 mg/dL (ref 6–20)
CHLORIDE: 107 mmol/L (ref 101–111)
CO2: 26 mmol/L (ref 22–32)
Calcium: 10 mg/dL (ref 8.9–10.3)
Creatinine, Ser: 1 mg/dL (ref 0.61–1.24)
GFR calc Af Amer: >60 mL/min (ref >60)
GLUCOSE: 150 mg/dL — AB (ref 65–99)
POTASSIUM: 4.1 mmol/L (ref 3.5–5.1)
Sodium: 142 mmol/L (ref 135–145)
TOTAL PROTEIN: 6.5 g/dL (ref 6.5–8.1)

## 2015-11-03 LAB — TYPE AND SCREEN
ABO/RH(D): B POS
Antibody Screen: NEGATIVE

## 2015-11-03 LAB — CBC
HCT: 42.9 % (ref 39.0–52.0)
Hemoglobin: 14.1 g/dL (ref 13.0–17.0)
MCH: 31.1 pg (ref 26.0–34.0)
MCHC: 32.9 g/dL (ref 30.0–36.0)
MCV: 94.5 fL (ref 78.0–100.0)
PLATELETS: 130 10*3/uL — AB (ref 150–400)
RBC: 4.54 MIL/uL (ref 4.22–5.81)
RDW: 14.6 % (ref 11.5–15.5)
WBC: 5.5 10*3/uL (ref 4.0–10.5)

## 2015-11-03 LAB — I-STAT TROPONIN, ED: Troponin i, poc: 0.01 ng/mL (ref 0.00–0.08)

## 2015-11-03 LAB — POC OCCULT BLOOD, ED: Fecal Occult Bld: NEGATIVE

## 2015-11-03 LAB — PROTIME-INR
INR: 1.14 (ref 0.00–1.49)
PROTHROMBIN TIME: 14.8 s (ref 11.6–15.2)

## 2015-11-03 MED ORDER — PANTOPRAZOLE SODIUM 20 MG PO TBEC: 20.0000 mg | DELAYED_RELEASE_TABLET | Freq: Every day | ORAL | Status: DC

## 2015-11-03 MED ORDER — SODIUM CHLORIDE 0.9 % IV SOLN
80.0000 mg | Freq: Once | INTRAVENOUS | Status: AC
  Administered 2015-11-03: 80 mg via INTRAVENOUS
  Filled 2015-11-03: qty 80

## 2015-11-03 NOTE — ED Notes (Signed)
Patient states that he started coughing up black, tarry substance.  He states that he was here two weeks ago for the same.  Patient seems short of breath, but states that he is always that way.  Patient denies any chest pain at this time.  He states that he did have some abdominal pain earlier in the day.

## 2015-11-03 NOTE — ED Notes (Signed)
MD at bedside. 

## 2015-11-03 NOTE — Discharge Instructions (Signed)
Gastrointestinal Bleeding °Gastrointestinal bleeding is bleeding somewhere along the path that food travels through the body (digestive tract). This path is anywhere between the mouth and the opening of the butt (anus). You may have blood in your throw up (vomit) or in your poop (stools). If there is a lot of bleeding, you may need to stay in the hospital. °HOME CARE °· Only take medicine as told by your doctor. °· Eat foods with fiber such as whole grains, fruits, and vegetables. You can also try eating 1 to 3 prunes a day. °· Drink enough fluids to keep your pee (urine) clear or pale yellow. °GET HELP RIGHT AWAY IF:  °· Your bleeding gets worse. °· You feel dizzy, weak, or you pass out (faint). °· You have bad cramps in your back or belly (abdomen). °· You have large blood clumps (clots) in your poop. °· Your problems are getting worse. °MAKE SURE YOU:  °· Understand these instructions. °· Will watch your condition. °· Will get help right away if you are not doing well or get worse. °  °This information is not intended to replace advice given to you by your health care provider. Make sure you discuss any questions you have with your health care provider. °  °Document Released: 03/25/2008 Document Revised: 06/02/2012 Document Reviewed: 12/04/2014 °Elsevier Interactive Patient Education ©2016 Elsevier Inc. ° °

## 2015-11-03 NOTE — ED Notes (Signed)
RN attempt to start IV x 2; 2nd RN to start IV

## 2015-11-03 NOTE — ED Provider Notes (Signed)
CSN: [id number]     Arrival date & time 11/03/15  1957 History   First MD Initiated Contact with Patient 11/03/15 2019     Chief Complaint  Patient presents with  . Hematemesis  . Shortness of Breath     (Consider location/radiation/quality/duration/timing/severity/associated sxs/prior Treatment) HPI 75 y.o. Male complaining of coughing up some cofee ground substance.  He was here two weeks ago after coughing up blood and is to have follow up with gi in June for endoscopy.  His pmd told him to reutrn to ED immediately if he saw substance that looked like coffee grounds.  Tonight he had symptoms c.w. His reflux  And felt substance in back of his throat.  He spit this onto a napkin and saw that it looked like coffee grounds.  He proceeded to ed for evaluation.  He was previously on aspirin but this was stopped when concerns of gi bleed raised.  He denies previous gi bleed, dark tarry stools, or brbpr.  He has some baseline dyspnea, but not worsening. No chest pain or lightheadedness, or weight loss.   Past Medical History  Diagnosis Date  . Hypertension   . Hyperlipidemia   . GERD (gastroesophageal reflux disease)   . Prediabetes   . Asthma   . IBS (irritable bowel syndrome)   . Thyroid disease   . RLS (restless legs syndrome)   . Atherosclerosis of aorta ([place])     via CXR  . OSA (obstructive sleep apnea)     has a cpap-canot use it  . COPD (chronic obstructive pulmonary disease) ([place])   . Wears glasses   . Wears partial dentures     top  . Seasonal allergies   . Arthritis    Past Surgical History  Procedure Laterality Date  . Cataract extraction Bilateral   . Colonoscopy    . Upper gi endoscopy    . Pilonidal cyst / sinus excision  2000    spine  . Insertion of mesh Right 02/23/2014    Procedure: INSERTION OF MESH;  Surgeon: Joyice Faster. Cornett, MD;  Location: [facility];  Service: General;  Laterality: Right;  . Inguinal hernia repair Right 02/23/2014     Procedure: HERNIA REPAIR INGUINAL ADULT;  Surgeon: Joyice Faster. Cornett, MD;  Location: [facility];  Service: General;  Laterality: Right;   Family History  Problem Relation Age of Onset  . Heart attack Mother   . Stroke Father   . Asthma Father    Social History  Substance Use Topics  . Smoking status: Former Smoker    Quit date: 07/05/2005  . Smokeless tobacco: Never Used  . Alcohol Use: No    Review of Systems  All other systems reviewed and are negative.     Allergies  Penicillins and Sulfa antibiotics  Home Medications   Prior to Admission medications   Medication Sig Start Date End Date Taking? Authorizing Provider  alfuzosin (UROXATRAL) 10 MG 24 hr tablet Take 10 mg by mouth every evening.    Historical Provider, MD  Cholecalciferol (VITAMIN D PO) Take 7,000 Int'l Units by mouth daily.    Historical Provider, MD  fluticasone (FLONASE) 50 MCG/ACT nasal spray USE TWO SPRAYS IN EACH NOSTRIL EVERY DAY 08/10/13   Kelby Aline, PA-C  levothyroxine (SYNTHROID, LEVOTHROID) 50 MCG tablet TAKE ONE TABLET BY MOUTH ONCE DAILY. 08/29/15   [name], MD  losartan (COZAAR) 100 MG tablet 1/2-1 pill daily for blood pressure 09/04/15 09/04/18  Estill Bamberg  Silverio Lay, PA-C  omeprazole (PRILOSEC) 40 MG capsule Take 1 capsule (40 mg total) by mouth 2 (two) times daily. 10/16/15 10/15/16  Courtney Forcucci, PA-C  simvastatin (ZOCOR) 20 MG tablet TAKE ONE TABLET BY MOUTH ONCE DAILY Patient not taking: Reported on 10/18/2015 06/28/15   [name], MD  simvastatin (ZOCOR) 20 MG tablet Take 10 mg by mouth every evening.    Historical Provider, MD   BP 183/49 mmHg  Pulse 69  Temp(Src) 97.8 F (36.6 C) (Oral)  Resp 19  SpO2 95% Physical Exam  Constitutional: He is oriented to person, place, and time. He appears well-developed and well-nourished.  HENT:  Head: Normocephalic and atraumatic.  Right Ear: External ear normal.  Left Ear: External ear normal.  Mouth/Throat: Oropharynx is  clear and moist.  Eyes: Conjunctivae and EOM are normal. Pupils are equal, round, and reactive to light.  Neck: Normal range of motion. Neck supple.  Cardiovascular: Normal rate and regular rhythm.   Pulmonary/Chest: Effort normal and breath sounds normal.  Abdominal: Soft. Bowel sounds are normal. He exhibits no distension and no mass. There is no tenderness. There is no rebound and no guarding.  Genitourinary: Rectum normal. Guaiac negative stool.  Musculoskeletal: Normal range of motion. He exhibits no edema or tenderness.  Neurological: He is alert and oriented to person, place, and time. He has normal reflexes.  Skin: Skin is warm and dry.  Psychiatric: He has a normal mood and affect.  Nursing note and vitals reviewed.   ED Course  Procedures (including critical care time) Labs Review Labs Reviewed  CBC - Abnormal; Notable for the following:    Platelets 130 (*)    All other components within normal limits  COMPREHENSIVE METABOLIC PANEL - Abnormal; Notable for the following:    Glucose, Bld 150 (*)    All other components within normal limits  PROTIME-INR  I-STAT TROPOININ, ED  POC OCCULT BLOOD, ED  POC OCCULT BLOOD, ED  TYPE AND SCREEN  ABO/RH    Imaging Review Dg Chest Port 1 View  11/03/2015  CLINICAL DATA:  Coughing up black tarry substance today, seen 2 weeks ago for same, chronic shortness of breath, hypertension, COPD, former smoker, asthma EXAM: PORTABLE CHEST 1 VIEW COMPARISON:  Portable exam 2049 hours compared to 10/18/2015 FINDINGS: Normal heart size, mediastinal contours, and pulmonary vascularity. Atherosclerotic calcification aorta. Lordotic positioning. Lungs hyperinflated likely representing COPD. Calcified granuloma RIGHT apex stable. No acute infiltrate, pleural effusion, or pneumothorax. Bones demineralized. IMPRESSION: Changes of COPD and old granulomatous disease. No acute abnormalities. Electronically Signed   By: Lavonia Dana M.D.   On: 11/03/2015 21:03    I have personally reviewed and evaluated these images and lab results as part of my medical decision-making.   EKG Interpretation   Date/Time:  Saturday Nov 03 2015 20:20:07 EDT Ventricular Rate:  70 PR Interval:  220 QRS Duration: 92 QT Interval:  386 QTC Calculation: 416 R Axis:   47 Text Interpretation:  Sinus rhythm with 1st degree A-V block Otherwise  normal ECG Confirmed by Curtisha Bendix MD, Andee Poles [id number]) on 11/03/2015 9:12:08 PM      MDM   Final diagnoses:  Coffee ground emesis    Came in because he is concerned about coughing, coffee grounds. He is given Protonix here. There is no blood in his stool. He has had no further vomiting here. He is hemodynamically stable and hemoglobin is normal at 14. He has follow-up arranged outpatient with GI. We have discussed return precautions specifically  any large amount of vomiting of coffee grounds or bright red blood, weakness, lightheadedness, or rectal bleeding. He and family voice understanding. Plantar start Protonix as an outpatient.    Pattricia Boss, MD 11/05/15 0010

## 2015-11-04 LAB — ABO/RH: ABO/RH(D): B POS

## 2015-11-09 DIAGNOSIS — K222 Esophageal obstruction: Secondary | ICD-10-CM | POA: Diagnosis not present

## 2015-11-09 DIAGNOSIS — T18128A Food in esophagus causing other injury, initial encounter: Secondary | ICD-10-CM | POA: Diagnosis not present

## 2015-11-09 DIAGNOSIS — R1310 Dysphagia, unspecified: Secondary | ICD-10-CM | POA: Diagnosis not present

## 2015-11-18 ENCOUNTER — Encounter: Payer: Self-pay | Admitting: *Deleted

## 2015-11-27 ENCOUNTER — Encounter: Payer: Self-pay | Admitting: Internal Medicine

## 2015-12-05 ENCOUNTER — Other Ambulatory Visit: Payer: Self-pay | Admitting: Internal Medicine

## 2015-12-08 ENCOUNTER — Encounter: Payer: Self-pay | Admitting: *Deleted

## 2015-12-10 ENCOUNTER — Ambulatory Visit (INDEPENDENT_AMBULATORY_CARE_PROVIDER_SITE_OTHER): Payer: Medicare Other | Admitting: Internal Medicine

## 2015-12-10 VITALS — BP 138/62 | HR 76 | Temp 98.2°F | Resp 16 | Ht 64.0 in | Wt 174.0 lb

## 2015-12-10 DIAGNOSIS — Z0001 Encounter for general adult medical examination with abnormal findings: Secondary | ICD-10-CM

## 2015-12-10 DIAGNOSIS — E559 Vitamin D deficiency, unspecified: Secondary | ICD-10-CM

## 2015-12-10 DIAGNOSIS — Z0000 Encounter for general adult medical examination without abnormal findings: Secondary | ICD-10-CM | POA: Diagnosis not present

## 2015-12-10 DIAGNOSIS — K589 Irritable bowel syndrome without diarrhea: Secondary | ICD-10-CM | POA: Diagnosis not present

## 2015-12-10 DIAGNOSIS — N401 Enlarged prostate with lower urinary tract symptoms: Secondary | ICD-10-CM | POA: Diagnosis not present

## 2015-12-10 DIAGNOSIS — K219 Gastro-esophageal reflux disease without esophagitis: Secondary | ICD-10-CM

## 2015-12-10 DIAGNOSIS — G2581 Restless legs syndrome: Secondary | ICD-10-CM

## 2015-12-10 DIAGNOSIS — R6889 Other general symptoms and signs: Secondary | ICD-10-CM

## 2015-12-10 DIAGNOSIS — R7309 Other abnormal glucose: Secondary | ICD-10-CM | POA: Diagnosis not present

## 2015-12-10 DIAGNOSIS — E785 Hyperlipidemia, unspecified: Secondary | ICD-10-CM

## 2015-12-10 DIAGNOSIS — E039 Hypothyroidism, unspecified: Secondary | ICD-10-CM

## 2015-12-10 DIAGNOSIS — J45909 Unspecified asthma, uncomplicated: Secondary | ICD-10-CM | POA: Diagnosis not present

## 2015-12-10 DIAGNOSIS — E291 Testicular hypofunction: Secondary | ICD-10-CM

## 2015-12-10 DIAGNOSIS — Z79899 Other long term (current) drug therapy: Secondary | ICD-10-CM

## 2015-12-10 DIAGNOSIS — G4733 Obstructive sleep apnea (adult) (pediatric): Secondary | ICD-10-CM | POA: Diagnosis not present

## 2015-12-10 DIAGNOSIS — J449 Chronic obstructive pulmonary disease, unspecified: Secondary | ICD-10-CM

## 2015-12-10 DIAGNOSIS — N138 Other obstructive and reflux uropathy: Secondary | ICD-10-CM

## 2015-12-10 DIAGNOSIS — R7303 Prediabetes: Secondary | ICD-10-CM

## 2015-12-10 DIAGNOSIS — R0989 Other specified symptoms and signs involving the circulatory and respiratory systems: Secondary | ICD-10-CM

## 2015-12-10 DIAGNOSIS — Z1212 Encounter for screening for malignant neoplasm of rectum: Secondary | ICD-10-CM

## 2015-12-10 DIAGNOSIS — I359 Nonrheumatic aortic valve disorder, unspecified: Secondary | ICD-10-CM | POA: Diagnosis not present

## 2015-12-10 DIAGNOSIS — E349 Endocrine disorder, unspecified: Secondary | ICD-10-CM

## 2015-12-10 DIAGNOSIS — I10 Essential (primary) hypertension: Secondary | ICD-10-CM | POA: Diagnosis not present

## 2015-12-10 DIAGNOSIS — Z6829 Body mass index (BMI) 29.0-29.9, adult: Secondary | ICD-10-CM

## 2015-12-10 LAB — URINALYSIS, ROUTINE W REFLEX MICROSCOPIC
Bilirubin Urine: NEGATIVE
GLUCOSE, UA: NEGATIVE
HGB URINE DIPSTICK: NEGATIVE
LEUKOCYTES UA: NEGATIVE
NITRITE: NEGATIVE
PH: 5 (ref 5.0–8.0)
Protein, ur: NEGATIVE
SPECIFIC GRAVITY, URINE: 1.023 (ref 1.001–1.035)

## 2015-12-10 LAB — CBC WITH DIFFERENTIAL/PLATELET
BASOS ABS: 0 {cells}/uL (ref 0–200)
Basophils Relative: 0 %
EOS ABS: 120 {cells}/uL (ref 15–500)
EOS PCT: 2 %
HCT: 41.4 % (ref 38.5–50.0)
HEMOGLOBIN: 14.9 g/dL (ref 13.2–17.1)
Lymphocytes Relative: 15 %
Lymphs Abs: 900 cells/uL (ref 850–3900)
MCH: 32.5 pg (ref 27.0–33.0)
MCHC: 36 g/dL (ref 32.0–36.0)
MCV: 90.4 fL (ref 80.0–100.0)
MONOS PCT: 5 %
MPV: 10.3 fL (ref 7.5–12.5)
Monocytes Absolute: 300 cells/uL (ref 200–950)
NEUTROS PCT: 78 %
Neutro Abs: 4680 cells/uL (ref 1500–7800)
Platelets: 126 10*3/uL — ABNORMAL LOW (ref 140–400)
RBC: 4.58 MIL/uL (ref 4.20–5.80)
RDW: 14.7 % (ref 11.0–15.0)
WBC: 6 10*3/uL (ref 3.8–10.8)

## 2015-12-10 LAB — HEPATIC FUNCTION PANEL
ALBUMIN: 4.3 g/dL (ref 3.6–5.1)
ALT: 15 U/L (ref 9–46)
AST: 15 U/L (ref 10–35)
Alkaline Phosphatase: 48 U/L (ref 40–115)
BILIRUBIN DIRECT: 0.3 mg/dL — AB (ref <=0.2)
Indirect Bilirubin: 1 mg/dL (ref 0.2–1.2)
Total Bilirubin: 1.3 mg/dL — ABNORMAL HIGH (ref 0.2–1.2)
Total Protein: 6.6 g/dL (ref 6.1–8.1)

## 2015-12-10 LAB — LIPID PANEL
CHOL/HDL RATIO: 2.5 ratio (ref <=5.0)
CHOLESTEROL: 131 mg/dL (ref 125–200)
HDL: 53 mg/dL (ref >=40)
LDL Cholesterol: 64 mg/dL (ref <130)
Triglycerides: 68 mg/dL (ref <150)
VLDL: 14 mg/dL (ref <30)

## 2015-12-10 LAB — IRON AND TIBC
%SAT: 17 % (ref 15–60)
IRON: 45 ug/dL — AB (ref 50–180)
TIBC: 271 ug/dL (ref 250–425)
UIBC: 226 ug/dL (ref 125–400)

## 2015-12-10 LAB — VITAMIN B12: Vitamin B-12: 316 pg/mL (ref 200–1100)

## 2015-12-10 LAB — BASIC METABOLIC PANEL WITH GFR
BUN: 18 mg/dL (ref 7–25)
CALCIUM: 9.7 mg/dL (ref 8.6–10.3)
CHLORIDE: 104 mmol/L (ref 98–110)
CO2: 23 mmol/L (ref 20–31)
CREATININE: 0.98 mg/dL (ref 0.70–1.18)
GFR, Est African American: 87 mL/min (ref >=60)
GFR, Est Non African American: 75 mL/min (ref >=60)
Glucose, Bld: 119 mg/dL — ABNORMAL HIGH (ref 65–99)
Potassium: 4.4 mmol/L (ref 3.5–5.3)
SODIUM: 137 mmol/L (ref 135–146)

## 2015-12-10 LAB — HEMOGLOBIN A1C
HEMOGLOBIN A1C: 5.5 % (ref <5.7)
MEAN PLASMA GLUCOSE: 111 mg/dL

## 2015-12-10 LAB — TESTOSTERONE: Testosterone: 240 ng/dL — ABNORMAL LOW (ref 250–827)

## 2015-12-10 LAB — INSULIN, RANDOM: INSULIN: 9.9 u[IU]/mL (ref 2.0–19.6)

## 2015-12-10 LAB — VITAMIN D 25 HYDROXY (VIT D DEFICIENCY, FRACTURES): VIT D 25 HYDROXY: 66 ng/mL (ref 30–100)

## 2015-12-10 LAB — TSH: TSH: 4.53 mIU/L — ABNORMAL HIGH (ref 0.40–4.50)

## 2015-12-10 LAB — PSA: PSA: 6.85 ng/mL — AB (ref <=4.00)

## 2015-12-10 LAB — MAGNESIUM: MAGNESIUM: 2 mg/dL (ref 1.5–2.5)

## 2015-12-10 MED ORDER — METOCLOPRAMIDE HCL 10 MG PO TABS: 10.0000 mg | ORAL_TABLET | Freq: Three times a day (TID) | ORAL | Status: DC

## 2015-12-10 NOTE — Patient Instructions (Signed)
Please start taking 1 tablet of reglan up to 3 times daily with food to help increase the digestion in your stomach.    Please start wearing compression stockings daily.  Remove nightly.  Please take 250 mg of magnesium daily to help with muscle aching and cramping.

## 2015-12-10 NOTE — Progress Notes (Signed)
MEDICARE ANNUAL WELLNESS VISIT AND CPE  Assessment:    1. Essential hypertension  - Urinalysis, Routine w reflex microscopic (not at [facility]) - Microalbumin / creatinine urine ratio - EKG 12-Lead - [place], RETROPERITNL ABD,  LTD - TSH  2. Aortic valve disorder -murmur stable -followed by cards  3. Asthma, unspecified asthma severity, uncomplicated -cont albuterol prn -not currently on maintenance medications  4. OSA (obstructive sleep apnea) -cont use of cpap  5. COPD -quit smoking -albuterol prn  6. Gastroesophageal reflux disease, esophagitis presence not specified -recent dilitation of the esophagus -possibly some gastroparesis with this -try reglan to see if this helps with reflux  7. IBS (irritable bowel syndrome) -use miralax prn for constipation  8. Hypothyroidism, unspecified hypothyroidism type -cont levothyroxine - TSH  9. BPH w/LUTS -followed by urology -deferred prostate exam to urology - PSA  10. Carotid bruit, unspecified laterality -possibly referred noise from aortic stenosis murmur  11. Hyperlipidemia -cont meds - Lipid panel  12. Prediabetes  - Hemoglobin A1c - Insulin, random  13. RLS (restless legs syndrome)  - Iron and TIBC - Vitamin B12  14. Vitamin D deficiency -cont supplement - VITAMIN D 25 Hydroxy (Vit-D Deficiency, Fractures)  15. Medication management -cont quarterly labs  16. BMI 29.0-29.9,adult   17. Encounter for general adult medical examination with abnormal findings  - CBC with Differential/Platelet - BASIC METABOLIC PANEL WITH GFR - Hepatic function panel - Magnesium  18. Screening for rectal cancer  - POC Hemoccult Bld/Stl (3-Cd Home Screen); Future  19. Testosterone deficiency  - Testosterone     Over 40 minutes of exam, counseling, chart review and critical decision making was performed  Plan:   During the course of the visit the patient was educated and counseled about appropriate screening  and preventive services including:    Pneumococcal vaccine  Prevnar 13   Influenza vaccine  Td vaccine  Screening electrocardiogram  Bone densitometry screening  Colorectal cancer screening  Diabetes screening  Glaucoma screening  Nutrition counseling   Advanced directives: requested  Conditions/risks identified: BMI: Discussed weight loss, diet, and increase physical activity.  Increase physical activity: AHA recommends 150 minutes of physical activity a week.  Medications reviewed Diabetes is at goal, ACE/ARB therapy: Yes. Urinary Incontinence is not an issue: discussed non pharmacology and pharmacology options.  Fall risk: low- discussed PT, home fall assessment, medications.    Subjective:  Robert Schaefer is a 75 y.o. male who presents for Medicare Annual Wellness Visit and complete physical.  Date of last medicare wellness visit is 5/16.  He has had elevated blood pressure since . His blood pressure has been controlled at home, today their BP is BP: 138/62 mmHg He does not workout. He denies chest pain, shortness of breath, dizziness.  He is on cholesterol medication and denies myalgias. His cholesterol is at goal. The cholesterol last visit was:   Lab Results  Component Value Date   CHOL 134 09/04/2015   HDL 41 09/04/2015   LDLCALC 76 09/04/2015   TRIG 83 09/04/2015   CHOLHDL 3.3 09/04/2015   He has had prediabetes for years. He has been working on diet and exercise for prediabetes, and denies foot ulcerations, hyperglycemia, hypoglycemia , increased appetite, paresthesia of the feet, polydipsia, polyuria, visual disturbances and weight loss. Last A1C in the office was:  Lab Results  Component Value Date   HGBA1C 5.7* 09/04/2015   Last GFR: Lab Results  Component Value Date   GFRNONAA >60 11/03/2015   Lab  Results  Component Value Date   GFRAA >60 11/03/2015   Patient is on Vitamin D supplement.   Lab Results  Component Value Date   VD25OH 78  06/01/2015     He reports that he did go to see his GI doctor and had an upper endoscopy done and had a schatzski ring dilated.  He reports that he did better after this for a couple weeks.  He is no longer taking protonix.  He always feels like he has a full stomach.  He feels like his food doesn't digest well.  He does have vomiting after meals sometimes.    Medication Review: Current Outpatient Prescriptions on File Prior to Visit  Medication Sig Dispense Refill  . alfuzosin (UROXATRAL) 10 MG 24 hr tablet Take 10 mg by mouth every evening.    . Cholecalciferol (VITAMIN D PO) Take 1 tablet by mouth daily.     . fluticasone (FLONASE) 50 MCG/ACT nasal spray USE TWO SPRAYS IN EACH NOSTRIL EVERY DAY 16 g 3  . levothyroxine (SYNTHROID, LEVOTHROID) 50 MCG tablet TAKE ONE TABLET BY MOUTH ONCE DAILY 90 tablet 1  . losartan (COZAAR) 100 MG tablet 1/2-1 pill daily for blood pressure (Patient taking differently: Take 50-100 mg by mouth daily. ) 30 tablet 11  . simvastatin (ZOCOR) 20 MG tablet Take 10 mg by mouth every evening.     No current facility-administered medications on file prior to visit.    Current Problems (verified) Patient Active Problem List   Diagnosis Date Noted  . BMI 29.0-29.9,adult 06/01/2015  . Hypothyroidism 11/07/2014  . COPD 11/07/2014  . BPH w/LUTS 11/07/2014  . Medication management 04/11/2014  . Vitamin D deficiency 07/05/2013  . Hyperlipidemia   . Prediabetes   . Asthma   . IBS (irritable bowel syndrome)   . RLS (restless legs syndrome)   . OSA (obstructive sleep apnea)   . Aortic valve disorder 10/01/2009  . Carotid bruit present 10/01/2009  . Essential hypertension 09/28/2009  . GERD 09/28/2009    Screening Tests Immunization History  Administered Date(s) Administered  . DT 09/22/2013  . Influenza, High Dose Seasonal PF 04/11/2014, 04/20/2015  . Zoster 06/30/2005    Preventative care: Last colonoscopy: 2011  Prior vaccinations: TD or Tdap:  2015  Influenza: 2016  Pneumococcal:  Prevnar13:  Shingles/Zostavax: 2007  Names of Other Physician/Practitioners you currently use: 1. [facility] here for primary care 2. Does not see one in particular, eye doctor, last visit 2015 3. Dr.Jones , dentist, last visit 2017  Patient Care Team: [name], MD as PCP - General (Internal Medicine) Teena Irani, MD as Consulting Physician (Gastroenterology) Jari Pigg, MD as Consulting Physician (Dermatology) Larey Dresser, MD as Consulting Physician (Cardiology) Carolan Clines, MD as Consulting Physician (Urology) Erroll Luna, MD as Consulting Physician (General Surgery) Josue Hector, MD as Consulting Physician (Cardiology)  Past Surgical History  Procedure Laterality Date  . Cataract extraction Bilateral   . Colonoscopy    . Upper gi endoscopy    . Pilonidal cyst / sinus excision  2000    spine  . Insertion of mesh Right 02/23/2014    Procedure: INSERTION OF MESH;  Surgeon: Joyice Faster. Cornett, MD;  Location: [facility];  Service: General;  Laterality: Right;  . Inguinal hernia repair Right 02/23/2014    Procedure: HERNIA REPAIR INGUINAL ADULT;  Surgeon: Joyice Faster. Cornett, MD;  Location: [facility];  Service: General;  Laterality: Right;  Family History  Problem Relation Age of Onset  . Heart attack Mother   . Stroke Father   . Asthma Father    Social History  Substance Use Topics  . Smoking status: Former Smoker    Quit date: 07/05/2005  . Smokeless tobacco: Never Used  . Alcohol Use: No   Allergies  Allergen Reactions  . Penicillins Anaphylaxis and Swelling    Has patient had a PCN reaction causing immediate rash, facial/tongue/throat swelling, SOB or lightheadedness with hypotension: YES Has patient had a PCN reaction causing severe rash involving mucus membranes or skin necrosis:NO Has patient had a PCN reaction that required  hospitalization NO Has patient had a PCN reaction occurring within the last 10 years: NO If all of the above answers are "NO", then may proceed with Cephalosporin use.  . Sulfa Antibiotics Anaphylaxis and Swelling    MEDICARE WELLNESS OBJECTIVES: Tobacco use: He does not smoke.  Patient is a former smoker. If yes, counseling given Alcohol Current alcohol use: none Diet: well balanced Physical activity: Current Exercise Habits: The patient does not participate in regular exercise at present, Exercise limited by: orthopedic condition(s) Cardiac risk factors: Cardiac Risk Factors include: advanced age (>47men, >22 women);dyslipidemia;family history of premature cardiovascular disease;hypertension;male gender;obesity (BMI >30kg/m2);sedentary lifestyle Depression/mood screen:   Depression screen [facility] 2/9 12/10/2015  Decreased Interest 0  Down, Depressed, Hopeless 0  PHQ - 2 Score 0    ADLs:  In your present state of health, do you have any difficulty performing the following activities: 12/10/2015 06/02/2015  Hearing? N N  Vision? N N  Difficulty concentrating or making decisions? N N  Walking or climbing stairs? N N  Dressing or bathing? N N  Doing errands, shopping? N N  Preparing Food and eating ? N -  Using the Toilet? N -  In the past six months, have you accidently leaked urine? N -  Do you have problems with loss of bowel control? N -  Managing your Medications? N -  Managing your Finances? N -  Housekeeping or managing your Housekeeping? N -     Cognitive Testing  Alert? Yes  Normal Appearance?Yes  Oriented to person? Yes  Place? Yes   Time? Yes  Recall of three objects?  Yes  Can perform simple calculations? Yes  Displays appropriate judgment?Yes  Can read the correct time from a watch face?Yes  EOL planning: Does patient have an advance directive?: No Would patient like information on creating an advanced directive?: Yes - Educational materials given  Review of  Systems  Constitutional: Negative for fever, chills and malaise/fatigue.  HENT: Negative for congestion and sore throat.   Respiratory: Negative for cough, shortness of breath and wheezing.   Cardiovascular: Negative for chest pain, palpitations and leg swelling.  Gastrointestinal: Positive for nausea, vomiting and abdominal pain. Negative for heartburn, diarrhea, constipation, blood in stool and melena.  Genitourinary: Negative.   Musculoskeletal: Positive for myalgias and joint pain.  Skin: Negative.   Neurological: Negative for dizziness, sensory change, loss of consciousness and headaches.  Psychiatric/Behavioral: Negative for depression. The patient is not nervous/anxious and does not have insomnia.      Objective:     Today's Vitals   12/10/15 0858  BP: 138/62  Pulse: 76  Temp: 98.2 F (36.8 C)  TempSrc: Temporal  Resp: 16  Height: 5\' 4"  (1.626 m)  Weight: 174 lb (78.926 kg)   Body mass index is 29.85 kg/(m^2).  General appearance: alert, no distress, WD/WN, male  HEENT: normocephalic, sclerae anicteric, TMs pearly, nares patent, no discharge or erythema, pharynx normal Oral cavity: MMM, no lesions Neck: supple, no lymphadenopathy, no thyromegaly, no masses Heart: RRR, normal S1, S2, crescendo decresendo murmur to the 2nd right ICS with radiation to the LSB Lungs: CTA bilaterally, no wheezes, rhonchi, or rales Abdomen: +bs, soft, non tender, non distended, no masses, no hepatomegaly, no splenomegaly Musculoskeletal: nontender, no swelling, no obvious deformity Extremities: no edema, no cyanosis, no clubbing Pulses: 2+ symmetric, upper and lower extremities, normal cap refill Neurological: alert, oriented x 3, CN2-12 intact, strength normal upper extremities and lower extremities, sensation normal throughout, DTRs 2+ throughout, no cerebellar signs, gait Normal Psychiatric: normal affect, behavior normal, pleasant   Medicare Attestation I have personally reviewed: The  patient's medical and social history Their use of alcohol, tobacco or illicit drugs Their current medications and supplements The patient's functional ability including ADLs,fall risks, home safety risks, cognitive, and hearing and visual impairment Diet and physical activities Evidence for depression or mood disorders  The patient's weight, height, BMI, and visual acuity have been recorded in the chart.  I have made referrals, counseling, and provided education to the patient based on review of the above and I have provided the patient with a written personalized care plan for preventive services.     [name], PA-C   12/10/2015

## 2015-12-11 LAB — MICROALBUMIN / CREATININE URINE RATIO
Creatinine, Urine: 182 mg/dL (ref 20–370)
MICROALB UR: 4.2 mg/dL — AB
MICROALB/CREAT RATIO: 23 ug/mg{creat} (ref <30)

## 2015-12-20 DIAGNOSIS — R972 Elevated prostate specific antigen [PSA]: Secondary | ICD-10-CM | POA: Diagnosis not present

## 2016-01-28 DIAGNOSIS — R972 Elevated prostate specific antigen [PSA]: Secondary | ICD-10-CM | POA: Diagnosis not present

## 2016-02-20 DIAGNOSIS — L720 Epidermal cyst: Secondary | ICD-10-CM | POA: Diagnosis not present

## 2016-02-20 DIAGNOSIS — L814 Other melanin hyperpigmentation: Secondary | ICD-10-CM | POA: Diagnosis not present

## 2016-02-20 DIAGNOSIS — Z86018 Personal history of other benign neoplasm: Secondary | ICD-10-CM | POA: Diagnosis not present

## 2016-02-20 DIAGNOSIS — D225 Melanocytic nevi of trunk: Secondary | ICD-10-CM | POA: Diagnosis not present

## 2016-02-20 DIAGNOSIS — L738 Other specified follicular disorders: Secondary | ICD-10-CM | POA: Diagnosis not present

## 2016-02-20 DIAGNOSIS — L821 Other seborrheic keratosis: Secondary | ICD-10-CM | POA: Diagnosis not present

## 2016-02-20 DIAGNOSIS — D485 Neoplasm of uncertain behavior of skin: Secondary | ICD-10-CM | POA: Diagnosis not present

## 2016-03-21 ENCOUNTER — Ambulatory Visit (INDEPENDENT_AMBULATORY_CARE_PROVIDER_SITE_OTHER): Payer: Medicare Other | Admitting: Internal Medicine

## 2016-03-21 ENCOUNTER — Encounter: Payer: Self-pay | Admitting: Internal Medicine

## 2016-03-21 VITALS — BP 120/62 | HR 84 | Temp 97.7°F | Resp 16 | Ht 66.0 in | Wt 168.4 lb

## 2016-03-21 DIAGNOSIS — E785 Hyperlipidemia, unspecified: Secondary | ICD-10-CM

## 2016-03-21 DIAGNOSIS — E559 Vitamin D deficiency, unspecified: Secondary | ICD-10-CM

## 2016-03-21 DIAGNOSIS — R1084 Generalized abdominal pain: Secondary | ICD-10-CM | POA: Diagnosis not present

## 2016-03-21 DIAGNOSIS — R7309 Other abnormal glucose: Secondary | ICD-10-CM | POA: Diagnosis not present

## 2016-03-21 DIAGNOSIS — E039 Hypothyroidism, unspecified: Secondary | ICD-10-CM | POA: Diagnosis not present

## 2016-03-21 DIAGNOSIS — K219 Gastro-esophageal reflux disease without esophagitis: Secondary | ICD-10-CM

## 2016-03-21 DIAGNOSIS — R634 Abnormal weight loss: Secondary | ICD-10-CM | POA: Diagnosis not present

## 2016-03-21 DIAGNOSIS — I10 Essential (primary) hypertension: Secondary | ICD-10-CM

## 2016-03-21 DIAGNOSIS — Z79899 Other long term (current) drug therapy: Secondary | ICD-10-CM | POA: Diagnosis not present

## 2016-03-21 DIAGNOSIS — R7303 Prediabetes: Secondary | ICD-10-CM

## 2016-03-21 LAB — BASIC METABOLIC PANEL WITH GFR
BUN: 17 mg/dL (ref 7–25)
CALCIUM: 9.6 mg/dL (ref 8.6–10.3)
CO2: 23 mmol/L (ref 20–31)
Chloride: 106 mmol/L (ref 98–110)
Creat: 1.07 mg/dL (ref 0.70–1.18)
GFR, EST AFRICAN AMERICAN: 78 mL/min (ref >=60)
GFR, EST NON AFRICAN AMERICAN: 67 mL/min (ref >=60)
Glucose, Bld: 172 mg/dL — ABNORMAL HIGH (ref 65–99)
POTASSIUM: 4.2 mmol/L (ref 3.5–5.3)
Sodium: 140 mmol/L (ref 135–146)

## 2016-03-21 LAB — HEPATIC FUNCTION PANEL
ALBUMIN: 4 g/dL (ref 3.6–5.1)
ALK PHOS: 57 U/L (ref 40–115)
ALT: 12 U/L (ref 9–46)
AST: 15 U/L (ref 10–35)
BILIRUBIN TOTAL: 0.8 mg/dL (ref 0.2–1.2)
Bilirubin, Direct: 0.2 mg/dL (ref <=0.2)
Indirect Bilirubin: 0.6 mg/dL (ref 0.2–1.2)
Total Protein: 6.5 g/dL (ref 6.1–8.1)

## 2016-03-21 LAB — CBC WITH DIFFERENTIAL/PLATELET
BASOS PCT: 1 %
Basophils Absolute: 52 cells/uL (ref 0–200)
Eosinophils Absolute: 156 cells/uL (ref 15–500)
Eosinophils Relative: 3 %
HCT: 40.5 % (ref 38.5–50.0)
Hemoglobin: 13.9 g/dL (ref 13.2–17.1)
LYMPHS PCT: 20 %
Lymphs Abs: 1040 cells/uL (ref 850–3900)
MCH: 31 pg (ref 27.0–33.0)
MCHC: 34.3 g/dL (ref 32.0–36.0)
MCV: 90.4 fL (ref 80.0–100.0)
MONOS PCT: 6 %
MPV: 10.8 fL (ref 7.5–12.5)
Monocytes Absolute: 312 cells/uL (ref 200–950)
Neutro Abs: 3640 cells/uL (ref 1500–7800)
Neutrophils Relative %: 70 %
PLATELETS: 129 10*3/uL — AB (ref 140–400)
RBC: 4.48 MIL/uL (ref 4.20–5.80)
RDW: 14.6 % (ref 11.0–15.0)
WBC: 5.2 10*3/uL (ref 3.8–10.8)

## 2016-03-21 LAB — TSH: TSH: 3.2 mIU/L (ref 0.40–4.50)

## 2016-03-21 LAB — LIPID PANEL
CHOLESTEROL: 126 mg/dL (ref 125–200)
HDL: 41 mg/dL (ref >=40)
LDL CALC: 70 mg/dL (ref <130)
TRIGLYCERIDES: 75 mg/dL (ref <150)
Total CHOL/HDL Ratio: 3.1 Ratio (ref <=5.0)
VLDL: 15 mg/dL (ref <30)

## 2016-03-21 LAB — MAGNESIUM: Magnesium: 1.8 mg/dL (ref 1.5–2.5)

## 2016-03-21 MED ORDER — PANTOPRAZOLE SODIUM 40 MG PO TBEC: 40.0000 mg | DELAYED_RELEASE_TABLET | Freq: Every day | ORAL | 5 refills | Status: DC

## 2016-03-21 NOTE — Patient Instructions (Signed)
Food Choices for Gastroesophageal Reflux Disease, Adult When you have gastroesophageal reflux disease (GERD), the foods you eat and your eating habits are very important. Choosing the right foods can help ease the discomfort of GERD. WHAT GENERAL GUIDELINES DO I NEED TO FOLLOW?  Choose fruits, vegetables, whole grains, low-fat dairy products, and low-fat meat, fish, and poultry.  Limit fats such as oils, salad dressings, butter, nuts, and avocado.  Keep a food diary to identify foods that cause symptoms.  Avoid foods that cause reflux. These may be different for different people.  Eat frequent small meals instead of three large meals each day.  Eat your meals slowly, in a relaxed setting.  Limit fried foods.  Cook foods using methods other than frying.  Avoid drinking alcohol.  Avoid drinking large amounts of liquids with your meals.  Avoid bending over or lying down until 2-3 hours after eating. WHAT FOODS ARE NOT RECOMMENDED? The following are some foods and drinks that may worsen your symptoms: Vegetables Tomatoes. Tomato juice. Tomato and spaghetti sauce. Chili peppers. Onion and garlic. Horseradish. Fruits Oranges, grapefruit, and lemon (fruit and juice). Meats High-fat meats, fish, and poultry. This includes hot dogs, ribs, ham, sausage, salami, and bacon. Dairy Whole milk and chocolate milk. Sour cream. Cream. Butter. Ice cream. Cream cheese.  Beverages Coffee and tea, with or without caffeine. Carbonated beverages or energy drinks. Condiments Hot sauce. Barbecue sauce.  Sweets/Desserts Chocolate and cocoa. Donuts. Peppermint and spearmint. Fats and Oils High-fat foods, including [place] fries and potato chips. Other Vinegar. Strong spices, such as black pepper, white pepper, red pepper, cayenne, curry powder, cloves, ginger, and chili powder. The items listed above may not be a complete list of foods and beverages to avoid. Contact your dietitian for more  information.   +++++++++++++++++++++++++++++++  Gastroesophageal Reflux Disease, Adult Normally, food travels down the esophagus and stays in the stomach to be digested. However, when a person has gastroesophageal reflux disease (GERD), food and stomach acid move back up into the esophagus. When this happens, the esophagus becomes sore and inflamed. Over time, GERD can create small holes (ulcers) in the lining of the esophagus.  CAUSES This condition is caused by a problem with the muscle between the esophagus and the stomach (lower esophageal sphincter, or LES). Normally, the LES muscle closes after food passes through the esophagus to the stomach. When the LES is weakened or abnormal, it does not close properly, and that allows food and stomach acid to go back up into the esophagus. The LES can be weakened by certain dietary substances, medicines, and medical conditions, including:  Tobacco use.  Pregnancy.  Having a hiatal hernia.  Heavy alcohol use.  Certain foods and beverages, such as coffee, chocolate, onions, and peppermint. RISK FACTORS This condition is more likely to develop in:  People who have an increased body weight.  People who have connective tissue disorders.  People who use NSAID medicines. SYMPTOMS Symptoms of this condition include:  Heartburn.  Difficult or painful swallowing.  The feeling of having a lump in the throat.  Abitter taste in the mouth.  Bad breath.  Having a large amount of saliva.  Having an upset or bloated stomach.  Belching.  Chest pain.  Shortness of breath or wheezing.  Ongoing (chronic) cough or a night-time cough.  Wearing away of tooth enamel.  Weight loss. Different conditions can cause chest pain. Make sure to see your health care provider if you experience chest pain. DIAGNOSIS Your health care  provider will take a medical history and perform a physical exam. To determine if you have mild or severe GERD, your  health care provider may also monitor how you respond to treatment. You may also have other tests, including:  An endoscopy toexamine your stomach and esophagus with a small camera.  A test thatmeasures the acidity level in your esophagus.  A test thatmeasures how much pressure is on your esophagus.  A barium swallow or modified barium swallow to show the shape, size, and functioning of your esophagus. TREATMENT The goal of treatment is to help relieve your symptoms and to prevent complications. Treatment for this condition may vary depending on how severe your symptoms are. Your health care provider may recommend:  Changes to your diet.  Medicine.  Surgery. HOME CARE INSTRUCTIONS Diet  Follow a diet as recommended by your health care provider. This may involve avoiding foods and drinks such as:  Coffee and tea (with or without caffeine).  Drinks that containalcohol.  Energy drinks and sports drinks.  Carbonated drinks or sodas.  Chocolate and cocoa.  Peppermint and mint flavorings.  Garlic and onions.  Horseradish.  Spicy and acidic foods, including peppers, chili powder, curry powder, vinegar, hot sauces, and barbecue sauce.  Citrus fruit juices and citrus fruits, such as oranges, lemons, and limes.  Tomato-based foods, such as red sauce, chili, salsa, and pizza with red sauce.  Fried and fatty foods, such as donuts, french fries, potato chips, and high-fat dressings.  High-fat meats, such as hot dogs and fatty cuts of red and white meats, such as rib eye steak, sausage, ham, and bacon.  High-fat dairy items, such as whole milk, butter, and cream cheese.  Eat small, frequent meals instead of large meals.  Avoid drinking large amounts of liquid with your meals.  Avoid eating meals during the 2-3 hours before bedtime.  Avoid lying down right after you eat.  Do not exercise right after you eat. General Instructions  Pay attention to any changes in  your symptoms.  Take over-the-counter and prescription medicines only as told by your health care provider. Do not take aspirin, ibuprofen, or other NSAIDs unless your health care provider told you to do so.  Do not use any tobacco products, including cigarettes, chewing tobacco, and e-cigarettes. If you need help quitting, ask your health care provider.  Wear loose-fitting clothing. Do not wear anything tight around your waist that causes pressure on your abdomen.  Raise (elevate) the head of your bed 6 inches (15cm).  Try to reduce your stress, such as with yoga or meditation. If you need help reducing stress, ask your health care provider.  If you are overweight, reduce your weight to an amount that is healthy for you. Ask your health care provider for guidance about a safe weight loss goal.  Keep all follow-up visits as told by your health care provider. This is important. SEEK MEDICAL CARE IF:  You have new symptoms.  You have unexplained weight loss.  You have difficulty swallowing, or it hurts to swallow.  You have wheezing or a persistent cough.  Your symptoms do not improve with treatment.  You have a hoarse voice. SEEK IMMEDIATE MEDICAL CARE IF:  You have pain in your arms, neck, jaw, teeth, or back.  You feel sweaty, dizzy, or light-headed.  You have chest pain or shortness of breath.  You vomit and your vomit looks like blood or coffee grounds.  You faint.  Your stool is bloody or  black.  You cannot swallow, drink, or eat.   +++++++++++++++++++++++++++ Recommend Adult Low Dose Aspirin or  coated  Aspirin 81 mg daily  To reduce risk of Colon Cancer 20 %,  Skin Cancer 26 % ,  Melanoma 46%  and  Pancreatic cancer 60% ++++++++++++++++++++++++++++++++++++++++++++++++++++++ Vitamin D goal  is between 70-100.  Please make sure that you are taking your Vitamin D as directed.  It is very important as a natural anti-inflammatory  helping hair, skin, and  nails, as well as reducing stroke and heart attack risk.  It helps your bones and helps with mood. It also decreases numerous cancer risks so please take it as directed.  Low Vit D is associated with a 200-300% higher risk for CANCER  and 200-300% higher risk for HEART   ATTACK  &  STROKE.   .....................................[name] It is also associated with higher death rate at younger ages,  autoimmune diseases like Rheumatoid arthritis, Lupus, Multiple Sclerosis.    Also many other serious conditions, like depression, Alzheimer's Dementia, infertility, muscle aches, fatigue, fibromyalgia - just to name a few. ++++++++++++++++++++++++++++++++++++++++++++++++ Recommend the book "The END of DIETING" by Dr Excell Seltzer  & the book "The END of DIABETES " by Dr Excell Seltzer At [email or web address] - get book & Audio CD's    Being diabetic has a  300% increased risk for heart attack, stroke, cancer, and alzheimer- type vascular dementia. It is very important that you work harder with diet by avoiding all foods that are white. Avoid white rice (brown & wild rice is OK), white potatoes (sweetpotatoes in moderation is OK), White bread or wheat bread or anything made out of white flour like bagels, donuts, rolls, buns, biscuits, cakes, pastries, cookies, pizza crust, and pasta (made from white flour & egg whites) - vegetarian pasta or spinach or wheat pasta is OK. Multigrain breads like Arnold's or Pepperidge Farm, or multigrain sandwich thins or flatbreads.  Diet, exercise and weight loss can reverse and cure diabetes in the early stages.  Diet, exercise and weight loss is very important in the control and prevention of complications of diabetes which affects every system in your body, ie. Brain - dementia/stroke, eyes - glaucoma/blindness, heart - heart attack/heart failure, kidneys - dialysis, stomach - gastric paralysis, intestines - malabsorption, nerves - severe painful neuritis, circulation - gangrene & loss of a  leg(s), and finally cancer and Alzheimers.    I recommend avoid fried & greasy foods,  sweets/candy, white rice (brown or wild rice or Quinoa is OK), white potatoes (sweet potatoes are OK) - anything made from white flour - bagels, doughnuts, rolls, buns, biscuits,white and wheat breads, pizza crust and traditional pasta made of white flour & egg white(vegetarian pasta or spinach or wheat pasta is OK).  Multi-grain bread is OK - like multi-grain flat bread or sandwich thins. Avoid alcohol in excess. Exercise is also important.    Eat all the vegetables you want - avoid meat, especially red meat and dairy - especially cheese.  Cheese is the most concentrated form of trans-fats which is the worst thing to clog up our arteries. Veggie cheese is OK which can be found in the fresh produce section at [employer] or [employer] or [employer]  ++++++++++++++++++++++++++++++++++++++++++++++++++ DASH Eating Plan  DASH stands for "Dietary Approaches to Stop Hypertension."   The DASH eating plan is a healthy eating plan that has been shown to reduce high blood pressure (hypertension). Additional health benefits may include reducing the risk of type 2  diabetes mellitus, heart disease, and stroke. The DASH eating plan may also help with weight loss. WHAT DO I NEED TO KNOW ABOUT THE DASH EATING PLAN? For the DASH eating plan, you will follow these general guidelines:  Choose foods with a percent daily value for sodium of less than 5% (as listed on the food label).  Use salt-free seasonings or herbs instead of table salt or sea salt.  Check with your health care provider or pharmacist before using salt substitutes.  Eat lower-sodium products, often labeled as "lower sodium" or "no salt added."  Eat fresh foods.  Eat more vegetables, fruits, and low-fat dairy products.  Choose whole grains. Look for the word "whole" as the first word in the ingredient list.  Choose fish   Limit sweets, desserts,  sugars, and sugary drinks.  Choose heart-healthy fats.  Eat veggie cheese   Eat more home-cooked food and less restaurant, buffet, and fast food.  Limit fried foods.  Cook foods using methods other than frying.  Limit canned vegetables. If you do use them, rinse them well to decrease the sodium.  When eating at a restaurant, ask that your food be prepared with less salt, or no salt if possible.                      WHAT FOODS CAN I EAT? Read Dr Fara Olden Fuhrman's books on The End of Dieting & The End of Diabetes  Grains Whole grain or whole wheat bread. Brown rice. Whole grain or whole wheat pasta. Quinoa, bulgur, and whole grain cereals. Low-sodium cereals. Corn or whole wheat flour tortillas. Whole grain cornbread. Whole grain crackers. Low-sodium crackers.  Vegetables Fresh or frozen vegetables (raw, steamed, roasted, or grilled). Low-sodium or reduced-sodium tomato and vegetable juices. Low-sodium or reduced-sodium tomato sauce and paste. Low-sodium or reduced-sodium canned vegetables.   Fruits All fresh, canned (in natural juice), or frozen fruits.  Protein Products  All fish and seafood.  Dried beans, peas, or lentils. Unsalted nuts and seeds. Unsalted canned beans.  Dairy Low-fat dairy products, such as skim or 1% milk, 2% or reduced-fat cheeses, low-fat ricotta or cottage cheese, or plain low-fat yogurt. Low-sodium or reduced-sodium cheeses.  Fats and Oils Tub margarines without trans fats. Light or reduced-fat mayonnaise and salad dressings (reduced sodium). Avocado. Safflower, olive, or canola oils. Natural peanut or almond butter.  Other Unsalted popcorn and pretzels. The items listed above may not be a complete list of recommended foods or beverages. Contact your dietitian for more options.  +++++++++++++++++++++++++++++++++++++++++++  WHAT FOODS ARE NOT RECOMMENDED? Grains/ White flour or wheat flour White bread. White pasta. White rice. Refined cornbread. Bagels  and croissants. Crackers that contain trans fat.  Vegetables  Creamed or fried vegetables. Vegetables in a . Regular canned vegetables. Regular canned tomato sauce and paste. Regular tomato and vegetable juices.  Fruits Dried fruits. Canned fruit in light or heavy syrup. Fruit juice.  Meat and Other Protein Products Meat in general - RED meat & White meat.  Fatty cuts of meat. Ribs, chicken wings, all processed meats as bacon, sausage, bologna, salami, fatback, hot dogs, bratwurst and packaged luncheon meats.  Dairy Whole or 2% milk, cream, half-and-half, and cream cheese. Whole-fat or sweetened yogurt. Full-fat cheeses or blue cheese. Non-dairy creamers and whipped toppings. Processed cheese, cheese spreads, or cheese curds.  Condiments Onion and garlic salt, seasoned salt, table salt, and sea salt. Canned and packaged gravies. Worcestershire sauce. Tartar sauce. Barbecue sauce. Teriyaki sauce.  Soy sauce, including reduced sodium. Steak sauce. Fish sauce. Oyster sauce. Cocktail sauce. Horseradish. Ketchup and mustard. Meat flavorings and tenderizers. Bouillon cubes. Hot sauce. Tabasco sauce. Marinades. Taco seasonings. Relishes.  Fats and Oils Butter, stick margarine, lard, shortening and bacon fat. Coconut, palm kernel, or palm oils. Regular salad dressings.  Pickles and olives. Salted popcorn and pretzels.  The items listed above may not be a complete list of foods and beverages to avoid.

## 2016-03-21 NOTE — Progress Notes (Signed)
[facility] [name], M.D.        [name], P.A.-C       [name], P.A.-C  [facility]                [address] [place] [id number] Telephone [phone] Telefax [phone] ______________________________________________________________________     This very nice 75 y.o. MWM presents for 3 month follow up with Hypertension, Hyperlipidemia, COPD, OSA/CPAP, GERD, IBS, Hypothyroidism, BPH,  Pre-Diabetes and Vitamin D Deficiency. Today patient c/o frequent emesis and relatres he had a negative endoscopy by Dr Teena Irani. He has Rx'd Reglan which he has not taken. He report frequent episodes of emesis of partially digested foods and is query wrt whether he has heartburn type indigestion. He does relate 13# weight loss since last Nov. He does admit frequent dietary indiscretions. He is a prior smoker having quit about 10 years ago and does admit occas cough with yellow sputum and denies hemoptysis.                                                                             Patient is treated for HTN & BP has been controlled at home. Today's BP is 120/62. Patient has had no complaints of any cardiac type chest pain, palpitations, dyspnea/orthopnea/PND, dizziness, claudication, or dependent edema.     Hyperlipidemia is controlled with diet & meds. Patient denies myalgias or other med SE's. Last Lipids were at goal: Lab Results  Component Value Date   CHOL 131 12/10/2015   HDL 53 12/10/2015   LDLCALC 64 12/10/2015   TRIG 68 12/10/2015   CHOLHDL 2.5 12/10/2015      Also, the patient has history of PreDiabetes and has had no symptoms of reactive hypoglycemia, diabetic polys, paresthesias or visual blurring.  Last A1c was at goal: Lab Results  Component Value Date   HGBA1C 5.5 12/10/2015      Further, the patient also has history of Vitamin D Deficiency and supplements vitamin D  without any suspected side-effects. Last vitamin D was at goal: Lab Results  Component Value Date   VD25OH 66 12/10/2015   Current Outpatient Prescriptions on File Prior to Visit  Medication Sig  . alfuzosin / UROXATRAL 10 MG  Take 10 mg by mouth every evening.  [name] VITAMIN D  Take 1 tablet by mouth daily.   [name] FLONASE nasal spray USE TWO SPRAYS IN EACH NOSTRIL EVERY DAY  . levothyroxine 50 MCG tablet TAKE ONE TABLET BY MOUTH ONCE DAILY  . losartan  100 MG tablet 1/2-1 pill daily for blood pressure  . simvastatin (ZOCOR) 20 MG tablet Take 10 mg by mouth every evening.  . metoCLOPramide (REGLAN) 10 MG tablet Take 1 tablet (10 mg total) by mouth 3 (three) times daily with meals. (Patient not taking: Reported on 03/21/2016)   Allergies  Allergen Reactions  . Penicillins Anaphylaxis and Swelling  . Sulfa Antibiotics Anaphylaxis and Swelling   PMHx:   Past Medical History:  Diagnosis Date  . Arthritis   . Asthma   . Atherosclerosis of  aorta ([place])    via CXR  . COPD (chronic obstructive pulmonary disease) ([place])   . GERD (gastroesophageal reflux disease)   . Hyperlipidemia   . Hypertension   . IBS (irritable bowel syndrome)   . OSA (obstructive sleep apnea)    has a cpap-canot use it  . Prediabetes   . RLS (restless legs syndrome)   . Seasonal allergies   . Thyroid disease   . Wears glasses   . Wears partial dentures    top   Immunization History  Administered Date(s) Administered  . DT 09/22/2013  . Influenza, High Dose Seasonal PF 04/11/2014, 04/20/2015  . Zoster 06/30/2005   Past Surgical History:  Procedure Laterality Date  . CATARACT EXTRACTION Bilateral   . COLONOSCOPY    . INGUINAL HERNIA REPAIR Right 02/23/2014   Procedure: HERNIA REPAIR INGUINAL ADULT;  Surgeon: Joyice Faster. Cornett, MD;  Location: [place];  Service: General;  Laterality: Right;  . INSERTION OF MESH Right 02/23/2014   Procedure: INSERTION OF MESH;  Surgeon: Joyice Faster. Cornett, MD;   Location: [place];  Service: General;  Laterality: Right;  . PILONIDAL CYST / SINUS EXCISION  2000   spine  . UPPER GI ENDOSCOPY     FHx:    Reviewed / unchanged  SHx:    Reviewed / unchanged  Systems Review:  Constitutional: Denies fever, chills, wt changes, headaches, insomnia, fatigue, night sweats, change in appetite. Eyes: Denies redness, blurred vision, diplopia, discharge, itchy, watery eyes.  ENT: Denies discharge, congestion, post nasal drip, epistaxis, sore throat, earache, hearing loss, dental pain, tinnitus, vertigo, sinus pain, snoring.  CV: Denies chest pain, palpitations, irregular heartbeat, syncope, dyspnea, diaphoresis, orthopnea, PND, claudication or edema. Respiratory: denies cough, dyspnea, DOE, pleurisy, hoarseness, laryngitis, wheezing.  Gastrointestinal: Denies dysphagia, odynophagia, heartburn, reflux, water brash, abdominal pain or cramps, nausea, vomiting, bloating, diarrhea, constipation, hematemesis, melena, hematochezia  or hemorrhoids. Genitourinary: Denies dysuria, frequency, urgency, nocturia, hesitancy, discharge, hematuria or flank pain. Musculoskeletal: Denies arthralgias, myalgias, stiffness, jt. swelling, pain, limping or strain/sprain.  Skin: Denies pruritus, rash, hives, warts, acne, eczema or change in skin lesion(s). Neuro: No weakness, tremor, incoordination, spasms, paresthesia or pain. Psychiatric: Denies confusion, memory loss or sensory loss. Endo: Denies change in weight, skin or hair change.  Heme/Lymph: No excessive bleeding, bruising or enlarged lymph nodes.  Physical Exam BP 120/62   Pulse 84   Temp 97.7 F (36.5 C)   Resp 16   Ht 5\' 6"  (1.676 m)   Wt 168 lb 6.4 oz (76.4 kg)   BMI 27.18 kg/m   Appears well nourished and in no distress.  Eyes: PERRLA, EOMs, conjunctiva no swelling or erythema. Sinuses: No frontal/maxillary tenderness ENT/Mouth: EAC's clear, TM's nl w/o erythema, bulging. Nares clear w/o  erythema, swelling, exudates. Oropharynx clear without erythema or exudates. Oral hygiene is good. Tongue normal, non obstructing. Hearing intact.  Neck: Supple. Thyroid nl. Car 2+/2+ without bruits, nodes or JVD. Chest: Respirations nl with BS clear & equal w/o rales, rhonchi, wheezing or stridor.  Cor: Heart sounds normal w/ regular rate and rhythm without sig. murmurs, gallops, clicks, or rubs. Peripheral pulses normal and equal  without edema.  Abdomen: Soft & bowel sounds normal. Non-tender w/o guarding, rebound, hernias, masses, or organomegaly.  Lymphatics: Unremarkable.  Musculoskeletal: Full ROM all peripheral extremities, joint stability, 5/5 strength, and normal gait.  Skin: Warm, dry without exposed rashes, lesions or ecchymosis apparent.  Neuro: Cranial nerves intact, reflexes equal bilaterally. Sensory-motor  testing grossly intact. Tendon reflexes grossly intact.  Pysch: Alert & oriented x 3.  Insight and judgement nl & appropriate. No ideations.  Assessment and Plan:  1. Essential hypertension  - Continue medication, monitor blood pressure at home. Continue DASH diet. Reminder to go to the ER if any CP, SOB, nausea, dizziness, severe HA, changes vision/speech, left arm numbness and tingling and jaw pain.  - TSH  2. Hyperlipidemia  - Continue diet/meds, exercise,& lifestyle modifications. Continue monitor periodic cholesterol/liver & renal functions   - Lipid panel - TSH  3. Prediabetes  - Continue diet, exercise, lifestyle modifications. Monitor appropriate labs. - Hemoglobin A1c - Insulin, random  4. Vitamin D deficiency  - Continue supplementation. - VITAMIN D 25 Hydroxy   5. Hypothyroidism   6. Abnormal glucose   7. Gastroesophageal reflux disease  - pantoprazole (PROTONIX) 40 MG tablet; Take 1 tablet (40 mg total) by mouth daily.  Dispense: 30 tablet; Rf: 5 - recc start his Reglan 3 x/day  8. Medication management  - CBC with  Differential/Platelet - BASIC METABOLIC PANEL WITH GFR - Hepatic function panel - Magnesium  9. Generalized abdominal pain  - CT Abdomen Pelvis Wo Contrast; Future  10. Loss of weight  - CT Abdomen Pelvis Wo Contrast; Future   Recommended regular exercise, BP monitoring, weight control, and discussed med and SE's. Recommended labs to assess and monitor clinical status. Further disposition pending results of labs. Over 30 minutes of exam, counseling, chart review was performed

## 2016-03-22 LAB — INSULIN, RANDOM: INSULIN: 46 u[IU]/mL — AB (ref 2.0–19.6)

## 2016-03-22 LAB — HEMOGLOBIN A1C
Hgb A1c MFr Bld: 5.2 % (ref <5.7)
Mean Plasma Glucose: 103 mg/dL

## 2016-03-22 LAB — VITAMIN D 25 HYDROXY (VIT D DEFICIENCY, FRACTURES): VIT D 25 HYDROXY: 70 ng/mL (ref 30–100)

## 2016-03-25 ENCOUNTER — Other Ambulatory Visit: Payer: Self-pay | Admitting: Internal Medicine

## 2016-03-25 ENCOUNTER — Ambulatory Visit
Admission: RE | Admit: 2016-03-25 | Discharge: 2016-03-25 | Disposition: A | Discharge status: Home / Self Care | Payer: Medicare Other | Source: Ambulatory Visit | Attending: Internal Medicine | Admitting: Internal Medicine

## 2016-03-25 DIAGNOSIS — I999 Unspecified disorder of circulatory system: Secondary | ICD-10-CM

## 2016-03-25 DIAGNOSIS — R634 Abnormal weight loss: Secondary | ICD-10-CM

## 2016-03-25 DIAGNOSIS — R1084 Generalized abdominal pain: Secondary | ICD-10-CM | POA: Diagnosis not present

## 2016-03-27 ENCOUNTER — Encounter (HOSPITAL_COMMUNITY): Payer: Self-pay

## 2016-03-27 ENCOUNTER — Emergency Department (HOSPITAL_COMMUNITY): Payer: Medicare Other

## 2016-03-27 ENCOUNTER — Emergency Department (HOSPITAL_COMMUNITY)
Admission: EM | Admit: 2016-03-27 | Discharge: 2016-03-27 | Disposition: A | Discharge status: Home / Self Care | Payer: Medicare Other | Attending: Emergency Medicine | Admitting: Emergency Medicine

## 2016-03-27 DIAGNOSIS — J449 Chronic obstructive pulmonary disease, unspecified: Secondary | ICD-10-CM | POA: Diagnosis not present

## 2016-03-27 DIAGNOSIS — E039 Hypothyroidism, unspecified: Secondary | ICD-10-CM | POA: Diagnosis not present

## 2016-03-27 DIAGNOSIS — R0789 Other chest pain: Secondary | ICD-10-CM | POA: Insufficient documentation

## 2016-03-27 DIAGNOSIS — R079 Chest pain, unspecified: Secondary | ICD-10-CM | POA: Diagnosis not present

## 2016-03-27 DIAGNOSIS — Z87891 Personal history of nicotine dependence: Secondary | ICD-10-CM | POA: Insufficient documentation

## 2016-03-27 DIAGNOSIS — I10 Essential (primary) hypertension: Secondary | ICD-10-CM | POA: Diagnosis not present

## 2016-03-27 DIAGNOSIS — R0602 Shortness of breath: Secondary | ICD-10-CM | POA: Diagnosis not present

## 2016-03-27 LAB — I-STAT CHEM 8, ED
BUN: 18 mg/dL (ref 6–20)
CREATININE: 1.1 mg/dL (ref 0.61–1.24)
Calcium, Ion: 1.29 mmol/L (ref 1.15–1.40)
Chloride: 106 mmol/L (ref 101–111)
Glucose, Bld: 120 mg/dL — ABNORMAL HIGH (ref 65–99)
HEMATOCRIT: 38 % — AB (ref 39.0–52.0)
HEMOGLOBIN: 12.9 g/dL — AB (ref 13.0–17.0)
POTASSIUM: 4.3 mmol/L (ref 3.5–5.1)
Sodium: 141 mmol/L (ref 135–145)
TCO2: 25 mmol/L (ref 0–100)

## 2016-03-27 LAB — COMPREHENSIVE METABOLIC PANEL
ALBUMIN: 3.7 g/dL (ref 3.5–5.0)
ALT: 13 U/L — ABNORMAL LOW (ref 17–63)
AST: 16 U/L (ref 15–41)
Alkaline Phosphatase: 55 U/L (ref 38–126)
Anion gap: 7 (ref 5–15)
BILIRUBIN TOTAL: 1 mg/dL (ref 0.3–1.2)
BUN: 16 mg/dL (ref 6–20)
CALCIUM: 9.8 mg/dL (ref 8.9–10.3)
CO2: 25 mmol/L (ref 22–32)
Chloride: 107 mmol/L (ref 101–111)
Creatinine, Ser: 1.16 mg/dL (ref 0.61–1.24)
GFR calc Af Amer: >60 mL/min (ref >60)
GFR calc non Af Amer: 59 mL/min — ABNORMAL LOW (ref >60)
GLUCOSE: 126 mg/dL — AB (ref 65–99)
POTASSIUM: 4.3 mmol/L (ref 3.5–5.1)
SODIUM: 139 mmol/L (ref 135–145)
TOTAL PROTEIN: 6.9 g/dL (ref 6.5–8.1)

## 2016-03-27 LAB — CBC
HEMATOCRIT: 40.4 % (ref 39.0–52.0)
HEMOGLOBIN: 13.2 g/dL (ref 13.0–17.0)
MCH: 30.9 pg (ref 26.0–34.0)
MCHC: 32.7 g/dL (ref 30.0–36.0)
MCV: 94.6 fL (ref 78.0–100.0)
Platelets: 124 10*3/uL — ABNORMAL LOW (ref 150–400)
RBC: 4.27 MIL/uL (ref 4.22–5.81)
RDW: 14.9 % (ref 11.5–15.5)
WBC: 6.9 10*3/uL (ref 4.0–10.5)

## 2016-03-27 LAB — I-STAT TROPONIN, ED
TROPONIN I, POC: 0.01 ng/mL (ref 0.00–0.08)
TROPONIN I, POC: 0 ng/mL (ref 0.00–0.08)

## 2016-03-27 LAB — LIPASE, BLOOD: LIPASE: 61 U/L — AB (ref 11–51)

## 2016-03-27 MED ORDER — IOPAMIDOL (ISOVUE-370) INJECTION 76%
INTRAVENOUS | Status: AC
  Administered 2016-03-27: 100 mL
  Filled 2016-03-27: qty 100

## 2016-03-27 NOTE — ED Notes (Signed)
O2 sat noted at 88% with even pleth, O2 increased to 3 liters via nasal cannula.

## 2016-03-27 NOTE — ED Notes (Signed)
MD at bedside. 

## 2016-03-27 NOTE — ED Triage Notes (Addendum)
Pt presents with onset of L sided chest pain that woke him from sleep this morning.  Pt reports pain worsens when he breathes, reports L shoulder pain x 2 weeks - reports pain worsens when he lifts his arm, but describes pain as a throbbing without movement - denies any injury.  Pt reports productive cough with yellow phlegm but reports its normal for him.  Pt took ASA 324mg  PTA.

## 2016-03-27 NOTE — ED Notes (Signed)
2 liters O2 applied via nasal cannula with sat increasing to 91%.

## 2016-03-27 NOTE — ED Provider Notes (Signed)
[name] DEPT Provider Note   CSN: [id number] Arrival date & time: 03/27/16  [id number]     History   Chief Complaint Chief Complaint  Patient presents with  . Chest Pain    HPI Robert Schaefer is a 75 y.o. male.  The history is provided by the patient.  Chest Pain   This is a new problem. The current episode started 3 to 5 hours ago. Episode frequency: intermittenly. The problem has not changed since onset.The pain is associated with breathing. The pain is present in the lateral region. The pain is moderate. The quality of the pain is described as pleuritic. The pain does not radiate. Associated symptoms include cough, shortness of breath and sputum production. Pertinent negatives include no abdominal pain, no back pain, no fever, no lower extremity edema, no palpitations and no vomiting.  His past medical history is significant for COPD, hyperlipidemia and hypertension.  Pertinent negatives for past medical history include no DVT, no MI, no PE and no seizures.  Procedure history is positive for exercise treadmill test.  Procedure history is negative for cardiac catheterization.    Past Medical History:  Diagnosis Date  . Arthritis   . Asthma   . Atherosclerosis of aorta ([place])    via CXR  . COPD (chronic obstructive pulmonary disease) ([place])   . GERD (gastroesophageal reflux disease)   . Hyperlipidemia   . Hypertension   . IBS (irritable bowel syndrome)   . OSA (obstructive sleep apnea)    has a cpap-canot use it  . Prediabetes   . RLS (restless legs syndrome)   . Seasonal allergies   . Thyroid disease   . Wears glasses   . Wears partial dentures    top    Patient Active Problem List   Diagnosis Date Noted  . BMI 29.0-29.9,adult 06/01/2015  . Hypothyroidism 11/07/2014  . COPD 11/07/2014  . BPH w/LUTS 11/07/2014  . Medication management 04/11/2014  . Vitamin D deficiency 07/05/2013  . Hyperlipidemia   . Prediabetes   . Asthma   . IBS (irritable bowel syndrome)    . RLS (restless legs syndrome)   . OSA (obstructive sleep apnea)   . Aortic valve disorder 10/01/2009  . Carotid bruit present 10/01/2009  . Essential hypertension 09/28/2009  . GERD 09/28/2009    Past Surgical History:  Procedure Laterality Date  . CATARACT EXTRACTION Bilateral   . COLONOSCOPY    . INGUINAL HERNIA REPAIR Right 02/23/2014   Procedure: HERNIA REPAIR INGUINAL ADULT;  Surgeon: Joyice Faster. Cornett, MD;  Location: [facility];  Service: General;  Laterality: Right;  . INSERTION OF MESH Right 02/23/2014   Procedure: INSERTION OF MESH;  Surgeon: Joyice Faster. Cornett, MD;  Location: [facility];  Service: General;  Laterality: Right;  . PILONIDAL CYST / SINUS EXCISION  2000   spine  . UPPER GI ENDOSCOPY         Home Medications    Prior to Admission medications   Medication Sig Start Date End Date Taking? Authorizing Provider  alfuzosin (UROXATRAL) 10 MG 24 hr tablet Take 10 mg by mouth every evening.   Yes Historical Provider, MD  Cholecalciferol (VITAMIN D PO) Take 1 tablet by mouth daily.    Yes Historical Provider, MD  fluticasone (FLONASE) 50 MCG/ACT nasal spray USE TWO SPRAYS IN EACH NOSTRIL EVERY DAY 08/10/13  Yes Kelby Aline, PA-C  levothyroxine (SYNTHROID, LEVOTHROID) 50 MCG tablet TAKE ONE TABLET BY MOUTH ONCE DAILY 12/05/15  Yes Gwyndolyn Saxon  Melford Aase, MD  losartan (COZAAR) 100 MG tablet 1/2-1 pill daily for blood pressure Patient taking differently: Take 50 mg by mouth daily.  09/04/15 09/04/18 Yes Vicie Mutters, PA-C  pantoprazole (PROTONIX) 40 MG tablet Take 1 tablet (40 mg total) by mouth daily. 03/21/16 03/21/17 Yes [name], MD  metoCLOPramide (REGLAN) 10 MG tablet Take 1 tablet (10 mg total) by mouth 3 (three) times daily with meals. Patient not taking: Reported on 03/21/2016 12/10/15 12/09/16  Loma Sousa Forcucci, PA-C  simvastatin (ZOCOR) 20 MG tablet Take 10 mg by mouth every evening.    Historical Provider, MD    Family  History Family History  Problem Relation Age of Onset  . Heart attack Mother   . Stroke Father   . Asthma Father     Social History Social History  Substance Use Topics  . Smoking status: Former Smoker    Quit date: 07/05/2005  . Smokeless tobacco: Never Used  . Alcohol use No     Allergies   Penicillins and Sulfa antibiotics   Review of Systems Review of Systems  Constitutional: Negative for chills and fever.  HENT: Negative for ear pain and sore throat.   Eyes: Negative for pain and visual disturbance.  Respiratory: Positive for cough, sputum production and shortness of breath.   Cardiovascular: Positive for chest pain. Negative for palpitations.  Gastrointestinal: Negative for abdominal pain and vomiting.  Genitourinary: Negative for dysuria and hematuria.  Musculoskeletal: Negative for arthralgias and back pain.  Skin: Negative for color change and rash.  Neurological: Negative for seizures and syncope.  All other systems reviewed and are negative.    Physical Exam Updated Vital Signs BP (!) 140/37   Pulse (!) 57   Temp 98.2 F (36.8 C) (Oral)   Resp 19   Ht 5\' 5"  (1.651 m)   Wt 170 lb (77.1 kg)   SpO2 98%   BMI 28.29 kg/m   Physical Exam  Constitutional: He is oriented to person, place, and time. He appears well-developed and well-nourished. No distress.  HENT:  Head: Normocephalic and atraumatic.  Nose: Nose normal.  Eyes: Conjunctivae and EOM are normal. Pupils are equal, round, and reactive to light. Right eye exhibits no discharge. Left eye exhibits no discharge. No scleral icterus.  Neck: Normal range of motion. Neck supple.  Cardiovascular: Normal rate and regular rhythm.  Exam reveals no gallop and no friction rub.   No murmur heard. Pulmonary/Chest: Effort normal and breath sounds normal. No stridor. No respiratory distress. He has no rales.  Abdominal: Soft. He exhibits no distension. There is no tenderness.  Musculoskeletal: He exhibits no  edema or tenderness.  Neurological: He is alert and oriented to person, place, and time.  Skin: Skin is warm and dry. No rash noted. He is not diaphoretic. No erythema.  Psychiatric: He has a normal mood and affect.  Vitals reviewed.    ED Treatments / Results  Labs (all labs ordered are listed, but only abnormal results are displayed) Labs Reviewed  CBC - Abnormal; Notable for the following:       Result Value   Platelets 124 (*)    All other components within normal limits  COMPREHENSIVE METABOLIC PANEL - Abnormal; Notable for the following:    Glucose, Bld 126 (*)    ALT 13 (*)    GFR calc non Af Amer 59 (*)    All other components within normal limits  LIPASE, BLOOD - Abnormal; Notable for the following:    Lipase  61 (*)    All other components within normal limits  I-STAT CHEM 8, ED - Abnormal; Notable for the following:    Glucose, Bld 120 (*)    Hemoglobin 12.9 (*)    HCT 38.0 (*)    All other components within normal limits  I-STAT TROPOININ, ED  I-STAT TROPOININ, ED    EKG  EKG Interpretation  Date/Time:  Thursday March 27 2016 08:23:49 EDT Ventricular Rate:  69 PR Interval:    QRS Duration: 96 QT Interval:  391 QTC Calculation: 419 R Axis:   57 Text Interpretation:  Sinus rhythm Borderline repolarization abnormality No significant change since last tracing Confirmed by [facility] MD, PEDRO ([id number]) on 03/27/2016 9:44:05 AM       Radiology Ct Angio Chest Pe W Or Wo Contrast  Result Date: 03/27/2016 CLINICAL DATA:  Left-sided chest pain today, shortness of breath, smoking history EXAM: CT ANGIOGRAPHY CHEST WITH CONTRAST TECHNIQUE: Multidetector CT imaging of the chest was performed using the standard protocol during bolus administration of intravenous contrast. Multiplanar CT image reconstructions and MIPs were obtained to evaluate the vascular anatomy. CONTRAST:  100 cc Isovue 370 COMPARISON:  Chest x-ray of 03/27/2016, 11/03/2015, 10/18/2015, and  09/05/2015. FINDINGS: Cardiovascular: The pulmonary arteries are well opacified. There is no evidence of acute pulmonary embolism. The thoracic aorta is not as well opacified but no acute abnormality is seen. Moderate thoracic aortic atherosclerosis is present. Diffuse coronary artery calcifications are present. The mid ascending thoracic aorta measures 31 mm in diameter. Mediastinum/Nodes: There are calcified hilar and mediastinal lymph nodes present consistent with prior granulomatous disease. No adenopathy is seen. However it is noted that the esophagus is slightly dilated with fluid. This finding could be due to stricture or possibly gastroesophageal reflux versus neoplasm and clinical correlation is recommended. There is a small hiatal hernia noted. Lungs/Pleura: Within the left upper lobe abutting the pleura there is a part solid /part sub solid opacity present of 2.5 x 1.6 cm. With other changes of prior granulomatous disease, this could represent scarring. However in view of prior chest x-rays, and this opacity is only seen on the most recent chest x-ray, not on is chest x-ray of 11/03/2015. Therefore, inflammatory infectious process would seem more likely, but neoplasm cannot be excluded. A calcified granuloma is noted within the right upper lobe near the apex. No parenchymal infiltrate is seen and no pleural effusion is noted. The central airway is patent. Upper Abdomen: The upper abdomen is unremarkable. Musculoskeletal: The thoracic vertebrae are normal alignment with only mild degenerative change present. No compression deformity is seen. Review of the MIP images confirms the above findings. IMPRESSION: 1. No evidence of acute pulmonary embolism is seen. 2. However, there is a part solid- part sub solid lesion within the periphery of the left upper lobe abutting the pleura, which appears new since chest x-ray of 11/03/2015 and therefore most likely is inflammatory or infectious. Neoplasm cannot be  excluded. 3. Dilated esophagus with some fluid. Consider distal esophageal stricture, gastroesophageal reflux, or possibly distal esophageal neoplasm. Small hiatal hernia is noted. 4. Changes of prior granulomatous disease with calcified granuloma in the right upper lobe and calcified mediastinal and hilar nodes. 5. Moderate thoracic aortic atherosclerosis with diffuse coronary artery calcifications also present. Electronically Signed   By: Ivar Drape M.D.   On: 03/27/2016 10:57   Dg Chest Port 1 View  Result Date: 03/27/2016 CLINICAL DATA:  Chest pain EXAM: PORTABLE CHEST 1 VIEW COMPARISON:  Nov 03, 2015  FINDINGS: There is mild scarring in the left base. There is no edema or consolidation. Heart size and pulmonary vascularity are normal. No adenopathy. There is atherosclerotic calcification in the aortic arch region. No bone lesions. IMPRESSION: Mild scarring left base. No edema or consolidation. There is aortic atherosclerosis. Electronically Signed   By: Lowella Grip III M.D.   On: 03/27/2016 09:25    Procedures Procedures (including critical care time)  Medications Ordered in ED Medications  iopamidol (ISOVUE-370) 76 % injection (100 mLs  Contrast Given 03/27/16 1028)     Initial Impression / Assessment and Plan / ED Course  I have reviewed the triage vital signs and the nursing notes.  Pertinent labs & imaging results that were available during my care of the patient were reviewed by me and considered in my medical decision making (see chart for details).  Clinical Course    Pleuritic chest pain. EKG without acute ischemic changes or evidence of pericarditis.Chest x-ray without evidence suggestive of pneumonia, pneumothorax, pneumomediastinum.  No abnormal contour of the mediastinum to suggest dissection. No evidence of acute injuries. ACS rule out with delta troponins.   CTA obtained to rule out pulmonary embolism which revealed no evidence of pulmonary embolism however patient  did have a solid lesion within the left upper lobe, which is new from recent chest x-ray. Possible inflammatory versus infectious process however the neoplasm was unable to be excluded per radiology.  No evidence of esophageal perforation in a patient with history of esophageal stricture status post dilatation.  Presentation is classic for aortic dissection.  Patient was made aware of the incidental findings and instructed to follow-up with his primary care doctor for continued surveillance.  He is safe for discharge with strict return precautions.  Final Clinical Impressions(s) / ED Diagnoses   Final diagnoses:  Chest pain  Nonspecific chest pain   Disposition: Discharge  Condition: Good  I have discussed the results, Dx and Tx plan with the patient who expressed understanding and agree(s) with the plan. Discharge instructions discussed at great length. The patient was given strict return precautions who verbalized understanding of the instructions. No further questions at time of discharge.    Discharge Medication List as of 03/27/2016  2:29 PM      Follow Up: [name], MD [address] [place] [address] [phone]  Schedule an appointment as soon as possible for a visit  For repeat stress test.      Fatima Blank, MD 03/27/16 [phone]

## 2016-03-27 NOTE — ED Notes (Signed)
Patient transported to CT 

## 2016-03-27 NOTE — Discharge Instructions (Addendum)
On your chest CT we found some incidental findings that need to be followed up with your primary care provider:  1. solid- part sub solid lesion within the  periphery of the left upper lobe abutting the pleura, which appears  new since chest x-ray of 11/03/2015 and therefore most likely is  inflammatory or infectious. Neoplasm cannot be excluded.  2. Dilated esophagus with some fluid. Consider distal esophageal  stricture, gastroesophageal reflux, or possibly distal esophageal  neoplasm. Small hiatal hernia is noted.  3. Changes of prior granulomatous disease with calcified granuloma  in the right upper lobe and calcified mediastinal and hilar nodes.  4. Moderate thoracic aortic atherosclerosis with diffuse coronary  artery calcifications also present.

## 2016-03-27 NOTE — ED Notes (Addendum)
Pt O2 sats 88% on 2 L while sitting in bed.  Denies increased sob or increased chest pain.  O2 increased to 3L.  MD aware.

## 2016-04-03 ENCOUNTER — Ambulatory Visit (INDEPENDENT_AMBULATORY_CARE_PROVIDER_SITE_OTHER): Payer: Medicare Other | Admitting: Internal Medicine

## 2016-04-03 VITALS — BP 158/60 | HR 100 | Temp 97.3°F | Resp 16 | Ht 66.0 in | Wt 169.6 lb

## 2016-04-03 DIAGNOSIS — R634 Abnormal weight loss: Secondary | ICD-10-CM

## 2016-04-03 DIAGNOSIS — K219 Gastro-esophageal reflux disease without esophagitis: Secondary | ICD-10-CM | POA: Diagnosis not present

## 2016-04-03 DIAGNOSIS — I739 Peripheral vascular disease, unspecified: Secondary | ICD-10-CM

## 2016-04-04 ENCOUNTER — Encounter: Payer: Self-pay | Admitting: Internal Medicine

## 2016-04-04 NOTE — Progress Notes (Signed)
ADULT & ADOLESCENT INTERNAL MEDICINE   [name], M.D.    Robert Schaefer. Silverio Lay, P.A.-C      Robert Schaefer, P.A.-C  Robert Schaefer                [address] [place], [place] [id number] Telephone [phone] Telefax [phone] Subjective:    Patient ID: Robert Schaefer, male    DOB: 10/19/1940, 75 y.o.   MRN: [id number]  HPI  Patient is a very nice 75 yo seen in 2 week f/u from ER evaluation for CP with neg cardiac w/u inc neg EKG and Troponins. CTA to r/o PE was negative, but found  a 2.5 x 1.6 cm ? Nodule in the  LUL abutting the periphery- new since CXR of 10/2015. Recent abd/pelvic CT for 13#  weight loss found Left Renal Artery Stenosis, bilateral Iliac artery disease and  mesenteric artery stenosis and patient is scheduled to see Dr [name] 18 Oct  for opinion. Patient does describe Rt claudication after short distances. Recent Carotid Dopplers <50%. Mild AS  By 2D echo with EF 55-60%.  Recent EGD in May by Dr Dorinda Hill dilated a Schatzki for c/o Dysphagia. At recent 9/22 OV patient was advised to start the Reglan Dr Amedeo Plenty had Rx'd and he was also given a RX for Protonix and he reports his UGI sx's have improved.   Medication Sig  . alfuzosin (UROXATRAL) 10 MG Take  every evening.  [name] VITAMIN D Take 1 tab daily.   [name] FLONASE nasal spray USE TWO SPRAYS IN EACH NOSTRIL EVERY DAY  . levothyroxine  50 MCG TAKE ONE TABONCE DAILY  . losartan ( 100 MG TAKEs 1/2 pill daily for blood pressure  . metoCLOPramide10 MG Take 1 tab 3  times daily with meals.  . pantoprazole  40 MG Take 1 tab daily.  . simvastatin  20 MG Take 1/2  Tab (10 mg)  every evening.   Allergies  Allergen Reactions  . Penicillins Anaphylaxis and Swelling  . Sulfa Antibiotics Anaphylaxis and Swelling   Past Medical History:  Diagnosis Date  . Arthritis   . Asthma   . Atherosclerosis of aorta ([place])    via CXR  . COPD (chronic obstructive pulmonary disease) ([place])    . GERD (gastroesophageal reflux disease)   . Hyperlipidemia   . Hypertension   . IBS (irritable bowel syndrome)   . OSA (obstructive sleep apnea)    has a cpap-canot use it  . Prediabetes   . RLS (restless legs syndrome)   . Seasonal allergies   . Thyroid disease   . Wears glasses   . Wears partial dentures    top   Past Surgical History:  Procedure Laterality Date  . CATARACT EXTRACTION Bilateral   . COLONOSCOPY    . INGUINAL HERNIA REPAIR Right 02/23/2014   Procedure: HERNIA REPAIR INGUINAL ADULT;  Surgeon: Joyice Faster. Cornett, MD;  Location: [facility];  Service: General;  Laterality: Right;  . INSERTION OF MESH Right 02/23/2014   Procedure: INSERTION OF MESH;  Surgeon: Joyice Faster. Cornett, MD;  Location: [facility];  Service: General;  Laterality: Right;  . PILONIDAL CYST / SINUS EXCISION  2000   spine  . UPPER GI ENDOSCOPY     Review of Systems  10 point systems review negative except as above.    Objective:  Physical Exam  BP  158/60 - reck'd at 117/55   P 100   T97.3 F   R 16   Ht 5\' 6"    Wt 169 lb 9.6 oz  BMI 27.37   HEENT - Eac's patent. TM's Nl. EOM's full. PERRLA. NasoOroPharynx clear. Neck - supple. Thyroid not palpable. Carotids 2+ w/transmitted  bruits, no nodes, JVD Chest - Clear equal BS w/o Rales, rhonchi, wheezes. Cor - Nl HS. RRR w/gr 3/4 Ao Sys M rad to bilat carotids. PP not palp.  Abd - No palpable organomegaly, masses or tenderness. BS nl. MS- FROM w/o deformities. Muscle power, tone and bulk Nl. Gait Nl. Neuro - No obvious Cr N abnormalities. Sensory, motor and Cerebellar functions appear Nl w/o focal abnormalities.    Assessment & Plan:   1. PVD (peripheral vascular disease) with claudication (HCC)  - Consult Dr [name]  2. Gastroesophageal reflux disease  - f/u Dr Amedeo Plenty  3. Loss of weight  - ROV 2 mo to schedule f/u Chest CT-attn LUL

## 2016-04-16 ENCOUNTER — Encounter: Payer: Self-pay | Admitting: Cardiovascular Disease

## 2016-04-16 ENCOUNTER — Ambulatory Visit (INDEPENDENT_AMBULATORY_CARE_PROVIDER_SITE_OTHER): Payer: Medicare Other | Admitting: Cardiovascular Disease

## 2016-04-16 VITALS — BP 146/60 | HR 96 | Ht 65.0 in | Wt 162.6 lb

## 2016-04-16 DIAGNOSIS — I739 Peripheral vascular disease, unspecified: Secondary | ICD-10-CM | POA: Diagnosis not present

## 2016-04-16 DIAGNOSIS — I10 Essential (primary) hypertension: Secondary | ICD-10-CM

## 2016-04-16 DIAGNOSIS — I359 Nonrheumatic aortic valve disorder, unspecified: Secondary | ICD-10-CM

## 2016-04-16 DIAGNOSIS — R0989 Other specified symptoms and signs involving the circulatory and respiratory systems: Secondary | ICD-10-CM

## 2016-04-16 DIAGNOSIS — I350 Nonrheumatic aortic (valve) stenosis: Secondary | ICD-10-CM

## 2016-04-16 DIAGNOSIS — R079 Chest pain, unspecified: Secondary | ICD-10-CM

## 2016-04-16 NOTE — Assessment & Plan Note (Signed)
History of COPD with 100 pack years tobacco abuse having quit 10 years ago. He does complain of dyspnea on exertion

## 2016-04-16 NOTE — Assessment & Plan Note (Signed)
Carotid bruits on exam today. We will check carotid Doppler studies

## 2016-04-16 NOTE — Assessment & Plan Note (Signed)
History of atypical chest pain occurring several times a month both at rest and with exertion associated with shortness of breath. He did have a chest CT that showed diffuse coronary calcification. I'm going to get a formal neurologic Myoview stress test to further evaluate

## 2016-04-16 NOTE — Progress Notes (Signed)
04/16/2016 Robert Schaefer   1941/02/08  [id number]  Primary Physician MCKEOWN,WILLIAM DAVID, MD Primary Cardiologist: [name] MD Renae Gloss  HPI:  [name] is a playful 75 year old married Caucasian male father of 2 children, grandfather 5 grandchildren who is covering by his wife [name] and daughter [name] day. He was referred by Dr. Melford Aase for cardiovascular evaluation because of chest pain and claudication. His cardiovascular risk factor profile is notable for 100 pack years of tobacco abuse having quit 10 years ago. He has treated hypertension and hyperlipidemia. There is a family history of heart disease with a mother who died of a myocardial infarction age 24. He has never had a heart attack or stroke. He has had recent chest pain with a CT angiogram that ruled out pulmonary embolus but did show diffuse coronary calcification. An abdominal pelvic CT performed because of unexplained weight loss showed aortoiliac calcification size/atherosclerosis as well as atherosclerosis of the superior mesenteric artery and left renal artery. He complains of right leg claudication of the last year occurring after walking 4-5 minutes.   Current Outpatient Prescriptions  Medication Sig Dispense Refill  . alfuzosin (UROXATRAL) 10 MG 24 hr tablet Take 10 mg by mouth every evening.    [name] aspirin EC 81 MG tablet Take 81 mg by mouth daily.    . Cholecalciferol (VITAMIN D PO) Take 1 tablet by mouth daily.     . fluticasone (FLONASE) 50 MCG/ACT nasal spray USE TWO SPRAYS IN EACH NOSTRIL EVERY DAY 16 g 3  . levothyroxine (SYNTHROID, LEVOTHROID) 50 MCG tablet TAKE ONE TABLET BY MOUTH ONCE DAILY 90 tablet 1  . losartan (COZAAR) 100 MG tablet 1/2-1 pill daily for blood pressure (Patient taking differently: Take 50 mg by mouth daily. ) 30 tablet 11  . metoCLOPramide (REGLAN) 10 MG tablet Take 1 tablet (10 mg total) by mouth 3 (three) times daily with meals. 90 tablet 1  . simvastatin  (ZOCOR) 20 MG tablet Take 10 mg by mouth every evening.     No current facility-administered medications for this visit.     Allergies  Allergen Reactions  . Penicillins Anaphylaxis and Swelling  . Sulfa Antibiotics Anaphylaxis and Swelling    Social History   Social History  . Marital status: Married    Spouse name: N/A  . Number of children: N/A  . Years of education: N/A   Occupational History  . Not on file.   Social History Main Topics  . Smoking status: Former Smoker    Quit date: 07/05/2005  . Smokeless tobacco: Never Used  . Alcohol use No  . Drug use: No  . Sexual activity: Not on file   Other Topics Concern  . Not on file   Social History Narrative  . No narrative on file     Review of Systems: General: negative for chills, fever, night sweats or weight changes.  Cardiovascular: negative for chest pain, dyspnea on exertion, edema, orthopnea, palpitations, paroxysmal nocturnal dyspnea or shortness of breath Dermatological: negative for rash Respiratory: negative for cough or wheezing Urologic: negative for hematuria Abdominal: negative for nausea, vomiting, diarrhea, bright red blood per rectum, melena, or hematemesis Neurologic: negative for visual changes, syncope, or dizziness All other systems reviewed and are otherwise negative except as noted above.    Blood pressure (!) 146/60, pulse 96, height 5\' 5"  (1.651 m), weight 162 lb 9.6 oz (73.8 kg).  General appearance: alert and no distress Neck: no adenopathy, no  JVD, supple, symmetrical, trachea midline, thyroid not enlarged, symmetric, no tenderness/mass/nodules and Bilateral carotid bruits Lungs: clear to auscultation bilaterally Heart: 2/6 outflow tract murmur consistent with aortic stenosis Extremities: extremities normal, atraumatic, no cyanosis or edema and Absent right common femoral pulse without bruit  EKG not performed today  ASSESSMENT AND PLAN:   Essential hypertension History of  hypertension blood pressure measured at 146/60. He is on losartan. Continue current meds at current dosing  Aortic valve disorder History of mild aortic stenosis by 2-D echocardiogram in 2013. Does have an outflow tract murmur. I will repeat a 2-D echocardiogram  Carotid bruit present Carotid bruits on exam today. We will check carotid Doppler studies  Hyperlipidemia History of hyperlipidemia on statin therapy with recent lipid profile performed 03/19/16 revealing a total cholesterol of 126, LDL 70 and HDL 41  Claudication (HCC) History of right lower; claudication of the last year which is lifestyle limiting. Recent CT of his abdomen and pelvis suggested aortoiliac disease. I cannot palpate a pulse on the right side in his groin. I'm going to get lower extremity arterial Doppler studies to further evaluate  Chest pain History of atypical chest pain occurring several times a month both at rest and with exertion associated with shortness of breath. He did have a chest CT that showed diffuse coronary calcification. I'm going to get a formal neurologic Myoview stress test to further evaluate  COPD History of COPD with 100 pack years tobacco abuse having quit 10 years ago. He does complain of dyspnea on exertion      [name] MD [facility], [facility] 04/16/2016 3:00 PM

## 2016-04-16 NOTE — Patient Instructions (Signed)
Medication Instructions:  NO CHANGES.  Labwork: NONE  Testing/Procedures: Your physician has requested that you have an echocardiogram. Echocardiography is a painless test that uses sound waves to create images of your heart. It provides your doctor with information about the size and shape of your heart and how well your heart's chambers and valves are working. This procedure takes approximately one hour. There are no restrictions for this procedure.  Your physician has requested that you have a carotid duplex. This test is an ultrasound of the carotid arteries in your neck. It looks at blood flow through these arteries that supply the brain with blood. Allow one hour for this exam. There are no restrictions or special instructions.  Your physician has requested that you have a renal artery duplex. During this test, an ultrasound is used to evaluate blood flow to the kidneys. Allow one hour for this exam. Do not eat after midnight the day before and avoid carbonated beverages. Take your medications as you usually do.  Your physician has requested that you have a lower extremity arterial doppler- During this test, ultrasound is used to evaluate arterial blood flow in the legs. Allow approximately one hour for this exam.   Your physician has requested that you have a lexiscan myoview. For further information please visit [email or web address]. Please follow instruction sheet, as given.    Follow-Up: Your physician recommends that you schedule a follow-up appointment in: 4-6 [place].   Pharmacologic Stress Electrocardiogram A pharmacologic stress electrocardiogram is a heart (cardiac) test that uses nuclear imaging to evaluate the blood supply to your heart. This test may also be called a pharmacologic stress electrocardiography. Pharmacologic means that a medicine is used to increase your heart rate and blood pressure.  This stress test is done to find areas of  poor blood flow to the heart by determining the extent of coronary artery disease (CAD). Some people exercise on a treadmill, which naturally increases the blood flow to the heart. For those people unable to exercise on a treadmill, a medicine is used. This medicine stimulates your heart and will cause your heart to beat harder and more quickly, as if you were exercising.  Pharmacologic stress tests can help determine:  The adequacy of blood flow to your heart during increased levels of activity in order to clear you for discharge home.  The extent of coronary artery blockage caused by CAD.  Your prognosis if you have suffered a heart attack.  The effectiveness of cardiac procedures done, such as an angioplasty, which can increase the circulation in your coronary arteries.  Causes of chest pain or pressure. LET [facility] CARE PROVIDER KNOW ABOUT:  Any allergies you have.  All medicines you are taking, including vitamins, herbs, eye drops, creams, and over-the-counter medicines.  Previous problems you or members of your family have had with the use of anesthetics.  Any blood disorders you have.  Previous surgeries you have had.  Medical conditions you have.  Possibility of pregnancy, if this applies.  If you are currently breastfeeding. RISKS AND COMPLICATIONS Generally, this is a safe procedure. However, as with any procedure, complications can occur. Possible complications include:  You develop pain or pressure in the following areas:  Chest.  Jaw or neck.  Between your shoulder blades.  Radiating down your left arm.  Headache.  Dizziness or light-headedness.  Shortness of breath.  Increased or irregular heartbeat.  Low blood pressure.  Nausea or vomiting.  Flushing.  Redness  going up the arm and slight pain during injection of medicine.  Heart attack (rare). BEFORE THE PROCEDURE   Avoid all forms of caffeine for 24 hours before your test or as directed  by your health care provider. This includes coffee, tea (even decaffeinated tea), caffeinated sodas, chocolate, cocoa, and certain pain medicines.  Follow your health care provider's instructions regarding eating and drinking before the test.  Take your medicines as directed at regular times with water unless instructed otherwise. Exceptions may include:  If you have diabetes, ask how you are to take your insulin or pills. It is common to adjust insulin dosing the morning of the test.  If you are taking beta-blocker medicines, it is important to talk to your health care provider about these medicines well before the date of your test. Taking beta-blocker medicines may interfere with the test. In some cases, these medicines need to be changed or stopped 24 hours or more before the test.  If you wear a nitroglycerin patch, it may need to be removed prior to the test. Ask your health care provider if the patch should be removed before the test.  If you use an inhaler for any breathing condition, bring it with you to the test.  If you are an outpatient, bring a snack so you can eat right after the stress phase of the test.  Do not smoke for 4 hours prior to the test or as directed by your health care provider.  Do not apply lotions, powders, creams, or oils on your chest prior to the test.  Wear comfortable shoes and clothing. Let your health care provider know if you were unable to complete or follow the preparations for your test. PROCEDURE   Multiple patches (electrodes) will be put on your chest. If needed, small areas of your chest may be shaved to get better contact with the electrodes. Once the electrodes are attached to your body, multiple wires will be attached to the electrodes, and your heart rate will be monitored.  An IV access will be started. A nuclear trace (isotope) is given. The isotope may be given intravenously, or it may be swallowed. Nuclear refers to several types of  radioactive isotopes, and the nuclear isotope lights up the arteries so that the nuclear images are clear. The isotope is absorbed by your body. This results in low radiation exposure.  A resting nuclear image is taken to show how your heart functions at rest.  A medicine is given through the IV access.  A second scan is done about 1 hour after the medicine injection and determines how your heart functions under stress.  During this stress phase, you will be connected to an electrocardiogram machine. Your blood pressure and oxygen levels will be monitored. AFTER THE PROCEDURE   Your heart rate and blood pressure will be monitored after the test.  You may return to your normal schedule, including diet,activities, and medicines, unless your health care provider tells you otherwise.   This information is not intended to replace advice given to you by your health care provider. Make sure you discuss any questions you have with your health care provider.   Document Released: 11/02/2008 Document Revised: 06/21/2013 Document Reviewed: 02/21/2013 Elsevier Interactive Patient Education [employer].  If you need a refill on your cardiac medications before your next appointment, please call your pharmacy.

## 2016-04-16 NOTE — Assessment & Plan Note (Signed)
History of right lower; claudication of the last year which is lifestyle limiting. Recent CT of his abdomen and pelvis suggested aortoiliac disease. I cannot palpate a pulse on the right side in his groin. I'm going to get lower extremity arterial Doppler studies to further evaluate

## 2016-04-16 NOTE — Assessment & Plan Note (Signed)
History of hyperlipidemia on statin therapy with recent lipid profile performed 03/19/16 revealing a total cholesterol of 126, LDL 70 and HDL 41

## 2016-04-16 NOTE — Assessment & Plan Note (Signed)
History of hypertension blood pressure measured at 146/60. He is on losartan. Continue current meds at current dosing

## 2016-04-16 NOTE — Assessment & Plan Note (Signed)
History of mild aortic stenosis by 2-D echocardiogram in 2013. Does have an outflow tract murmur. I will repeat a 2-D echocardiogram

## 2016-04-18 ENCOUNTER — Telehealth (HOSPITAL_COMMUNITY): Payer: Self-pay

## 2016-04-18 NOTE — Telephone Encounter (Signed)
Encounter complete. 

## 2016-04-23 ENCOUNTER — Ambulatory Visit (HOSPITAL_COMMUNITY)
Admission: RE | Admit: 2016-04-23 | Discharge: 2016-04-23 | Disposition: A | Discharge status: Home / Self Care | Payer: Medicare Other | Source: Ambulatory Visit | Attending: Cardiovascular Disease | Admitting: Cardiovascular Disease

## 2016-04-23 ENCOUNTER — Other Ambulatory Visit: Payer: Self-pay | Admitting: Cardiovascular Disease

## 2016-04-23 DIAGNOSIS — R0989 Other specified symptoms and signs involving the circulatory and respiratory systems: Secondary | ICD-10-CM | POA: Diagnosis not present

## 2016-04-23 DIAGNOSIS — I350 Nonrheumatic aortic (valve) stenosis: Secondary | ICD-10-CM | POA: Diagnosis not present

## 2016-04-23 DIAGNOSIS — Z87891 Personal history of nicotine dependence: Secondary | ICD-10-CM | POA: Insufficient documentation

## 2016-04-23 DIAGNOSIS — R0609 Other forms of dyspnea: Secondary | ICD-10-CM | POA: Diagnosis not present

## 2016-04-23 DIAGNOSIS — I739 Peripheral vascular disease, unspecified: Secondary | ICD-10-CM | POA: Insufficient documentation

## 2016-04-23 DIAGNOSIS — G4733 Obstructive sleep apnea (adult) (pediatric): Secondary | ICD-10-CM | POA: Diagnosis not present

## 2016-04-23 DIAGNOSIS — I359 Nonrheumatic aortic valve disorder, unspecified: Secondary | ICD-10-CM | POA: Diagnosis not present

## 2016-04-23 DIAGNOSIS — R5383 Other fatigue: Secondary | ICD-10-CM | POA: Diagnosis not present

## 2016-04-23 DIAGNOSIS — Z8249 Family history of ischemic heart disease and other diseases of the circulatory system: Secondary | ICD-10-CM | POA: Insufficient documentation

## 2016-04-23 DIAGNOSIS — R079 Chest pain, unspecified: Secondary | ICD-10-CM | POA: Insufficient documentation

## 2016-04-23 DIAGNOSIS — R42 Dizziness and giddiness: Secondary | ICD-10-CM | POA: Insufficient documentation

## 2016-04-23 DIAGNOSIS — I10 Essential (primary) hypertension: Secondary | ICD-10-CM | POA: Insufficient documentation

## 2016-04-23 MED ORDER — REGADENOSON 0.4 MG/5ML IV SOLN
0.4000 mg | Freq: Once | INTRAVENOUS | Status: AC
  Administered 2016-04-23: 0.4 mg via INTRAVENOUS

## 2016-04-23 MED ORDER — TECHNETIUM TC 99M TETROFOSMIN IV KIT
10.1000 | PACK | Freq: Once | INTRAVENOUS | Status: AC | PRN
  Administered 2016-04-23: 10.1 via INTRAVENOUS
  Filled 2016-04-23: qty 11

## 2016-04-23 MED ORDER — TECHNETIUM TC 99M TETROFOSMIN IV KIT
31.2000 | PACK | Freq: Once | INTRAVENOUS | Status: AC | PRN
  Administered 2016-04-23: 31.2 via INTRAVENOUS
  Filled 2016-04-23: qty 32

## 2016-04-24 LAB — MYOCARDIAL PERFUSION IMAGING
CHL CUP NUCLEAR SDS: 3
CHL CUP NUCLEAR SRS: 3
CHL CUP NUCLEAR SSS: 5
CSEPPHR: 106 {beats}/min
LV dias vol: 108 mL (ref 62–150)
LV sys vol: 47 mL
Rest HR: 85 {beats}/min
TID: 1.11

## 2016-05-05 ENCOUNTER — Other Ambulatory Visit: Payer: Self-pay

## 2016-05-05 ENCOUNTER — Ambulatory Visit (HOSPITAL_COMMUNITY): Payer: Medicare Other | Attending: Cardiovascular Disease

## 2016-05-05 DIAGNOSIS — R7303 Prediabetes: Secondary | ICD-10-CM | POA: Insufficient documentation

## 2016-05-05 DIAGNOSIS — I340 Nonrheumatic mitral (valve) insufficiency: Secondary | ICD-10-CM | POA: Insufficient documentation

## 2016-05-05 DIAGNOSIS — G4733 Obstructive sleep apnea (adult) (pediatric): Secondary | ICD-10-CM | POA: Diagnosis not present

## 2016-05-05 DIAGNOSIS — R079 Chest pain, unspecified: Secondary | ICD-10-CM | POA: Diagnosis not present

## 2016-05-05 DIAGNOSIS — I359 Nonrheumatic aortic valve disorder, unspecified: Secondary | ICD-10-CM

## 2016-05-05 DIAGNOSIS — R0989 Other specified symptoms and signs involving the circulatory and respiratory systems: Secondary | ICD-10-CM | POA: Diagnosis not present

## 2016-05-05 DIAGNOSIS — E785 Hyperlipidemia, unspecified: Secondary | ICD-10-CM | POA: Diagnosis not present

## 2016-05-05 DIAGNOSIS — I350 Nonrheumatic aortic (valve) stenosis: Secondary | ICD-10-CM | POA: Diagnosis not present

## 2016-05-05 DIAGNOSIS — I739 Peripheral vascular disease, unspecified: Secondary | ICD-10-CM

## 2016-05-05 DIAGNOSIS — I10 Essential (primary) hypertension: Secondary | ICD-10-CM | POA: Diagnosis not present

## 2016-05-05 DIAGNOSIS — I352 Nonrheumatic aortic (valve) stenosis with insufficiency: Secondary | ICD-10-CM | POA: Insufficient documentation

## 2016-05-08 ENCOUNTER — Ambulatory Visit (HOSPITAL_COMMUNITY)
Admission: RE | Admit: 2016-05-08 | Discharge: 2016-05-08 | Disposition: A | Discharge status: Home / Self Care | Payer: Medicare Other | Source: Ambulatory Visit | Attending: Cardiology | Admitting: Cardiology

## 2016-05-08 DIAGNOSIS — I714 Abdominal aortic aneurysm, without rupture: Secondary | ICD-10-CM | POA: Diagnosis not present

## 2016-05-08 DIAGNOSIS — E1151 Type 2 diabetes mellitus with diabetic peripheral angiopathy without gangrene: Secondary | ICD-10-CM | POA: Insufficient documentation

## 2016-05-08 DIAGNOSIS — E119 Type 2 diabetes mellitus without complications: Secondary | ICD-10-CM | POA: Insufficient documentation

## 2016-05-08 DIAGNOSIS — E785 Hyperlipidemia, unspecified: Secondary | ICD-10-CM | POA: Insufficient documentation

## 2016-05-08 DIAGNOSIS — I739 Peripheral vascular disease, unspecified: Secondary | ICD-10-CM

## 2016-05-08 DIAGNOSIS — I10 Essential (primary) hypertension: Secondary | ICD-10-CM | POA: Insufficient documentation

## 2016-05-08 DIAGNOSIS — Z87891 Personal history of nicotine dependence: Secondary | ICD-10-CM | POA: Insufficient documentation

## 2016-05-08 DIAGNOSIS — I350 Nonrheumatic aortic (valve) stenosis: Secondary | ICD-10-CM | POA: Diagnosis not present

## 2016-05-08 DIAGNOSIS — R0989 Other specified symptoms and signs involving the circulatory and respiratory systems: Secondary | ICD-10-CM

## 2016-05-08 DIAGNOSIS — I6523 Occlusion and stenosis of bilateral carotid arteries: Secondary | ICD-10-CM | POA: Diagnosis not present

## 2016-05-08 DIAGNOSIS — R079 Chest pain, unspecified: Secondary | ICD-10-CM

## 2016-05-08 DIAGNOSIS — J449 Chronic obstructive pulmonary disease, unspecified: Secondary | ICD-10-CM | POA: Insufficient documentation

## 2016-05-08 DIAGNOSIS — I700 Atherosclerosis of aorta: Secondary | ICD-10-CM | POA: Insufficient documentation

## 2016-05-08 DIAGNOSIS — I70203 Unspecified atherosclerosis of native arteries of extremities, bilateral legs: Secondary | ICD-10-CM | POA: Insufficient documentation

## 2016-05-08 DIAGNOSIS — I359 Nonrheumatic aortic valve disorder, unspecified: Secondary | ICD-10-CM

## 2016-05-26 ENCOUNTER — Encounter: Payer: Self-pay | Admitting: *Deleted

## 2016-05-27 ENCOUNTER — Encounter: Payer: Self-pay | Admitting: Cardiovascular Disease

## 2016-05-27 ENCOUNTER — Ambulatory Visit (INDEPENDENT_AMBULATORY_CARE_PROVIDER_SITE_OTHER): Payer: Medicare Other | Admitting: Cardiovascular Disease

## 2016-05-27 ENCOUNTER — Other Ambulatory Visit: Payer: Self-pay | Admitting: Cardiovascular Disease

## 2016-05-27 VITALS — BP 136/59 | HR 110 | Ht 65.0 in | Wt 153.2 lb

## 2016-05-27 DIAGNOSIS — Z01818 Encounter for other preprocedural examination: Secondary | ICD-10-CM | POA: Diagnosis not present

## 2016-05-27 DIAGNOSIS — R0989 Other specified symptoms and signs involving the circulatory and respiratory systems: Secondary | ICD-10-CM

## 2016-05-27 DIAGNOSIS — I739 Peripheral vascular disease, unspecified: Secondary | ICD-10-CM

## 2016-05-27 DIAGNOSIS — D689 Coagulation defect, unspecified: Secondary | ICD-10-CM | POA: Diagnosis not present

## 2016-05-27 DIAGNOSIS — I350 Nonrheumatic aortic (valve) stenosis: Secondary | ICD-10-CM

## 2016-05-27 LAB — BASIC METABOLIC PANEL WITH GFR
BUN: 13 mg/dL (ref 7–25)
CALCIUM: 9.8 mg/dL (ref 8.6–10.3)
CHLORIDE: 104 mmol/L (ref 98–110)
CO2: 27 mmol/L (ref 20–31)
CREATININE: 1.11 mg/dL (ref 0.70–1.18)
GFR, Est African American: 74 mL/min (ref >=60)
GFR, Est Non African American: 64 mL/min (ref >=60)
Glucose, Bld: 121 mg/dL — ABNORMAL HIGH (ref 65–99)
Potassium: 4.2 mmol/L (ref 3.5–5.3)
SODIUM: 140 mmol/L (ref 135–146)

## 2016-05-27 LAB — CBC WITH DIFFERENTIAL/PLATELET
BASOS PCT: 1 %
Basophils Absolute: 44 cells/uL (ref 0–200)
EOS ABS: 88 {cells}/uL (ref 15–500)
EOS PCT: 2 %
HCT: 33.1 % — ABNORMAL LOW (ref 38.5–50.0)
Hemoglobin: 10.6 g/dL — ABNORMAL LOW (ref 13.2–17.1)
LYMPHS PCT: 22 %
Lymphs Abs: 968 cells/uL (ref 850–3900)
MCH: 28.4 pg (ref 27.0–33.0)
MCHC: 32 g/dL (ref 32.0–36.0)
MCV: 88.7 fL (ref 80.0–100.0)
MONOS PCT: 8 %
MPV: 9.8 fL (ref 7.5–12.5)
Monocytes Absolute: 352 cells/uL (ref 200–950)
NEUTROS ABS: 2948 {cells}/uL (ref 1500–7800)
Neutrophils Relative %: 67 %
PLATELETS: 154 10*3/uL (ref 140–400)
RBC: 3.73 MIL/uL — ABNORMAL LOW (ref 4.20–5.80)
RDW: 18.4 % — AB (ref 11.0–15.0)
WBC: 4.4 10*3/uL (ref 3.8–10.8)

## 2016-05-27 NOTE — Assessment & Plan Note (Signed)
Robert Schaefer was complaining of chest pain. He had coronary calcification on chest CT. Recent Myoview showed no evidence of ischemia.

## 2016-05-27 NOTE — Assessment & Plan Note (Signed)
Robert Schaefer returns safer follow-up with his Doppler studies performed in evaluation of claudication. He hasn't appears to be an occluded right common iliac artery with a right ABI 0.48. Left ABI was 1.23 with moderate disease in the origin of his left common iliac artery. He has right greater than left lower extremity claudication. We will proceed with angiography and potential intervention.

## 2016-05-27 NOTE — Progress Notes (Signed)
05/27/2016 Robert Schaefer   1941/01/30  [id number]  Primary Physician MCKEOWN,WILLIAM DAVID, MD Primary Cardiologist: [name] MD Renae Gloss  HPI:  Robert Schaefer is a delightful 75 year old married Caucasian male father of 2 children, grandfather 5 grandchildren who is covering by his wife Robert Schaefer and daughter Robert Schaefer day. He was referred by Dr. Melford Aase for cardiovascular evaluation because of chest pain and claudication. I last saw him in the office 04/16/16. His cardiovascular risk factor profile is notable for 100 pack years of tobacco abuse having quit 10 years ago. He has treated hypertension and hyperlipidemia. There is a family history of heart disease with a mother who died of a myocardial infarction age 34. He has never had a heart attack or stroke. He has had recent chest pain with a CT angiogram that ruled out pulmonary embolus but did show diffuse coronary calcification. An abdominal pelvic CT performed because of unexplained weight loss showed aortoiliac calcification size/atherosclerosis as well as atherosclerosis of the superior mesenteric artery and left renal artery. He complains of right greater than left leg claudication of the last year occurring after walking 4-5 minutes. Since I saw him in the office he's had a Myoview stress test which was nonischemic problem, a 2-D echo that showed normal LV function with moderate aortic stenosis, carotid Dopplers that showed no evidence of ICA stenosis and lower extremity Dopplers reveal a right ABI of 0.48 with an occluded right common iliac and a left ABI of 1.23 with moderate left common iliac artery stenosis. He also complains a 30 pound weight loss of and had SMA calcification on abdominal CT. He may have mesenteric ischemia as well.   Current Outpatient Prescriptions  Medication Sig Dispense Refill  . alfuzosin (UROXATRAL) 10 MG 24 hr tablet Take 10 mg by mouth every evening.    [name] aspirin EC 81 MG tablet Take 81  mg by mouth daily.    . fluticasone (FLONASE) 50 MCG/ACT nasal spray USE TWO SPRAYS IN EACH NOSTRIL EVERY DAY 16 g 3  . levothyroxine (SYNTHROID, LEVOTHROID) 50 MCG tablet TAKE ONE TABLET BY MOUTH ONCE DAILY 90 tablet 1  . losartan (COZAAR) 100 MG tablet 1/2-1 pill daily for blood pressure (Patient taking differently: Take 50 mg by mouth daily. ) 30 tablet 11  . simvastatin (ZOCOR) 20 MG tablet Take 10 mg by mouth every evening.     No current facility-administered medications for this visit.     Allergies  Allergen Reactions  . Penicillins Anaphylaxis and Swelling  . Sulfa Antibiotics Anaphylaxis and Swelling    Social History   Social History  . Marital status: Married    Spouse name: N/A  . Number of children: N/A  . Years of education: N/A   Occupational History  . Not on file.   Social History Main Topics  . Smoking status: Former Smoker    Quit date: 07/05/2005  . Smokeless tobacco: Never Used  . Alcohol use No  . Drug use: No  . Sexual activity: Not on file   Other Topics Concern  . Not on file   Social History Narrative  . No narrative on file     Review of Systems: General: negative for chills, fever, night sweats or weight changes.  Cardiovascular: negative for chest pain, dyspnea on exertion, edema, orthopnea, palpitations, paroxysmal nocturnal dyspnea or shortness of breath Dermatological: negative for rash Respiratory: negative for cough or wheezing Urologic: negative for hematuria Abdominal: negative for nausea,  vomiting, diarrhea, bright red blood per rectum, melena, or hematemesis Neurologic: negative for visual changes, syncope, or dizziness All other systems reviewed and are otherwise negative except as noted above.    Blood pressure (!) 136/59, pulse (!) 110, height 5\' 5"  (1.651 m), weight 153 lb 3.2 oz (69.5 kg).  General appearance: alert and no distress Neck: no adenopathy, no JVD, supple, symmetrical, trachea midline, thyroid not enlarged,  symmetric, no tenderness/mass/nodules and Bilateral carotid bruits Lungs: clear to auscultation bilaterally Heart: 2/6 outflow tract murmur consistent with aortic stenosis Extremities: extremities normal, atraumatic, no cyanosis or edema  EKG not performed today  ASSESSMENT AND PLAN:   Claudication [facility]) Mr. Rickert returns safer follow-up with his Doppler studies performed in evaluation of claudication. He hasn't appears to be an occluded right common iliac artery with a right ABI 0.48. Left ABI was 1.23 with moderate disease in the origin of his left common iliac artery. He has right greater than left lower extremity claudication. We will proceed with angiography and potential intervention.  Carotid bruit present History of carotid bruits with recent Dopplers that showed no evidence of ICA stenosis.  Chest pain Mr. Florkowski was complaining of chest pain. He had coronary calcification on chest CT. Recent Myoview showed no evidence of ischemia.  Aortic stenosis, moderate Recent 2-D echo performed 05/05/16 revealed moderate aortic stenosis with valve area of 1 cm 2 and a peak gradient of 54 mmHg. We will repeat a 2-D echo in 12 months.      [name] MD FACP,FACC,FAHA, [facility] 05/27/2016 11:45 AM

## 2016-05-27 NOTE — Assessment & Plan Note (Signed)
Recent 2-D echo performed 05/05/16 revealed moderate aortic stenosis with valve area of 1 cm 2 and a peak gradient of 54 mmHg. We will repeat a 2-D echo in 12 months.

## 2016-05-27 NOTE — Assessment & Plan Note (Addendum)
History of carotid bruits with recent Dopplers that showed no evidence of ICA stenosis.

## 2016-05-27 NOTE — Patient Instructions (Signed)
Dr. [name] has ordered a Lower Extremity angiogram to be done at [facility].  This procedure is going to look at the bloodflow in your lower extremities.  If Dr. [name] is able to open up the arteries, you will have to spend one night in the hospital.  If he is not able to open the arteries, you will be able to go home that same day.    After the procedure, you will not be allowed to drive for 3 days or push, pull, or lift anything greater than 10 lbs for one week.    You will be required to have the following tests prior to the procedure:  1. Blood work-the blood work can be done no more than 14 days prior to the procedure.  It can be done at any [facility] lab.  There is one downstairs on the first floor of this building, [address].   *REPS NONE  Puncture site LEFT GROIN   Other Procedures/Testing:  Your physician has requested that you have an echocardiogram in 1 year. Echocardiography is a painless test that uses sound waves to create images of your heart. It provides your doctor with information about the size and shape of your heart and how well your heart's chambers and valves are working. This procedure takes approximately one hour. There are no restrictions for this procedure.

## 2016-05-28 LAB — APTT: APTT: 31 s (ref 22–34)

## 2016-05-28 LAB — PROTIME-INR
INR: 1
Prothrombin Time: 11 s (ref 9.0–11.5)

## 2016-05-29 ENCOUNTER — Encounter (HOSPITAL_COMMUNITY)
Admission: RE | Disposition: A | Discharge status: Home / Self Care | Payer: Self-pay | Source: Ambulatory Visit | Attending: Cardiovascular Disease

## 2016-05-29 ENCOUNTER — Ambulatory Visit (HOSPITAL_COMMUNITY)
Admission: RE | Admit: 2016-05-29 | Discharge: 2016-05-31 | Disposition: A | Discharge status: Home / Self Care | Payer: Medicare Other | Source: Ambulatory Visit | Attending: Cardiovascular Disease | Admitting: Cardiovascular Disease

## 2016-05-29 ENCOUNTER — Encounter (HOSPITAL_COMMUNITY): Payer: Self-pay | Admitting: General Practice

## 2016-05-29 DIAGNOSIS — I10 Essential (primary) hypertension: Secondary | ICD-10-CM | POA: Diagnosis not present

## 2016-05-29 DIAGNOSIS — D649 Anemia, unspecified: Secondary | ICD-10-CM | POA: Diagnosis not present

## 2016-05-29 DIAGNOSIS — Z7982 Long term (current) use of aspirin: Secondary | ICD-10-CM | POA: Diagnosis not present

## 2016-05-29 DIAGNOSIS — R451 Restlessness and agitation: Secondary | ICD-10-CM | POA: Insufficient documentation

## 2016-05-29 DIAGNOSIS — R410 Disorientation, unspecified: Secondary | ICD-10-CM | POA: Diagnosis not present

## 2016-05-29 DIAGNOSIS — I70211 Atherosclerosis of native arteries of extremities with intermittent claudication, right leg: Secondary | ICD-10-CM | POA: Diagnosis present

## 2016-05-29 DIAGNOSIS — I350 Nonrheumatic aortic (valve) stenosis: Secondary | ICD-10-CM | POA: Insufficient documentation

## 2016-05-29 DIAGNOSIS — Z79899 Other long term (current) drug therapy: Secondary | ICD-10-CM | POA: Insufficient documentation

## 2016-05-29 DIAGNOSIS — Z9889 Other specified postprocedural states: Secondary | ICD-10-CM | POA: Insufficient documentation

## 2016-05-29 DIAGNOSIS — Z87891 Personal history of nicotine dependence: Secondary | ICD-10-CM | POA: Diagnosis not present

## 2016-05-29 DIAGNOSIS — I739 Peripheral vascular disease, unspecified: Secondary | ICD-10-CM

## 2016-05-29 DIAGNOSIS — I70213 Atherosclerosis of native arteries of extremities with intermittent claudication, bilateral legs: Secondary | ICD-10-CM | POA: Diagnosis not present

## 2016-05-29 DIAGNOSIS — Z7902 Long term (current) use of antithrombotics/antiplatelets: Secondary | ICD-10-CM | POA: Diagnosis not present

## 2016-05-29 DIAGNOSIS — E785 Hyperlipidemia, unspecified: Secondary | ICD-10-CM | POA: Diagnosis not present

## 2016-05-29 DIAGNOSIS — Z880 Allergy status to penicillin: Secondary | ICD-10-CM | POA: Diagnosis not present

## 2016-05-29 DIAGNOSIS — I714 Abdominal aortic aneurysm, without rupture: Secondary | ICD-10-CM | POA: Insufficient documentation

## 2016-05-29 DIAGNOSIS — K559 Vascular disorder of intestine, unspecified: Secondary | ICD-10-CM | POA: Diagnosis not present

## 2016-05-29 HISTORY — PX: PERIPHERAL VASCULAR CATHETERIZATION: SHX172C

## 2016-05-29 LAB — POCT ACTIVATED CLOTTING TIME
ACTIVATED CLOTTING TIME: 213 s
ACTIVATED CLOTTING TIME: 230 s
ACTIVATED CLOTTING TIME: 185 s
ACTIVATED CLOTTING TIME: 169 s
Activated Clotting Time: 224 seconds
Activated Clotting Time: 241 seconds

## 2016-05-29 LAB — GLUCOSE, CAPILLARY: Glucose-Capillary: 122 mg/dL — ABNORMAL HIGH (ref 65–99)

## 2016-05-29 SURGERY — LOWER EXTREMITY ANGIOGRAPHY
Anesthesia: LOCAL

## 2016-05-29 MED ORDER — ASPIRIN EC 81 MG PO TBEC: 81.0000 mg | DELAYED_RELEASE_TABLET | Freq: Every day | ORAL | Status: DC

## 2016-05-29 MED ORDER — FENTANYL CITRATE (PF) 100 MCG/2ML IJ SOLN
INTRAMUSCULAR | Status: AC
  Filled 2016-05-29: qty 2

## 2016-05-29 MED ORDER — SODIUM CHLORIDE 0.9 % IV SOLN: INTRAVENOUS | Status: AC

## 2016-05-29 MED ORDER — FENTANYL CITRATE (PF) 100 MCG/2ML IJ SOLN
INTRAMUSCULAR | Status: DC | PRN
  Administered 2016-05-29 (×2): 25 ug via INTRAVENOUS

## 2016-05-29 MED ORDER — ASPIRIN EC 81 MG PO TBEC
81.0000 mg | DELAYED_RELEASE_TABLET | Freq: Every day | ORAL | Status: DC
  Administered 2016-05-30 – 2016-05-31 (×2): 81 mg via ORAL
  Filled 2016-05-29 (×2): qty 1

## 2016-05-29 MED ORDER — ANGIOPLASTY BOOK
Freq: Once | Status: AC
  Administered 2016-05-29: 23:00:00
  Filled 2016-05-29: qty 1

## 2016-05-29 MED ORDER — MIDAZOLAM HCL 2 MG/2ML IJ SOLN
INTRAMUSCULAR | Status: AC
  Filled 2016-05-29: qty 2

## 2016-05-29 MED ORDER — MORPHINE SULFATE (PF) 2 MG/ML IV SOLN: 2.0000 mg | INTRAVENOUS | Status: DC | PRN

## 2016-05-29 MED ORDER — SODIUM CHLORIDE 0.9% FLUSH: 3.0000 mL | INTRAVENOUS | Status: DC | PRN

## 2016-05-29 MED ORDER — LIDOCAINE HCL (PF) 1 % IJ SOLN
INTRAMUSCULAR | Status: DC | PRN
  Administered 2016-05-29: 30 mL via INTRADERMAL

## 2016-05-29 MED ORDER — HYDRALAZINE HCL 20 MG/ML IJ SOLN
INTRAMUSCULAR | Status: DC | PRN
  Administered 2016-05-29: 10 mg via INTRAVENOUS

## 2016-05-29 MED ORDER — LIDOCAINE HCL (PF) 1 % IJ SOLN
INTRAMUSCULAR | Status: AC
  Filled 2016-05-29: qty 30

## 2016-05-29 MED ORDER — HEPARIN (PORCINE) IN NACL 2-0.9 UNIT/ML-% IJ SOLN
INTRAMUSCULAR | Status: AC
  Filled 2016-05-29: qty 1000

## 2016-05-29 MED ORDER — ASPIRIN 81 MG PO CHEW
CHEWABLE_TABLET | ORAL | Status: AC
  Administered 2016-05-29: 81 mg
  Filled 2016-05-29: qty 1

## 2016-05-29 MED ORDER — ASPIRIN 81 MG PO CHEW: 81.0000 mg | CHEWABLE_TABLET | ORAL | Status: DC

## 2016-05-29 MED ORDER — CLOPIDOGREL BISULFATE 300 MG PO TABS
ORAL_TABLET | ORAL | Status: DC | PRN
  Administered 2016-05-29: 300 mg via ORAL

## 2016-05-29 MED ORDER — SODIUM CHLORIDE 0.9 % IV BOLUS (SEPSIS)
150.0000 mL | Freq: Once | INTRAVENOUS | Status: AC
  Administered 2016-05-29: 150 mL via INTRAVENOUS

## 2016-05-29 MED ORDER — ONDANSETRON HCL 4 MG/2ML IJ SOLN: 4.0000 mg | Freq: Four times a day (QID) | INTRAMUSCULAR | Status: DC | PRN

## 2016-05-29 MED ORDER — SODIUM CHLORIDE 0.9 % WEIGHT BASED INFUSION
1.0000 mL/kg/h | INTRAVENOUS | Status: DC
Start: 2016-05-29 — End: 2016-05-29

## 2016-05-29 MED ORDER — LOSARTAN POTASSIUM 50 MG PO TABS
50.0000 mg | ORAL_TABLET | Freq: Every day | ORAL | Status: DC
  Administered 2016-05-30 – 2016-05-31 (×2): 50 mg via ORAL
  Filled 2016-05-29 (×2): qty 1

## 2016-05-29 MED ORDER — IODIXANOL 320 MG/ML IV SOLN
INTRAVENOUS | Status: DC | PRN
  Administered 2016-05-29: 275 mL via INTRA_ARTERIAL

## 2016-05-29 MED ORDER — HEPARIN SODIUM (PORCINE) 1000 UNIT/ML IJ SOLN
INTRAMUSCULAR | Status: DC | PRN
  Administered 2016-05-29: 2000 [IU] via INTRAVENOUS
  Administered 2016-05-29: 3000 [IU] via INTRAVENOUS
  Administered 2016-05-29: 6000 [IU] via INTRAVENOUS

## 2016-05-29 MED ORDER — CLOPIDOGREL BISULFATE 300 MG PO TABS
ORAL_TABLET | ORAL | Status: AC
  Filled 2016-05-29: qty 1

## 2016-05-29 MED ORDER — NITROGLYCERIN IN D5W 200-5 MCG/ML-% IV SOLN
INTRAVENOUS | Status: AC
  Filled 2016-05-29: qty 250

## 2016-05-29 MED ORDER — VERAPAMIL HCL 2.5 MG/ML IV SOLN
INTRAVENOUS | Status: AC
  Filled 2016-05-29: qty 2

## 2016-05-29 MED ORDER — VIPERSLIDE LUBRICANT OPTIME
TOPICAL | Status: DC | PRN
  Administered 2016-05-29: 11:00:00 via SURGICAL_CAVITY

## 2016-05-29 MED ORDER — ALFUZOSIN HCL ER 10 MG PO TB24
10.0000 mg | ORAL_TABLET | Freq: Every day | ORAL | Status: DC
  Administered 2016-05-29 – 2016-05-30 (×2): 10 mg via ORAL
  Filled 2016-05-29 (×2): qty 1

## 2016-05-29 MED ORDER — HEPARIN SODIUM (PORCINE) 1000 UNIT/ML IJ SOLN
INTRAMUSCULAR | Status: AC
  Filled 2016-05-29: qty 1

## 2016-05-29 MED ORDER — HEPARIN (PORCINE) IN NACL 2-0.9 UNIT/ML-% IJ SOLN
INTRAMUSCULAR | Status: DC | PRN
  Administered 2016-05-29: 1000 mL

## 2016-05-29 MED ORDER — HYDRALAZINE HCL 20 MG/ML IJ SOLN
INTRAMUSCULAR | Status: AC
  Filled 2016-05-29: qty 1

## 2016-05-29 MED ORDER — ACETAMINOPHEN 325 MG PO TABS: 650.0000 mg | ORAL_TABLET | ORAL | Status: DC | PRN

## 2016-05-29 MED ORDER — HYDRALAZINE HCL 20 MG/ML IJ SOLN
10.0000 mg | INTRAMUSCULAR | Status: DC | PRN
  Administered 2016-05-29: 10 mg via INTRAVENOUS

## 2016-05-29 MED ORDER — FAMOTIDINE IN NACL 20-0.9 MG/50ML-% IV SOLN
20.0000 mg | Freq: Once | INTRAVENOUS | Status: AC
  Administered 2016-05-29: 20 mg via INTRAVENOUS

## 2016-05-29 MED ORDER — BOOST / RESOURCE BREEZE PO LIQD
1.0000 | Freq: Three times a day (TID) | ORAL | Status: DC
  Filled 2016-05-29 (×6): qty 1

## 2016-05-29 MED ORDER — CLOPIDOGREL BISULFATE 75 MG PO TABS
75.0000 mg | ORAL_TABLET | Freq: Every day | ORAL | Status: DC
  Administered 2016-05-30 – 2016-05-31 (×2): 75 mg via ORAL
  Filled 2016-05-29 (×2): qty 1

## 2016-05-29 MED ORDER — FAMOTIDINE IN NACL 20-0.9 MG/50ML-% IV SOLN
INTRAVENOUS | Status: AC
Start: 2016-05-29 — End: 2016-05-29
  Filled 2016-05-29: qty 50

## 2016-05-29 MED ORDER — NITROGLYCERIN 1 MG/10 ML FOR IR/CATH LAB
INTRA_ARTERIAL | Status: AC
  Filled 2016-05-29: qty 10

## 2016-05-29 MED ORDER — LEVOTHYROXINE SODIUM 50 MCG PO TABS
50.0000 ug | ORAL_TABLET | Freq: Every day | ORAL | Status: DC
  Administered 2016-05-30 – 2016-05-31 (×2): 50 ug via ORAL
  Filled 2016-05-29 (×2): qty 1

## 2016-05-29 MED ORDER — SODIUM CHLORIDE 0.9 % WEIGHT BASED INFUSION
3.0000 mL/kg/h | INTRAVENOUS | Status: DC
  Administered 2016-05-29: 3 mL/kg/h via INTRAVENOUS

## 2016-05-29 MED ORDER — SIMVASTATIN 5 MG PO TABS
7.0000 mg | ORAL_TABLET | ORAL | Status: DC
  Administered 2016-05-31: 09:00:00 7.5 mg via ORAL
  Filled 2016-05-29 (×2): qty 2

## 2016-05-29 MED ORDER — NITROGLYCERIN 1 MG/10 ML FOR IR/CATH LAB
INTRA_ARTERIAL | Status: DC | PRN
  Administered 2016-05-29: 200 ug via INTRA_ARTERIAL
  Administered 2016-05-29: 200 ug
  Administered 2016-05-29 (×2): 200 ug via INTRA_ARTERIAL

## 2016-05-29 MED ORDER — MIDAZOLAM HCL 2 MG/2ML IJ SOLN
INTRAMUSCULAR | Status: DC | PRN
  Administered 2016-05-29: 1 mg via INTRAVENOUS

## 2016-05-29 SURGICAL SUPPLY — 35 items
BALLN ARMADA 5X40X80 (BALLOONS) ×2
BALLN ARMADA 8X20X80 (BALLOONS) ×4
BALLN MINITREK OTW 1.20X6 (BALLOONS) ×2
BALLOON ARMADA 5X40X80 (BALLOONS) ×1 IMPLANT
BALLOON ARMADA 8X20X80 (BALLOONS) ×2 IMPLANT
BALLOON MINITREK OTW 1.20X6 (BALLOONS) IMPLANT
CATH ANGIO 5F PIGTAIL 65CM (CATHETERS) ×1 IMPLANT
CATH CXI 2.6F 65 ST (CATHETERS) ×2
CATH SPRT STRG 65X2.6FR BRD (CATHETERS) IMPLANT
CATH STRAIGHT 5FR 65CM (CATHETERS) ×2 IMPLANT
DEVICE CONTINUOUS FLUSH (MISCELLANEOUS) ×1 IMPLANT
DIAMONDBACK SOLID OAS 1.5MM (CATHETERS) ×2
DIAMONDBACK SOLID OAS 2.0MM (CATHETERS) ×2
GUIDEWIRE ASTATO XS 20G 300CM (WIRE) ×1 IMPLANT
GUIDEWIRE REGALIA .014X300CM (WIRE) ×1 IMPLANT
KIT ENCORE 26 ADVANTAGE (KITS) ×2 IMPLANT
KIT PV (KITS) ×2 IMPLANT
LUBRICANT VIPERSLIDE CORONARY (MISCELLANEOUS) ×4 IMPLANT
SHEATH BRITE TIP 6FR 35CM (SHEATH) ×1 IMPLANT
SHEATH BRITE TIP 7FR 35CM (SHEATH) ×2 IMPLANT
SHEATH PINNACLE 5F 10CM (SHEATH) ×1 IMPLANT
SHEATH PINNACLE 7F 10CM (SHEATH) ×2 IMPLANT
STENT LIFESTREAM 7X16X80 (Permanent Stent) ×1 IMPLANT
STENT LIFESTREAM 7X37X80 (Permanent Stent) ×1 IMPLANT
STENT LIFESTREAM 8X37X80 (Permanent Stent) ×1 IMPLANT
STOPCOCK MORSE 400PSI 3WAY (MISCELLANEOUS) ×2 IMPLANT
SYR MEDRAD MARK V 150ML (SYRINGE) ×2 IMPLANT
SYSTEM DIMNDBCK SLD OAS 1.5MM (CATHETERS) IMPLANT
SYSTEM DIMNDBCK SLD OAS 2.0MM (CATHETERS) IMPLANT
TAPE RADIOPAQUE TURBO (MISCELLANEOUS) ×2 IMPLANT
TRANSDUCER W/STOPCOCK (MISCELLANEOUS) ×2 IMPLANT
TRAY PV CATH (CUSTOM PROCEDURE TRAY) ×2 IMPLANT
TUBING HIGH PRESSURE 120CM (CONNECTOR) ×1 IMPLANT
WIRE HITORQ VERSACORE ST 145CM (WIRE) ×2 IMPLANT
WIRE VIPER WIRECTO 0.014 (WIRE) ×2 IMPLANT

## 2016-05-29 NOTE — Progress Notes (Signed)
32fr sheath present in left femoral artery. Slight hematoma present above sheath site. Sheath aspirated and removed , manual pressure applied for 20 minutes. No hematoma present after hemostasis.  Tegaderm dressing applied, bedrest instructions given. Left pt and dp pulses palpable.    Bedrest begins at 16:05:00

## 2016-05-29 NOTE — Progress Notes (Addendum)
Right femoral artery, 7 FR sheath removed. 25 min manual pressure. Bruising and ecchymosis inferior to site.  1 to 1/2" wide.  Soft, no ridge at this time. Distal pulses palpable, right DP.  Vitals stable, see flowsheet.  Bedrest to begin at the end of sheath pull for the left groin.  See note for left groin sheath pull for time.

## 2016-05-29 NOTE — Care Management Note (Signed)
Case Management Note  Patient Details  Name: Creedence Landaverde MRN: [id number] Date of Birth: 10/14/1940  Subjective/Objective:  S/p pv intervention ,will be on plavix and asa, NCM will cont to monitor for dc needs.                  Action/Plan:   Expected Discharge Date:                  Expected Discharge Plan:  [place]  In-House Referral:     Discharge planning Services  CM Consult  Post Acute Care Choice:    Choice offered to:     DME Arranged:    DME Agency:     HH Arranged:    [place] Agency:     Status of Service:  In process, will continue to follow  If discussed at Long Length of Stay Meetings, dates discussed:    Additional Comments:  Zenon Mayo, RN 05/29/2016, 10:00 PM

## 2016-05-29 NOTE — Discharge Instructions (Signed)
Your procedure required the use of prolonged amounts of x-ray.  Radiation side-effects are unlikely but possible.  Please have a family member inspect your chest and back area daily, for signs of redness or rash two weeks from today.  Please call your provider and tell us whether if you have concerns about your findings.

## 2016-05-29 NOTE — Progress Notes (Signed)
Dr. [name] made aware of pt's urine and nausea/vomiting. New orders received. Will continue to monitor closely.

## 2016-05-30 ENCOUNTER — Encounter (HOSPITAL_COMMUNITY): Payer: Self-pay | Admitting: Cardiovascular Disease

## 2016-05-30 DIAGNOSIS — Z7902 Long term (current) use of antithrombotics/antiplatelets: Secondary | ICD-10-CM | POA: Diagnosis not present

## 2016-05-30 DIAGNOSIS — R410 Disorientation, unspecified: Secondary | ICD-10-CM | POA: Diagnosis not present

## 2016-05-30 DIAGNOSIS — I70213 Atherosclerosis of native arteries of extremities with intermittent claudication, bilateral legs: Secondary | ICD-10-CM | POA: Diagnosis not present

## 2016-05-30 DIAGNOSIS — I10 Essential (primary) hypertension: Secondary | ICD-10-CM

## 2016-05-30 DIAGNOSIS — Z87891 Personal history of nicotine dependence: Secondary | ICD-10-CM | POA: Diagnosis not present

## 2016-05-30 DIAGNOSIS — Z7982 Long term (current) use of aspirin: Secondary | ICD-10-CM | POA: Diagnosis not present

## 2016-05-30 DIAGNOSIS — I739 Peripheral vascular disease, unspecified: Secondary | ICD-10-CM | POA: Diagnosis not present

## 2016-05-30 DIAGNOSIS — Z79899 Other long term (current) drug therapy: Secondary | ICD-10-CM | POA: Diagnosis not present

## 2016-05-30 DIAGNOSIS — D649 Anemia, unspecified: Secondary | ICD-10-CM | POA: Diagnosis not present

## 2016-05-30 LAB — BASIC METABOLIC PANEL
ANION GAP: 4 — AB (ref 5–15)
BUN: 15 mg/dL (ref 6–20)
CALCIUM: 9 mg/dL (ref 8.9–10.3)
CO2: 24 mmol/L (ref 22–32)
Chloride: 112 mmol/L — ABNORMAL HIGH (ref 101–111)
Creatinine, Ser: 0.89 mg/dL (ref 0.61–1.24)
GFR calc Af Amer: >60 mL/min (ref >60)
Glucose, Bld: 183 mg/dL — ABNORMAL HIGH (ref 65–99)
POTASSIUM: 3.6 mmol/L (ref 3.5–5.1)
SODIUM: 140 mmol/L (ref 135–145)

## 2016-05-30 LAB — CBC
HEMATOCRIT: 25 % — AB (ref 39.0–52.0)
HEMOGLOBIN: 7.8 g/dL — AB (ref 13.0–17.0)
MCH: 28.7 pg (ref 26.0–34.0)
MCHC: 31.2 g/dL (ref 30.0–36.0)
MCV: 91.9 fL (ref 78.0–100.0)
Platelets: 101 10*3/uL — ABNORMAL LOW (ref 150–400)
RBC: 2.72 MIL/uL — ABNORMAL LOW (ref 4.22–5.81)
RDW: 19.3 % — AB (ref 11.5–15.5)
WBC: 4.2 10*3/uL (ref 4.0–10.5)

## 2016-05-30 MED ORDER — HALOPERIDOL LACTATE 5 MG/ML IJ SOLN
5.0000 mg | Freq: Four times a day (QID) | INTRAMUSCULAR | Status: DC | PRN
  Administered 2016-05-30: 5 mg via INTRAVENOUS
  Filled 2016-05-30: qty 1

## 2016-05-30 MED ORDER — ANGIOPLASTY BOOK
Freq: Once | Status: AC
  Administered 2016-05-30: 23:00:00
  Filled 2016-05-30: qty 1

## 2016-05-30 MED ORDER — HALOPERIDOL LACTATE 5 MG/ML IJ SOLN
INTRAMUSCULAR | Status: AC
  Administered 2016-05-30: 5 mg via INTRAMUSCULAR
  Filled 2016-05-30: qty 1

## 2016-05-30 MED ORDER — LORAZEPAM 2 MG/ML IJ SOLN
INTRAMUSCULAR | Status: AC
  Filled 2016-05-30: qty 1

## 2016-05-30 MED ORDER — LORAZEPAM 2 MG/ML IJ SOLN
1.0000 mg | Freq: Once | INTRAMUSCULAR | Status: AC
  Administered 2016-05-30: 02:00:00 1 mg via INTRAVENOUS

## 2016-05-30 MED ORDER — ENSURE ENLIVE PO LIQD
237.0000 mL | Freq: Three times a day (TID) | ORAL | Status: DC
  Administered 2016-05-31: 09:00:00 237 mL via ORAL
  Filled 2016-05-30 (×7): qty 237

## 2016-05-30 MED ORDER — HALOPERIDOL LACTATE 5 MG/ML IJ SOLN
5.0000 mg | Freq: Once | INTRAMUSCULAR | Status: AC
  Administered 2016-05-30: 5 mg via INTRAMUSCULAR

## 2016-05-30 NOTE — Progress Notes (Addendum)
On call cardiology paged; pt disoriented x 4,  is combative pulling at iv tubing; yelling, cursing at staff, pushing staff. Have attempted to redirect attention, reorient, sit with patient 1:1, decrease stimulation. All interventions unsuccessful. Ativan 1mg  IVP given via MD order. Will continue to observe.
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
## 2016-05-30 NOTE — Progress Notes (Signed)
Initial Nutrition Assessment  DOCUMENTATION CODES:   Not applicable  INTERVENTION:    Ensure Enlive PO TID, each supplement provides 350 kcal and 20 grams of protein  NUTRITION DIAGNOSIS:   Inadequate oral intake related to dysphagia as evidenced by per patient/family report.  GOAL:   Patient will meet greater than or equal to 90% of their needs  MONITOR:   PO intake, Supplement acceptance, Labs, I & O's  REASON FOR ASSESSMENT:   Malnutrition Screening Tool    ASSESSMENT:   75 year old male admitted on 11/30 with chest pain and claudication. S/P angiography with intervention.  Spoke with patient's family at bedside. Patient has esophageal issues and has had his esophagus stretched twice in the past. He has had some swallowing issues for the past few months with decreased intake and weight loss reported. 8% weight loss within the past 6 months is not significant.  Unable to complete Nutrition-Focused physical exam at this time. Patient was just calmed down and resting during RD visit. He has been agitated this morning. He consumed ~25% of lunch and had an Ensure after lunch per discussion with nurse tech.   Labs and medications reviewed.  Diet Order:  Diet Heart Room service appropriate? Yes; Fluid consistency: Thin  Skin:  Reviewed, no issues  Last BM:  unknown  Height:   Ht Readings from Last 1 Encounters:  05/29/16 5\' 5"  (1.651 m)    Weight:   Wt Readings from Last 1 Encounters:  05/30/16 160 lb 4.4 oz (72.7 kg)    Ideal Body Weight:  61.8 kg  BMI:  Body mass index is 26.67 kg/m.  Estimated Nutritional Needs:   Kcal:  1700-1900  Protein:  85-95 gm  Fluid:  2 L  EDUCATION NEEDS:   No education needs identified at this time  Molli Barrows, [place], [place], Time Pager [phone] After Hours Pager [phone]

## 2016-05-30 NOTE — Progress Notes (Signed)
Pt laying in bed eyes closed, snoring respirations. Aroused to verbal stimuli.
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
## 2016-05-30 NOTE — Care Management Note (Signed)
Case Management Note  Patient Details  Name: Shane Rippeon MRN: [id number] Date of Birth: 03-01-1941  Subjective/Objective:  NCM spoke with patient and he gave me permission to speak with family in the room, the wife and daughter. S/p pv intervention, He lives with wife at home , he has a walker and a cane. He has no medication coverage and he pays for his medications out of pocket.  NCM informed wife the plavix is about $30.00.  He has medicare A and B only.  The daughter states he will need a HHRN for med management and COPD.  NCM gave them the [facility] agency list to look at.  NCM informed them that I did not see COPD listed as one of his dx, they stated patient does have COPD.  NCM will ask MD to order [facility] if he feels this is appropriate. NCM will cont to follow for dc needs.                 Action/Plan:   Expected Discharge Date:                  Expected Discharge Plan:  [place]  In-House Referral:     Discharge planning Services  CM Consult  Post Acute Care Choice:    Choice offered to:     DME Arranged:    DME Agency:     HH Arranged:    [place] Agency:     Status of Service:  In process, will continue to follow  If discussed at Long Length of Stay Meetings, dates discussed:    Additional Comments:  Zenon Mayo, RN 05/30/2016, 12:57 PM

## 2016-05-30 NOTE — Progress Notes (Signed)
Patient Name: Robert Schaefer Date of Encounter: 05/30/2016  Primary Cardiologist: Robert Burow MD  Hospital Problem List     Active Problems:   Claudication [facility])     Subjective   Patient confused. Not oriented to place or situation. Inappropriate response to questions. Agitation, confusion, and aggressive behavior noted by nursing staff last night. Given IV Ativan and Haldol given last night.   Inpatient Medications    Scheduled Meds: . alfuzosin  10 mg Oral QHS  . aspirin EC  81 mg Oral Daily  . clopidogrel  75 mg Oral Q breakfast  . feeding supplement  1 Container Oral TID BM  . levothyroxine  50 mcg Oral QAC breakfast  . losartan  50 mg Oral Daily  . simvastatin  7.5 mg Oral QODAY   Continuous Infusions:  PRN Meds: acetaminophen, hydrALAZINE, morphine injection, ondansetron (ZOFRAN) IV   Vital Signs    Vitals:   05/29/16 2000 05/29/16 2100 05/29/16 2200 05/30/16 0227  BP: (!) 118/49 (!) 119/42 (!) 127/34 (!) 130/38  Pulse: 87   93  Resp: (!) 25 20 14  (!) 22  Temp:    97.6 F (36.4 C)  TempSrc:    Axillary  SpO2: 95%   97%  Weight:    160 lb 4.4 oz (72.7 kg)  Height:        Intake/Output Summary (Last 24 hours) at 05/30/16 0816 Last data filed at 05/30/16 [id number]  Gross per 24 hour  Intake              375 ml  Output              850 ml  Net             -475 ml   Filed Weights   05/29/16 0730 05/30/16 0227  Weight: 153 lb (69.4 kg) 160 lb 4.4 oz (72.7 kg)    Physical Exam    GEN: Well nourished, well developed, in no acute distress.  HEENT: Grossly normal.  Neck: Supple, no JVD, carotid bruits, or masses. Cardiac: RRR, gr [id number] systolic murmur RUSB >>apex. No clubbing, cyanosis, edema.   Respiratory:  Respirations regular and unlabored, clear to auscultation bilaterally. GI: Soft, nontender, nondistended, BS + x 4. MS: no deformity or atrophy. Right groin with moderate bruising and small hematoma. Left groin without hematoma. Pedal pulses 2+ and  feet warm.  Skin: warm and dry, no rash. Neuro:  Strength and sensation are intact. Psych: AAOx3.  Normal affect.  Labs    CBC  Recent Labs  05/27/16 1217  WBC 4.4  NEUTROABS 2,948  HGB 10.6*  HCT 33.1*  MCV 88.7  PLT [id number]   Basic Metabolic Panel  Recent Labs  05/27/16 1217  NA 140  K 4.2  CL 104  CO2 27  GLUCOSE 121*  BUN 13  CREATININE 1.11  CALCIUM 9.8   Liver Function Tests No results for input(s): AST, ALT, ALKPHOS, BILITOT, PROT, ALBUMIN in the last 72 hours. No results for input(s): LIPASE, AMYLASE in the last 72 hours. Cardiac Enzymes No results for input(s): CKTOTAL, CKMB, CKMBINDEX, TROPONINI in the last 72 hours. BNP Invalid input(s): POCBNP D-Dimer No results for input(s): DDIMER in the last 72 hours. Hemoglobin A1C No results for input(s): HGBA1C in the last 72 hours. Fasting Lipid Panel No results for input(s): CHOL, HDL, LDLCALC, TRIG, CHOLHDL, LDLDIRECT in the last 72 hours. Thyroid Function Tests No results for input(s): TSH, T4TOTAL, T3FREE, THYROIDAB in the last 72  hours.  Invalid input(s): FREET3  Telemetry    NSR, sinus tachy- Personally Reviewed  ECG    N/A - Personally Reviewed  Radiology    No results found.  Cardiac Studies   PV Angiogram/Intervention    History obtained from chart review.Mr. Robert Schaefer is a delightful 75 year old married Caucasian male father of 2 children, grandfather 5 grandchildren who is covering by his wife Robert Schaefer and daughter Robert Schaefer day. He was referred by Dr. Melford Schaefer for cardiovascular evaluation because of chest pain and claudication. I last saw him in the office 04/16/16. His cardiovascular risk factor profile is notable for 100 pack years of tobacco abuse having quit 10 years ago. He has treated hypertension and hyperlipidemia. There is a family history of heart disease with a mother who died of a myocardial infarction age 29. He has never had a heart attack or stroke. He has had recent chest  pain with a CT angiogram that ruled out pulmonary embolus but did show diffuse coronary calcification. An abdominal pelvic CT performed because of unexplained weight loss showed aortoiliac calcification size/atherosclerosis as well as atherosclerosis of the superior mesenteric artery and left renal artery. He complains of right greater than leftleg claudication of the last year occurring after walking 4-5 minutes. Since I saw him in the office he's had a Myoview stress test which was nonischemic problem, a 2-D echo that showed normal LV function with moderate aortic stenosis, carotid Dopplers that showed no evidence of ICA stenosis and lower extremity Dopplers reveal a right ABI of 0.48 with an occluded right common iliac and a left ABI of 1.23 with moderate left common iliac artery stenosis. He also complains a 30 pound weight loss of and had SMA calcification on abdominal CT. He may have mesenteric ischemia as well. He presents today for abdominal angiogram, superior mesenteric Artery angiogram and endovascular therapy for lifestyle limiting claudication.  Pre Procedure Diagnosis: Peripheral arterial disease  Post Procedure Diagnosis: Performed arterial disease  Operators: Dr. Quay Schaefer  Procedures Performed:            1. Abdominal aortogram in the PA and lateral view            2. Bilateral iliac angiogram with bifemoral runoff            3. Diamondback orbital rotational atherectomy of the right and left common iliac arteries            4. PTA and covered stenting of the right and left common iliac arteries  PROCEDURE DESCRIPTION:   The patient was brought to the second floor [facility] Schaefer Cardiac cath lab in the the postabsorptive state. He was premedicated with Valium 5 mg by mouth, IV Versed and fentanyl. His right and left groins were prepped and shaved in usual sterile fashion. Xylocaine 1% was used for local anesthesia. A 5 French sheath was inserted into the left common femoral  artery  artery using standard Seldinger technique. A 5 French pigtail was placed in the midsternal abdominal aorta. Abdominal aortography was performed in the PA and lateral views. Bilateral iliac angiography with bifemoral runoff was then performed. Omnipaque dye  was used for the entirety of the case. Retrograde aortic pressure was monitored during the case.    Angiographic Data:   1: Abdominal aortogram-the abdominal aorta was fluoroscopically calcified. There is a small abdominal aortic aneurysm infrarenally. The renal arteries widely patent. The SMA had a 95% calcified aorto ostial stenosis. 2: Left lower extremity-there was a 60-70% calcified  ostial/proximal left common iliac artery stenosis. I performed a pullback gradient after administration of 200 g of intra-arterial nitroglycerin revealed 35 mmHg. The SFA was widely patent and there was three-vessel runoff 3: Right lower extremity-there was a highly calcified long segmental chronic total occlusion of the right common iliac artery reconstituting just above the hypogastric takeoff. There was a 7080% segmental mid right SFA stenosis with three-vessel runoff  IMPRESSION: Mr. Terpak has a high-grade SMA stenosis which may be continuing to abdominal pain and weight loss. He has a small abdominal aortic aneurysm and a highly calcified long segmental right common iliac artery CTO. He has right greater than left lower extremity claudication. We will proceed with attempt at percutaneous revascularization of his right common iliac artery.  Procedure Description: I obtained access in his right common femoral artery with a 6 French bright tip sheath. The patient received 11,000 units of heparin intravenously with an ACT of 230. A total of 275 mL of contrast was administered and the Case. Total fluoroscopy time was 47 minutes with a dose of 5240 MG. I had a 5 French REM catheter at the origin of the right common iliac artery through the left common  femoral sheath. I was able to get across the CTO with a 018 CXI end hole  catheter along with a 014 [place] wire upgraded to an 014 Astato 20 wire. I was unable to cross the final portion of the CTO and therefore exchanged the CXI catheter for a 014 1.2 mm x 6 mm long over-the-wire balloon. I then crossed the lesion and entered the room catheter using the "flossed technique". I was then able to flip the wire up into the aorta and exchanged for an 014 Viper wire. Following this a performed diamondback orbital rotational atherectomy with a 1.5 mm solid per up to 120,000 RPMs upgrading to a 2 mm bur. I then exchanged the 6 French bright tip over an 035 wire for a 7 French bright tip. I put a 7 French bright tip in the left common femoral artery and performed diamondback orbital rotational atherectomy with a 2 mm bur up to 120,000 RPMs. I then predilated the right common iliac artery with a 5 mm x 4 cm balloon and performed "kissing covered stenting" with a 7 mm x 37 mm long Lifestream covered stent on the right and a 8 mm x 37 on her long Lifestream covered stent on the left deployment simultaneously at the iliac bifurcation. I placed an additional 7 mm x 16 mm long Lifestream balloon overlapping the distal edge of the right iliac stent. I postdilated the entire stented segments bilaterally with 8 mm x 2 cm balloons resulting in reduction of a total right common iliac CTO to 0% residual and a 70% left 0% residual. The patient tolerated the procedure procedure well. I switched out the bright tip sheath and over 035 wires for short 7 French sheath which were then secured in place. The patient received 300 mg of by mouth Plavix.    Final Impression: Successful right common iliac CTO diamondback orbital rotational atherectomy as well as left iliac diamondback orbital rotational atherectomy, PTCA and covered stenting using "kissing stent technique of the iliac bifurcation resulting in an excellent result. The patient  does have a high-grade SMA stenosis which may be responsible for his weight loss and abdominal pain exhibiting to mesenteric ischemia which will be addressed in the future. The sheath will be removed and pressure held once the ACT falls below  170. Patient will be hydrated overnight, treated with dual antiplatelet therapy. Because of his excessive radiation dose which he received because of the complexity of the procedure he will need to be seen in the office in 2 weeks for follow-up. He will need lower extremity arterial Doppler studies in our [place] line  next week.    Robert Schaefer. MD, [facility] 05/29/2016 1:08 PM   Patient Profile     75 yo WM admitted with progressive claudication. Also history of weight loss and abdominal pain. History of moderate AS. Prior myoview negative for ischemia.   Assessment & Plan   1. PAD s/p bilateral iliac stenting yesterday. Also noted to have severe SMA stenosis that will need to be addressed at a later date. On ASA and Plavix. Had high fluoro dose yesterday so will need close follow up in office.  2. Acute delirium. Post vascular procedure. ? Related to sedation. No focal findings. Unable to cooperate for any imaging. Will check baseline labs if possible. Will need sedation with Haldol +/- Ativan. Reorientation.  3. Moderate aortic stenosis. 4. HTN controlled 5. Hyperlipidemia on statin.  Signed, Peter Martinique, MD  05/30/2016, 8:16 AM

## 2016-05-31 ENCOUNTER — Other Ambulatory Visit: Payer: Self-pay | Admitting: Cardiology

## 2016-05-31 DIAGNOSIS — Z79899 Other long term (current) drug therapy: Secondary | ICD-10-CM | POA: Diagnosis not present

## 2016-05-31 DIAGNOSIS — I739 Peripheral vascular disease, unspecified: Secondary | ICD-10-CM

## 2016-05-31 DIAGNOSIS — Z87891 Personal history of nicotine dependence: Secondary | ICD-10-CM | POA: Diagnosis not present

## 2016-05-31 DIAGNOSIS — D649 Anemia, unspecified: Secondary | ICD-10-CM | POA: Diagnosis not present

## 2016-05-31 DIAGNOSIS — Z7982 Long term (current) use of aspirin: Secondary | ICD-10-CM | POA: Diagnosis not present

## 2016-05-31 DIAGNOSIS — I771 Stricture of artery: Secondary | ICD-10-CM | POA: Diagnosis not present

## 2016-05-31 DIAGNOSIS — I70213 Atherosclerosis of native arteries of extremities with intermittent claudication, bilateral legs: Secondary | ICD-10-CM | POA: Diagnosis not present

## 2016-05-31 DIAGNOSIS — Z7902 Long term (current) use of antithrombotics/antiplatelets: Secondary | ICD-10-CM | POA: Diagnosis not present

## 2016-05-31 DIAGNOSIS — Z95820 Peripheral vascular angioplasty status with implants and grafts: Secondary | ICD-10-CM

## 2016-05-31 LAB — CBC
HEMATOCRIT: 25.6 % — AB (ref 39.0–52.0)
Hemoglobin: 8 g/dL — ABNORMAL LOW (ref 13.0–17.0)
MCH: 28.9 pg (ref 26.0–34.0)
MCHC: 31.3 g/dL (ref 30.0–36.0)
MCV: 92.4 fL (ref 78.0–100.0)
PLATELETS: 114 10*3/uL — AB (ref 150–400)
RBC: 2.77 MIL/uL — ABNORMAL LOW (ref 4.22–5.81)
RDW: 19.4 % — AB (ref 11.5–15.5)
WBC: 5.1 10*3/uL (ref 4.0–10.5)

## 2016-05-31 MED ORDER — CLOPIDOGREL BISULFATE 75 MG PO TABS: 75.0000 mg | ORAL_TABLET | Freq: Every day | ORAL | 11 refills | Status: DC

## 2016-05-31 MED ORDER — LOSARTAN POTASSIUM 50 MG PO TABS: 50.0000 mg | ORAL_TABLET | Freq: Every day | ORAL | 5 refills | Status: DC

## 2016-05-31 NOTE — Progress Notes (Signed)
Patient Name: Robert Schaefer Date of Encounter: 05/31/2016  Primary Cardiologist: Dr. Serena Croissant Problem List     Active Problems:   Claudication [facility])   Patient Profile     75 yo WM admitted with progressive claudication. Also history of weight loss and abdominal pain. History of moderate AS. Prior myoview negative for ischemia.    Subjective   Doing ok. He denies any severe groin, flank and low back pain. No CP or dyspnea.   Inpatient Medications    Scheduled Meds: . alfuzosin  10 mg Oral QHS  . aspirin EC  81 mg Oral Daily  . clopidogrel  75 mg Oral Q breakfast  . feeding supplement (ENSURE ENLIVE)  237 mL Oral TID BM  . levothyroxine  50 mcg Oral QAC breakfast  . losartan  50 mg Oral Daily  . simvastatin  7.5 mg Oral QODAY   Continuous Infusions:  PRN Meds: acetaminophen, haloperidol lactate, hydrALAZINE, morphine injection, ondansetron (ZOFRAN) IV   Vital Signs    Vitals:   05/30/16 1303 05/30/16 1338 05/30/16 1913 05/31/16 0235  BP: (!) 101/25 (!) 129/22 (!) 102/32 (!) 112/35  Pulse:  (!) 56 88 85  Resp: (!) 21 19 20 19   Temp:  97.7 F (36.5 C) 97 F (36.1 C) 97.8 F (36.6 C)  TempSrc:  Oral Axillary Axillary  SpO2:   96% 91%  Weight:    157 lb 3 oz (71.3 kg)  Height:        Intake/Output Summary (Last 24 hours) at 05/31/16 0736 Last data filed at 05/30/16 1700  Gross per 24 hour  Intake              120 ml  Output              100 ml  Net               20 ml   Filed Weights   05/29/16 0730 05/30/16 0227 05/31/16 0235  Weight: 153 lb (69.4 kg) 160 lb 4.4 oz (72.7 kg) 157 lb 3 oz (71.3 kg)    Physical Exam   GEN: Well nourished, well developed, in no acute distress.  HEENT: Grossly normal.  Neck: Supple, no JVD, carotid bruits, or masses. Cardiac: RRR, 1/6 AS murmur, no rubs, or gallops. No clubbing, cyanosis, edema.  Decreased DPs bilaterally. Small bilateral femoral hematomas. Soft, slightly tender.  Respiratory:  Respirations  regular and unlabored, clear to auscultation bilaterally. GI: Soft, nontender, nondistended, BS + x 4. MS: no deformity or atrophy. Skin: warm and dry, no rash. Neuro:  Strength and sensation are intact. Psych: AAOx3.  Normal affect.  Labs    CBC  Recent Labs  05/30/16 1510 05/31/16 0445  WBC 4.2 5.1  HGB 7.8* 8.0*  HCT 25.0* 25.6*  MCV 91.9 92.4  PLT 101* 99991111*   Basic Metabolic Panel  Recent Labs  05/30/16 1510  NA 140  K 3.6  CL 112*  CO2 24  GLUCOSE 183*  BUN 15  CREATININE 0.89  CALCIUM 9.0   Liver Function Tests No results for input(s): AST, ALT, ALKPHOS, BILITOT, PROT, ALBUMIN in the last 72 hours. No results for input(s): LIPASE, AMYLASE in the last 72 hours. Cardiac Enzymes No results for input(s): CKTOTAL, CKMB, CKMBINDEX, TROPONINI in the last 72 hours. BNP Invalid input(s): POCBNP D-Dimer No results for input(s): DDIMER in the last 72 hours. Hemoglobin A1C No results for input(s): HGBA1C in the last 72 hours. Fasting Lipid  Panel No results for input(s): CHOL, HDL, LDLCALC, TRIG, CHOLHDL, LDLDIRECT in the last 72 hours. Thyroid Function Tests No results for input(s): TSH, T4TOTAL, T3FREE, THYROIDAB in the last 72 hours.  Invalid input(s): FREET3  Telemetry    Pt refused telemetry overnight.     Radiology    No results found.  Cardiac Studies   PV Angio 05/29/16  Procedures Performed:            1. Abdominal aortogram in the PA and lateral view            2. Bilateral iliac angiogram with bifemoral runoff            3. Diamondback orbital rotational atherectomy of the right and left common iliac arteries            4. PTA and covered stenting of the right and left common iliac arteries Patient Profile     75 yo WM admitted with progressive claudication. Also history of weight loss and abdominal pain. History of moderate AS. Prior myoview negative for ischemia.   Assessment & Plan    1. PAD: s/p bilateral iliac stenting 05/29/16.  Also noted to have severe SMA stenosis that will need to be addressed at a later date. On ASA and Plavix. Had high fluoro dose during procedure so will need close follow up in office. He will need lower extremity arterial Doppler studies at our [facility] office next week.  2. Acute delirium: Post vascular procedure. ? Related to sedation. No focal findings. Unable to cooperate for any imaging. He required sedation with Haldol +/- Ativan. He is now stable and cooperative.   3. Moderate aortic stenosis: mean gradient 27 mmHg by recent echo.  4. HTN:  controlled  5. Hyperlipidemia:  on statin.  6. Anemia: steady drop in Hgb over the last several months from 14.9>> now at 8.0. Hgb pre procedure was 10.6 and dropped to 7.8. But now at 8.0. He will need to f/u with his PCP for anemia w/u. Fe level was low in June at 2. He is not on supplemental Fe. He is on ASA and Plavix for iliac stents.   7. Bilateral Femoral Hematomas: 2/2 LE PV angio. Both left and right groins are soft, slightly tender with small hematomas and ecchymosis. He denies severe groin, flank and low back pain. Hgb trending upward. BP stable. Pt advised no heavy lifting. He will have close outpatient f/u and we can reassess at that time. Ambulate with cardiac rehab prior to discharge to ensure he is stable for d/c.    Signed, Lyda Jester, PA-C  05/31/2016, 7:36 AM

## 2016-05-31 NOTE — Discharge Summary (Signed)
Discharge Summary    Patient ID: Robert Schaefer,  MRN: [id number], DOB/AGE: 10-26-1940 75 y.o.  Admit date: 05/29/2016 Discharge date: 05/31/2016  Primary Care Provider: [facility] Primary Cardiologist: Dr. [name]   Discharge Diagnoses    Active Problems:   Claudication [facility])   PVD (peripheral vascular disease) ([place])   Allergies Allergies  Allergen Reactions  . Penicillins Anaphylaxis and Swelling    Has patient had a PCN reaction causing immediate rash, facial/tongue/throat swelling, SOB or lightheadedness with hypotension: Yes Has patient had a PCN reaction causing severe rash involving mucus membranes or skin necrosis: No Has patient had a PCN reaction that required hospitalization Yes Has patient had a PCN reaction occurring within the last 10 years: No If all of the above answers are "NO", then may proceed with Cephalosporin use.   . Sulfa Antibiotics Anaphylaxis and Swelling  . Versed [Midazolam] Other (See Comments)    Confusion, delerium    Diagnostic Studies/Procedures    PV Angio 05/29/16  Procedures Performed: 1. Abdominal aortogram in the PA and lateral view 2. Bilateral iliac angiogram with bifemoral runoff 3. Diamondback orbital rotational atherectomy of the right and left common iliac arteries 4. PTA and covered stenting of the right and left common iliac arteries   History of Present Illness     75 yo WM admitted with progressive claudication. Also history of weight loss and abdominal pain. History of moderate AS. Prior myoview negative for ischemia.    Hospital Course     Patient presented to [facility] on 05/29/16 for planned PV angio with Dr. [name]. Angiographic findings are outlined above. He underwent successful bilateral iliac stenting. Also noted to have severe SMA stenosis that will need to be addressed at a later date. He was placed on ASA + Plavix. He had high fluoro dose during procedure so  will need close follow up in office. He will need lower extremity arterial Doppler studies at our [facility] officenext week. He also developed bilateral groin hematomas but remained stable. No difficulties ambulating with cardiac rehab. His post cath recovery was also complicated by acute post-procedural delerium, possibly due to versed. He required sedation with Haldol +/- Ativan, PRN. Condition improved. He was also noted to have anemia. Steady drop in Hgb over the last several months from 14.9>> now at 8.0. Hgb pre procedure was 10.6 and dropped to 7.8. But now at 8.0 day of discharge. He was instructed to f/u with his PCP for further management/ monitoring.   He was last seen and examined by Dr. Debara Pickett, who determined he was stable for discharge home. He will have outpatient f/u in 76 week at [facility] + f/u bilateral LE arterial dopplers.   Consultants: none    Discharge Vitals Blood pressure (!) 138/34, pulse 76, temperature 99 F (37.2 C), temperature source Oral, resp. rate 18, height 5\' 5"  (1.651 m), weight 157 lb 3 oz (71.3 kg), SpO2 94 %.  Filed Weights   05/29/16 0730 05/30/16 0227 05/31/16 0235  Weight: 153 lb (69.4 kg) 160 lb 4.4 oz (72.7 kg) 157 lb 3 oz (71.3 kg)    Labs & Radiologic Studies    CBC  Recent Labs  05/30/16 1510 05/31/16 0445  WBC 4.2 5.1  HGB 7.8* 8.0*  HCT 25.0* 25.6*  MCV 91.9 92.4  PLT 101* 99991111*   Basic Metabolic Panel  Recent Labs  05/30/16 1510  NA 140  K 3.6  CL 112*  CO2 24  GLUCOSE 183*  BUN 15  CREATININE 0.89  CALCIUM 9.0   Liver Function Tests No results for input(s): AST, ALT, ALKPHOS, BILITOT, PROT, ALBUMIN in the last 72 hours. No results for input(s): LIPASE, AMYLASE in the last 72 hours. Cardiac Enzymes No results for input(s): CKTOTAL, CKMB, CKMBINDEX, TROPONINI in the last 72 hours. BNP Invalid input(s): POCBNP D-Dimer No results for input(s): DDIMER in the last 72 hours. Hemoglobin A1C No results for input(s): HGBA1C  in the last 72 hours. Fasting Lipid Panel No results for input(s): CHOL, HDL, LDLCALC, TRIG, CHOLHDL, LDLDIRECT in the last 72 hours. Thyroid Function Tests No results for input(s): TSH, T4TOTAL, T3FREE, THYROIDAB in the last 72 hours.  Invalid input(s): FREET3 _____________  No results found. Disposition   Pt is being discharged home today in good condition.  Follow-up Plans & Appointments    Follow-up Information    Quay Burow, MD Follow up.   Specialties:  Cardiology, Radiology Why:  our office will call you with an appointment for office follow-up and with an appointment for repeat ultrasound of both legs.  Contact information: [address] [place] [address] [phone]          Discharge Instructions    Call MD for:  severe uncontrolled pain    Complete by:  As directed    Diet - low sodium heart healthy    Complete by:  As directed    Increase activity slowly    Complete by:  As directed    Lifting restrictions    Complete by:  As directed    No heavy lifting until cleared at hospital follow-up office visit      Discharge Medications   Current Discharge Medication List    START taking these medications   Details  clopidogrel (PLAVIX) 75 MG tablet Take 1 tablet (75 mg total) by mouth daily with breakfast. Qty: 30 tablet, Refills: 11      CONTINUE these medications which have CHANGED   Details  losartan (COZAAR) 50 MG tablet Take 1 tablet (50 mg total) by mouth daily. Qty: 30 tablet, Refills: 5      CONTINUE these medications which have NOT CHANGED   Details  alfuzosin (UROXATRAL) 10 MG 24 hr tablet Take 10 mg by mouth at bedtime.     aspirin EC 81 MG tablet Take 81 mg by mouth daily.    Cholecalciferol (VITAMIN D PO) Take 7,000 Units by mouth daily.    fluticasone (FLONASE) 50 MCG/ACT nasal spray USE TWO SPRAYS IN EACH NOSTRIL EVERY DAY Qty: 16 g, Refills: 3    levothyroxine (SYNTHROID, LEVOTHROID) 50 MCG tablet TAKE  ONE TABLET BY MOUTH ONCE DAILY Qty: 90 tablet, Refills: 1    simvastatin (ZOCOR) 20 MG tablet Take 7 mg by mouth every other day. At night         Outstanding Labs/Studies   Bilateral LE arterial dopplers in 1 week at [facility].   Duration of Discharge Encounter   Greater than 30 minutes including physician time.  Signed, Lyda Jester PA-C 05/31/2016, 9:14 AM

## 2016-05-31 NOTE — Progress Notes (Signed)
Order for bilateral LE arterial dopplers placed for 1 week at [facility].   Robert Schaefer 05/31/2016

## 2016-06-03 ENCOUNTER — Ambulatory Visit: Payer: Self-pay | Admitting: Internal Medicine

## 2016-06-03 ENCOUNTER — Telehealth: Payer: Self-pay | Admitting: Cardiovascular Disease

## 2016-06-03 NOTE — Telephone Encounter (Signed)
New message       Pt had a procedure last thurs. Wife states that patient's right foot is swollen. Please advise

## 2016-06-03 NOTE — Telephone Encounter (Signed)
It is not unusual to have lower extremity swelling after we vascularization of a CTO. He needs to have lower extremity partial Doppler studies soon and then return office visit to see me soon thereafter.

## 2016-06-03 NOTE — Telephone Encounter (Signed)
Message sent to scheduling 

## 2016-06-03 NOTE — Telephone Encounter (Signed)
Spoke to patient, aware of instructions and recommendations. Have advised him to call back if he does not hear from scheduler in 1-2 days so that we can make sure he's put in for dopplers in appropriate timeframe. He voiced thanks and understanding.

## 2016-06-03 NOTE — Telephone Encounter (Signed)
Pt underwent angiography -- stent of right iliac artery on 11/30. Experiencing some residual swelling in his right foot. States it's "barely noticeable", he can only tell by looking at it. No redness, irritation, pain or inflammation at site. He does report occ mild shooting pain in his heel which comes w certain motion. Advised some swelling probably normal due to increased blood flow after revascularization - unsure about heel pain.   I do see note regarding need for patient to have follow up LE arterial dopplers, this is still to be scheduled.   Let me know if anything further advised. Routed to Dr. [name] for recommendations.

## 2016-06-04 ENCOUNTER — Other Ambulatory Visit: Payer: Self-pay | Admitting: Cardiovascular Disease

## 2016-06-04 DIAGNOSIS — I739 Peripheral vascular disease, unspecified: Secondary | ICD-10-CM

## 2016-06-05 ENCOUNTER — Other Ambulatory Visit: Payer: Self-pay | Admitting: Cardiovascular Disease

## 2016-06-05 DIAGNOSIS — I739 Peripheral vascular disease, unspecified: Secondary | ICD-10-CM

## 2016-06-09 ENCOUNTER — Ambulatory Visit (HOSPITAL_COMMUNITY)
Admission: RE | Admit: 2016-06-09 | Discharge: 2016-06-09 | Disposition: A | Discharge status: Home / Self Care | Payer: Medicare Other | Source: Ambulatory Visit | Attending: Cardiology | Admitting: Cardiology

## 2016-06-09 DIAGNOSIS — Z87891 Personal history of nicotine dependence: Secondary | ICD-10-CM | POA: Insufficient documentation

## 2016-06-09 DIAGNOSIS — E1151 Type 2 diabetes mellitus with diabetic peripheral angiopathy without gangrene: Secondary | ICD-10-CM | POA: Diagnosis not present

## 2016-06-09 DIAGNOSIS — J449 Chronic obstructive pulmonary disease, unspecified: Secondary | ICD-10-CM | POA: Diagnosis not present

## 2016-06-09 DIAGNOSIS — I739 Peripheral vascular disease, unspecified: Secondary | ICD-10-CM | POA: Diagnosis not present

## 2016-06-09 DIAGNOSIS — I70203 Unspecified atherosclerosis of native arteries of extremities, bilateral legs: Secondary | ICD-10-CM | POA: Insufficient documentation

## 2016-06-09 DIAGNOSIS — I10 Essential (primary) hypertension: Secondary | ICD-10-CM | POA: Diagnosis not present

## 2016-06-09 DIAGNOSIS — E785 Hyperlipidemia, unspecified: Secondary | ICD-10-CM | POA: Diagnosis not present

## 2016-06-09 DIAGNOSIS — I700 Atherosclerosis of aorta: Secondary | ICD-10-CM | POA: Diagnosis not present

## 2016-06-10 ENCOUNTER — Ambulatory Visit (INDEPENDENT_AMBULATORY_CARE_PROVIDER_SITE_OTHER): Payer: Medicare Other | Admitting: Internal Medicine

## 2016-06-10 VITALS — BP 116/50 | HR 64 | Temp 97.4°F | Resp 16 | Ht 66.0 in | Wt 155.0 lb

## 2016-06-10 DIAGNOSIS — I739 Peripheral vascular disease, unspecified: Secondary | ICD-10-CM

## 2016-06-10 DIAGNOSIS — R911 Solitary pulmonary nodule: Secondary | ICD-10-CM

## 2016-06-10 DIAGNOSIS — Z79899 Other long term (current) drug therapy: Secondary | ICD-10-CM | POA: Diagnosis not present

## 2016-06-10 DIAGNOSIS — I10 Essential (primary) hypertension: Secondary | ICD-10-CM | POA: Diagnosis not present

## 2016-06-10 DIAGNOSIS — D518 Other vitamin B12 deficiency anemias: Secondary | ICD-10-CM | POA: Diagnosis not present

## 2016-06-10 DIAGNOSIS — D508 Other iron deficiency anemias: Secondary | ICD-10-CM | POA: Diagnosis not present

## 2016-06-11 ENCOUNTER — Other Ambulatory Visit: Payer: Self-pay | Admitting: Cardiovascular Disease

## 2016-06-11 DIAGNOSIS — I350 Nonrheumatic aortic (valve) stenosis: Secondary | ICD-10-CM

## 2016-06-11 LAB — CBC WITH DIFFERENTIAL/PLATELET
BASOS ABS: 42 {cells}/uL (ref 0–200)
Basophils Relative: 1 %
EOS ABS: 168 {cells}/uL (ref 15–500)
Eosinophils Relative: 4 %
HCT: 29.1 % — ABNORMAL LOW (ref 38.5–50.0)
HEMOGLOBIN: 9.3 g/dL — AB (ref 13.2–17.1)
Lymphocytes Relative: 23 %
Lymphs Abs: 966 cells/uL (ref 850–3900)
MCH: 29.4 pg (ref 27.0–33.0)
MCHC: 32 g/dL (ref 32.0–36.0)
MCV: 92.1 fL (ref 80.0–100.0)
MONOS PCT: 6 %
MPV: 10.7 fL (ref 7.5–12.5)
Monocytes Absolute: 252 cells/uL (ref 200–950)
NEUTROS ABS: 2772 {cells}/uL (ref 1500–7800)
NEUTROS PCT: 66 %
PLATELETS: 138 10*3/uL — AB (ref 140–400)
RBC: 3.16 MIL/uL — ABNORMAL LOW (ref 4.20–5.80)
RDW: 20.7 % — ABNORMAL HIGH (ref 11.0–15.0)
WBC: 4.2 10*3/uL (ref 3.8–10.8)

## 2016-06-11 LAB — BASIC METABOLIC PANEL WITH GFR
BUN: 9 mg/dL (ref 7–25)
CALCIUM: 9.1 mg/dL (ref 8.6–10.3)
CHLORIDE: 108 mmol/L (ref 98–110)
CO2: 30 mmol/L (ref 20–31)
CREATININE: 0.8 mg/dL (ref 0.70–1.18)
GFR, Est African American: >89 mL/min (ref >=60)
GFR, Est Non African American: 87 mL/min (ref >=60)
Glucose, Bld: 119 mg/dL — ABNORMAL HIGH (ref 65–99)
Potassium: 4.1 mmol/L (ref 3.5–5.3)
Sodium: 141 mmol/L (ref 135–146)

## 2016-06-11 LAB — VITAMIN B12: VITAMIN B 12: 608 pg/mL (ref 200–1100)

## 2016-06-11 LAB — FOLATE RBC: RBC FOLATE: 765 ng/mL (ref >280)

## 2016-06-11 LAB — IRON AND TIBC
%SAT: 16 % (ref 15–60)
Iron: 39 ug/dL — ABNORMAL LOW (ref 50–180)
TIBC: 242 ug/dL — ABNORMAL LOW (ref 250–425)
UIBC: 203 ug/dL (ref 125–400)

## 2016-06-13 ENCOUNTER — Encounter: Payer: Self-pay | Admitting: Cardiology

## 2016-06-13 ENCOUNTER — Ambulatory Visit (INDEPENDENT_AMBULATORY_CARE_PROVIDER_SITE_OTHER): Payer: Medicare Other | Admitting: Cardiology

## 2016-06-13 DIAGNOSIS — I10 Essential (primary) hypertension: Secondary | ICD-10-CM | POA: Diagnosis not present

## 2016-06-13 DIAGNOSIS — R0989 Other specified symptoms and signs involving the circulatory and respiratory systems: Secondary | ICD-10-CM | POA: Diagnosis not present

## 2016-06-13 DIAGNOSIS — I350 Nonrheumatic aortic (valve) stenosis: Secondary | ICD-10-CM

## 2016-06-13 DIAGNOSIS — R079 Chest pain, unspecified: Secondary | ICD-10-CM

## 2016-06-13 DIAGNOSIS — I739 Peripheral vascular disease, unspecified: Secondary | ICD-10-CM

## 2016-06-13 DIAGNOSIS — E782 Mixed hyperlipidemia: Secondary | ICD-10-CM

## 2016-06-13 NOTE — Assessment & Plan Note (Signed)
Aortic stenosis, moderate by echo Nov 2017

## 2016-06-13 NOTE — Assessment & Plan Note (Signed)
Carotid bruits-dopplers showed no significant obstruction

## 2016-06-13 NOTE — Assessment & Plan Note (Signed)
Controlled.  

## 2016-06-13 NOTE — Progress Notes (Signed)
06/13/2016 Robert Schaefer   1940/09/10  [id number]   Primary Physician MCKEOWN,WILLIAM DAVID, MD Primary Cardiologist: Dr [name]  HPI:  75 y/o male referred to Dr [name] in Oct for claudication and chest pain. The pt had widespread PVD on exam. Echo showed normal LVF with moderate AS. Myoview was low risk. LEA dopplers suggested significant LE PVD and an abdominal CT done showed aortoiliac, SMA, and LtRA Ca++. PV angiogram was done which showed bilateral iliac disease, 95% Ca++ proximal SMA disease, the renal arteries were patent. He underwent PTA by Dr [name] with a good final result and the pt is in the office today for follow up. He has noted improvement in his claudication symptoms. His dopplers confirm improved flow. He does have post prandial abdominal pain and has had wgt loss, he is followed by Dr Amedeo Plenty.   Current Outpatient Prescriptions  Medication Sig Dispense Refill  . alfuzosin (UROXATRAL) 10 MG 24 hr tablet Take 10 mg by mouth at bedtime.     [name] aspirin EC 81 MG tablet Take 81 mg by mouth daily.    . Cholecalciferol (VITAMIN D PO) Take 7,000 Units by mouth daily.    . clopidogrel (PLAVIX) 75 MG tablet Take 1 tablet (75 mg total) by mouth daily with breakfast. 30 tablet 11  . fluticasone (FLONASE) 50 MCG/ACT nasal spray USE TWO SPRAYS IN EACH NOSTRIL EVERY DAY (Patient taking differently: USE TWO SPRAYS IN EACH NOSTRIL EVERY DAY AS NEEDED FOR ALLERGIES) 16 g 3  . levothyroxine (SYNTHROID, LEVOTHROID) 50 MCG tablet TAKE ONE TABLET BY MOUTH ONCE DAILY 90 tablet 1  . losartan (COZAAR) 50 MG tablet Take 1 tablet (50 mg total) by mouth daily. 30 tablet 5  . simvastatin (ZOCOR) 20 MG tablet Take 7 mg by mouth every other day. At night     No current facility-administered medications for this visit.     Allergies  Allergen Reactions  . Penicillins Anaphylaxis and Swelling    Has patient had a PCN reaction causing immediate rash, facial/tongue/throat swelling, SOB or lightheadedness  with hypotension: Yes Has patient had a PCN reaction causing severe rash involving mucus membranes or skin necrosis: No Has patient had a PCN reaction that required hospitalization Yes Has patient had a PCN reaction occurring within the last 10 years: No If all of the above answers are "NO", then may proceed with Cephalosporin use.   . Sulfa Antibiotics Anaphylaxis and Swelling  . Versed [Midazolam] Other (See Comments)    Confusion, delerium    Social History   Social History  . Marital status: Married    Spouse name: N/A  . Number of children: N/A  . Years of education: N/A   Occupational History  . Not on file.   Social History Main Topics  . Smoking status: Former Smoker    Packs/day: 1.00    Years: 50.00    Types: Cigarettes    Quit date: 07/05/2005  . Smokeless tobacco: Never Used  . Alcohol use No  . Drug use: No  . Sexual activity: Not on file   Other Topics Concern  . Not on file   Social History Narrative  . No narrative on file     Review of Systems: General: negative for chills, fever, night sweats or weight changes.  Cardiovascular: negative for chest pain, dyspnea on exertion, edema, orthopnea, palpitations, paroxysmal nocturnal dyspnea or shortness of breath Dermatological: negative for rash Respiratory: negative for cough or wheezing Urologic: negative for hematuria Abdominal:  negative for nausea, vomiting, diarrhea, bright red blood per rectum, melena, or hematemesis Neurologic: negative for visual changes, syncope, or dizziness All other systems reviewed and are otherwise negative except as noted above.    Blood pressure (!) 128/58, pulse 80, height 5\' 6"  (1.676 m), weight 155 lb 6.4 oz (70.5 kg), SpO2 98 %.  General appearance: alert, cooperative, appears older than stated age and no distress Neck: no adenopathy, no JVD and bilateral CA bruits Lungs: clear to auscultation bilaterally Heart: regular rate and rhythm and 2/6 systolic  murmur Abdomen: not distended, not tender, RLQ abdominal bruit Extremities: resolving ecchymosis both FA sites with bilateral FA bruits Skin: pale cool dry Neurologic: Grossly normal   ASSESSMENT AND PLAN:   Claudication (HCC) Improved after bilateral iliac PTA 05/29/16  PVD (peripheral vascular disease) (HCC) Pt has residual high grade Ca++ proximal SMA disease  Carotid bruit present Carotid bruits-dopplers showed no significant obstruction  Aortic stenosis, moderate Aortic stenosis, moderate by echo Nov 2017  Essential hypertension Controlled  Hyperlipidemia On low dose statin Rx secondary to tolerance issues  Chest pain with moderate risk of acute coronary syndrome  chest pain- low risk Myoview Nov 2017   PLAN  Pt was seen by Dr [name] and myself today in the office. Dr [name] has asked the pt to f/u with Dr Amedeo Plenty. If Dr Amedeo Plenty feels the pt is in fact having intestinal ischemia that may be addressed with PTA Dr [name] will consider intervention.   Kerin Ransom PA-C 06/13/2016 10:41 AM

## 2016-06-13 NOTE — Assessment & Plan Note (Signed)
Improved after bilateral iliac PTA 05/29/16

## 2016-06-13 NOTE — Assessment & Plan Note (Signed)
Pt has residual high grade Ca++ proximal SMA disease

## 2016-06-13 NOTE — Assessment & Plan Note (Signed)
On low dose statin Rx secondary to tolerance issues

## 2016-06-13 NOTE — Assessment & Plan Note (Addendum)
chest pain- low risk Myoview Nov 2017

## 2016-06-13 NOTE — Patient Instructions (Signed)
Medication Instructions:  NO CHANGES  If you need a refill on your cardiac medications before your next appointment, please call your pharmacy.  Labwork: NONE  Testing/Procedures: FOLLOW UP WITH DR HAYES  Follow-Up: Your physician recommends that you schedule a follow-up appointment in: [place]!    Thank you for choosing [facility]!!

## 2016-06-14 NOTE — Progress Notes (Signed)
Subjective:    Patient ID: Robert Schaefer, male    DOB: 1940/10/24, 75 y.o.   MRN: [id number]  HPI   Patient is a very nice 75 yo [facility] ~ 2 weeks previous for bilat Iliac Aa arthrotomy surgery by Dr [name] on 05/29/16. Patient also hsa Left Renal Aa stenosis and Mesenteric Aa stenosis. Since hospitalization,  patient has done well. He also is noted to have ~ 15# wt loss over the last 2&1/2 months. During hospitalization, he was noted to have a drop in Hgb to 8.0 gm%. He denies any claudication sx's since hospitalization and likewise CV systems review is negative.   Medication Sig  . alfuzosin 10 MG 24 hr Take 10 mg by mouth at bedtime.   [name] aspirin EC 81 MG  Take 81 mg by mouth daily.  [name] VITAMIN D  Take 7,000 Units by mouth daily.  . clopidogrel 75 MG  Take 1 tablet (75 mg total) by mouth daily with breakfast.  . fFLONASE nasal spray  TWO SPRAYS IN EACH NOSTRIL EVERY DAY   . levothyroxine  50 MCG  TAKE ONE TABLET BY MOUTH ONCE DAILY  . losartan  50 MG Take 1 tablet (50 mg total) by mouth daily.  . simvastatin  20 MG  Take 7 mg by mouth every other day. At night   Allergies  Allergen Reactions  . Penicillins Anaphylaxis and Swelling  . Sulfa Antibiotics Anaphylaxis and Swelling  . Versed [Midazolam] Other (See Comments)    Confusion, delerium   Past Medical History:  Diagnosis Date  . Arthritis   . Asthma   . Atherosclerosis of aorta ([place])    via CXR  . COPD (chronic obstructive pulmonary disease) ([place])   . Dysphagia   . GERD (gastroesophageal reflux disease)   . History of hiatal hernia   . Hyperlipidemia   . Hypertension   . IBS (irritable bowel syndrome)   . OSA (obstructive sleep apnea)    has a cpap-canot use it  . Pre-diabetes   . RLS (restless legs syndrome)   . Seasonal allergies   . Thyroid disease   . Wears glasses   . Wears partial dentures    top   Past Surgical History:  Procedure Laterality Date  . CATARACT EXTRACTION W/ INTRAOCULAR LENS  IMPLANT,  BILATERAL Bilateral   . COLONOSCOPY    . ESOPHAGOGASTRODUODENOSCOPY (EGD) WITH ESOPHAGEAL DILATION  "multiple times"  . HERNIA REPAIR    . ILIAC ARTERY STENT Bilateral 05/29/2016  . INGUINAL HERNIA REPAIR Right 02/23/2014   Procedure: HERNIA REPAIR INGUINAL ADULT;  Surgeon: Joyice Faster. Cornett, MD;  Location: [place];  Service: General;  Laterality: Right;  . INSERTION OF MESH Right 02/23/2014   Procedure: INSERTION OF MESH;  Surgeon: Joyice Faster. Cornett, MD;  Location: [place];  Service: General;  Laterality: Right;  . PERIPHERAL VASCULAR CATHETERIZATION N/A 05/29/2016   Procedure: Lower Extremity Angiography;  Surgeon: [name], MD;  Location: [place] [facility];  Service: Cardiovascular;  Laterality: N/A;  . PILONIDAL CYST / SINUS EXCISION  2000   spine  . UPPER GI ENDOSCOPY     Review of Systems  10 point systems review negative except as above.    Objective:   Physical Exam  BP (!) 116/50   Pulse 64   Temp 97.4 F (36.3 C)   Resp 16   Ht 5\' 6"  (1.676 m)   Wt 155 lb (70.3 kg)   BMI 25.02  kg/m   HEENT - Eac's patent. TM's Nl. EOM's full. PERRLA. NasoOroPharynx clear. Neck - supple. Nl Thyroid. Carotids 2+ & No bruits, nodes, JVD Chest - Clear equal BS w/o Rales, rhonchi, wheezes. Cor - Nl HS. RRR w/o sig MGR. PP 1(+). No edema. Abd - No palpable organomegaly, masses or tenderness. BS nl. MS- FROM w/o deformities. Muscle power, tone and bulk Nl. Gait Nl. Neuro - No obvious Cr N abnormalities. Sensory, motor and Cerebellar functions appear Nl w/o focal abnormalities. Psyche - Mental status normal & appropriate.  No delusions, ideations or obvious mood abnormalities.    Assessment & Plan:   1. Essential hypertension   2. PVD (peripheral vascular disease) with claudication (HCC)   3. Other iron deficiency anemia  - CBC with Differential/Platelet - Iron and TIBC  4. Other vitamin B12 deficiency anemia  - CBC with  Differential/Platelet - Folate RBC - Vitamin B12  5. Medication management  - CBC with Differential/Platelet - BASIC METABOLIC PANEL WITH GFR  6. Nodule of left lung  - CT Chest W Contrast; Future

## 2016-06-17 ENCOUNTER — Encounter (HOSPITAL_COMMUNITY): Payer: Medicare Other

## 2016-06-18 ENCOUNTER — Ambulatory Visit (HOSPITAL_COMMUNITY)
Admission: RE | Admit: 2016-06-18 | Discharge: 2016-06-18 | Disposition: A | Discharge status: Home / Self Care | Payer: Medicare Other | Source: Ambulatory Visit | Attending: Internal Medicine | Admitting: Internal Medicine

## 2016-06-18 ENCOUNTER — Other Ambulatory Visit: Payer: Self-pay | Admitting: Internal Medicine

## 2016-06-18 ENCOUNTER — Encounter (HOSPITAL_COMMUNITY): Payer: Self-pay

## 2016-06-18 DIAGNOSIS — I700 Atherosclerosis of aorta: Secondary | ICD-10-CM | POA: Diagnosis not present

## 2016-06-18 DIAGNOSIS — K449 Diaphragmatic hernia without obstruction or gangrene: Secondary | ICD-10-CM | POA: Insufficient documentation

## 2016-06-18 DIAGNOSIS — R918 Other nonspecific abnormal finding of lung field: Secondary | ICD-10-CM | POA: Diagnosis not present

## 2016-06-18 DIAGNOSIS — R911 Solitary pulmonary nodule: Secondary | ICD-10-CM | POA: Diagnosis not present

## 2016-06-18 DIAGNOSIS — K229 Disease of esophagus, unspecified: Secondary | ICD-10-CM | POA: Diagnosis not present

## 2016-06-18 DIAGNOSIS — I2510 Atherosclerotic heart disease of native coronary artery without angina pectoris: Secondary | ICD-10-CM | POA: Insufficient documentation

## 2016-06-18 MED ORDER — IOPAMIDOL (ISOVUE-300) INJECTION 61%
INTRAVENOUS | Status: AC
  Administered 2016-06-18: 75 mL via INTRAVENOUS
  Filled 2016-06-18: qty 75

## 2016-06-18 MED ORDER — IOPAMIDOL (ISOVUE-300) INJECTION 61%
75.0000 mL | Freq: Once | INTRAVENOUS | Status: AC | PRN
  Administered 2016-06-18: 75 mL via INTRAVENOUS

## 2016-06-30 ENCOUNTER — Other Ambulatory Visit: Payer: Self-pay | Admitting: Internal Medicine

## 2016-08-06 ENCOUNTER — Other Ambulatory Visit (HOSPITAL_COMMUNITY): Payer: Self-pay | Admitting: Urology

## 2016-08-06 DIAGNOSIS — R351 Nocturia: Secondary | ICD-10-CM | POA: Diagnosis not present

## 2016-08-06 DIAGNOSIS — R972 Elevated prostate specific antigen [PSA]: Secondary | ICD-10-CM

## 2016-08-06 DIAGNOSIS — N401 Enlarged prostate with lower urinary tract symptoms: Secondary | ICD-10-CM | POA: Diagnosis not present

## 2016-08-12 ENCOUNTER — Encounter: Payer: Self-pay | Admitting: Pulmonary Disease

## 2016-08-12 ENCOUNTER — Ambulatory Visit (INDEPENDENT_AMBULATORY_CARE_PROVIDER_SITE_OTHER): Payer: Medicare Other | Admitting: Pulmonary Disease

## 2016-08-12 DIAGNOSIS — J984 Other disorders of lung: Secondary | ICD-10-CM | POA: Insufficient documentation

## 2016-08-12 DIAGNOSIS — J449 Chronic obstructive pulmonary disease, unspecified: Secondary | ICD-10-CM | POA: Diagnosis not present

## 2016-08-12 NOTE — Assessment & Plan Note (Signed)
Obtain PFTs to clarify

## 2016-08-12 NOTE — Patient Instructions (Signed)
You have a cavitating lesion in the left upper lung which may be aspiration pneumonia or cancer given year-long history of smoking  Proceed with PET scan to see if we can tell the difference Schedule for PFTs  We may need a biopsy in the future  Obtain records from Dr. Amedeo Plenty

## 2016-08-12 NOTE — Progress Notes (Signed)
Subjective:    Patient ID: Robert Schaefer, male    DOB: 12-18-1940, 76 y.o.   MRN: [id number]  HPI Chief Complaint  Patient presents with  . Pulm Consult    Referred by Dr. Melford Aase for nodule on left lung. Pt denies any breathing issues.     76 year old heavy ex-smoker presents for evaluation of lesion in left lung noted on imaging. He smoked more than 50 pack years before he quit in 2007. He used to work in [employer]. He reports dysphagia to solids and liquids for many years and had dilatation procedure by Dr. Amedeo Plenty. He is lost 30 pounds over the last 6 months but has now started gaining a few pounds back. He developed a cough and underwent chest x-ray 02/2016. CT chest 03/27/16 showed 2.51.6 cm nodule in the left upper lobe. There was calcified mediastinal and hilar lymph nodes and esophagus appeared dilated with fluid. Follow-up CT scan on 05/2016 showed interval cavitation of this left upper lobe lesion with increase in size to 4.42.9 cm. The esophagus again appeared dilated with a moderate hiatal hernia  Again he denies dyspnea, wheezing, edema or hemoptysis.[name] He denies subjective fevers or does not clearly give history of an aspiration episode. He has PAD and stents in his legs and takes Plavix daily     Past Medical History:  Diagnosis Date  . Arthritis   . Asthma   . Atherosclerosis of aorta ([place])    via CXR  . COPD (chronic obstructive pulmonary disease) ([place])   . Dysphagia   . GERD (gastroesophageal reflux disease)   . History of hiatal hernia   . Hyperlipidemia   . Hypertension   . IBS (irritable bowel syndrome)   . OSA (obstructive sleep apnea)    has a cpap-canot use it  . Pre-diabetes   . RLS (restless legs syndrome)   . Seasonal allergies   . Thyroid disease   . Wears glasses   . Wears partial dentures    top    Past Surgical History:  Procedure Laterality Date  . CATARACT EXTRACTION W/ INTRAOCULAR LENS  IMPLANT, BILATERAL Bilateral   .  COLONOSCOPY    . ESOPHAGOGASTRODUODENOSCOPY (EGD) WITH ESOPHAGEAL DILATION  "multiple times"  . HERNIA REPAIR    . ILIAC ARTERY STENT Bilateral 05/29/2016  . INGUINAL HERNIA REPAIR Right 02/23/2014   Procedure: HERNIA REPAIR INGUINAL ADULT;  Surgeon: Joyice Faster. Cornett, MD;  Location: [place];  Service: General;  Laterality: Right;  . INSERTION OF MESH Right 02/23/2014   Procedure: INSERTION OF MESH;  Surgeon: Joyice Faster. Cornett, MD;  Location: [place];  Service: General;  Laterality: Right;  . PERIPHERAL VASCULAR CATHETERIZATION N/A 05/29/2016   Procedure: Lower Extremity Angiography;  Surgeon: [name], MD;  Location: [place] [facility];  Service: Cardiovascular;  Laterality: N/A;  . PILONIDAL CYST / SINUS EXCISION  2000   spine  . UPPER GI ENDOSCOPY       Allergies  Allergen Reactions  . Penicillins Anaphylaxis and Swelling  . Sulfa Antibiotics Anaphylaxis and Swelling  . Versed [Midazolam] Other (See Comments)    Confusion, delerium     Social History   Social History  . Marital status: Married    Spouse name: N/A  . Number of children: N/A  . Years of education: N/A   Occupational History  . Not on file.   Social History Main Topics  . Smoking status: Former Smoker    Packs/day:  1.00    Years: 50.00    Types: Cigarettes    Quit date: 07/05/2005  . Smokeless tobacco: Never Used  . Alcohol use No  . Drug use: No  . Sexual activity: Not on file   Other Topics Concern  . Not on file   Social History Narrative  . No narrative on file     Family History  Problem Relation Age of Onset  . Heart attack Mother   . Stroke Father   . Asthma Father      Review of Systems Constitutional: negative for anorexia, fevers and sweats  Eyes: negative for irritation, redness and visual disturbance  Ears, nose, mouth, throat, and face: negative for earaches, epistaxis, nasal congestion and sore throat  Respiratory: negative for  cough, dyspnea on exertion, sputum and wheezing  Cardiovascular: negative for chest pain, dyspnea, lower extremity edema, orthopnea, palpitations and syncope  Gastrointestinal: negative for abdominal pain, constipation, diarrhea, melena, nausea and vomiting  Genitourinary:negative for dysuria, frequency and hematuria  Hematologic/lymphatic: negative for bleeding, easy bruising and lymphadenopathy  Musculoskeletal:negative for arthralgias, muscle weakness and stiff joints  Neurological: negative for coordination problems, gait problems, headaches and weakness  Endocrine: negative for diabetic symptoms including polydipsia, polyuria and weight loss     Objective:   Physical Exam  Gen. Pleasant, well-nourished, in no distress, normal affect ENT - no lesions, no post nasal drip Neck: No JVD, no thyromegaly, no carotid bruits Lungs: no use of accessory muscles, no dullness to percussion, bibasal rales,no rhonchi  Cardiovascular: Rhythm regular, heart sounds  normal, no murmurs or gallops, no peripheral edema Abdomen: soft and non-tender, no hepatosplenomegaly, BS normal. Musculoskeletal: No deformities, no cyanosis or clubbing Neuro:  alert, non focal       Assessment & Plan:

## 2016-08-12 NOTE — Assessment & Plan Note (Addendum)
have a cavitating lesion in the left upper lung which may be aspiration pneumonia or cancer given long history of smoking. Of note this was not seen on chest x-ray 10/2015 and noted first on CT scan 02/2016  Proceed with PET scan to see if we can tell the difference Schedule for PFTs  We may need a biopsy in the future  Obtain records from Dr. Amedeo Plenty

## 2016-08-15 ENCOUNTER — Ambulatory Visit (INDEPENDENT_AMBULATORY_CARE_PROVIDER_SITE_OTHER): Payer: Medicare Other | Admitting: Pulmonary Disease

## 2016-08-15 DIAGNOSIS — J984 Other disorders of lung: Secondary | ICD-10-CM

## 2016-08-15 LAB — PULMONARY FUNCTION TEST
DL/VA % pred: 83 %
DL/VA: 3.54 ml/min/mmHg/L
DLCO COR % PRED: 62 %
DLCO UNC: 15.55 ml/min/mmHg
DLCO cor: 16.07 ml/min/mmHg
DLCO unc % pred: 60 %
FEF 25-75 POST: 0.79 L/s
FEF 25-75 Pre: 0.75 L/sec
FEF2575-%Change-Post: 4 %
FEF2575-%PRED-POST: 46 %
FEF2575-%Pred-Pre: 44 %
FEV1-%CHANGE-POST: 0 %
FEV1-%PRED-POST: 66 %
FEV1-%Pred-Pre: 66 %
FEV1-PRE: 1.58 L
FEV1-Post: 1.6 L
FEV1FVC-%Change-Post: -1 %
FEV1FVC-%Pred-Pre: 85 %
FEV6-%Change-Post: 1 %
FEV6-%PRED-POST: 81 %
FEV6-%Pred-Pre: 80 %
FEV6-POST: 2.55 L
FEV6-PRE: 2.52 L
FEV6FVC-%CHANGE-POST: -1 %
FEV6FVC-%PRED-POST: 104 %
FEV6FVC-%PRED-PRE: 105 %
FVC-%CHANGE-POST: 2 %
FVC-%PRED-POST: 78 %
FVC-%PRED-PRE: 76 %
FVC-POST: 2.63 L
FVC-PRE: 2.57 L
POST FEV1/FVC RATIO: 61 %
PRE FEV6/FVC RATIO: 99 %
Post FEV6/FVC ratio: 97 %
Pre FEV1/FVC ratio: 62 %
RV % PRED: 153 %
RV: 3.55 L
TLC % pred: 101 %
TLC: 6.15 L

## 2016-08-15 NOTE — Progress Notes (Signed)
PFT done today. Tinsley Lomas,CMA  

## 2016-08-18 ENCOUNTER — Ambulatory Visit (HOSPITAL_COMMUNITY)
Admission: RE | Admit: 2016-08-18 | Discharge: 2016-08-18 | Disposition: A | Discharge status: Home / Self Care | Payer: Medicare Other | Source: Ambulatory Visit | Attending: Urology | Admitting: Urology

## 2016-08-18 DIAGNOSIS — R972 Elevated prostate specific antigen [PSA]: Secondary | ICD-10-CM | POA: Diagnosis present

## 2016-08-18 DIAGNOSIS — N429 Disorder of prostate, unspecified: Secondary | ICD-10-CM | POA: Diagnosis not present

## 2016-08-18 LAB — POCT I-STAT CREATININE: CREATININE: 0.9 mg/dL (ref 0.61–1.24)

## 2016-08-18 MED ORDER — LIDOCAINE HCL 2 % EX GEL
CUTANEOUS | Status: AC
  Filled 2016-08-18: qty 30

## 2016-08-18 MED ORDER — LIDOCAINE HCL 2 % EX GEL: 1.0000 "application " | Freq: Once | CUTANEOUS | Status: DC

## 2016-08-18 MED ORDER — GADOBENATE DIMEGLUMINE 529 MG/ML IV SOLN
15.0000 mL | Freq: Once | INTRAVENOUS | Status: AC | PRN
  Administered 2016-08-18: 14 mL via INTRAVENOUS

## 2016-08-19 ENCOUNTER — Encounter (HOSPITAL_COMMUNITY)
Admission: RE | Admit: 2016-08-19 | Discharge: 2016-08-19 | Disposition: A | Discharge status: Home / Self Care | Payer: Medicare Other | Source: Ambulatory Visit | Attending: Pulmonary Disease | Admitting: Pulmonary Disease

## 2016-08-19 DIAGNOSIS — J984 Other disorders of lung: Secondary | ICD-10-CM | POA: Insufficient documentation

## 2016-08-19 DIAGNOSIS — R918 Other nonspecific abnormal finding of lung field: Secondary | ICD-10-CM | POA: Diagnosis not present

## 2016-08-19 LAB — GLUCOSE, CAPILLARY: GLUCOSE-CAPILLARY: 97 mg/dL (ref 65–99)

## 2016-08-19 MED ORDER — FLUDEOXYGLUCOSE F - 18 (FDG) INJECTION
8.7100 | Freq: Once | INTRAVENOUS | Status: AC | PRN
  Administered 2016-08-19: 8.71 via INTRAVENOUS

## 2016-08-22 ENCOUNTER — Telehealth: Payer: Self-pay | Admitting: Pulmonary Disease

## 2016-08-22 NOTE — Telephone Encounter (Signed)
Called and spoke to pt. Pt is requesting the results of PFT. Per last OV (OV on 2.13.18)with RA pt was to have OV in 2 weeks, this was not scheduled. Appt made for pt with TP on 2.27.18. Pt verbalized understanding and denied any further questions or concerns at this time.   Will send to RA as FYI.

## 2016-08-25 NOTE — Telephone Encounter (Signed)
Please make appointment with me next Wednesday at 66

## 2016-08-25 NOTE — Telephone Encounter (Signed)
Called spoke with patient Appt cancelled with TP tomorrow and scheduled for 3.7.18 at 12N Pt okay with this Will sign off

## 2016-08-26 ENCOUNTER — Ambulatory Visit: Payer: Medicare Other | Admitting: Adult Health

## 2016-09-03 ENCOUNTER — Telehealth: Payer: Self-pay | Admitting: Cardiovascular Disease

## 2016-09-03 ENCOUNTER — Ambulatory Visit (INDEPENDENT_AMBULATORY_CARE_PROVIDER_SITE_OTHER): Payer: Medicare Other | Admitting: Pulmonary Disease

## 2016-09-03 ENCOUNTER — Encounter: Payer: Self-pay | Admitting: Pulmonary Disease

## 2016-09-03 DIAGNOSIS — J984 Other disorders of lung: Secondary | ICD-10-CM

## 2016-09-03 DIAGNOSIS — J449 Chronic obstructive pulmonary disease, unspecified: Secondary | ICD-10-CM | POA: Diagnosis not present

## 2016-09-03 DIAGNOSIS — K449 Diaphragmatic hernia without obstruction or gangrene: Secondary | ICD-10-CM | POA: Insufficient documentation

## 2016-09-03 NOTE — Patient Instructions (Signed)
You have a cavitary lesion in the left upper lung that has decreased in size compared to December 2017-this likely represents aspiration pneumonia due to your hiatal hernia  Follow-up CT chest without contrast in 4 months  You have COPD related to smoking-lung capacity is at 66%

## 2016-09-03 NOTE — Assessment & Plan Note (Signed)
cavitary lesion in the left upper lung that has decreased in size compared to December 2017-this likely represents aspiration pneumonia due to your hiatal hernia  Follow-up CT chest without contrast in 4 months

## 2016-09-03 NOTE — Assessment & Plan Note (Signed)
No meds required Discussed pulmonary rehabilitation-but he would like to defer

## 2016-09-03 NOTE — Telephone Encounter (Signed)
[facility] Surgery to clarify-  Procedure: MRI Fusion Date: 09/22/16 Doctor doing procedure: Dr. [name] Medications requesting to be held: Plavix for 7 days and ASA for 5 days   Forwarded to Dr. [name] for review.

## 2016-09-03 NOTE — Progress Notes (Signed)
   Subjective:    Patient ID: Robert Schaefer, male    DOB: July 08, 1940, 76 y.o.   MRN: [id number]  HPI  76 year old heavy ex-smoker for FU of cavitating lesion in left lung noted on imaging.  He smoked more than 50 pack years before he quit in 2007.  He reports dysphagia to solids and liquids for many years and had dilatation procedure by Dr. Amedeo Plenty.  He is here today to discuss PFTs and follow-up after PET scan. I personally reviewed images with him which shows decreasing left upper lobe cavity size. FEV1 was decreased to 66% but he denies cough, dyspnea or wheezing He unfortunately has lesions of his prostate which are being evaluated by the urologist  He denies dysphagia or reflux symptoms currently, he is follow-up with [facility] GIAmedeo Plenty.  Significant tests/ events   CT chest 03/27/16 showed 2.51.6 cm nodule in the left upper lobe. There was calcified mediastinal and hilar lymph nodes and esophagus appeared dilated with fluid. Follow-up CT scan on 05/2016 showed interval cavitation of this left upper lobe lesion with increase in size to 4.42.9 cm. The esophagus again appeared dilated with a moderate hiatal hernia  PFTs 07/2016 showed ratio 62, FEV1 66%, FVC 76% with DLCO 60% and preserved TLC at 101%  PET scan 07/2016 decreased size of cavity, mild hypermetabolism, lower esophageal diverticulum    Review of Systems neg for any significant sore throat, dysphagia, itching, sneezing, nasal congestion or excess/ purulent secretions, fever, chills, sweats, unintended wt loss, pleuritic or exertional cp, hempoptysis, orthopnea pnd or change in chronic leg swelling. Also denies presyncope, palpitations, heartburn, abdominal pain, nausea, vomiting, diarrhea or change in bowel or urinary habits, dysuria,hematuria, rash, arthralgias, visual complaints, headache, numbness weakness or ataxia.     Objective:   Physical Exam   Gen. Pleasant, well-nourished, in no distress ENT - no lesions, no  post nasal drip Neck: No JVD, no thyromegaly, no carotid bruits Lungs: no use of accessory muscles, no dullness to percussion, clear without rales or rhonchi  Cardiovascular: Rhythm regular, heart sounds  normal, no murmurs or gallops, no peripheral edema Musculoskeletal: No deformities, no cyanosis or clubbing         Assessment & Plan:

## 2016-09-03 NOTE — Telephone Encounter (Signed)
Patient is calling states that he is scheduled to have MRI on 09-22-16 and would like to know Dr. [name] would give clearance for him to suspend his Plavix and Aspirin medication for 7 days prior to MRI. Please contact Dr. Gaynelle Arabian Office , contact person is Maudie Mercury at [phone]. Thanks.

## 2016-09-03 NOTE — Assessment & Plan Note (Signed)
We discussed options including surgery. He would like to hold off for now

## 2016-09-04 NOTE — Telephone Encounter (Signed)
OK to interrupt anti platelet Rx 

## 2016-09-04 NOTE — Telephone Encounter (Signed)
Faxed via Epic to Dr. Gaynelle Arabian.

## 2016-09-08 ENCOUNTER — Ambulatory Visit: Payer: Self-pay | Admitting: Internal Medicine

## 2016-09-15 ENCOUNTER — Telehealth: Payer: Self-pay | Admitting: Pulmonary Disease

## 2016-09-15 ENCOUNTER — Encounter: Payer: Self-pay | Admitting: Internal Medicine

## 2016-09-15 ENCOUNTER — Ambulatory Visit (INDEPENDENT_AMBULATORY_CARE_PROVIDER_SITE_OTHER): Payer: Medicare Other | Admitting: Internal Medicine

## 2016-09-15 VITALS — BP 144/58 | HR 72 | Temp 97.5°F | Resp 16 | Ht 66.0 in | Wt 156.4 lb

## 2016-09-15 DIAGNOSIS — E559 Vitamin D deficiency, unspecified: Secondary | ICD-10-CM

## 2016-09-15 DIAGNOSIS — Z79899 Other long term (current) drug therapy: Secondary | ICD-10-CM

## 2016-09-15 DIAGNOSIS — R911 Solitary pulmonary nodule: Secondary | ICD-10-CM | POA: Diagnosis not present

## 2016-09-15 DIAGNOSIS — R7303 Prediabetes: Secondary | ICD-10-CM

## 2016-09-15 DIAGNOSIS — E782 Mixed hyperlipidemia: Secondary | ICD-10-CM

## 2016-09-15 DIAGNOSIS — E039 Hypothyroidism, unspecified: Secondary | ICD-10-CM

## 2016-09-15 DIAGNOSIS — I700 Atherosclerosis of aorta: Secondary | ICD-10-CM | POA: Diagnosis not present

## 2016-09-15 DIAGNOSIS — I10 Essential (primary) hypertension: Secondary | ICD-10-CM | POA: Diagnosis not present

## 2016-09-15 LAB — CBC WITH DIFFERENTIAL/PLATELET
BASOS ABS: 46 {cells}/uL (ref 0–200)
Basophils Relative: 1 %
EOS ABS: 184 {cells}/uL (ref 15–500)
EOS PCT: 4 %
HCT: 37.3 % — ABNORMAL LOW (ref 38.5–50.0)
HEMOGLOBIN: 12.8 g/dL — AB (ref 13.2–17.1)
LYMPHS ABS: 1058 {cells}/uL (ref 850–3900)
Lymphocytes Relative: 23 %
MCH: 31.6 pg (ref 27.0–33.0)
MCHC: 34.3 g/dL (ref 32.0–36.0)
MCV: 92.1 fL (ref 80.0–100.0)
MPV: 9.8 fL (ref 7.5–12.5)
Monocytes Absolute: 322 cells/uL (ref 200–950)
Monocytes Relative: 7 %
NEUTROS ABS: 2990 {cells}/uL (ref 1500–7800)
Neutrophils Relative %: 65 %
Platelets: 134 10*3/uL — ABNORMAL LOW (ref 140–400)
RBC: 4.05 MIL/uL — ABNORMAL LOW (ref 4.20–5.80)
RDW: 14.9 % (ref 11.0–15.0)
WBC: 4.6 10*3/uL (ref 3.8–10.8)

## 2016-09-15 LAB — BASIC METABOLIC PANEL WITH GFR
BUN: 14 mg/dL (ref 7–25)
CALCIUM: 9.7 mg/dL (ref 8.6–10.3)
CO2: 26 mmol/L (ref 20–31)
Chloride: 108 mmol/L (ref 98–110)
Creat: 1.03 mg/dL (ref 0.70–1.18)
GFR, Est African American: 81 mL/min (ref >=60)
GFR, Est Non African American: 70 mL/min (ref >=60)
GLUCOSE: 125 mg/dL — AB (ref 65–99)
Potassium: 4 mmol/L (ref 3.5–5.3)
SODIUM: 143 mmol/L (ref 135–146)

## 2016-09-15 LAB — LIPID PANEL
CHOLESTEROL: 111 mg/dL (ref <200)
HDL: 40 mg/dL — ABNORMAL LOW (ref >40)
LDL Cholesterol: 52 mg/dL (ref <100)
Total CHOL/HDL Ratio: 2.8 Ratio (ref <5.0)
Triglycerides: 96 mg/dL (ref <150)
VLDL: 19 mg/dL (ref <30)

## 2016-09-15 LAB — HEPATIC FUNCTION PANEL
ALBUMIN: 3.8 g/dL (ref 3.6–5.1)
ALT: 9 U/L (ref 9–46)
AST: 13 U/L (ref 10–35)
Alkaline Phosphatase: 51 U/L (ref 40–115)
Bilirubin, Direct: 0.2 mg/dL (ref <=0.2)
Indirect Bilirubin: 0.7 mg/dL (ref 0.2–1.2)
TOTAL PROTEIN: 6.3 g/dL (ref 6.1–8.1)
Total Bilirubin: 0.9 mg/dL (ref 0.2–1.2)

## 2016-09-15 LAB — TSH: TSH: 2.95 mIU/L (ref 0.40–4.50)

## 2016-09-15 MED ORDER — FLUTICASONE PROPIONATE 50 MCG/ACT NA SUSP: 2.0000 | Freq: Every day | NASAL | 3 refills | Status: DC

## 2016-09-15 NOTE — Patient Instructions (Signed)

## 2016-09-15 NOTE — Progress Notes (Signed)
This very nice 76 y.o. WWM presents for 3 month follow up with Hypertension, Hyperlipidemia, Pre-Diabetes, GERD, Hypothyroidism  and Vitamin D Deficiency. Patient is also being followed by Dr [name] for a LUL cavitary lesion slowly decreasing in size &  speculated infectious due to aspiration. Patient also has OSA/on CPAP.      Patient is treated for HTN  (1995) & BP has been controlled at home. Today's BP is sl elevated at 144/58.  Patient has ASPVD and is s/p bilat Aortoiliac arthrotomy (Nov 2017)  by Dr [name] and patient also is known to have L Renal Aa stenosis and Mesenteric Aa stenosis, Patient has had no complaints of any cardiac type chest pain, palpitations, dyspnea/orthopnea/PND, dizziness, claudication, or dependent edema.     Hyperlipidemia is controlled with diet & meds. Patient denies myalgias or other med SE's. Currently , patient is only taking ~1/4 tab (Zocor 20 g) every 2 -3 days and expresses desire to try w/o the medicine.  Last Lipids were at goal: Lab Results  Component Value Date   CHOL 126 03/21/2016   HDL 41 03/21/2016   LDLCALC 70 03/21/2016   TRIG 75 03/21/2016   CHOLHDL 3.1 03/21/2016      Also, the patient has history of PreDiabetes (A1c 6.1% in 2012 and then 5.6% in 2016) and has had no symptoms of reactive hypoglycemia, diabetic polys, paresthesias or visual blurring.  Last A1c was at goal: Lab Results  Component Value Date   HGBA1C 5.2 03/21/2016      Also, patient has been on Thyroid replacement since 2014. Further, the patient also has history of Vitamin D Deficiency and supplements vitamin D without any suspected side-effects. Last vitamin D was at goal: Lab Results  Component Value Date   VD25OH 61 03/21/2016   Current Outpatient Prescriptions on File Prior to Visit  Medication Sig  . alfuzosin (UROXATRAL) 10 MG 24 hr tablet Take 10 mg by mouth at bedtime.   [name] aspirin EC 81 MG tablet Take 81 mg by mouth daily.  . Cholecalciferol (VITAMIN D PO) Take  7,000 Units by mouth daily.  . clopidogrel (PLAVIX) 75 MG tablet Take 1 tablet (75 mg total) by mouth daily with breakfast.  . fluticasone (FLONASE) 50 MCG/ACT nasal spray USE TWO SPRAYS IN EACH NOSTRIL EVERY DAY (Patient taking differently: USE TWO SPRAYS IN EACH NOSTRIL EVERY DAY AS NEEDED FOR ALLERGIES)  . levothyroxine (SYNTHROID, LEVOTHROID) 50 MCG tablet TAKE ONE TABLET BY MOUTH ONCE DAILY  . losartan (COZAAR) 50 MG tablet Take 1 tablet (50 mg total) by mouth daily.  . simvastatin (ZOCOR) 20 MG tablet Take 7 mg by mouth every other day. At night   No current facility-administered medications on file prior to visit.    Allergies  Allergen Reactions  . Penicillins Anaphylaxis and Swelling  . Sulfa Antibiotics Anaphylaxis and Swelling  . Versed [Midazolam] Other (See Comments)    Confusion, delerium   PMHx:   Past Medical History:  Diagnosis Date  . Asthma   . Atherosclerosis of aorta ([place])    via CXR  . Hyperlipidemia   . IBS (irritable bowel syndrome)   . OSA (obstructive sleep apnea)    has a cpap-canot use it  . RLS (restless legs syndrome)    Immunization History  Administered Date(s) Administered  . DT 09/22/2013  . Influenza, High Dose Seasonal PF 04/11/2014, 04/20/2015  . Zoster 06/30/2005   Past Surgical History:  Procedure Laterality Date  .  CATARACT EXTRACTION W/ INTRAOCULAR LENS  IMPLANT, BILATERAL Bilateral   . COLONOSCOPY    . ESOPHAGOGASTRODUODENOSCOPY (EGD) WITH ESOPHAGEAL DILATION  "multiple times"  . HERNIA REPAIR    . ILIAC ARTERY STENT Bilateral 05/29/2016  . INGUINAL HERNIA REPAIR Right 02/23/2014   Procedure: HERNIA REPAIR INGUINAL ADULT;  Surgeon: Joyice Faster. Cornett, MD;  Location: [facility];  Service: General;  Laterality: Right;  . INSERTION OF MESH Right 02/23/2014   Procedure: INSERTION OF MESH;  Surgeon: Joyice Faster. Cornett, MD;  Location: [facility];  Service: General;  Laterality: Right;  . PERIPHERAL VASCULAR  CATHETERIZATION N/A 05/29/2016   Procedure: Lower Extremity Angiography;  Surgeon: [name], MD;  Location: [facility];  Service: Cardiovascular;  Laterality: N/A;  . PILONIDAL CYST / SINUS EXCISION  2000   spine  . UPPER GI ENDOSCOPY     FHx:    Reviewed / unchanged  SHx:    Reviewed / unchanged  Systems Review:  Constitutional: Denies fever, chills, wt changes, headaches, insomnia, fatigue, night sweats, change in appetite. Eyes: Denies redness, blurred vision, diplopia, discharge, itchy, watery eyes.  ENT: Denies discharge, congestion, post nasal drip, epistaxis, sore throat, earache, hearing loss, dental pain, tinnitus, vertigo, sinus pain, snoring.  CV: Denies chest pain, palpitations, irregular heartbeat, syncope, dyspnea, diaphoresis, orthopnea, PND, claudication or edema. Respiratory: denies cough, dyspnea, DOE, pleurisy, hoarseness, laryngitis, wheezing.  Gastrointestinal: Denies dysphagia, odynophagia, heartburn, reflux, water brash, abdominal pain or cramps, nausea, vomiting, bloating, diarrhea, constipation, hematemesis, melena, hematochezia  or hemorrhoids. Genitourinary: Denies dysuria, frequency, urgency, nocturia, hesitancy, discharge, hematuria or flank pain. Musculoskeletal: Denies arthralgias, myalgias, stiffness, jt. swelling, pain, limping or strain/sprain.  Skin: Denies pruritus, rash, hives, warts, acne, eczema or change in skin lesion(s). Neuro: No weakness, tremor, incoordination, spasms, paresthesia or pain. Psychiatric: Denies confusion, memory loss or sensory loss. Endo: Denies change in weight, skin or hair change.  Heme/Lymph: No excessive bleeding, bruising or enlarged lymph nodes.  Physical Exam  BP (!) 144/58   Pulse 72   Temp 97.5 F (36.4 C)   Resp 16   Ht 5\' 6"  (1.676 m)   Wt 156 lb 6.4 oz (70.9 kg)   BMI 25.24 kg/m   Appears well nourished and in no distress.  Eyes: PERRLA, EOMs, conjunctiva no swelling or erythema. Sinuses:  No frontal/maxillary tenderness ENT/Mouth: EAC's clear, TM's nl w/o erythema, bulging. Nares clear w/o erythema, swelling, exudates. Oropharynx clear without erythema or exudates. Oral hygiene is good. Tongue normal, non obstructing. Hearing intact.  Neck: Supple. Thyroid nl. Car 2+/2+ with transmitted sys murmur. No nodes or JVD. Chest: Respirations nl with BS clear & equal w/o rales, rhonchi, wheezing or stridor.  Cor: Heart sounds soft w/ regular rate and rhythm w/ a gr 3/4 loud holosystolic murmur radiating widely and to both carotids. Pedal pulses 1+ without edema.  Abdomen: Soft & bowel sounds normal. Bilat femoral bruits.  Non-tender w/o guarding, rebound, hernias, masses, or organomegaly.  Lymphatics: Unremarkable.  Musculoskeletal: Full ROM all peripheral extremities, joint stability, 5/5 strength, and normal gait.  Skin: Warm, dry without exposed rashes, lesions or ecchymosis apparent.  Neuro: Cranial nerves intact, reflexes equal bilaterally. Sensory-motor testing grossly intact. Tendon reflexes grossly intact.  Pysch: Alert & oriented x 3.  Insight and judgement nl & appropriate. No ideations.  Assessment and Plan:   1. Essential hypertension  - Continue medication, monitor blood pressure at home.  - Continue DASH diet. Reminder to go to the  ER if any CP,  SOB, nausea, dizziness, severe HA, changes vision/speech,  left arm numbness and tingling and jaw pain.  - CBC with Differential/Platelet - BASIC METABOLIC PANEL WITH GFR - Magnesium - TSH  2. Mixed hyperlipidemia  - Concede a trial on diet, exercise & lifestyle modifications.  - Continue monitor periodic cholesterol/liver & renal functions   - Hepatic function panel - Lipid panel - TSH  3. Prediabetes  - Continue diet, exercise, lifestyle modifications.  - Monitor appropriate labs.  - Hemoglobin A1c - Insulin, random  4. Vitamin D deficiency  - Continue supplementation. - VITAMIN D 25 Hydroxy   5.  Hypothyroidism  - TSH  6. Nodule of left lung  - suspect slow resolving infection  7. Medication management  - CBC with Differential/Platelet - BASIC METABOLIC PANEL WITH GFR - Hepatic function panel - Magnesium - Lipid panel - TSH - Hemoglobin A1c - Insulin, random - FLONASE nasal spray; 2 spraysnostrils daily.  Disp: 16 g; Rf: 3       Recommended regular exercise, BP monitoring, weight control, and discussed med and SE's. Recommended labs to assess and monitor clinical status. Further disposition pending results of labs. Over 30 minutes of exam, counseling, chart review was performed

## 2016-09-15 NOTE — Telephone Encounter (Signed)
Reviewed prior GI notes. He had dilatation 10/2015 He needs to follow back with his GI Dr. Amedeo Plenty

## 2016-09-16 ENCOUNTER — Encounter: Payer: Self-pay | Admitting: Cardiovascular Disease

## 2016-09-16 ENCOUNTER — Ambulatory Visit (INDEPENDENT_AMBULATORY_CARE_PROVIDER_SITE_OTHER): Payer: Medicare Other | Admitting: Cardiovascular Disease

## 2016-09-16 VITALS — BP 158/56 | HR 71 | Ht 66.0 in | Wt 157.0 lb

## 2016-09-16 DIAGNOSIS — I10 Essential (primary) hypertension: Secondary | ICD-10-CM | POA: Diagnosis not present

## 2016-09-16 DIAGNOSIS — K559 Vascular disorder of intestine, unspecified: Secondary | ICD-10-CM | POA: Insufficient documentation

## 2016-09-16 DIAGNOSIS — E7800 Pure hypercholesterolemia, unspecified: Secondary | ICD-10-CM | POA: Diagnosis not present

## 2016-09-16 DIAGNOSIS — I350 Nonrheumatic aortic (valve) stenosis: Secondary | ICD-10-CM

## 2016-09-16 DIAGNOSIS — I739 Peripheral vascular disease, unspecified: Secondary | ICD-10-CM

## 2016-09-16 LAB — HEMOGLOBIN A1C
Hgb A1c MFr Bld: 4.9 % (ref <5.7)
Mean Plasma Glucose: 94 mg/dL

## 2016-09-16 LAB — INSULIN, RANDOM: INSULIN: 21.8 u[IU]/mL — AB (ref 2.0–19.6)

## 2016-09-16 LAB — VITAMIN D 25 HYDROXY (VIT D DEFICIENCY, FRACTURES): VIT D 25 HYDROXY: 86 ng/mL (ref 30–100)

## 2016-09-16 LAB — MAGNESIUM: Magnesium: 1.9 mg/dL (ref 1.5–2.5)

## 2016-09-16 NOTE — Progress Notes (Signed)
09/16/2016 Robert Schaefer   1940-10-06  [id number]  Primary Physician MCKEOWN,WILLIAM DAVID, MD Primary Cardiologist: [name] MD Renae Gloss  HPI:  Robert Schaefer is a delightful 76 year old married Caucasian male father of 2 children, grandfather 5 grandchildren who is covering by his wife [name]  He was referred by Dr. Melford Aase for cardiovascular evaluation because of chest pain and claudication. I last saw him in the office 05/27/16. His cardiovascular risk factor profile is notable for 100 pack years of tobacco abuse having quit 10 years ago. He has treated hypertension and hyperlipidemia. There is a family history of heart disease with a mother who died of a myocardial infarction age 13. He has never had a heart attack or stroke. He has had recent chest pain with a CT angiogram that ruled out pulmonary embolus but did show diffuse coronary calcification. An abdominal pelvic CT performed because of unexplained weight loss showed aortoiliac calcification size/atherosclerosis as well as atherosclerosis of the superior mesenteric artery and left renal artery. He complains of right greater than leftleg claudication of the last year occurring after walking 4-5 minutes. Since I saw him in the office he's had a Myoview stress test which was nonischemic problem, a 2-D echo that showed normal LV function with moderate aortic stenosis, carotid Dopplers that showed no evidence of ICA stenosis and lower extremity Dopplers reveal a right ABI of 0.48 with an occluded right common iliac and a left ABI of 1.23 with moderate left common iliac artery stenosis. He also complains a 30 pound weight loss of and had SMA calcification on abdominal CT. He may have mesenteric ischemia as well. He underwent peripheral angiography and intervention by myself 05/29/16 demonstrating a small abdominal aortic aneurysm, 95% calcified ostial superior mesenteric artery stenosis, right common iliac chronic total occlusion  was calcified and high-grade common iliac artery stenosis. He underwent diamondback orbital rotational atherectomy, PTA and covered stenting using Lifestream covered stents" of both common iliac arteries using "kissing stent technique. His claudication symptoms have resolved. The ABIs are normal. She does have a high-frequency signal in his right common iliac artery demonstrated a duplex ultrasound 06/09/16 was asymptomatic from this.  Current Outpatient Prescriptions  Medication Sig Dispense Refill  . alfuzosin (UROXATRAL) 10 MG 24 hr tablet Take 10 mg by mouth at bedtime.     [name] aspirin EC 81 MG tablet Take 81 mg by mouth daily.    . Cholecalciferol (VITAMIN D PO) Take 7,000 Units by mouth daily.    . clopidogrel (PLAVIX) 75 MG tablet Take 1 tablet (75 mg total) by mouth daily with breakfast. 30 tablet 11  . fluticasone (FLONASE) 50 MCG/ACT nasal spray Place 2 sprays into both nostrils daily. 16 g 3  . levothyroxine (SYNTHROID, LEVOTHROID) 50 MCG tablet TAKE ONE TABLET BY MOUTH ONCE DAILY 90 tablet 1  . losartan (COZAAR) 50 MG tablet Take 1 tablet (50 mg total) by mouth daily. 30 tablet 5  . simvastatin (ZOCOR) 20 MG tablet Take 7 mg by mouth every other day. At night     No current facility-administered medications for this visit.     Allergies  Allergen Reactions  . Penicillins Anaphylaxis and Swelling  . Sulfa Antibiotics Anaphylaxis and Swelling  . Versed [Midazolam] Other (See Comments)    Confusion, delerium    Social History   Social History  . Marital status: Married    Spouse name: N/A  . Number of children: N/A  . Years of education:  N/A   Occupational History  . Not on file.   Social History Main Topics  . Smoking status: Former Smoker    Packs/day: 1.00    Years: 50.00    Types: Cigarettes    Quit date: 07/05/2005  . Smokeless tobacco: Never Used  . Alcohol use No  . Drug use: No  . Sexual activity: Not on file   Other Topics Concern  . Not on file   Social  History Narrative  . No narrative on file     Review of Systems: General: negative for chills, fever, night sweats or weight changes.  Cardiovascular: negative for chest pain, dyspnea on exertion, edema, orthopnea, palpitations, paroxysmal nocturnal dyspnea or shortness of breath Dermatological: negative for rash Respiratory: negative for cough or wheezing Urologic: negative for hematuria Abdominal: negative for nausea, vomiting, diarrhea, bright red blood per rectum, melena, or hematemesis Neurologic: negative for visual changes, syncope, or dizziness All other systems reviewed and are otherwise negative except as noted above.    Blood pressure (!) 158/56, pulse 71, height 5\' 6"  (1.676 m), weight 157 lb (71.2 kg).  General appearance: alert and no distress Neck: no adenopathy, no JVD, supple, symmetrical, trachea midline, thyroid not enlarged, symmetric, no tenderness/mass/nodules and Soft bilateral carotid bruits Lungs: clear to auscultation bilaterally Heart: regular rate and rhythm, S1, S2 normal, no murmur, click, rub or gallop Extremities: extremities normal, atraumatic, no cyanosis or edema and 2+ pedal pulses bilaterally  EKG not performed today  ASSESSMENT AND PLAN:   Essential hypertension History of hypertension blood pressure measured today at 158/56. He is on losartan. Continue current meds at current dosing  Hyperlipidemia History of hyperlipidemia on statin therapy with recent lipid profile performed 09/15/16 revealing total cholesterol 111, LDL 52 and HDL of 40  Aortic stenosis, moderate History of moderate aortic stenosis by 2-D echo performed 05/05/16 with a valve area of 1.1 cm with peak gradient of 54 mmHg. This will be repeated on an annual basis. He is currently a symptomatically from this.  PVD (peripheral vascular disease) ([place]) History of peripheral vascular disease status post right common iliac chronic total occlusion diamondback orbital rotation  arthrectomy, PT and covered stenting using Lifestream covered stents as well as left common iliac PTA and covered stenting after atherectomy. I used kissing stent technique. His claudication has resolved. His ABIs are normal. He does have a high-frequency signal in his right common iliac artery which we'll follow by duplex ultrasound as well as a small abdominal aortic aneurysm measuring 3.1 cm.  Mesenteric ischemia ([facility]) At the time of angiography 05/29/16 I did demonstrate a 95% calcified ostial superior mesenteric artery stenosis. His weight has remained stable. I'm not convinced that he has food avoidance or symptoms of mesenteric ischemia. It sounds more like he has esophageal stricture and a recommended that he undergo endoscopy to further evaluate this. Should he become symptomatic with mesenteric ischemic symptoms I can certainly address Percutaneously.      [name] MD FACP,FACC,FAHA, [facility] 09/16/2016 2:22 PM

## 2016-09-16 NOTE — Assessment & Plan Note (Signed)
History of peripheral vascular disease status post right common iliac chronic total occlusion diamondback orbital rotation arthrectomy, PT and covered stenting using Lifestream covered stents as well as left common iliac PTA and covered stenting after atherectomy. I used kissing stent technique. His claudication has resolved. His ABIs are normal. He does have a high-frequency signal in his right common iliac artery which we'll follow by duplex ultrasound as well as a small abdominal aortic aneurysm measuring 3.1 cm.

## 2016-09-16 NOTE — Assessment & Plan Note (Signed)
At the time of angiography 05/29/16 I did demonstrate a 95% calcified ostial superior mesenteric artery stenosis. His weight has remained stable. I'm not convinced that he has food avoidance or symptoms of mesenteric ischemia. It sounds more like he has esophageal stricture and a recommended that he undergo endoscopy to further evaluate this. Should he become symptomatic with mesenteric ischemic symptoms I can certainly address Percutaneously.

## 2016-09-16 NOTE — Telephone Encounter (Signed)
Left message for patient

## 2016-09-16 NOTE — Assessment & Plan Note (Signed)
History of hypertension blood pressure measured today at 158/56. He is on losartan. Continue current meds at current dosing

## 2016-09-16 NOTE — Assessment & Plan Note (Signed)
History of hyperlipidemia on statin therapy with recent lipid profile performed 09/15/16 revealing total cholesterol 111, LDL 52 and HDL of 40

## 2016-09-16 NOTE — Patient Instructions (Signed)
Medication Instructions: Your physician recommends that you continue on your current medications as directed. Please refer to the Current Medication list given to you today.   Testing/Procedures: Your physician has requested that you have a aorta and iliac duplex. During this test, an ultrasound is used to evaluate blood flow to the aorta and iliac arteries. Allow one hour for this exam. Do not eat after midnight the day before and avoid carbonated beverages.   Your physician has requested that you have a lower extremity arterial duplex. During this test, ultrasound is used to evaluate arterial blood flow in the legs. Allow one hour for this exam. There are no restrictions or special instructions.  Your physician has requested that you have an ankle brachial index (ABI). During this test an ultrasound and blood pressure cuff are used to evaluate the arteries that supply the arms and legs with blood. Allow thirty minutes for this exam. There are no restrictions or special instructions.  (Schedule for September)  Your physician has requested that you have an echocardiogram in November. Echocardiography is a painless test that uses sound waves to create images of your heart. It provides your doctor with information about the size and shape of your heart and how well your heart's chambers and valves are working. This procedure takes approximately one hour. There are no restrictions for this procedure.   Follow-Up: Your physician wants you to follow-up in: 6 months with Dr. [name]. You will receive a reminder letter in the mail two months in advance. If you don't receive a letter, please call our office to schedule the follow-up appointment.  If you need a refill on your cardiac medications before your next appointment, please call your pharmacy.

## 2016-09-16 NOTE — Assessment & Plan Note (Signed)
History of moderate aortic stenosis by 2-D echo performed 05/05/16 with a valve area of 1.1 cm with peak gradient of 54 mmHg. This will be repeated on an annual basis. He is currently a symptomatically from this.

## 2016-09-17 ENCOUNTER — Ambulatory Visit: Payer: Self-pay | Admitting: Internal Medicine

## 2016-09-22 DIAGNOSIS — R972 Elevated prostate specific antigen [PSA]: Secondary | ICD-10-CM | POA: Diagnosis not present

## 2016-09-22 DIAGNOSIS — C61 Malignant neoplasm of prostate: Secondary | ICD-10-CM | POA: Diagnosis not present

## 2016-09-25 ENCOUNTER — Other Ambulatory Visit (HOSPITAL_COMMUNITY): Payer: Self-pay | Admitting: Urology

## 2016-09-25 ENCOUNTER — Telehealth: Payer: Self-pay | Admitting: *Deleted

## 2016-09-25 DIAGNOSIS — C61 Malignant neoplasm of prostate: Secondary | ICD-10-CM

## 2016-09-25 NOTE — Telephone Encounter (Signed)
Pt aware of lab results 

## 2016-10-09 ENCOUNTER — Encounter (HOSPITAL_COMMUNITY)
Admission: RE | Admit: 2016-10-09 | Discharge: 2016-10-09 | Disposition: A | Discharge status: Home / Self Care | Payer: Medicare Other | Source: Ambulatory Visit | Attending: Urology | Admitting: Urology

## 2016-10-09 DIAGNOSIS — C61 Malignant neoplasm of prostate: Secondary | ICD-10-CM | POA: Insufficient documentation

## 2016-10-09 MED ORDER — TECHNETIUM TC 99M MEDRONATE IV KIT
22.0000 | PACK | Freq: Once | INTRAVENOUS | Status: AC | PRN
  Administered 2016-10-09: 22 via INTRAVENOUS

## 2016-10-14 DIAGNOSIS — R351 Nocturia: Secondary | ICD-10-CM | POA: Diagnosis not present

## 2016-10-14 DIAGNOSIS — C61 Malignant neoplasm of prostate: Secondary | ICD-10-CM | POA: Diagnosis not present

## 2016-10-14 DIAGNOSIS — R972 Elevated prostate specific antigen [PSA]: Secondary | ICD-10-CM | POA: Diagnosis not present

## 2016-10-14 DIAGNOSIS — N401 Enlarged prostate with lower urinary tract symptoms: Secondary | ICD-10-CM | POA: Diagnosis not present

## 2016-12-10 ENCOUNTER — Encounter: Payer: Self-pay | Admitting: Internal Medicine

## 2016-12-18 ENCOUNTER — Other Ambulatory Visit: Payer: Self-pay

## 2016-12-18 MED ORDER — LEVOTHYROXINE SODIUM 50 MCG PO TABS: 50.0000 ug | ORAL_TABLET | Freq: Every day | ORAL | 1 refills | Status: DC

## 2016-12-22 ENCOUNTER — Other Ambulatory Visit: Payer: Self-pay | Admitting: *Deleted

## 2016-12-22 MED ORDER — LEVOTHYROXINE SODIUM 50 MCG PO TABS: 50.0000 ug | ORAL_TABLET | Freq: Every day | ORAL | 1 refills | Status: DC

## 2017-01-05 ENCOUNTER — Ambulatory Visit (INDEPENDENT_AMBULATORY_CARE_PROVIDER_SITE_OTHER)
Admission: RE | Admit: 2017-01-05 | Discharge: 2017-01-05 | Disposition: A | Discharge status: Home / Self Care | Payer: Medicare Other | Source: Ambulatory Visit | Attending: Pulmonary Disease | Admitting: Pulmonary Disease

## 2017-01-05 DIAGNOSIS — J984 Other disorders of lung: Secondary | ICD-10-CM

## 2017-01-05 DIAGNOSIS — R918 Other nonspecific abnormal finding of lung field: Secondary | ICD-10-CM | POA: Diagnosis not present

## 2017-01-09 ENCOUNTER — Ambulatory Visit (INDEPENDENT_AMBULATORY_CARE_PROVIDER_SITE_OTHER): Payer: Medicare Other | Admitting: Pulmonary Disease

## 2017-01-09 ENCOUNTER — Encounter: Payer: Self-pay | Admitting: Pulmonary Disease

## 2017-01-09 DIAGNOSIS — J984 Other disorders of lung: Secondary | ICD-10-CM | POA: Diagnosis not present

## 2017-01-09 DIAGNOSIS — J449 Chronic obstructive pulmonary disease, unspecified: Secondary | ICD-10-CM | POA: Diagnosis not present

## 2017-01-09 DIAGNOSIS — K449 Diaphragmatic hernia without obstruction or gangrene: Secondary | ICD-10-CM | POA: Diagnosis not present

## 2017-01-09 MED ORDER — ALBUTEROL SULFATE HFA 108 (90 BASE) MCG/ACT IN AERS: 2.0000 | INHALATION_SPRAY | Freq: Four times a day (QID) | RESPIRATORY_TRACT | 6 refills | Status: DC | PRN

## 2017-01-09 NOTE — Patient Instructions (Signed)
Sample of albuterol MDI-use 2 puffs as needed for shortness of breath  Get Back with GI doctor to evaluate your swallowing issues

## 2017-01-09 NOTE — Progress Notes (Signed)
   Subjective:    Patient ID: Robert Schaefer, male    DOB: 05-Jun-1941, 76 y.o.   MRN: [id number]  HPI  76 year old heavy ex-smoker for FU of cavitating lesion in left lung .  He smoked more than 50 pack years before he quit in 2007.  He reports dysphagia to solids and liquids for many years and had dilatation procedure by Dr. Amedeo Plenty. CT imaging has shown dilated esophagus with diverticulum   His breathing is worse especially in the hot weather. He denies wheezing or sputum production. He continues to have dysphagia, denies obvious aspiration episodes  Significant tests/ events   CT chest 03/27/16 showed 2.51.6 cm nodule in the left upper lobe. There was calcified mediastinal and hilar lymph nodes and esophagus appeared dilated with fluid. Follow-up CT scan on 05/2016 showed interval cavitation of this left upper lobe lesion with increase in size to 4.42.9 cm.The esophagus again appeared dilated with a moderate hiatal hernia  CT chest 12/2016 >> Continued contraction of the left upper lobe lung lesion  PFTs 07/2016 showed ratio 62, FEV1 66%, FVC 76% with DLCO 60% and preserved TLC at 101%  PET scan 07/2016 decreased size of cavity, mild hypermetabolism, lower esophageal diverticulum   Review of Systems neg for any significant sore throat, dysphagia, itching, sneezing, nasal congestion or excess/ purulent secretions, fever, chills, sweats, unintended wt loss, pleuritic or exertional cp, hempoptysis, orthopnea pnd or change in chronic leg swelling. Also denies presyncope, palpitations, heartburn, abdominal pain, nausea, vomiting, diarrhea or change in bowel or urinary habits, dysuria,hematuria, rash, arthralgias, visual complaints, headache, numbness weakness or ataxia.     Objective:   Physical Exam  Gen. Pleasant, well-nourished, in no distress ENT - no thrush, no post nasal drip Neck: No JVD, no thyromegaly, no carotid bruits Lungs: no use of accessory muscles, no dullness to  percussion, clear without rales or rhonchi  Cardiovascular: Rhythm regular, heart sounds  normal, no murmurs or gallops, no peripheral edema Musculoskeletal: No deformities, no cyanosis or clubbing          Assessment & Plan:

## 2017-01-09 NOTE — Assessment & Plan Note (Signed)
Favor aspiration pneumonia with cavitating mass now resolving with scarring-cannot feel the need for further CT imaging Can get follow-up chest x-ray in 6 months

## 2017-01-09 NOTE — Assessment & Plan Note (Signed)
Persuaded him to seek GI re -evaluation for esophageal diverticulum and dysphagia

## 2017-01-09 NOTE — Addendum Note (Signed)
Addended by: [name] on: 01/09/2017 11:48 AM   Modules accepted: Orders

## 2017-01-09 NOTE — Assessment & Plan Note (Signed)
Sample of albuterol MDI-use 2 puffs as needed for shortness of breath

## 2017-02-10 DIAGNOSIS — C61 Malignant neoplasm of prostate: Secondary | ICD-10-CM | POA: Diagnosis not present

## 2017-02-17 DIAGNOSIS — R351 Nocturia: Secondary | ICD-10-CM | POA: Diagnosis not present

## 2017-02-17 DIAGNOSIS — C61 Malignant neoplasm of prostate: Secondary | ICD-10-CM | POA: Diagnosis not present

## 2017-02-18 ENCOUNTER — Encounter: Payer: Self-pay | Admitting: Radiation Oncology

## 2017-02-25 DIAGNOSIS — D4989 Neoplasm of unspecified behavior of other specified sites: Secondary | ICD-10-CM | POA: Diagnosis not present

## 2017-02-25 DIAGNOSIS — L723 Sebaceous cyst: Secondary | ICD-10-CM | POA: Diagnosis not present

## 2017-02-25 DIAGNOSIS — D225 Melanocytic nevi of trunk: Secondary | ICD-10-CM | POA: Diagnosis not present

## 2017-02-25 DIAGNOSIS — D2272 Melanocytic nevi of left lower limb, including hip: Secondary | ICD-10-CM | POA: Diagnosis not present

## 2017-02-25 DIAGNOSIS — Z86018 Personal history of other benign neoplasm: Secondary | ICD-10-CM | POA: Diagnosis not present

## 2017-02-25 DIAGNOSIS — D485 Neoplasm of uncertain behavior of skin: Secondary | ICD-10-CM | POA: Diagnosis not present

## 2017-02-25 NOTE — Progress Notes (Signed)
MEDICARE ANNUAL WELLNESS VISIT AND FOLLOW UP  Assessment:    Essential hypertension - continue medications, DASH diet, exercise and monitor at home. Call if greater than 130/80.  -     CBC with Differential/Platelet -     BASIC METABOLIC PANEL WITH GFR -     Hepatic function panel -     TSH -     Urinalysis, Routine w reflex microscopic -     Microalbumin / creatinine urine ratio  PVD (peripheral vascular disease) (HCC) Control blood pressure, cholesterol, glucose, increase exercise.   Atherosclerosis of aorta (HCC) Control blood pressure, cholesterol, glucose, increase exercise.   Mesenteric ischemia (HCC) Control blood pressure, cholesterol, glucose, increase exercise.   COPD  Hypothyroidism, unspecified type Hypothyroidism-check TSH level, continue medications the same, reminded to take on an empty stomach 30-68mins before food.  -     TSH  Pure hypercholesterolemia -continue medications, check lipids, decrease fatty foods, increase activity.  -     Lipid panel  Medication management -     Magnesium  Claudication (HCC) Control blood pressure, cholesterol, glucose, increase exercise.   Advanced care planning/counseling discussion Discussed with patient, will bring in papers  Aortic stenosis, moderate No symptoms, will monitor  OSA and COPD overlap syndrome (HCC) Not on CPAP, states can not wear, STRONGLY SUGGEST getting on it with heart/lung history, given information and will consider  COPD with asthma ([place]) Not on CPAP, states can not wear, STRONGLY SUGGEST getting on it with heart/lung history, given information and will consider Follow up pulmonary  Cavitating mass in left upper lung lobe Follow up pulmonary  Hiatal hernia Monitor  Gastroesophageal reflux disease, esophagitis presence not specified Continue PPI/H2 blocker, diet discussed  Irritable bowel syndrome, unspecified type If not on benefiber then add it, decrease stress,  if any worsening  symptoms, blood in stool, AB pain, etc call office  BPH w/LUTS monitor  Prediabetes Discussed general issues about diabetes pathophysiology and management., Educational material distributed., Suggested low cholesterol diet., Encouraged aerobic exercise., Discussed foot care., Reminded to get yearly retinal exam.  RLS (restless legs syndrome)  Vitamin D deficiency Continue supplement  Prostate cancer ([place]) Continue follow up Comments: Following with Dr. [name]   Over 40 minutes of exam, counseling, chart review and critical decision making was performed  Future Appointments Date Time Provider [place]  03/04/2017 10:30 AM [facility] NURSE [facility] None  03/04/2017 11:00 AM Tyler Pita, MD [facility] None  03/12/2017 8:00 AM [facility] VASC 2 [facility]  03/12/2017 9:00 AM [facility] VASC 2 [facility]  03/17/2017 10:15 AM [name], MD [facility]  05/13/2017 10:30 AM [facility] ECHO 1 [facility]  03/04/2018 9:00 AM Vicie Mutters, PA-C [facility] None     Plan:   During the course of the visit the patient was educated and counseled about appropriate screening and preventive services including:    Pneumococcal vaccine  Prevnar 13   Influenza vaccine  Td vaccine  Screening electrocardiogram  Bone densitometry screening  Colorectal cancer screening  Diabetes screening  Glaucoma screening  Nutrition counseling   Advanced directives: requested   Subjective:  Markeith Jue is a 76 y.o. male who presents for Medicare Annual Wellness Visit and complete physical.    He has had elevated blood pressure since . His blood pressure has been controlled at home, today their BP is BP: 118/66 He does not workout. He denies chest pain, shortness of breath, dizziness.  He was sent to see cardiology  last year for claudication, chest pain, and weight loss. Had CTA that showed PAD, he had intervention with Dr. [name] to right common  iliac and mesenteric artery is being monitored.He nonischemic stress test and moderate AS. He has COPD, is ex smoker, quit 10 years ago, and is on plavix and ASA. He is following with Dr. [name] for prostate cancer, may start radiation.  He is on cholesterol medication and denies myalgias. His cholesterol is at goal. The cholesterol last visit was:   Lab Results  Component Value Date   CHOL 111 09/15/2016   HDL 40 (L) 09/15/2016   LDLCALC 52 09/15/2016   TRIG 96 09/15/2016   CHOLHDL 2.8 09/15/2016   He has had prediabetes for years. He has been working on diet and exercise for prediabetes, and denies foot ulcerations, hyperglycemia, hypoglycemia , increased appetite, paresthesia of the feet, polydipsia, polyuria, visual disturbances and weight loss. Last A1C in the office was:  Lab Results  Component Value Date   HGBA1C 4.9 09/15/2016   Last GFR: Lab Results  Component Value Date   GFRNONAA 70 09/15/2016   Patient is on Vitamin D supplement.   Lab Results  Component Value Date   VD25OH 37 09/15/2016   He is on thyroid medication. His medication was not changed last visit.   Lab Results  Component Value Date   TSH 2.95 09/15/2016  .  BMI is Body mass index is 25.23 kg/m., he is working on diet and exercise. Wt Readings from Last 3 Encounters:  02/26/17 151 lb 9.6 oz (68.8 kg)  01/09/17 156 lb 3.2 oz (70.9 kg)  09/16/16 157 lb (71.2 kg)     Medication Review: Current Outpatient Prescriptions on File Prior to Visit  Medication Sig Dispense Refill  . albuterol (PROVENTIL HFA;VENTOLIN HFA) 108 (90 Base) MCG/ACT inhaler Inhale 2 puffs into the lungs every 6 (six) hours as needed for wheezing or shortness of breath. 1 Inhaler 6  . alfuzosin (UROXATRAL) 10 MG 24 hr tablet Take 10 mg by mouth at bedtime.     [name] aspirin EC 81 MG tablet Take 81 mg by mouth daily.    . Cholecalciferol (VITAMIN D PO) Take 7,000 Units by mouth daily.    . clopidogrel (PLAVIX) 75 MG tablet Take 1 tablet  (75 mg total) by mouth daily with breakfast. 30 tablet 11  . fluticasone (FLONASE) 50 MCG/ACT nasal spray Place 2 sprays into both nostrils daily. 16 g 3  . levothyroxine (SYNTHROID, LEVOTHROID) 50 MCG tablet Take 1 tablet (50 mcg total) by mouth daily. 90 tablet 1  . losartan (COZAAR) 50 MG tablet Take 1 tablet (50 mg total) by mouth daily. 30 tablet 5  . simvastatin (ZOCOR) 20 MG tablet Take 7 mg by mouth every other day. At night     No current facility-administered medications on file prior to visit.     Current Problems (verified) Patient Active Problem List   Diagnosis Date Noted  . Mesenteric ischemia ([place]) 09/16/2016  . Atherosclerosis of aorta ([place]) 09/15/2016  . Hiatal hernia 09/03/2016  . Cavitating mass in left upper lung lobe 08/12/2016  . PVD (peripheral vascular disease) ([place]) 05/31/2016  . Aortic stenosis, moderate 05/27/2016  . Claudication ([place]) 04/16/2016  . BMI 29.0-29.9,adult 06/01/2015  . Hypothyroidism 11/07/2014  . COPD 11/07/2014  . BPH w/LUTS 11/07/2014  . Medication management 04/11/2014  . Vitamin D deficiency 07/05/2013  . Hyperlipidemia   . Prediabetes   . COPD with asthma ([place])   .  IBS (irritable bowel syndrome)   . RLS (restless legs syndrome)   . OSA and COPD overlap syndrome ([place])   . Essential hypertension 09/28/2009  . GERD 09/28/2009    Screening Tests Immunization History  Administered Date(s) Administered  . DT 09/22/2013  . Influenza, High Dose Seasonal PF 04/11/2014, 04/20/2015  . Zoster 06/30/2005    Preventative care: Last colonoscopy: 2011 PFT 07/2016  Prior vaccinations: TD or Tdap: 2015  Influenza: 2016  Pneumococcal:  Prevnar13:  Shingles/Zostavax: 2007  Names of Other Physician/Practitioners you currently use: 1. [facility] here for primary care 2. Does not see one in particular, eye doctor, last visit 2015 3. Dr.Jones , dentist, last visit 2017  Patient Care  Team: [name], MD as PCP - General (Internal Medicine) Teena Irani, MD as Consulting Physician (Gastroenterology) Jari Pigg, MD as Consulting Physician (Dermatology) Larey Dresser, MD as Consulting Physician (Cardiology) Carolan Clines, MD as Consulting Physician (Urology) Erroll Luna, MD as Consulting Physician (General Surgery) Josue Hector, MD as Consulting Physician (Cardiology)  Allergies Allergies  Allergen Reactions  . Penicillins Anaphylaxis and Swelling  . Sulfa Antibiotics Anaphylaxis and Swelling  . Versed [Midazolam] Other (See Comments)    Confusion, delerium    SURGICAL HISTORY He  has a past surgical history that includes Colonoscopy; Upper gi endoscopy; Pilonidal cyst / sinus excision (2000); Insertion of mesh (Right, 02/23/2014); Inguinal hernia repair (Right, 02/23/2014); Iliac artery stent (Bilateral, 05/29/2016); Hernia repair; Cataract extraction w/ intraocular lens  implant, bilateral (Bilateral); Esophagogastroduodenoscopy (egd) with esophageal dilation ("multiple times"); and Cardiac catheterization (N/A, 05/29/2016). FAMILY HISTORY His family history includes Asthma in his father; Heart attack in his mother; Stroke in his father. SOCIAL HISTORY He  reports that he quit smoking about 11 years ago. His smoking use included Cigarettes. He has a 50.00 pack-year smoking history. He has never used smokeless tobacco. He reports that he does not drink alcohol or use drugs.  MEDICARE WELLNESS OBJECTIVES: Physical activity: Current Exercise Habits: The patient does not participate in regular exercise at present Cardiac risk factors: Cardiac Risk Factors include: advanced age (>73men, >62 women);dyslipidemia;hypertension;male gender;sedentary lifestyle;family history of premature cardiovascular disease Depression/mood screen:   Depression screen [facility] 2/9 02/26/2017  Decreased Interest 0  Down, Depressed, Hopeless 0  PHQ - 2 Score 0    ADLs:  In  your present state of health, do you have any difficulty performing the following activities: 02/26/2017 09/15/2016  Hearing? N N  Vision? N N  Difficulty concentrating or making decisions? N N  Walking or climbing stairs? N N  Comment - -  Dressing or bathing? N N  Doing errands, shopping? N N  Comment - -  Some recent data might be hidden     Cognitive Testing  Alert? Yes  Normal Appearance?Yes  Oriented to person? Yes  Place? Yes   Time? Yes  Recall of three objects?  Yes  Can perform simple calculations? Yes  Displays appropriate judgment?Yes  Can read the correct time from a watch face?Yes  EOL planning: Does Patient Have a Medical Advance Directive?: Yes Type of Advance Directive: Healthcare Power of Attorney, Living will Does patient want to make changes to medical advance directive?: No - Patient declined Copy of [place] in Chart?: No - copy requested  Review of Systems  Constitutional: Negative for chills, fever and malaise/fatigue.  HENT: Negative for congestion and sore throat.   Respiratory: Negative for cough, shortness of breath and wheezing.  Cardiovascular: Negative for chest pain, palpitations and leg swelling.  Gastrointestinal: Negative for abdominal pain, blood in stool, constipation, diarrhea, heartburn, melena, nausea and vomiting.  Genitourinary: Negative.   Musculoskeletal: Positive for joint pain and myalgias.  Skin: Negative.   Neurological: Negative for dizziness, sensory change, loss of consciousness and headaches.  Psychiatric/Behavioral: Negative for depression. The patient is not nervous/anxious and does not have insomnia.      Objective:     Today's Vitals   02/26/17 0955  BP: 118/66  Pulse: 70  Resp: 14  Temp: 97.7 F (36.5 C)  SpO2: 95%  Weight: 151 lb 9.6 oz (68.8 kg)  Height: 5\' 5"  (1.651 m)   Body mass index is 25.23 kg/m.  General appearance: alert, no distress, WD/WN, male HEENT: normocephalic, sclerae  anicteric, TMs pearly, nares patent, no discharge or erythema, pharynx normal Oral cavity: MMM, no lesions Neck: supple, no lymphadenopathy, no thyromegaly, no masses Heart: RRR, normal S1, S2, crescendo decresendo murmur to the 2nd right ICS with radiation to the LSB Lungs: CTA bilaterally, no wheezes, rhonchi, or rales Abdomen: +bs, soft, non tender, non distended, no masses, no hepatomegaly, no splenomegaly Musculoskeletal: nontender, no swelling, no obvious deformity Extremities: no edema, no cyanosis, no clubbing Pulses: 2+ symmetric, upper and lower extremities, normal cap refill Neurological: alert, oriented x 3, CN2-12 intact, strength normal upper extremities and lower extremities, sensation normal throughout, DTRs 2+ throughout, no cerebellar signs, gait Normal Psychiatric: normal affect, behavior normal, pleasant   Medicare Attestation I have personally reviewed: The patient's medical and social history Their use of alcohol, tobacco or illicit drugs Their current medications and supplements The patient's functional ability including ADLs,fall risks, home safety risks, cognitive, and hearing and visual impairment Diet and physical activities Evidence for depression or mood disorders  The patient's weight, height, BMI, and visual acuity have been recorded in the chart.  I have made referrals, counseling, and provided education to the patient based on review of the above and I have provided the patient with a written personalized care plan for preventive services.     Vicie Mutters, PA-C   02/26/2017

## 2017-02-26 ENCOUNTER — Ambulatory Visit: Payer: Medicare Other

## 2017-02-26 ENCOUNTER — Ambulatory Visit (INDEPENDENT_AMBULATORY_CARE_PROVIDER_SITE_OTHER): Payer: Medicare Other | Admitting: Physician Assistant

## 2017-02-26 ENCOUNTER — Ambulatory Visit: Payer: Medicare Other | Admitting: Radiation Oncology

## 2017-02-26 ENCOUNTER — Encounter: Payer: Self-pay | Admitting: Physician Assistant

## 2017-02-26 VITALS — BP 118/66 | HR 70 | Temp 97.7°F | Resp 14 | Ht 65.0 in | Wt 151.6 lb

## 2017-02-26 DIAGNOSIS — I739 Peripheral vascular disease, unspecified: Secondary | ICD-10-CM

## 2017-02-26 DIAGNOSIS — J984 Other disorders of lung: Secondary | ICD-10-CM

## 2017-02-26 DIAGNOSIS — K449 Diaphragmatic hernia without obstruction or gangrene: Secondary | ICD-10-CM | POA: Diagnosis not present

## 2017-02-26 DIAGNOSIS — I10 Essential (primary) hypertension: Secondary | ICD-10-CM

## 2017-02-26 DIAGNOSIS — K559 Vascular disorder of intestine, unspecified: Secondary | ICD-10-CM | POA: Diagnosis not present

## 2017-02-26 DIAGNOSIS — E039 Hypothyroidism, unspecified: Secondary | ICD-10-CM

## 2017-02-26 DIAGNOSIS — E7800 Pure hypercholesterolemia, unspecified: Secondary | ICD-10-CM

## 2017-02-26 DIAGNOSIS — R7303 Prediabetes: Secondary | ICD-10-CM

## 2017-02-26 DIAGNOSIS — G2581 Restless legs syndrome: Secondary | ICD-10-CM

## 2017-02-26 DIAGNOSIS — J449 Chronic obstructive pulmonary disease, unspecified: Secondary | ICD-10-CM

## 2017-02-26 DIAGNOSIS — I350 Nonrheumatic aortic (valve) stenosis: Secondary | ICD-10-CM | POA: Diagnosis not present

## 2017-02-26 DIAGNOSIS — I700 Atherosclerosis of aorta: Secondary | ICD-10-CM

## 2017-02-26 DIAGNOSIS — Z79899 Other long term (current) drug therapy: Secondary | ICD-10-CM

## 2017-02-26 DIAGNOSIS — Z0001 Encounter for general adult medical examination with abnormal findings: Secondary | ICD-10-CM | POA: Diagnosis not present

## 2017-02-26 DIAGNOSIS — E559 Vitamin D deficiency, unspecified: Secondary | ICD-10-CM

## 2017-02-26 DIAGNOSIS — K219 Gastro-esophageal reflux disease without esophagitis: Secondary | ICD-10-CM

## 2017-02-26 DIAGNOSIS — R6889 Other general symptoms and signs: Secondary | ICD-10-CM | POA: Diagnosis not present

## 2017-02-26 DIAGNOSIS — G4733 Obstructive sleep apnea (adult) (pediatric): Secondary | ICD-10-CM | POA: Diagnosis not present

## 2017-02-26 DIAGNOSIS — N138 Other obstructive and reflux uropathy: Secondary | ICD-10-CM

## 2017-02-26 DIAGNOSIS — Z0000 Encounter for general adult medical examination without abnormal findings: Secondary | ICD-10-CM

## 2017-02-26 DIAGNOSIS — N401 Enlarged prostate with lower urinary tract symptoms: Secondary | ICD-10-CM

## 2017-02-26 DIAGNOSIS — Z7189 Other specified counseling: Secondary | ICD-10-CM

## 2017-02-26 DIAGNOSIS — C61 Malignant neoplasm of prostate: Secondary | ICD-10-CM

## 2017-02-26 DIAGNOSIS — K589 Irritable bowel syndrome without diarrhea: Secondary | ICD-10-CM

## 2017-02-26 NOTE — Patient Instructions (Addendum)
Get on miralax or stool softener Benefiber is good for constipation/diarrhea/irritable bowel syndrome, it helps with weight loss and can help lower your bad cholesterol. Please do 1 TBSP in the morning in water, coffee, or tea. It can take up to a month before you can see a difference with your bowel movements. It is cheapest from [employer], [employer], [employer].    Stop losartan for now and monitor BP If BP is above 130/80 Continue cholesterol medication  Monitor your blood pressure at home. Go to the ER if any CP, SOB, nausea, dizziness, severe HA, changes vision/speech  Goal BP:  For patients with diabetes: Goal BP < 140/90. Your most recent BP: BP: 118/66   Take your medications faithfully as instructed. Maintain a healthy weight. Get at least 150 minutes of aerobic exercise per week. Minimize salt intake. Minimize alcohol intake  DASH Eating Plan DASH stands for "Dietary Approaches to Stop Hypertension." The DASH eating plan is a healthy eating plan that has been shown to reduce high blood pressure (hypertension). Additional health benefits may include reducing the risk of type 2 diabetes mellitus, heart disease, and stroke. The DASH eating plan may also help with weight loss. WHAT DO I NEED TO KNOW ABOUT THE DASH EATING PLAN? For the DASH eating plan, you will follow these general guidelines:  Choose foods with a percent daily value for sodium of less than 5% (as listed on the food label).  Use salt-free seasonings or herbs instead of table salt or sea salt.  Check with your health care provider or pharmacist before using salt substitutes.  Eat lower-sodium products, often labeled as "lower sodium" or "no salt added."  Eat fresh foods.  Eat more vegetables, fruits, and low-fat dairy products.  Choose whole grains. Look for the word "whole" as the first word in the ingredient list.  Choose fish and skinless chicken or [place] more often than red meat. Limit fish, poultry, and meat  to 6 oz (170 g) each day.  Limit sweets, desserts, sugars, and sugary drinks.  Choose heart-healthy fats.  Limit cheese to 1 oz (28 g) per day.  Eat more home-cooked food and less restaurant, buffet, and fast food.  Limit fried foods.  Cook foods using methods other than frying.  Limit canned vegetables. If you do use them, rinse them well to decrease the sodium.  When eating at a restaurant, ask that your food be prepared with less salt, or no salt if possible. WHAT FOODS CAN I EAT? Seek help from a dietitian for individual calorie needs. Grains Whole grain or whole wheat bread. Brown rice. Whole grain or whole wheat pasta. Quinoa, bulgur, and whole grain cereals. Low-sodium cereals. Corn or whole wheat flour tortillas. Whole grain cornbread. Whole grain crackers. Low-sodium crackers. Vegetables Fresh or frozen vegetables (raw, steamed, roasted, or grilled). Low-sodium or reduced-sodium tomato and vegetable juices. Low-sodium or reduced-sodium tomato sauce and paste. Low-sodium or reduced-sodium canned vegetables.  Fruits All fresh, canned (in natural juice), or frozen fruits. Meat and Other Protein Products Ground beef (85% or leaner), grass-fed beef, or beef trimmed of fat. Skinless chicken or [place]. Ground chicken or [place]. Pork trimmed of fat. All fish and seafood. Eggs. Dried beans, peas, or lentils. Unsalted nuts and seeds. Unsalted canned beans. Dairy Low-fat dairy products, such as skim or 1% milk, 2% or reduced-fat cheeses, low-fat ricotta or cottage cheese, or plain low-fat yogurt. Low-sodium or reduced-sodium cheeses. Fats and Oils Tub margarines without trans fats. Light or reduced-fat mayonnaise and  salad dressings (reduced sodium). Avocado. Safflower, olive, or canola oils. Natural peanut or almond butter. Other Unsalted popcorn and pretzels. The items listed above may not be a complete list of recommended foods or beverages. Contact your dietitian for more  options. WHAT FOODS ARE NOT RECOMMENDED? Grains White bread. White pasta. White rice. Refined cornbread. Bagels and croissants. Crackers that contain trans fat. Vegetables Creamed or fried vegetables. Vegetables in a cheese sauce. Regular canned vegetables. Regular canned tomato sauce and paste. Regular tomato and vegetable juices. Fruits Dried fruits. Canned fruit in light or heavy syrup. Fruit juice. Meat and Other Protein Products Fatty cuts of meat. Ribs, chicken wings, bacon, sausage, bologna, salami, chitterlings, fatback, hot dogs, bratwurst, and packaged luncheon meats. Salted nuts and seeds. Canned beans with salt. Dairy Whole or 2% milk, cream, half-and-half, and cream cheese. Whole-fat or sweetened yogurt. Full-fat cheeses or blue cheese. Nondairy creamers and whipped toppings. Processed cheese, cheese spreads, or cheese curds. Condiments Onion and garlic salt, seasoned salt, table salt, and sea salt. Canned and packaged gravies. Worcestershire sauce. Tartar sauce. Barbecue sauce. Teriyaki sauce. Soy sauce, including reduced sodium. Steak sauce. Fish sauce. Oyster sauce. Cocktail sauce. Horseradish. Ketchup and mustard. Meat flavorings and tenderizers. Bouillon cubes. Hot sauce. Tabasco sauce. Marinades. Taco seasonings. Relishes. Fats and Oils Butter, stick margarine, lard, shortening, ghee, and bacon fat. Coconut, palm kernel, or palm oils. Regular salad dressings. Other Pickles and olives. Salted popcorn and pretzels. The items listed above may not be a complete list of foods and beverages to avoid. Contact your dietitian for more information. WHERE CAN I FIND MORE INFORMATION? National [employer], [employer], [employer] Institute: travelstabl[employer].com Document Released: 06/05/2011 Document Revised: 10/31/2013 Document Reviewed: 04/20/2013 [facility] Patient Information 2015 [place], [place]. This information is not intended to replace advice given to you by your  health care provider. Make sure you discuss any questions you have with your health care provider.   I think it is possible that you have sleep apnea. It can cause interrupted sleep, headaches, frequent awakenings, fatigue, dry mouth, fast/slow heart beats, memory issues, anxiety/depression, swelling, numbness tingling hands/feet, weight gain, shortness of breath, and the list goes on. Sleep apnea needs to be ruled out because if it is left untreated it does eventually lead to abnormal heart beats, lung failure or heart failure as well as increasing the risk of heart attack and stroke. There are masks you can wear OR a mouth piece that I can give you information about. Often times though people feel MUCH better after getting treatment.   Sleep Apnea  Sleep apnea is a sleep disorder characterized by abnormal pauses in breathing while you sleep. When your breathing pauses, the level of oxygen in your blood decreases. This causes you to move out of deep sleep and into light sleep. As a result, your quality of sleep is poor, and the system that carries your blood throughout your body (cardiovascular system) experiences stress. If sleep apnea remains untreated, the following conditions can develop:  High blood pressure (hypertension).  Coronary artery disease.  Inability to achieve or maintain an erection (impotence).  Impairment of your thought process (cognitive dysfunction). There are three types of sleep apnea: 1. Obstructive sleep apnea--Pauses in breathing during sleep because of a blocked airway. 2. Central sleep apnea--Pauses in breathing during sleep because the area of the brain that controls your breathing does not send the correct signals to the muscles that control breathing. 3. Mixed sleep apnea--A combination of both obstructive and central sleep  apnea.  RISK FACTORS The following risk factors can increase your risk of developing sleep apnea:  Being overweight.  Smoking.  Having  narrow passages in your nose and throat.  Being of older age.  Being male.  Alcohol use.  Sedative and tranquilizer use.  Ethnicity. Among individuals younger than 35 years, African Americans are at increased risk of sleep apnea. SYMPTOMS   Difficulty staying asleep.  Daytime sleepiness and fatigue.  Loss of energy.  Irritability.  Loud, heavy snoring.  Morning headaches.  Trouble concentrating.  Forgetfulness.  Decreased interest in sex. DIAGNOSIS  In order to diagnose sleep apnea, your caregiver will perform a physical examination. Your caregiver may suggest that you take a home sleep test. Your caregiver may also recommend that you spend the night in a sleep lab. In the sleep lab, several monitors record information about your heart, lungs, and brain while you sleep. Your leg and arm movements and blood oxygen level are also recorded. TREATMENT The following actions may help to resolve mild sleep apnea:  Sleeping on your side.   Using a decongestant if you have nasal congestion.   Avoiding the use of depressants, including alcohol, sedatives, and narcotics.   Losing weight and modifying your diet if you are overweight. There also are devices and treatments to help open your airway:  Oral appliances. These are custom-made mouthpieces that shift your lower jaw forward and slightly open your bite. This opens your airway.  Devices that create positive airway pressure. This positive pressure "splints" your airway open to help you breathe better during sleep. The following devices create positive airway pressure:  Continuous positive airway pressure (CPAP) device. The CPAP device creates a continuous level of air pressure with an air pump. The air is delivered to your airway through a mask while you sleep. This continuous pressure keeps your airway open.  Nasal expiratory positive airway pressure (EPAP) device. The EPAP device creates positive air pressure as you  exhale. The device consists of single-use valves, which are inserted into each nostril and held in place by adhesive. The valves create very little resistance when you inhale but create much more resistance when you exhale. That increased resistance creates the positive airway pressure. This positive pressure while you exhale keeps your airway open, making it easier to breath when you inhale again.  Bilevel positive airway pressure (BPAP) device. The BPAP device is used mainly in patients with central sleep apnea. This device is similar to the CPAP device because it also uses an air pump to deliver continuous air pressure through a mask. However, with the BPAP machine, the pressure is set at two different levels. The pressure when you exhale is lower than the pressure when you inhale.  Surgery. Typically, surgery is only done if you cannot comply with less invasive treatments or if the less invasive treatments do not improve your condition. Surgery involves removing excess tissue in your airway to create a wider passage way. Document Released: 06/06/2002 Document Revised: 10/11/2012 Document Reviewed: 10/23/2011 [facility] Patient Information 2015 [place], [place]. This information is not intended to replace advice given to you by your health care provider. Make sure you discuss any questions you have with your health care provider.

## 2017-02-27 LAB — CBC WITH DIFFERENTIAL/PLATELET
BASOS ABS: 41 {cells}/uL (ref 0–200)
Basophils Relative: 0.5 %
EOS PCT: 2.6 %
Eosinophils Absolute: 211 cells/uL (ref 15–500)
HEMATOCRIT: 40 % (ref 38.5–50.0)
Hemoglobin: 13.9 g/dL (ref 13.2–17.1)
LYMPHS ABS: 1207 {cells}/uL (ref 850–3900)
MCH: 32 pg (ref 27.0–33.0)
MCHC: 34.8 g/dL (ref 32.0–36.0)
MCV: 92 fL (ref 80.0–100.0)
MPV: 10.7 fL (ref 7.5–12.5)
Monocytes Relative: 7 %
NEUTROS PCT: 75 %
Neutro Abs: 6075 cells/uL (ref 1500–7800)
Platelets: 137 10*3/uL — ABNORMAL LOW (ref 140–400)
RBC: 4.35 10*6/uL (ref 4.20–5.80)
RDW: 14 % (ref 11.0–15.0)
Total Lymphocyte: 14.9 %
WBC mixed population: 567 cells/uL (ref 200–950)
WBC: 8.1 10*3/uL (ref 3.8–10.8)

## 2017-02-27 LAB — LIPID PANEL
Cholesterol: 141 mg/dL (ref <200)
HDL: 50 mg/dL (ref >40)
LDL Cholesterol (Calc): 78 mg/dL (calc)
NON-HDL CHOLESTEROL (CALC): 91 mg/dL (ref <130)
Total CHOL/HDL Ratio: 2.8 (calc) (ref <5.0)
Triglycerides: 58 mg/dL (ref <150)

## 2017-02-27 LAB — HEPATIC FUNCTION PANEL
AG Ratio: 1.6 (calc) (ref 1.0–2.5)
ALKALINE PHOSPHATASE (APISO): 59 U/L (ref 40–115)
ALT: 12 U/L (ref 9–46)
AST: 15 U/L (ref 10–35)
Albumin: 4.1 g/dL (ref 3.6–5.1)
BILIRUBIN DIRECT: 0.2 mg/dL (ref 0.0–0.2)
BILIRUBIN TOTAL: 1 mg/dL (ref 0.2–1.2)
Globulin: 2.6 g/dL (calc) (ref 1.9–3.7)
Indirect Bilirubin: 0.8 mg/dL (calc) (ref 0.2–1.2)
Total Protein: 6.7 g/dL (ref 6.1–8.1)

## 2017-02-27 LAB — URINE CULTURE
MICRO NUMBER:: 80952995
Result:: NO GROWTH
SPECIMEN QUALITY:: ADEQUATE

## 2017-02-27 LAB — BASIC METABOLIC PANEL WITH GFR
BUN: 16 mg/dL (ref 7–25)
CHLORIDE: 104 mmol/L (ref 98–110)
CO2: 29 mmol/L (ref 20–32)
Calcium: 9.9 mg/dL (ref 8.6–10.3)
Creat: 1.02 mg/dL (ref 0.70–1.18)
GFR, EST AFRICAN AMERICAN: 82 mL/min/{1.73_m2} (ref >=60)
GFR, EST NON AFRICAN AMERICAN: 71 mL/min/{1.73_m2} (ref >=60)
Glucose, Bld: 87 mg/dL (ref 65–99)
POTASSIUM: 4.2 mmol/L (ref 3.5–5.3)
SODIUM: 141 mmol/L (ref 135–146)

## 2017-02-27 LAB — URINALYSIS, ROUTINE W REFLEX MICROSCOPIC
Bilirubin Urine: NEGATIVE
GLUCOSE, UA: NEGATIVE
HGB URINE DIPSTICK: NEGATIVE
Ketones, ur: NEGATIVE
LEUKOCYTES UA: NEGATIVE
NITRITE: NEGATIVE
Protein, ur: NEGATIVE
Specific Gravity, Urine: 1.02 (ref 1.001–1.03)
pH: 7 (ref 5.0–8.0)

## 2017-02-27 LAB — MICROALBUMIN / CREATININE URINE RATIO
Creatinine, Urine: 136 mg/dL (ref 20–370)
MICROALB UR: 2.5 mg/dL
MICROALB/CREAT RATIO: 18 ug/mg{creat} (ref <30)

## 2017-02-27 LAB — MAGNESIUM: MAGNESIUM: 2.1 mg/dL (ref 1.5–2.5)

## 2017-02-27 LAB — TSH: TSH: 3.1 mIU/L (ref 0.40–4.50)

## 2017-03-03 ENCOUNTER — Encounter: Payer: Self-pay | Admitting: Radiation Oncology

## 2017-03-03 DIAGNOSIS — C61 Malignant neoplasm of prostate: Secondary | ICD-10-CM | POA: Insufficient documentation

## 2017-03-03 NOTE — Progress Notes (Signed)
LVM for pt to return office call for LAB results.

## 2017-03-03 NOTE — Progress Notes (Signed)
GU Location of Tumor / Histology: prostatic adenocarcinoma   If Prostate Cancer, Gleason Score is (4 + 5) and PSA is (7.99). Prostate volume: 36 cc.   Dyllon Henken had a PSA of 4.96 in 2016. Patient had a NEGATIVE prostate biopsy. Then, 08/2016 prostate MRI fusion biopsy was preformed.  Biopsies of prostate (if applicable) revealed:    Past/Anticipated interventions by urology, if any: biopsy, MRI fusion biopsy, CT, bone scan, referral to radiation oncology  Past/Anticipated interventions by medical oncology, if any: no  Weight changes, if any: Reports last year he lost down from 182 to 150 lb. Reports his weight has since stabilized.  Bowel/Bladder complaints, if any: Ran out of alfuzosin six weeks ago. Reports nocturia x 1 instead of 3-5 with alfuzosin. Reports urinary frequency, nocturia and leakage. Denies dysuria or hematuria.   Nausea/Vomiting, if any: no  Pain issues, if any:  no  SAFETY ISSUES:  Prior radiation? no  Pacemaker/ICD? no  Possible current pregnancy? no  Is the patient on methotrexate? no  Current Complaints / other details:  76 year old male. Married. Referred to discuss primary radiation (likely bachy + external + hormones). Accompanied by wife and daughter. Mild dementia.

## 2017-03-03 NOTE — Progress Notes (Signed)
Pt aware of lab results & voiced understanding of those results.

## 2017-03-04 ENCOUNTER — Other Ambulatory Visit: Payer: Self-pay | Admitting: Internal Medicine

## 2017-03-04 ENCOUNTER — Encounter: Payer: Self-pay | Admitting: Radiation Oncology

## 2017-03-04 ENCOUNTER — Ambulatory Visit
Admission: RE | Admit: 2017-03-04 | Discharge: 2017-03-04 | Disposition: A | Discharge status: Home / Self Care | Payer: Medicare Other | Source: Ambulatory Visit | Attending: Radiation Oncology | Admitting: Radiation Oncology

## 2017-03-04 DIAGNOSIS — R972 Elevated prostate specific antigen [PSA]: Secondary | ICD-10-CM | POA: Diagnosis not present

## 2017-03-04 DIAGNOSIS — K589 Irritable bowel syndrome without diarrhea: Secondary | ICD-10-CM | POA: Insufficient documentation

## 2017-03-04 DIAGNOSIS — Z95820 Peripheral vascular angioplasty status with implants and grafts: Secondary | ICD-10-CM | POA: Diagnosis not present

## 2017-03-04 DIAGNOSIS — G4733 Obstructive sleep apnea (adult) (pediatric): Secondary | ICD-10-CM | POA: Diagnosis not present

## 2017-03-04 DIAGNOSIS — Z7902 Long term (current) use of antithrombotics/antiplatelets: Secondary | ICD-10-CM | POA: Insufficient documentation

## 2017-03-04 DIAGNOSIS — C61 Malignant neoplasm of prostate: Secondary | ICD-10-CM | POA: Diagnosis not present

## 2017-03-04 DIAGNOSIS — Z880 Allergy status to penicillin: Secondary | ICD-10-CM | POA: Diagnosis not present

## 2017-03-04 DIAGNOSIS — Z7982 Long term (current) use of aspirin: Secondary | ICD-10-CM | POA: Diagnosis not present

## 2017-03-04 DIAGNOSIS — Z7901 Long term (current) use of anticoagulants: Secondary | ICD-10-CM | POA: Diagnosis not present

## 2017-03-04 DIAGNOSIS — Z510 Encounter for antineoplastic radiation therapy: Secondary | ICD-10-CM | POA: Diagnosis not present

## 2017-03-04 DIAGNOSIS — F0390 Unspecified dementia without behavioral disturbance: Secondary | ICD-10-CM | POA: Diagnosis not present

## 2017-03-04 DIAGNOSIS — I739 Peripheral vascular disease, unspecified: Secondary | ICD-10-CM | POA: Diagnosis not present

## 2017-03-04 DIAGNOSIS — Z87891 Personal history of nicotine dependence: Secondary | ICD-10-CM | POA: Diagnosis not present

## 2017-03-04 DIAGNOSIS — E785 Hyperlipidemia, unspecified: Secondary | ICD-10-CM | POA: Insufficient documentation

## 2017-03-04 DIAGNOSIS — J449 Chronic obstructive pulmonary disease, unspecified: Secondary | ICD-10-CM | POA: Diagnosis not present

## 2017-03-04 DIAGNOSIS — G2581 Restless legs syndrome: Secondary | ICD-10-CM | POA: Diagnosis not present

## 2017-03-04 HISTORY — DX: Unspecified dementia, mild, without behavioral disturbance, psychotic disturbance, mood disturbance, and anxiety: F03.A0

## 2017-03-04 HISTORY — DX: Unspecified dementia without behavioral disturbance: F03.90

## 2017-03-04 HISTORY — DX: Malignant neoplasm of prostate: C61

## 2017-03-04 NOTE — Progress Notes (Signed)
Radiation Oncology         [phone] ________________________________  Initial Outpatient Consultation  Name: Robert Schaefer MRN: [id number]  Date: 03/04/2017  DOB: [birth year]  CC:[name], MD  [name], MD   REFERRING PHYSICIAN: [name], MD  DIAGNOSIS: 76 y.o. gentleman with Stage T1c adenocarcinoma of the prostate with Gleason Score of 4+5 and PSA of 8.15    ICD-10-CM   1. Malignant neoplasm of prostate [facility]) [place] [facility] referral to Social Work    HISTORY OF PRESENT ILLNESS: Robert Schaefer is a 76 y.o. male with a diagnosis of prostate cancer. He is a well established patient at [facility] previously followed by Dr. Gaynelle Arabian for elevated PSA with a normal DRE, non-nodular prostate.  He had a negative prostate biopsy with Dr. [name] in May 2017 with a PSA of 7.61 at that time. Prostate volume was 27 gm.  The PSA increased to 8.15 in March 2018 prompting an MRI prostate for further evaluation. This revealed a 1.2 cm PI-RADS 4 lesion in the left mid lateral and an 8 mm PI-RADS 3 lesion in the right lateral apex. The patient proceeded to MRI fusion transrectal ultrasound with 12 biopsies of the prostate on 09/22/2016.  The prostate volume measured 36 cc.  Out of 12 core biopsies, 5 were positive.  The maximum Gleason score was 4+5, and this was seen in the MRI fusion ROI sample.  There was also Gleason 4+4=8 in two additional MRI fusion ROI samples as well as the left mid lateral and left apex lateral cores.   Biopsies of prostate revealed:   PSA History: 10/2014: 4.96 11/2015: 7.61 08/2016: 8.15 01/2017: 7.99  CT abdomen and pelvis and bone scan for disease staging were performed on 10/09/2016 and revealed no evidence of metastatic disease in the skeleton, abdomen, or pelvis.  He initially opted for close surveillance despite quite aggressive disease and very little cardiovascular comorbidity.  His most recent PSA was 7.99 on 02/10/2017.  The patient  reviewed the biopsy results with his urologist, Dr. [name], and he has kindly been referred today for discussion of potential radiation treatment options. He is accompanied by his wife and daughter.   Of note, the patient has a h/o PVD with stents in both legs.  He is on daily Plavix.  PMH is also significant for COPD/emphysema, sleep apnea and mild dementia. He is followed by Dr. [name] for his pulmonary disease and had a recent follow-up CT chest scan for continued surveillance of a cavitary lesion in the left upper lobe lung.  This showed continued contraction of the left upper lobe lung lesion, felt most likely to be postinfectious scarring. There was stable emphysematous changes and pulmonary scarring along with right upper lobe calcified granuloma and calcified mediastinal andhilar lymph nodes.  No new/acute pulmonary findings or new pulmonary lesions.   PREVIOUS RADIATION THERAPY: No  PAST MEDICAL HISTORY:  Past Medical History:  Diagnosis Date  . Asthma   . Atherosclerosis of aorta ([place])    via CXR  . COPD (chronic obstructive pulmonary disease) ([place])   . Hyperlipidemia   . IBS (irritable bowel syndrome)   . Mild dementia   . OSA (obstructive sleep apnea)    has a cpap-canot use it  . Prostate cancer ([place])   . RLS (restless legs syndrome)       PAST SURGICAL HISTORY: Past Surgical History:  Procedure Laterality Date  . CATARACT EXTRACTION W/ INTRAOCULAR LENS  IMPLANT, BILATERAL Bilateral   . COLONOSCOPY    .  ESOPHAGOGASTRODUODENOSCOPY (EGD) WITH ESOPHAGEAL DILATION  "multiple times"  . HERNIA REPAIR    . ILIAC ARTERY STENT Bilateral 05/29/2016  . INGUINAL HERNIA REPAIR Right 02/23/2014   Procedure: HERNIA REPAIR INGUINAL ADULT;  Surgeon: Joyice Faster. Cornett, MD;  Location: [place];  Service: General;  Laterality: Right;  . INSERTION OF MESH Right 02/23/2014   Procedure: INSERTION OF MESH;  Surgeon: Joyice Faster. Cornett, MD;  Location: [place];   Service: General;  Laterality: Right;  . PERIPHERAL VASCULAR CATHETERIZATION N/A 05/29/2016   Procedure: Lower Extremity Angiography;  Surgeon: [name], MD;  Location: [place] [facility];  Service: Cardiovascular;  Laterality: N/A;  . PILONIDAL CYST / SINUS EXCISION  2000   spine  . PROSTATE BIOPSY    . UPPER GI ENDOSCOPY      FAMILY HISTORY:  Family History  Problem Relation Age of Onset  . Heart attack Mother   . Stroke Father   . Asthma Father   . Cancer Neg Hx     SOCIAL HISTORY:  Social History   Social History  . Marital status: Married    Spouse name: N/A  . Number of children: N/A  . Years of education: N/A   Occupational History  . Not on file.   Social History Main Topics  . Smoking status: Former Smoker    Packs/day: 1.00    Years: 50.00    Types: Cigarettes    Quit date: 07/05/2005  . Smokeless tobacco: Never Used  . Alcohol use No  . Drug use: No  . Sexual activity: No   Other Topics Concern  . Not on file   Social History Narrative  . No narrative on file    ALLERGIES: Penicillins; Sulfa antibiotics; and Versed [midazolam]  MEDICATIONS:  Current Outpatient Prescriptions  Medication Sig Dispense Refill  . aspirin EC 81 MG tablet Take 81 mg by mouth daily.    . Cholecalciferol (VITAMIN D PO) Take 7,000 Units by mouth daily.    . clopidogrel (PLAVIX) 75 MG tablet Take 1 tablet (75 mg total) by mouth daily with breakfast. 30 tablet 11  . fluticasone (FLONASE) 50 MCG/ACT nasal spray Place 2 sprays into both nostrils daily. 16 g 3  . levothyroxine (SYNTHROID, LEVOTHROID) 50 MCG tablet Take 1 tablet (50 mcg total) by mouth daily. 90 tablet 1  . simvastatin (ZOCOR) 20 MG tablet Take 7 mg by mouth every other day. At night    . albuterol (PROVENTIL HFA;VENTOLIN HFA) 108 (90 Base) MCG/ACT inhaler Inhale 2 puffs into the lungs every 6 (six) hours as needed for wheezing or shortness of breath. (Patient not taking: Reported on 03/04/2017) 1 Inhaler 6    . alfuzosin (UROXATRAL) 10 MG 24 hr tablet Take 10 mg by mouth at bedtime.     [name] losartan (COZAAR) 50 MG tablet Take 1 tablet (50 mg total) by mouth daily. (Patient not taking: Reported on 03/04/2017) 30 tablet 5  . simvastatin (ZOCOR) 20 MG tablet TAKE ONE TABLET BY MOUTH ONCE DAILY 90 tablet 1   No current facility-administered medications for this encounter.     REVIEW OF SYSTEMS:  On review of systems, the patient reports that he is doing well overall. He denies any chest pain, shortness of breath, cough, fevers, chills, night sweats, or unintended weight changes. He denies any bowel disturbances, and denies abdominal pain, nausea or vomiting. He denies any new musculoskeletal or joint aches or pains. His IPSS was 17, indicating moderate  urinary symptoms, including urgency, weak stream, feelings of incomplete emptying, urinary frequency, nocturia x1, and leakage. He denies dysuria or hematuria. He is able to complete sexual activity with all attempts. He has a h/o CAD and PVD and has stents in both legs.  A complete review of systems is obtained and is otherwise negative.    PHYSICAL EXAM:  Wt Readings from Last 3 Encounters:  03/04/17 151 lb 6.4 oz (68.7 kg)  02/26/17 151 lb 9.6 oz (68.8 kg)  01/09/17 156 lb 3.2 oz (70.9 kg)   Temp Readings from Last 3 Encounters:  03/04/17 98.1 F (36.7 C) (Oral)  02/26/17 97.7 F (36.5 C)  09/15/16 97.5 F (36.4 C)   BP Readings from Last 3 Encounters:  03/04/17 (!) 167/64  02/26/17 118/66  01/09/17 116/78   Pulse Readings from Last 3 Encounters:  03/04/17 67  02/26/17 70  01/09/17 80   Pain Assessment Pain Score: 0-No pain/10  In general this is a well appearing Caucasian male in no acute distress. He is alert and oriented x4 and appropriate throughout the examination. Cardiovascular exam reveals a regular rate and rhythm, no clicks or rubs are auscultated. There is a III/VI murmur heard. Chest is clear to auscultation bilaterally.  Lymphatic assessment is performed and does not reveal any adenopathy in the cervical, supraclavicular, axillary, or inguinal chains. Abdomen has active bowel sounds in all quadrants and is intact. The abdomen is soft, non tender, non distended. Lower extremities are negative for pretibial pitting edema, deep calf tenderness, cyanosis or clubbing.  KPS = 100  100 - Normal; no complaints; no evidence of disease. 90   - Able to carry on normal activity; minor signs or symptoms of disease. 80   - Normal activity with effort; some signs or symptoms of disease. 29   - Cares for self; unable to carry on normal activity or to do active work. 60   - Requires occasional assistance, but is able to care for most of his personal needs. 50   - Requires considerable assistance and frequent medical care. 70   - Disabled; requires special care and assistance. 47   - Severely disabled; hospital admission is indicated although death not imminent. 30   - Very sick; hospital admission necessary; active supportive treatment necessary. 10   - Moribund; fatal processes progressing rapidly. 0     - Dead  Karnofsky DA, Abelmann [place], Craver LS and Burchenal [facility] [phone]) The use of the nitrogen mustards in the palliative treatment of carcinoma: with particular reference to bronchogenic carcinoma Cancer 1 [id number]  LABORATORY DATA:  Lab Results  Component Value Date   WBC 8.1 02/26/2017   HGB 13.9 02/26/2017   HCT 40.0 02/26/2017   MCV 92.0 02/26/2017   PLT 137 (L) 02/26/2017   Lab Results  Component Value Date   NA 141 02/26/2017   K 4.2 02/26/2017   CL 104 02/26/2017   CO2 29 02/26/2017   Lab Results  Component Value Date   ALT 12 02/26/2017   AST 15 02/26/2017   ALKPHOS 51 09/15/2016   BILITOT 1.0 02/26/2017     RADIOGRAPHY: No results found.    IMPRESSION/PLAN: 1. 76 y.o. gentleman with Stage T1c adenocarcinoma of the prostate with Gleason Score of 4+5, and PSA of 8.15. We discussed the patient's  workup and outlined the nature of prostate cancer in this setting. The patient's Gleason's score puts him into the high risk group. Accordingly, he is eligible for a variety of  potential treatment options including long term androgen deprivation therapy (LT-ADT) plus radiation either as 8 weeks of external radiation or 5 weeks of external radiation followed by a brachytherapy boost. We discussed the available radiation techniques, and focused on the details and logistics and delivery. He is not an ideal candidate for brachytherapy due to his pronounced obstructive urinary symptoms which place him at an increased risk for developing AUR post procedure and/or decreased quality of life due to the exacerbation of his LUTS.  He also has a h/o PVD requiring daily Plavix which would need to be held preoperatively. We therefore feel that it may be appropriate to consider LT-ADT in combination with 8 weeks of EBRT.  We discussed and outlined the risks, benefits, short and long-term effects associated with radiotherapy and compared and contrasted these with prostatectomy. We also detailed the role of ADT in the treatment of high risk prostate cancer and outlined the associated side effects that could be expected with this therapy.  He can likely anticipate a total of 2 years on ADT.  At the end of our conversation, the patient is interested in proceeding with LT-ADT in combination with 8 weeks of EBRT.  We will share our findings with Dr. [name] and move forward with scheduling a follow up appointment in his office for initiation of ADT.  We anticipate beginning external beam radiotherapy approximately 8 weeks after the start of ADT and will schedule CT simulation to occur prior to the start of treatment.   We spent 60 minutes face to face with the patient and more than 50% of that time was spent in counseling and/or coordination of care.   Nicholos Johns, PA-C    Tyler Pita, MD  [place]  Oncology Direct Dial: [phone]  Fax: [phone] [email or web address]  Skype  LinkedIn  This document serves as a record of services personally performed by Tyler Pita, MD and Freeman Caldron, PA-C. It was created on their behalf by Rae Lips, a trained medical scribe. The creation of this record is based on the scribe's personal observations and the providers' statements to them. This document has been checked and approved by the attending providers.

## 2017-03-04 NOTE — Progress Notes (Signed)
See progress note under physician encounter. 

## 2017-03-05 ENCOUNTER — Telehealth: Payer: Self-pay | Admitting: Medical Oncology

## 2017-03-05 ENCOUNTER — Telehealth: Payer: Self-pay | Admitting: *Deleted

## 2017-03-05 NOTE — Telephone Encounter (Signed)
Spoke with Robert Schaefer to introduce myself as the prostate nurse navigator. I was unable to meet him yesterday when he consulted with Dr. [name]. He has been scheduled for ADT 03/12/17 at [facility] and then he will begin radiation in the fall. I ask him to call with questions or concerns and he voiced understanding.

## 2017-03-05 NOTE — Telephone Encounter (Signed)
Called patient to inform of appt. for ADT  on 03-12-17- arrival time - 11:00 am @ Dr. Zettie Pho office, spoke with patient and he is aware of this appt.

## 2017-03-09 ENCOUNTER — Other Ambulatory Visit: Payer: Self-pay | Admitting: Internal Medicine

## 2017-03-09 ENCOUNTER — Other Ambulatory Visit: Payer: Self-pay | Admitting: Urology

## 2017-03-09 DIAGNOSIS — C61 Malignant neoplasm of prostate: Secondary | ICD-10-CM

## 2017-03-12 ENCOUNTER — Ambulatory Visit (HOSPITAL_COMMUNITY)
Admission: RE | Admit: 2017-03-12 | Payer: Medicare Other | Source: Ambulatory Visit | Attending: Cardiovascular Disease | Admitting: Cardiovascular Disease

## 2017-03-12 ENCOUNTER — Encounter (HOSPITAL_COMMUNITY): Payer: Medicare Other

## 2017-03-12 DIAGNOSIS — C61 Malignant neoplasm of prostate: Secondary | ICD-10-CM | POA: Diagnosis not present

## 2017-03-13 ENCOUNTER — Encounter: Payer: Self-pay | Admitting: Urology

## 2017-03-13 NOTE — Progress Notes (Signed)
Patient started ADT 03/12/17 and is scheduled for gold seed placement on 04/29/17 with Dr. [name].  He has CT SIM scheduled 05/01/17.

## 2017-03-17 ENCOUNTER — Ambulatory Visit: Payer: Medicare Other | Admitting: Cardiovascular Disease

## 2017-03-27 DIAGNOSIS — Z5111 Encounter for antineoplastic chemotherapy: Secondary | ICD-10-CM | POA: Diagnosis not present

## 2017-03-27 DIAGNOSIS — C61 Malignant neoplasm of prostate: Secondary | ICD-10-CM | POA: Diagnosis not present

## 2017-04-03 ENCOUNTER — Encounter: Payer: Self-pay | Admitting: *Deleted

## 2017-04-03 NOTE — Progress Notes (Signed)
[place] Psychosocial Distress Screening Clinical Social Work  Clinical Social Work was referred by distress screening protocol.  The patient scored a 8 on the Psychosocial Distress Thermometer which indicates severe distress. Clinical Social Worker contacted patient to assess for distress and other psychosocial needs. Robert Schaefer reported he is longer having concerns he listed on the distress screen form.  His only concern is "getting well" and plans to proceed with treatment later this month.  CSW encouraged patient to call with any questions or concerns.  ONCBCN DISTRESS SCREENING 03/04/2017  Screening Type Initial Screening  Distress experienced in past week (1-10) 8  Emotional problem type Adjusting to illness  Spiritual/Religous concerns type Facing my mortality  Information Concerns Type Lack of info about treatment  Physical Problem type Pain;Constipation/diarrhea  Physician notified of physical symptoms Yes  Referral to clinical psychology No  Referral to clinical social work No  Referral to dietition No  Referral to financial advocate No  Referral to support programs No  Referral to palliative care No    [place], MSW, LCSW, OSW-C Clinical Social Worker [place] [phone]

## 2017-04-07 DIAGNOSIS — L988 Other specified disorders of the skin and subcutaneous tissue: Secondary | ICD-10-CM | POA: Diagnosis not present

## 2017-04-07 DIAGNOSIS — D485 Neoplasm of uncertain behavior of skin: Secondary | ICD-10-CM | POA: Diagnosis not present

## 2017-04-14 DIAGNOSIS — R351 Nocturia: Secondary | ICD-10-CM | POA: Diagnosis not present

## 2017-04-14 DIAGNOSIS — C61 Malignant neoplasm of prostate: Secondary | ICD-10-CM | POA: Diagnosis not present

## 2017-04-20 ENCOUNTER — Encounter (HOSPITAL_BASED_OUTPATIENT_CLINIC_OR_DEPARTMENT_OTHER): Payer: Self-pay | Admitting: *Deleted

## 2017-04-21 ENCOUNTER — Encounter (HOSPITAL_COMMUNITY): Payer: Self-pay | Admitting: Cardiovascular Disease

## 2017-04-21 ENCOUNTER — Encounter (HOSPITAL_BASED_OUTPATIENT_CLINIC_OR_DEPARTMENT_OTHER): Payer: Self-pay | Admitting: *Deleted

## 2017-04-21 NOTE — Progress Notes (Addendum)
NPO AFTER MN.  ARRIVE AT 0630.  NEEDS ISTAT 8.  CURRENT EKG IN CHART AND EPIC.  WILL TAKE SYNTHROID AM DOS W/ SIPS OF WATER AND DO FLEET ENEMA.  PER PT WAS TOLD BY DR [facility] OFFICE TO STOP PLAVIX A WEEK BEFORE SURGERY .  CALLED AND LM FOR SELITA, OR SCHEDULER , TO FAX CLEARANCE.

## 2017-04-27 ENCOUNTER — Emergency Department (HOSPITAL_COMMUNITY): Payer: Medicare Other

## 2017-04-27 ENCOUNTER — Observation Stay (HOSPITAL_COMMUNITY)
Admission: EM | Admit: 2017-04-27 | Discharge: 2017-04-28 | Disposition: A | Discharge status: Home / Self Care | Payer: Medicare Other | Attending: Internal Medicine | Admitting: Internal Medicine

## 2017-04-27 ENCOUNTER — Encounter (HOSPITAL_COMMUNITY): Payer: Self-pay

## 2017-04-27 DIAGNOSIS — R55 Syncope and collapse: Secondary | ICD-10-CM | POA: Diagnosis not present

## 2017-04-27 DIAGNOSIS — Z888 Allergy status to other drugs, medicaments and biological substances status: Secondary | ICD-10-CM | POA: Insufficient documentation

## 2017-04-27 DIAGNOSIS — I440 Atrioventricular block, first degree: Secondary | ICD-10-CM | POA: Insufficient documentation

## 2017-04-27 DIAGNOSIS — Z955 Presence of coronary angioplasty implant and graft: Secondary | ICD-10-CM | POA: Insufficient documentation

## 2017-04-27 DIAGNOSIS — F0390 Unspecified dementia without behavioral disturbance: Secondary | ICD-10-CM | POA: Diagnosis not present

## 2017-04-27 DIAGNOSIS — I352 Nonrheumatic aortic (valve) stenosis with insufficiency: Secondary | ICD-10-CM | POA: Diagnosis not present

## 2017-04-27 DIAGNOSIS — Z880 Allergy status to penicillin: Secondary | ICD-10-CM | POA: Insufficient documentation

## 2017-04-27 DIAGNOSIS — I714 Abdominal aortic aneurysm, without rupture: Secondary | ICD-10-CM | POA: Diagnosis not present

## 2017-04-27 DIAGNOSIS — I351 Nonrheumatic aortic (valve) insufficiency: Secondary | ICD-10-CM | POA: Diagnosis not present

## 2017-04-27 DIAGNOSIS — K219 Gastro-esophageal reflux disease without esophagitis: Secondary | ICD-10-CM | POA: Insufficient documentation

## 2017-04-27 DIAGNOSIS — I350 Nonrheumatic aortic (valve) stenosis: Secondary | ICD-10-CM | POA: Diagnosis not present

## 2017-04-27 DIAGNOSIS — I10 Essential (primary) hypertension: Secondary | ICD-10-CM | POA: Insufficient documentation

## 2017-04-27 DIAGNOSIS — E039 Hypothyroidism, unspecified: Secondary | ICD-10-CM | POA: Insufficient documentation

## 2017-04-27 DIAGNOSIS — K449 Diaphragmatic hernia without obstruction or gangrene: Secondary | ICD-10-CM | POA: Diagnosis not present

## 2017-04-27 DIAGNOSIS — I6523 Occlusion and stenosis of bilateral carotid arteries: Secondary | ICD-10-CM | POA: Insufficient documentation

## 2017-04-27 DIAGNOSIS — Z79899 Other long term (current) drug therapy: Secondary | ICD-10-CM | POA: Insufficient documentation

## 2017-04-27 DIAGNOSIS — Z7901 Long term (current) use of anticoagulants: Secondary | ICD-10-CM | POA: Insufficient documentation

## 2017-04-27 DIAGNOSIS — I739 Peripheral vascular disease, unspecified: Secondary | ICD-10-CM | POA: Insufficient documentation

## 2017-04-27 DIAGNOSIS — R011 Cardiac murmur, unspecified: Secondary | ICD-10-CM | POA: Diagnosis not present

## 2017-04-27 DIAGNOSIS — I2510 Atherosclerotic heart disease of native coronary artery without angina pectoris: Secondary | ICD-10-CM | POA: Insufficient documentation

## 2017-04-27 DIAGNOSIS — R0600 Dyspnea, unspecified: Secondary | ICD-10-CM | POA: Insufficient documentation

## 2017-04-27 DIAGNOSIS — J449 Chronic obstructive pulmonary disease, unspecified: Secondary | ICD-10-CM | POA: Diagnosis present

## 2017-04-27 DIAGNOSIS — Z9842 Cataract extraction status, left eye: Secondary | ICD-10-CM | POA: Insufficient documentation

## 2017-04-27 DIAGNOSIS — H9190 Unspecified hearing loss, unspecified ear: Secondary | ICD-10-CM | POA: Diagnosis not present

## 2017-04-27 DIAGNOSIS — I700 Atherosclerosis of aorta: Secondary | ICD-10-CM | POA: Insufficient documentation

## 2017-04-27 DIAGNOSIS — E559 Vitamin D deficiency, unspecified: Secondary | ICD-10-CM | POA: Insufficient documentation

## 2017-04-27 DIAGNOSIS — K589 Irritable bowel syndrome without diarrhea: Secondary | ICD-10-CM | POA: Diagnosis not present

## 2017-04-27 DIAGNOSIS — Z7902 Long term (current) use of antithrombotics/antiplatelets: Secondary | ICD-10-CM | POA: Insufficient documentation

## 2017-04-27 DIAGNOSIS — J439 Emphysema, unspecified: Secondary | ICD-10-CM | POA: Diagnosis not present

## 2017-04-27 DIAGNOSIS — E785 Hyperlipidemia, unspecified: Secondary | ICD-10-CM | POA: Insufficient documentation

## 2017-04-27 DIAGNOSIS — G2581 Restless legs syndrome: Secondary | ICD-10-CM | POA: Insufficient documentation

## 2017-04-27 DIAGNOSIS — Z87891 Personal history of nicotine dependence: Secondary | ICD-10-CM | POA: Insufficient documentation

## 2017-04-27 DIAGNOSIS — R7303 Prediabetes: Secondary | ICD-10-CM | POA: Insufficient documentation

## 2017-04-27 DIAGNOSIS — C61 Malignant neoplasm of prostate: Secondary | ICD-10-CM | POA: Insufficient documentation

## 2017-04-27 DIAGNOSIS — R1310 Dysphagia, unspecified: Secondary | ICD-10-CM | POA: Insufficient documentation

## 2017-04-27 DIAGNOSIS — G4733 Obstructive sleep apnea (adult) (pediatric): Secondary | ICD-10-CM | POA: Insufficient documentation

## 2017-04-27 DIAGNOSIS — N401 Enlarged prostate with lower urinary tract symptoms: Secondary | ICD-10-CM | POA: Insufficient documentation

## 2017-04-27 DIAGNOSIS — Z7982 Long term (current) use of aspirin: Secondary | ICD-10-CM | POA: Insufficient documentation

## 2017-04-27 DIAGNOSIS — Z882 Allergy status to sulfonamides status: Secondary | ICD-10-CM | POA: Insufficient documentation

## 2017-04-27 DIAGNOSIS — M351 Other overlap syndromes: Secondary | ICD-10-CM | POA: Insufficient documentation

## 2017-04-27 DIAGNOSIS — Z9841 Cataract extraction status, right eye: Secondary | ICD-10-CM | POA: Insufficient documentation

## 2017-04-27 LAB — LIPASE, BLOOD: Lipase: 32 U/L (ref 11–51)

## 2017-04-27 LAB — COMPREHENSIVE METABOLIC PANEL
ALK PHOS: 61 U/L (ref 38–126)
ALT: 15 U/L — AB (ref 17–63)
AST: 19 U/L (ref 15–41)
Albumin: 4.2 g/dL (ref 3.5–5.0)
Anion gap: 9 (ref 5–15)
BUN: 19 mg/dL (ref 6–20)
CALCIUM: 9.9 mg/dL (ref 8.9–10.3)
CHLORIDE: 101 mmol/L (ref 101–111)
CO2: 28 mmol/L (ref 22–32)
CREATININE: 1.02 mg/dL (ref 0.61–1.24)
Glucose, Bld: 109 mg/dL — ABNORMAL HIGH (ref 65–99)
Potassium: 4.3 mmol/L (ref 3.5–5.1)
Sodium: 138 mmol/L (ref 135–145)
Total Bilirubin: 0.9 mg/dL (ref 0.3–1.2)
Total Protein: 7.6 g/dL (ref 6.5–8.1)

## 2017-04-27 LAB — CBC WITH DIFFERENTIAL/PLATELET
BASOS ABS: 0.1 10*3/uL (ref 0.0–0.1)
Basophils Relative: 1 %
Eosinophils Absolute: 0.3 10*3/uL (ref 0.0–0.7)
Eosinophils Relative: 3 %
HEMATOCRIT: 40.2 % (ref 39.0–52.0)
HEMOGLOBIN: 14.3 g/dL (ref 13.0–17.0)
LYMPHS ABS: 1.7 10*3/uL (ref 0.7–4.0)
LYMPHS PCT: 23 %
MCH: 33.4 pg (ref 26.0–34.0)
MCHC: 35.6 g/dL (ref 30.0–36.0)
MCV: 93.9 fL (ref 78.0–100.0)
Monocytes Absolute: 0.5 10*3/uL (ref 0.1–1.0)
Monocytes Relative: 6 %
NEUTROS ABS: 5 10*3/uL (ref 1.7–7.7)
NEUTROS PCT: 67 %
Platelets: 139 10*3/uL — ABNORMAL LOW (ref 150–400)
RBC: 4.28 MIL/uL (ref 4.22–5.81)
RDW: 14 % (ref 11.5–15.5)
WBC: 7.5 10*3/uL (ref 4.0–10.5)

## 2017-04-27 LAB — TROPONIN I
Troponin I: <0.03 ng/mL (ref <0.03)
Troponin I: <0.03 ng/mL (ref <0.03)

## 2017-04-27 LAB — I-STAT TROPONIN, ED
TROPONIN I, POC: 0 ng/mL (ref 0.00–0.08)
Troponin i, poc: 0.01 ng/mL (ref 0.00–0.08)

## 2017-04-27 MED ORDER — LEVOTHYROXINE SODIUM 50 MCG PO TABS
50.0000 ug | ORAL_TABLET | Freq: Every day | ORAL | Status: DC
  Administered 2017-04-28: 50 ug via ORAL
  Filled 2017-04-27: qty 1

## 2017-04-27 MED ORDER — SODIUM CHLORIDE 0.9 % IV BOLUS (SEPSIS)
1000.0000 mL | Freq: Once | INTRAVENOUS | Status: AC
  Administered 2017-04-27: 1000 mL via INTRAVENOUS

## 2017-04-27 MED ORDER — ONDANSETRON HCL 4 MG/2ML IJ SOLN: 4.0000 mg | Freq: Four times a day (QID) | INTRAMUSCULAR | Status: DC | PRN

## 2017-04-27 MED ORDER — ACETAMINOPHEN 325 MG PO TABS: 650.0000 mg | ORAL_TABLET | ORAL | Status: DC | PRN

## 2017-04-27 MED ORDER — MORPHINE SULFATE (PF) 4 MG/ML IV SOLN: 2.0000 mg | INTRAVENOUS | Status: DC | PRN

## 2017-04-27 MED ORDER — ALBUTEROL SULFATE (2.5 MG/3ML) 0.083% IN NEBU: 2.5000 mg | INHALATION_SOLUTION | RESPIRATORY_TRACT | Status: DC | PRN

## 2017-04-27 MED ORDER — HYDRALAZINE HCL 20 MG/ML IJ SOLN: 10.0000 mg | Freq: Three times a day (TID) | INTRAMUSCULAR | Status: DC | PRN

## 2017-04-27 NOTE — ED Provider Notes (Signed)
[facility] DEPT Provider Note   CSN: [id number] Arrival date & time: 04/27/17  3016     History   Chief Complaint Chief Complaint  Patient presents with  . Near Syncope  . Nausea    HPI Robert Schaefer is a 76 y.o. male history of AAA, COPD, first-degree heart block, peripheral artery disease with stents on plavix, prostate cancer here presenting with near syncope. Patient states that his appetite has been poor for the last several days. He went to his dentist to put a crown today and was walking and felt lightheaded and dizzy like he is on pass out. Denies any chest pain or shortness of breath. He sat down and had no head injury. Denies fevers or chills or vomiting. Patient does have prostate cancer and is scheduled for radiation seed implants in 2 days.  The history is provided by the patient.    Past Medical History:  Diagnosis Date  . AAA (abdominal aortic aneurysm) ([place])    found by berry at periphral vascular catherization w/ stenting 05-29-2016--- duplex 06-09-2016 distal AAA measures 3.0cm x 3.1cm  . Anticoagulant long-term use    plavix  . Carotid stenosis, asymptomatic, bilateral    per duplex --- bilateral <50% ICA  . COPD with emphysema [facility])    pulmologist-  dr [name]  . Dyspnea on exertion   . First degree heart block   . GERD (gastroesophageal reflux disease)   . Heart murmur   . Hiatal hernia   . HOH (hard of hearing)   . Hyperlipidemia   . Hypertension    pt stopped taking losartan apprx. April 2018 , per pt pcp instructed him to stop  . Hypothyroidism   . IBS (irritable bowel syndrome)    intermittant constipation  . Lesion of left lung    followed by dr [name] (pulmologist)  post infection scarring  . Lower urinary tract symptoms (LUTS)   . Mild dementia   . Moderate aortic stenosis    per last echo --  valve area 1.1cm^2 ,    . OSA (obstructive sleep apnea)    has a cpap-  intolerant  . Peripheral vascular disease [facility])     cardiologist-- dr berry-- per last ABI/Aorta/Iliac 06-09-2016  patent bilateral iliac kissing stents, >50% stenosis bilateral iliac artery (R>L) and left external iliac artery, patent IVC  . Prostate cancer [facility]) urologist-  dr [name]  oncologist-  dr [name]   dx 03/ 2018-- Stage T1c, Gleason 4+5, PSA 8.15, vol 36cc--- currently ADT and planned external radiation  . RLS (restless legs syndrome)   . Schatzki's ring   . Wears glasses   . Wears partial dentures    upper    Patient Active Problem List   Diagnosis Date Noted  . Malignant neoplasm of prostate ([place]) 03/03/2017  . Mesenteric ischemia ([place]) 09/16/2016  . Atherosclerosis of aorta ([place]) 09/15/2016  . Hiatal hernia 09/03/2016  . Cavitating mass in left upper lung lobe 08/12/2016  . PVD (peripheral vascular disease) ([place]) 05/31/2016  . Aortic stenosis, moderate 05/27/2016  . Claudication ([place]) 04/16/2016  . BMI 29.0-29.9,adult 06/01/2015  . Hypothyroidism 11/07/2014  . COPD 11/07/2014  . BPH w/LUTS 11/07/2014  . Medication management 04/11/2014  . Vitamin D deficiency 07/05/2013  . Hyperlipidemia   . Prediabetes   . COPD with asthma ([place])   . IBS (irritable bowel syndrome)   . RLS (restless legs syndrome)   . OSA and COPD overlap syndrome ([place])   .  Essential hypertension 09/28/2009  . GERD 09/28/2009    Past Surgical History:  Procedure Laterality Date  . CARDIOVASCULAR STRESS TEST  04-23-2016   dr berry   normal nuclear study w/ no ischemia/  normal LV function and wall function, nuclear stress ef 57%  . CATARACT EXTRACTION W/ INTRAOCULAR LENS  IMPLANT, BILATERAL Bilateral   . COLONOSCOPY    . ESOPHAGOGASTRODUODENOSCOPY (EGD) WITH ESOPHAGEAL DILATION  last one 11-09-2015  dr Yolanda Bonine   dilation schatzki's ring  . HERNIA REPAIR    . INGUINAL HERNIA REPAIR Right 02/23/2014   Procedure: HERNIA REPAIR INGUINAL ADULT;  Surgeon: Joyice Faster. Cornett, MD;  Location: [place];  Service: General;  Laterality:  Right;  . INSERTION OF MESH Right 02/23/2014   Procedure: INSERTION OF MESH;  Surgeon: Joyice Faster. Cornett, MD;  Location: [place];  Service: General;  Laterality: Right;  . PERIPHERAL VASCULAR CATHETERIZATION N/A 05/29/2016   Procedure: Lower Extremity Angiography;  Surgeon: [name], MD;  Location: [place] CV [facility];  Service: Cardiovascular;  Laterality: N/A; [place] orbital rotational atherectomy, PTA and stenting bilateral common iliac arteries   . PILONIDAL CYST / SINUS EXCISION  2000  . PROSTATE BIOPSY  09-22-2016  dr [name] office  . TRANSTHORACIC ECHOCARDIOGRAM  05-05-2016   dr berry   ef 60-10%,  grade 1diastolic dysfunction/  moderate AV stenosis with mod. AR (right coronary and noncoronary cusp mobility seversly restricted);  valve area 1.25cm^2, mean gradient 27 mmHg, peak gradient 22mmHg/  mild MR  . TREATMENT FISTULA ANAL  2010       Home Medications    Prior to Admission medications   Medication Sig Start Date End Date Taking? Authorizing Provider  albuterol (PROVENTIL HFA;VENTOLIN HFA) 108 (90 Base) MCG/ACT inhaler Inhale 2 puffs into the lungs every 6 (six) hours as needed for wheezing or shortness of breath. 01/09/17   Rigoberto Noel, MD  aspirin EC 81 MG tablet Take 81 mg by mouth daily.    [provider]  Cholecalciferol (VITAMIN D3) 1000 units CAPS Take 2 capsules by mouth daily. To equal 7000units daily    [provider]  Cholecalciferol (VITAMIN D3) 5000 units CAPS Take 1 capsule by mouth daily.    [provider]  clopidogrel (PLAVIX) 75 MG tablet Take 1 tablet (75 mg total) by mouth daily with breakfast. 05/31/16   Lyda Jester M, PA-C  fluticasone (FLONASE) 50 MCG/ACT nasal spray Place 2 sprays into both nostrils daily. Patient taking differently: Place 2 sprays into both nostrils as needed.  09/15/16   [name], MD  levothyroxine (SYNTHROID, LEVOTHROID) 50 MCG tablet Take 1 tablet (50 mcg total)  by mouth daily. Patient taking differently: Take 50 mcg by mouth daily before breakfast.  12/22/16   [name], MD  losartan (COZAAR) 50 MG tablet Take 1 tablet (50 mg total) by mouth daily. Patient not taking: Reported on 03/04/2017 05/31/16   Lyda Jester M, PA-C  simvastatin (ZOCOR) 20 MG tablet TAKE ONE TABLET BY MOUTH ONCE DAILY Patient taking differently: TAKE ONE TABLET BY MOUTH ONCE DAILY--- takes in pm--- per pt takes a 1/4 tablet every other day , told by his pcp 03/04/17   [name], MD    Family History Family History  Problem Relation Age of Onset  . Heart attack Mother   . Stroke Father   . Asthma Father   . Cancer Neg Hx     Social History Social History  Substance Use  Topics  . Smoking status: Former Smoker    Packs/day: 1.00    Years: 50.00    Types: Cigarettes    Quit date: 07/05/2005  . Smokeless tobacco: Never Used  . Alcohol use No     Allergies   Penicillins; Sulfa antibiotics; and Versed [midazolam]   Review of Systems Review of Systems  Cardiovascular: Positive for near-syncope.  Neurological: Positive for dizziness, syncope and weakness.  All other systems reviewed and are negative.    Physical Exam Updated Vital Signs There were no vitals taken for this visit.  Physical Exam  Constitutional: He is oriented to person, place, and time.  Chronically ill   HENT:  Head: Normocephalic.  MM slightly dry   Eyes: Pupils are equal, round, and reactive to light. Conjunctivae and EOM are normal.  Neck: Normal range of motion. Neck supple.  Cardiovascular: Regular rhythm and normal heart sounds.   2/6 systolic murmur   Pulmonary/Chest: Effort normal and breath sounds normal. No respiratory distress. He has no wheezes. He has no rales.  Abdominal: Soft. Bowel sounds are normal. He exhibits no distension. There is no tenderness.  Musculoskeletal: Normal range of motion. He exhibits no edema.  Neurological: He is alert and oriented to  person, place, and time. No cranial nerve deficit. Coordination normal.  Skin: Skin is warm.  Psychiatric: He has a normal mood and affect.  Nursing note and vitals reviewed.    ED Treatments / Results  Labs (all labs ordered are listed, but only abnormal results are displayed) Labs Reviewed  CBC WITH DIFFERENTIAL/PLATELET  COMPREHENSIVE METABOLIC PANEL  I-STAT TROPONIN, ED    EKG  EKG Interpretation None       Radiology No results found.  Procedures Procedures (including critical care time)  Medications Ordered in ED Medications  sodium chloride 0.9 % bolus 1,000 mL (not administered)     Initial Impression / Assessment and Plan / ED Course  I have reviewed the triage vital signs and the nursing notes.  Pertinent labs & imaging results that were available during my care of the patient were reviewed by me and considered in my medical decision making (see chart for details).     Decklyn Hyder is a 76 y.o. male here with nausea, near syncope. Has hx of CAD, PVD s/p stents on plavix. He has poor appetite at baseline and had near syncope while at the dentist office. Likely from dehydration. Nonspecific EKG changes but has no chest pain. Will get labs, trop x 2, orthostatics. Will hydrate and reassess.   2:38 PM EKG showed TWI new since previous. I ran the case by Dr. Radford Pax, cardiologist on call. She reviewed his chart. Given hx of aortic stenosis on echo last year, recommend repeat echo. If abnormal, then get formal cardiology consult. Hospitalist to admit.    Final Clinical Impressions(s) / ED Diagnoses   Final diagnoses:  None    New Prescriptions New Prescriptions   No medications on file     Drenda Freeze, MD 04/27/17 1521

## 2017-04-27 NOTE — ED Notes (Signed)
CALL REPORT TO [place] @ [phone]

## 2017-04-27 NOTE — ED Notes (Signed)
URINE AT BEDSIDE. NOT ORDERED

## 2017-04-27 NOTE — ED Notes (Signed)
UPON ARRIVAL TO ROOM. EMESIS. UNDIGESTED FOOD. PT AND FAMILY STATES VOMITED. ADMISSION MD PRESENT DURING EVENT. PT STATES HX OF THE SAME. NO COMPLAINTS AT PRESENT. PAIN 0/10. AWARE OF ADMISSION

## 2017-04-27 NOTE — H&P (Signed)
[facility] History and Physical  Staci Carver AOZ:[id number] DOB: 1941-04-03 DOA: 04/27/2017  Referring physician:  PCP: [name], MD   Chief Complaint: "I almost passed out."  HPI: Robert Schaefer is a 76 y.o. male past medical history significant for carotid stenosis, aortic stenosis, AAA, who presents to the emergency room with chief complaint of near syncope.  Patient was doing well and was on his way to the dentist when he was sitting in his car and all of a sudden he almost passed out.  Patient said everything went dark nearly and then resolved.  Patient has a history of syncopal episodes related to his aortic stenosis.  He has been doing well and is followed by cardiology.  No recent medication changes.  Patient denies any chest pain shortness breath.  No fevers chills, nausea, vomiting, diarrhea.  Been eating and drinking at baseline.  Is limited by his esophageal dysfunction due to stricture.  ED course: EDP contacted patient's cardiologist who advised inpatient stay with echo.  Hospitlaist consulted for admission.   Review of Systems:  As per HPI otherwise 10 point review of systems negative.    Past Medical History:  Diagnosis Date  . AAA (abdominal aortic aneurysm) ([place])    found by berry at periphral vascular catherization w/ stenting 05-29-2016--- duplex 06-09-2016 distal AAA measures 3.0cm x 3.1cm  . Anticoagulant long-term use    plavix  . Carotid stenosis, asymptomatic, bilateral    per duplex --- bilateral <50% ICA  . COPD with emphysema [facility])    pulmologist-  dr [name]  . Dyspnea on exertion   . First degree heart block   . GERD (gastroesophageal reflux disease)   . Heart murmur   . Hiatal hernia   . HOH (hard of hearing)   . Hyperlipidemia   . Hypertension    pt stopped taking losartan apprx. April 2018 , per pt pcp instructed him to stop  . Hypothyroidism   . IBS (irritable bowel syndrome)    intermittant constipation  . Lesion of left lung    followed by dr [name] (pulmologist)  post infection scarring  . Lower urinary tract symptoms (LUTS)   . Mild dementia   . Moderate aortic stenosis    per last echo --  valve area 1.1cm^2 ,    . OSA (obstructive sleep apnea)    has a cpap-  intolerant  . Peripheral vascular disease [facility])    cardiologist-- dr berry-- per last ABI/Aorta/Iliac 06-09-2016  patent bilateral iliac kissing stents, >50% stenosis bilateral iliac artery (R>L) and left external iliac artery, patent IVC  . Prostate cancer [facility]) urologist-  dr [name]  oncologist-  dr [name]   dx 03/ 2018-- Stage T1c, Gleason 4+5, PSA 8.15, vol 36cc--- currently ADT and planned external radiation  . RLS (restless legs syndrome)   . Schatzki's ring   . Wears glasses   . Wears partial dentures    upper   Past Surgical History:  Procedure Laterality Date  . CARDIOVASCULAR STRESS TEST  04-23-2016   dr berry   normal nuclear study w/ no ischemia/  normal LV function and wall function, nuclear stress ef 57%  . CATARACT EXTRACTION W/ INTRAOCULAR LENS  IMPLANT, BILATERAL Bilateral   . COLONOSCOPY    . ESOPHAGOGASTRODUODENOSCOPY (EGD) WITH ESOPHAGEAL DILATION  last one 11-09-2015  dr Yolanda Bonine   dilation schatzki's ring  . HERNIA REPAIR    . INGUINAL HERNIA REPAIR Right 02/23/2014   Procedure: HERNIA REPAIR INGUINAL ADULT;  Surgeon:  Thomas A. Cornett, MD;  Location: [place];  Service: General;  Laterality: Right;  . INSERTION OF MESH Right 02/23/2014   Procedure: INSERTION OF MESH;  Surgeon: Joyice Faster. Cornett, MD;  Location: [place];  Service: General;  Laterality: Right;  . PERIPHERAL VASCULAR CATHETERIZATION N/A 05/29/2016   Procedure: Lower Extremity Angiography;  Surgeon: [name], MD;  Location: [place] CV LAB;  Service: Cardiovascular;  Laterality: N/A; Diamondback orbital rotational atherectomy, PTA and stenting bilateral common iliac arteries   . PILONIDAL CYST / SINUS EXCISION  2000  .  PROSTATE BIOPSY  09-22-2016  dr [name] office  . TRANSTHORACIC ECHOCARDIOGRAM  05-05-2016   dr berry   ef 08-67%,  grade 1diastolic dysfunction/  moderate AV stenosis with mod. AR (right coronary and noncoronary cusp mobility seversly restricted);  valve area 1.25cm^2, mean gradient 27 mmHg, peak gradient 83mmHg/  mild MR  . TREATMENT FISTULA ANAL  2010   Social History:  reports that he quit smoking about 11 years ago. His smoking use included Cigarettes. He has a 50.00 pack-year smoking history. He has never used smokeless tobacco. He reports that he does not drink alcohol or use drugs.  Allergies  Allergen Reactions  . Penicillins Anaphylaxis, Itching and Swelling    Has patient had a PCN reaction causing immediate rash, facial/tongue/throat swelling, SOB or lightheadedness with hypotension: Yes Has patient had a PCN reaction causing severe rash involving mucus membranes or skin necrosis: No Has patient had a PCN reaction that required hospitalization: Yes Has patient had a PCN reaction occurring within the last 10 years: No If all of the above answers are "NO", then may proceed with Cephalosporin use.   . Sulfa Antibiotics Anaphylaxis and Swelling  . Versed [Midazolam] Other (See Comments)    Confusion, delerium    Family History  Problem Relation Age of Onset  . Heart attack Mother   . Stroke Father   . Asthma Father   . Cancer Neg Hx      Prior to Admission medications   Medication Sig Start Date End Date Taking? Authorizing Provider  albuterol (PROVENTIL HFA;VENTOLIN HFA) 108 (90 Base) MCG/ACT inhaler Inhale 2 puffs into the lungs every 6 (six) hours as needed for wheezing or shortness of breath. 01/09/17  Yes Rigoberto Noel, MD  aspirin EC 81 MG tablet Take 81 mg by mouth daily.   Yes [provider]  Cholecalciferol (VITAMIN D3) 1000 units CAPS Take 2 capsules by mouth daily. Takes along with 5000 units to make a total of 7000 units per day   Yes [provider]  Cholecalciferol (VITAMIN D3) 5000 units CAPS Take 1 capsule by mouth daily. Takes along with 2000 units to make a total of 7000 units per day   Yes [provider]  clopidogrel (PLAVIX) 75 MG tablet Take 1 tablet (75 mg total) by mouth daily with breakfast. 05/31/16  Yes Rosita Fire, Brittainy M, PA-C  fluticasone (FLONASE) 50 MCG/ACT nasal spray Place 2 sprays into both nostrils daily. Patient taking differently: Place 2 sprays into both nostrils daily as needed for allergies.  09/15/16  Yes [name], MD  levothyroxine (SYNTHROID, LEVOTHROID) 50 MCG tablet Take 1 tablet (50 mcg total) by mouth daily. 12/22/16  Yes [name], MD  losartan (COZAAR) 50 MG tablet Take 1 tablet (50 mg total) by mouth daily. Patient not taking: Reported on 03/04/2017 05/31/16   Lyda Jester M, PA-C  simvastatin (ZOCOR) 20 MG tablet TAKE  ONE TABLET BY MOUTH ONCE DAILY Patient not taking: Reported on 04/27/2017 03/04/17   [name], MD   Physical Exam: Vitals:   04/27/17 1104 04/27/17 1330 04/27/17 1536 04/27/17 1754  BP: (!) 135/58 (!) 147/43 (!) 149/44 (!) 159/46  Pulse: 89 (!) 58 (!) 56 (!) 57  Resp: (!) 23 15 19 16   Temp:      SpO2: 95% 97% 97% 99%    Wt Readings from Last 3 Encounters:  03/04/17 68.7 kg (151 lb 6.4 oz)  02/26/17 68.8 kg (151 lb 9.6 oz)  01/09/17 70.9 kg (156 lb 3.2 oz)    General:  Appears calm and comfortable; a&Ox3, hard of hearing Eyes:  PERRL, EOMI, normal lids, iris ENT:  grossly normal hearing, lips & tongue Neck:  no LAD, masses or thyromegaly Cardiovascular:  brady, RR, no r/g. No LE edema.[name] Respiratory:  CTA bilaterally, no w/r/r. Normal respiratory effort. Abdomen:  soft, ntnd Skin:  no rash or induration seen on limited exam Musculoskeletal:  grossly normal tone BUE/BLE Psychiatric:  grossly normal mood and affect, speech fluent and appropriate Neurologic:  CN 2-12 grossly intact, moves all extremities in coordinated fashion.           Labs on Admission:  Basic Metabolic Panel:  Recent Labs Lab 04/27/17 1114  NA 138  K 4.3  CL 101  CO2 28  GLUCOSE 109*  BUN 19  CREATININE 1.02  CALCIUM 9.9   Liver Function Tests:  Recent Labs Lab 04/27/17 1114  AST 19  ALT 15*  ALKPHOS 61  BILITOT 0.9  PROT 7.6  ALBUMIN 4.2    Recent Labs Lab 04/27/17 1114  LIPASE 32   No results for input(s): AMMONIA in the last 168 hours. CBC:  Recent Labs Lab 04/27/17 1114  WBC 7.5  NEUTROABS 5.0  HGB 14.3  HCT 40.2  MCV 93.9  PLT 139*   Cardiac Enzymes: No results for input(s): CKTOTAL, CKMB, CKMBINDEX, TROPONINI in the last 168 hours.  BNP (last 3 results) No results for input(s): BNP in the last 8760 hours.  ProBNP (last 3 results) No results for input(s): PROBNP in the last 8760 hours.   Serum creatinine: 1.02 mg/dL 04/27/17 1114 Estimated creatinine clearance: 54.5 mL/min  CBG: No results for input(s): GLUCAP in the last 168 hours.  Radiological Exams on Admission: Dg Chest 2 View  Result Date: 04/27/2017 CLINICAL DATA:  Syncope. EXAM: CHEST  2 VIEW COMPARISON:  Radiograph of March 27, 2016. FINDINGS: The heart size and mediastinal contours are within normal limits. Both lungs are clear. No pneumothorax or pleural effusion is noted. Atherosclerosis of thoracic aorta is noted. The visualized skeletal structures are unremarkable. IMPRESSION: No active cardiopulmonary disease.  Aortic atherosclerosis. Electronically Signed   By: Marijo Conception, M.D.   On: 04/27/2017 09:52    EKG: Independently reviewed.  Tachycardia.  First-degree AV block.  No STEMI.  Assessment/Plan Principal Problem:   Near syncope Active Problems:   Hypothyroidism   COPD   Near syncope - serial trop ordered, initial neg - prn EKG CP - echo ordered for AM - tele bed, cardiac monitoring - zofran prn for nausea Per EDP, spoke to cardiology and patient can be cleared for his prostate procedure if echo is unchanged.   If echo changes cardiology consult needed.  Hypothyroidism Cont OP synthroid 50 mcg qd No signs of hyper or hypothyroidism  Hypertension When necessary hydralazine 10 mg IV as needed for severe blood pressure  CAD Holding Plavix  and aspirin due to planned procedure  PreDM [place] glucose in the AM  Dysphagia With history of esophageal stricture, needs a new GI doctor, no recent dilation and is regularly symptomatic Consider inpatient consult for procedure after cleared by cardiology   Code Status: FC  DVT Prophylaxis: lovenox Family Communication: wife at bedside Disposition Plan: Pending Improvement  Status: tele obs  Elwin Mocha, MD Family Medicine [facility] [email or web address] Password [email or web address]

## 2017-04-27 NOTE — ED Notes (Signed)
ED Provider at bedside. 

## 2017-04-27 NOTE — ED Triage Notes (Addendum)
Pt c/o near syncopal episode while walking into the dentist office.  Denies pain.  Denies v/d.  Sts "I felt a little nauseous while I was sitting in the office."  Significant cardiac Hx.  Sts he has not been eating or drinking well.

## 2017-04-28 ENCOUNTER — Observation Stay (HOSPITAL_BASED_OUTPATIENT_CLINIC_OR_DEPARTMENT_OTHER): Payer: Medicare Other

## 2017-04-28 DIAGNOSIS — I351 Nonrheumatic aortic (valve) insufficiency: Secondary | ICD-10-CM

## 2017-04-28 DIAGNOSIS — R55 Syncope and collapse: Secondary | ICD-10-CM | POA: Diagnosis not present

## 2017-04-28 LAB — ECHOCARDIOGRAM COMPLETE
Height: 66 in
Weight: 2302.4 oz

## 2017-04-28 LAB — TROPONIN I: Troponin I: <0.03 ng/mL (ref <0.03)

## 2017-04-28 MED ORDER — CLOPIDOGREL BISULFATE 75 MG PO TABS: 75.0000 mg | ORAL_TABLET | Freq: Every day | ORAL | 11 refills | Status: DC

## 2017-04-28 NOTE — Progress Notes (Signed)
Patient woke up confused A&O only to self. Threatening to hit staff. Refusing to get back in bed. Explained to patient the importance of getting back in bed or sitting in chair for his safety. Patient uncooperative. CN called to assist and finally got patient to agree to sit in the chair.

## 2017-04-28 NOTE — Care Management Obs Status (Signed)
[facility] NOTIFICATION   Patient Details  Name: Melbert Botelho MRN: [id number] Date of Birth: Aug 25, 1940   Medicare Observation Status Notification Given:  Yes    Purcell Mouton, RN 04/28/2017, 4:15 PM

## 2017-04-28 NOTE — Discharge Instructions (Signed)
·   PLEASE NOTE NOT TO TAKE PLAVIX UNTIL AFTER PROCEDURE   Near-Syncope Near-syncope is when you suddenly get weak or dizzy, or you feel like you might pass out (faint). During an episode of near-syncope, you may:  Feel dizzy or light-headed.  Feel sick to your stomach (nauseous).  See all white or all black.  Have cold, clammy skin.  If you passed out, get help right away.Call your local emergency services (911 in the [place] Do not drive yourself to the hospital. Follow these instructions at home: Pay attention to any changes in your symptoms. Take these actions to help with your condition:  Have someone stay with you until you feel stable.  Do not drive, use machinery, or play sports until your doctor says it is okay.  Keep all follow-up visits as told by your doctor. This is important.  If you start to feel like you might pass out, lie down right away and raise (elevate) your feet above the level of your heart. Breathe deeply and steadily. Wait until all of the symptoms are gone.  Drink enough fluid to keep your pee (urine) clear or pale yellow.  If you are taking blood pressure or heart medicine, get up slowly and spend many minutes getting ready to sit and then stand. This can help with dizziness.  Take over-the-counter and prescription medicines only as told by your doctor.  Get help right away if:  You have a very bad headache.  You have unusual pain in your chest, tummy, or back.  You are bleeding from your mouth or rectum.  You have black or tarry poop (stool).  You have a very fast or uneven heartbeat (palpitations).  You pass out one time or more than once.  You have jerky movements that you cannot control (seizure).  You are confused.  You have trouble walking.  You are very weak.  You have vision problems. These symptoms may be an emergency. Do not wait to see if the symptoms will go away. Get medical help right away. Call your local emergency services  (911 in the [place] Do not drive yourself to the hospital. This information is not intended to replace advice given to you by your health care provider. Make sure you discuss any questions you have with your health care provider. Document Released: 12/03/2007 Document Revised: 11/22/2015 Document Reviewed: 02/28/2015 Elsevier Interactive Patient Education  2017 [employer].

## 2017-04-28 NOTE — Evaluation (Signed)
Clinical/Bedside Swallow Evaluation Patient Details  Name: Robert Schaefer MRN: [id number] Date of Birth: [birth year]  Today's Date: 04/28/2017 Time: SLP Start Time (ACUTE ONLY): 1509 SLP Stop Time (ACUTE ONLY): 1555 SLP Time Calculation (min) (ACUTE ONLY): 46 min  Past Medical History:  Past Medical History:  Diagnosis Date  . AAA (abdominal aortic aneurysm) ([place])    found by berry at periphral vascular catherization w/ stenting 05-29-2016--- duplex 06-09-2016 distal AAA measures 3.0cm x 3.1cm  . Anticoagulant long-term use    plavix  . Carotid stenosis, asymptomatic, bilateral    per duplex --- bilateral <50% ICA  . COPD with emphysema [facility])    pulmologist-  dr [name]  . Dyspnea on exertion   . First degree heart block   . GERD (gastroesophageal reflux disease)   . Heart murmur   . Hiatal hernia   . HOH (hard of hearing)   . Hyperlipidemia   . Hypertension    pt stopped taking losartan apprx. April 2018 , per pt pcp instructed him to stop  . Hypothyroidism   . IBS (irritable bowel syndrome)    intermittant constipation  . Lesion of left lung    followed by dr [name] (pulmologist)  post infection scarring  . Lower urinary tract symptoms (LUTS)   . Mild dementia   . Moderate aortic stenosis    per last echo --  valve area 1.1cm^2 ,    . OSA (obstructive sleep apnea)    has a cpap-  intolerant  . Peripheral vascular disease [facility])    cardiologist-- dr berry-- per last ABI/Aorta/Iliac 06-09-2016  patent bilateral iliac kissing stents, >50% stenosis bilateral iliac artery (R>L) and left external iliac artery, patent IVC  . Prostate cancer [facility]) urologist-  dr [name]  oncologist-  dr [name]   dx 03/ 2018-- Stage T1c, Gleason 4+5, PSA 8.15, vol 36cc--- currently ADT and planned external radiation  . RLS (restless legs syndrome)   . Schatzki's ring   . Wears glasses   . Wears partial dentures    upper   Past Surgical History:  Past Surgical History:  Procedure Laterality Date  .  CARDIOVASCULAR STRESS TEST  04-23-2016   dr berry   normal nuclear study w/ no ischemia/  normal LV function and wall function, nuclear stress ef 57%  . CATARACT EXTRACTION W/ INTRAOCULAR LENS  IMPLANT, BILATERAL Bilateral   . COLONOSCOPY    . ESOPHAGOGASTRODUODENOSCOPY (EGD) WITH ESOPHAGEAL DILATION  last one 11-09-2015  dr Yolanda Bonine   dilation schatzki's ring  . HERNIA REPAIR    . INGUINAL HERNIA REPAIR Right 02/23/2014   Procedure: HERNIA REPAIR INGUINAL ADULT;  Surgeon: Joyice Faster. Cornett, MD;  Location: [place];  Service: General;  Laterality: Right;  . INSERTION OF MESH Right 02/23/2014   Procedure: INSERTION OF MESH;  Surgeon: Joyice Faster. Cornett, MD;  Location: [place];  Service: General;  Laterality: Right;  . PERIPHERAL VASCULAR CATHETERIZATION N/A 05/29/2016   Procedure: Lower Extremity Angiography;  Surgeon: [name], MD;  Location: [place] CV LAB;  Service: Cardiovascular;  Laterality: N/A; Diamondback orbital rotational atherectomy, PTA and stenting bilateral common iliac arteries   . PILONIDAL CYST / SINUS EXCISION  2000  . PROSTATE BIOPSY  09-22-2016  dr [name] office  . TRANSTHORACIC ECHOCARDIOGRAM  05-05-2016   dr berry   ef 19-37%,  grade 1diastolic dysfunction/  moderate AV stenosis with mod. AR (right coronary and noncoronary cusp mobility seversly restricted);  valve area 1.25cm^2, mean  gradient 27 mmHg, peak gradient 57mmHg/  mild MR  . TREATMENT FISTULA ANAL  2010   HPI:  76 yo male adm to [facility] with near syncope on the way to the dentist.  PMH + for esophageal dysphagia s/p endoscopy/dilatation = last 11/09/2015 in [facility], GERD, COPD.   Pt reports improved swallowing occuring only for 3 days after dilatation.  Pt on heart healthy diet currently and admits to regurgitating nearly all of his meal after intake.  He does report improved tolerance of Ensure alone.   RN reports pt with tolerance of po medication.  Pt and family reports he  vomited nearly all of his meal after eating today.   Admitting MD advised to consider GI referral in house - however pt observation status.  Dysphagia is chronic for years per pt but admits it has worsened in the last year.   He admits to dysphagia with food and drink - causing him to regurgitate.   He denies coughing with regurgitation.   No pneumonias but significant weight loss reported *40 pounds and pt admits to poor appetite.   Pt reports taking a PPI for a short time but stopped due to lack of improvement.     Assessment / Plan / Recommendation Clinical Impression  Pt with known h/o esophageal dysphagia with tertiary contractions and Shaztki's ring s/p endoscopy/dilation May 2017.  NO focal CN deficits to indicate oropharyngeal deficits nor indications of oropharyngeal deficits.  His clinical symptoms are consistent with esophageal impairments including globus, delayed coughing and regurgitation.    Pt regurgitated nearly all of po consumed with this SLP *few bites applesauce and 3 ounces water.  Immediately before regurgitation, he sensed applesauce residual in pharynx *suspect in distal esophagus.  He was protective of his airway and did not cough with regurgitation.  Concern for adequacy of nutrition present with level of dysphagia.   Fortunately pt does report improved tolerance of Ensure liquids.    He also admits to adequate clearance of medication with liquids when he first wakes up.  Suspect esophagus is clear upon awakening and thus decrease symptoms.    Advised pt to continue po diet with precautions including consuming Ensure/liquid supplement by itself TID to help nutritional status.   Advised several small meals/day and cease intake if senses residuals.   Concern for dysmotility contributing to symptoms as pt reports solid and liquid dysphagia/regurgitation.    Follow up with GI as OP highly recommended and family/pt agreeable.  Thanks for this consult.    SLP Visit Diagnosis:  Dysphagia, unspecified (R13.10)    Aspiration Risk  Mild aspiration risk;Risk for inadequate nutrition/hydration    Diet Recommendation     Medication Administration: Other (Comment) (as tolerated)    Other  Recommendations Recommended Consults:  (OP GI) Oral Care Recommendations: Oral care BID   Follow up Recommendations        Frequency and Duration   n/a         Prognosis   n/a     Swallow Study   General Date of Onset: 04/28/17 HPI: 76 yo male adm to [facility] with near syncope on the way to the dentist.  PMH + for esophageal dysphagia s/p endoscopy/dilatation = last 11/09/2015 in [facility], GERD, COPD.   Pt reports improved swallowing occuring only for 3 days after dilatation.  Pt on heart healthy diet currently and admits to regurgitating nearly all of his meal after intake.  He does report improved tolerance of Ensure alone.   RN  reports pt with tolerance of po medication and he consumed his meal.  Pt and family reports he vomited nearly all of his meal after eating.   Admitting MD advised to consider GI referral in house - however pt observation status.  Dysphagia is chronic for years per pt but admits it has worsened in the last year.   He admits to dysphagia with food and drink - causing him to regurgitate.   No pneumonias but significant weight loss reported *40 pounds and pt admits to poor appetite.    Type of Study: Bedside Swallow Evaluation Previous Swallow Assessment: see HHx,  esophagram 09/30/2011 showing small hiatal hernia, tertiary contractions, tight distal  Diet Prior to this Study: Regular;Thin liquids Temperature Spikes Noted: No Respiratory Status: Room air History of Recent Intubation: No Behavior/Cognition: Alert Oral Cavity Assessment: Within Functional Limits Oral Care Completed by SLP: No Oral Cavity - Dentition: Adequate natural dentition, partial Vision: Functional for self-feeding Self-Feeding Abilities: Able to feed self Patient Positioning: Upright in  bed Baseline Vocal Quality: Normal Volitional Cough: Strong Volitional Swallow: Able to elicit    Oral/Motor/Sensory Function Overall Oral Motor/Sensory Function: Within functional limits   Ice Chips Ice chips: Not tested   Thin Liquid Thin Liquid: Impaired Presentation: Cup Pharyngeal  Phase Impairments: Cough - Delayed Other Comments: 3 ounce water test resulted in delayed cough - pt admits to this being baseline    Nectar Thick Nectar Thick Liquid: Not tested   Honey Thick Honey Thick Liquid: Not tested   Puree Puree: Within functional limits Presentation: Self Fed;Spoon Other Comments:  (sensation of food lodging in throat, delayed regurgitation)   Solid   GO   Solid: Not tested    Functional Assessment Tool Used: clinical judgement Functional Limitations: Swallowing Swallow Current Status (W1093): At least 20 percent but less than 40 percent impaired, limited or restricted Swallow Goal Status [phone]): At least 20 percent but less than 40 percent impaired, limited or restricted Swallow Discharge Status [id number]): At least 20 percent but less than 40 percent impaired, limited or restricted   Luanna Salk, [place] [facility] SLP [phone]

## 2017-04-28 NOTE — Progress Notes (Signed)
  Echocardiogram 2D Echocardiogram has been performed.  Darlina Sicilian M 04/28/2017, 11:29 AM

## 2017-04-28 NOTE — Discharge Summary (Signed)
Physician Discharge Summary  Robert Schaefer GHW:[id number] DOB: 1941-05-24 DOA: 04/27/2017  PCP: [name], MD  Admit date: 04/27/2017 Discharge date: 04/28/2017  Recommendations for Outpatient Follow-up:  1. Pt will need to follow up with PCP in 1-2 weeks post discharge 2. Please obtain BMP to evaluate electrolytes and kidney function 3. Please also check CBC to evaluate Hg and Hct levels 4. Outpatient GI follow up recommended and family made aware  5. Please note that losartan and statin removed from pt's list as pt said he no longer takes that 6. Please also note that pt was told to hold Plavix until after procedure (radiation seeds to be placed)  Discharge Diagnoses:  Principal Problem:   Near syncope Active Problems:   Hypothyroidism   COPD   Discharge Condition: Stable  Diet recommendation: Heart healthy diet discussed in details   History of present illness:  Pt is 76 yo male with known aortic stenosis, presented with main concern of near syncope. He almost passed out while he was sitting in car. Pt was worried as he is scheduled for radiation seeds placement that was scheduled for 10/31.   Hospital Course:  Principal Problem:   Near syncope - unclear etiology - currently hemodynamically stable - ECHO with stable EF - spoke with cardiology, pt will have an outpatient follow up appointment scheduled with his cardiologist   Active Problems:   Hypothyroidism - continue synthroid     COPD - stable resp status    Procedures/Studies: Dg Chest 2 View  Result Date: 04/27/2017 CLINICAL DATA:  Syncope. EXAM: CHEST  2 VIEW COMPARISON:  Radiograph of March 27, 2016. FINDINGS: The heart size and mediastinal contours are within normal limits. Both lungs are clear. No pneumothorax or pleural effusion is noted. Atherosclerosis of thoracic aorta is noted. The visualized skeletal structures are unremarkable. IMPRESSION: No active cardiopulmonary disease.  Aortic  atherosclerosis. Electronically Signed   By: Marijo Conception, M.D.   On: 04/27/2017 09:52   Discharge Exam: Vitals:   04/28/17 1415 04/28/17 1501  BP:  (!) 158/41  Pulse:  (!) 58  Resp:  16  Temp:  98.9 F (37.2 C)  SpO2: 96% 93%   Vitals:   04/28/17 0157 04/28/17 0541 04/28/17 1415 04/28/17 1501  BP: (!) 150/50 (!) 155/62  (!) 158/41  Pulse: 80 65  (!) 58  Resp: 20 18  16   Temp: 97.9 F (36.6 C) 98.4 F (36.9 C)  98.9 F (37.2 C)  TempSrc: Oral Axillary  Oral  SpO2: 95% 94% 96% 93%  Weight:      Height:        General: Pt is alert, follows commands appropriately, not in acute distress Cardiovascular: bradycardia, S1/S2 +,  no rubs, no gallops Respiratory: Clear to auscultation bilaterally, no wheezing, no crackles, no rhonchi Abdominal: Soft, non tender, non distended, bowel sounds +, no guarding  Discharge Instructions  Discharge Instructions    Diet - low sodium heart healthy    Complete by:  As directed    Increase activity slowly    Complete by:  As directed      Allergies as of 04/28/2017      Reactions   Penicillins Anaphylaxis, Itching, Swelling   Has patient had a PCN reaction causing immediate rash, facial/tongue/throat swelling, SOB or lightheadedness with hypotension: Yes Has patient had a PCN reaction causing severe rash involving mucus membranes or skin necrosis: No Has patient had a PCN reaction that required hospitalization: Yes Has patient had  a PCN reaction occurring within the last 10 years: No If all of the above answers are "NO", then may proceed with Cephalosporin use.   Sulfa Antibiotics Anaphylaxis, Swelling   Versed [midazolam] Other (See Comments)   Confusion, delerium      Medication List    STOP taking these medications   losartan 50 MG tablet Commonly known as:  COZAAR   simvastatin 20 MG tablet Commonly known as:  ZOCOR     TAKE these medications   albuterol 108 (90 Base) MCG/ACT inhaler Commonly known as:  PROVENTIL  HFA;VENTOLIN HFA Inhale 2 puffs into the lungs every 6 (six) hours as needed for wheezing or shortness of breath.   aspirin EC 81 MG tablet Take 81 mg by mouth daily.   clopidogrel 75 MG tablet Commonly known as:  PLAVIX Take 1 tablet (75 mg total) by mouth daily with breakfast.   fluticasone 50 MCG/ACT nasal spray Commonly known as:  FLONASE Place 2 sprays into both nostrils daily. What changed:  when to take this  reasons to take this   levothyroxine 50 MCG tablet Commonly known as:  SYNTHROID, LEVOTHROID Take 1 tablet (50 mcg total) by mouth daily.   Vitamin D3 5000 units Caps Take 1 capsule by mouth daily. Takes along with 2000 units to make a total of 7000 units per day   Vitamin D3 1000 units Caps Take 2 capsules by mouth daily. Takes along with 5000 units to make a total of 7000 units per day      Follow-up Information    [name], MD Follow up.   Specialty:  Internal Medicine Contact information: [address] [place] [address] [phone]          The results of significant diagnostics from this hospitalization (including imaging, microbiology, ancillary and laboratory) are listed below for reference.    Microbiology: No results found for this or any previous visit (from the past 240 hour(s)).   Labs: Basic Metabolic Panel:  Recent Labs Lab 04/27/17 1114  NA 138  K 4.3  CL 101  CO2 28  GLUCOSE 109*  BUN 19  CREATININE 1.02  CALCIUM 9.9   Liver Function Tests:  Recent Labs Lab 04/27/17 1114  AST 19  ALT 15*  ALKPHOS 61  BILITOT 0.9  PROT 7.6  ALBUMIN 4.2    Recent Labs Lab 04/27/17 1114  LIPASE 32   CBC:  Recent Labs Lab 04/27/17 1114  WBC 7.5  NEUTROABS 5.0  HGB 14.3  HCT 40.2  MCV 93.9  PLT 139*   Cardiac Enzymes:  Recent Labs Lab 04/27/17 2023 04/27/17 2321 04/28/17 0113  TROPONINI <0.03 <0.03 <0.03    SIGNED: Time coordinating discharge: Over 30 minutes  Faye Ramsay, MD  [facility] 04/28/2017, 4:01 PM Pager [phone]  If 7PM-7AM, please contact night-coverage [email or web address] Password [email or web address]

## 2017-04-29 ENCOUNTER — Ambulatory Visit (HOSPITAL_BASED_OUTPATIENT_CLINIC_OR_DEPARTMENT_OTHER): Payer: Medicare Other | Admitting: Anesthesiology

## 2017-04-29 ENCOUNTER — Encounter (HOSPITAL_BASED_OUTPATIENT_CLINIC_OR_DEPARTMENT_OTHER): Payer: Self-pay | Admitting: Anesthesiology

## 2017-04-29 ENCOUNTER — Other Ambulatory Visit: Payer: Self-pay | Admitting: Urology

## 2017-04-29 ENCOUNTER — Telehealth: Payer: Self-pay | Admitting: *Deleted

## 2017-04-29 ENCOUNTER — Ambulatory Visit (HOSPITAL_COMMUNITY): Admission: RE | Admit: 2017-04-29 | Payer: Medicare Other | Source: Ambulatory Visit

## 2017-04-29 ENCOUNTER — Encounter (HOSPITAL_BASED_OUTPATIENT_CLINIC_OR_DEPARTMENT_OTHER)
Admission: RE | Disposition: A | Discharge status: Home / Self Care | Payer: Self-pay | Source: Ambulatory Visit | Attending: Urology

## 2017-04-29 ENCOUNTER — Ambulatory Visit (HOSPITAL_BASED_OUTPATIENT_CLINIC_OR_DEPARTMENT_OTHER)
Admission: RE | Admit: 2017-04-29 | Discharge: 2017-04-29 | Disposition: A | Discharge status: Home / Self Care | Payer: Medicare Other | Source: Ambulatory Visit | Attending: Urology | Admitting: Urology

## 2017-04-29 ENCOUNTER — Encounter (HOSPITAL_BASED_OUTPATIENT_CLINIC_OR_DEPARTMENT_OTHER): Payer: Self-pay

## 2017-04-29 DIAGNOSIS — E039 Hypothyroidism, unspecified: Secondary | ICD-10-CM | POA: Diagnosis not present

## 2017-04-29 DIAGNOSIS — Z7982 Long term (current) use of aspirin: Secondary | ICD-10-CM | POA: Diagnosis not present

## 2017-04-29 DIAGNOSIS — I714 Abdominal aortic aneurysm, without rupture: Secondary | ICD-10-CM | POA: Diagnosis not present

## 2017-04-29 DIAGNOSIS — G4733 Obstructive sleep apnea (adult) (pediatric): Secondary | ICD-10-CM | POA: Insufficient documentation

## 2017-04-29 DIAGNOSIS — I739 Peripheral vascular disease, unspecified: Secondary | ICD-10-CM | POA: Insufficient documentation

## 2017-04-29 DIAGNOSIS — E785 Hyperlipidemia, unspecified: Secondary | ICD-10-CM | POA: Diagnosis not present

## 2017-04-29 DIAGNOSIS — C61 Malignant neoplasm of prostate: Secondary | ICD-10-CM | POA: Insufficient documentation

## 2017-04-29 DIAGNOSIS — K219 Gastro-esophageal reflux disease without esophagitis: Secondary | ICD-10-CM | POA: Insufficient documentation

## 2017-04-29 DIAGNOSIS — Z8546 Personal history of malignant neoplasm of prostate: Secondary | ICD-10-CM

## 2017-04-29 DIAGNOSIS — G2581 Restless legs syndrome: Secondary | ICD-10-CM | POA: Diagnosis not present

## 2017-04-29 DIAGNOSIS — I10 Essential (primary) hypertension: Secondary | ICD-10-CM | POA: Insufficient documentation

## 2017-04-29 DIAGNOSIS — Z880 Allergy status to penicillin: Secondary | ICD-10-CM | POA: Insufficient documentation

## 2017-04-29 DIAGNOSIS — Z87891 Personal history of nicotine dependence: Secondary | ICD-10-CM | POA: Diagnosis not present

## 2017-04-29 DIAGNOSIS — F0390 Unspecified dementia without behavioral disturbance: Secondary | ICD-10-CM | POA: Diagnosis not present

## 2017-04-29 DIAGNOSIS — K589 Irritable bowel syndrome without diarrhea: Secondary | ICD-10-CM | POA: Insufficient documentation

## 2017-04-29 DIAGNOSIS — Z79899 Other long term (current) drug therapy: Secondary | ICD-10-CM | POA: Diagnosis not present

## 2017-04-29 DIAGNOSIS — I700 Atherosclerosis of aorta: Secondary | ICD-10-CM | POA: Diagnosis not present

## 2017-04-29 DIAGNOSIS — Z7901 Long term (current) use of anticoagulants: Secondary | ICD-10-CM | POA: Insufficient documentation

## 2017-04-29 HISTORY — PX: SPACE OAR INSTILLATION: SHX6769

## 2017-04-29 HISTORY — DX: Occlusion and stenosis of bilateral carotid arteries: I65.23

## 2017-04-29 HISTORY — DX: Solitary pulmonary nodule: R91.1

## 2017-04-29 HISTORY — DX: Esophageal obstruction: K22.2

## 2017-04-29 HISTORY — DX: Hypothyroidism, unspecified: E03.9

## 2017-04-29 HISTORY — DX: Abdominal aortic aneurysm, without rupture, unspecified: I71.40

## 2017-04-29 HISTORY — DX: Abdominal aortic aneurysm, without rupture: I71.4

## 2017-04-29 HISTORY — DX: Peripheral vascular disease, unspecified: I73.9

## 2017-04-29 HISTORY — PX: GOLD SEED IMPLANT: SHX6343

## 2017-04-29 LAB — POCT I-STAT, CHEM 8
BUN: 24 mg/dL — ABNORMAL HIGH (ref 6–20)
CHLORIDE: 104 mmol/L (ref 101–111)
Calcium, Ion: 1.35 mmol/L (ref 1.15–1.40)
Creatinine, Ser: 0.9 mg/dL (ref 0.61–1.24)
Glucose, Bld: 116 mg/dL — ABNORMAL HIGH (ref 65–99)
HEMATOCRIT: 40 % (ref 39.0–52.0)
Hemoglobin: 13.6 g/dL (ref 13.0–17.0)
POTASSIUM: 3.8 mmol/L (ref 3.5–5.1)
SODIUM: 142 mmol/L (ref 135–145)
TCO2: 26 mmol/L (ref 22–32)

## 2017-04-29 SURGERY — INSERTION, GOLD SEEDS
Anesthesia: General | Site: Rectum

## 2017-04-29 MED ORDER — FENTANYL CITRATE (PF) 100 MCG/2ML IJ SOLN
INTRAMUSCULAR | Status: AC
  Filled 2017-04-29: qty 2

## 2017-04-29 MED ORDER — FENTANYL CITRATE (PF) 100 MCG/2ML IJ SOLN
INTRAMUSCULAR | Status: DC | PRN
  Administered 2017-04-29: 25 ug via INTRAVENOUS

## 2017-04-29 MED ORDER — FENTANYL CITRATE (PF) 100 MCG/2ML IJ SOLN
25.0000 ug | INTRAMUSCULAR | Status: DC | PRN
  Filled 2017-04-29: qty 1

## 2017-04-29 MED ORDER — PROPOFOL 10 MG/ML IV BOLUS
INTRAVENOUS | Status: AC
  Filled 2017-04-29: qty 40

## 2017-04-29 MED ORDER — DEXAMETHASONE SODIUM PHOSPHATE 10 MG/ML IJ SOLN
INTRAMUSCULAR | Status: DC | PRN
  Administered 2017-04-29: 5 mg via INTRAVENOUS

## 2017-04-29 MED ORDER — ONDANSETRON HCL 4 MG/2ML IJ SOLN
INTRAMUSCULAR | Status: AC
  Filled 2017-04-29: qty 2

## 2017-04-29 MED ORDER — LACTATED RINGERS IV SOLN
INTRAVENOUS | Status: DC
  Administered 2017-04-29: 07:00:00 via INTRAVENOUS
  Filled 2017-04-29: qty 1000

## 2017-04-29 MED ORDER — PROPOFOL 500 MG/50ML IV EMUL
INTRAVENOUS | Status: DC | PRN
  Administered 2017-04-29: 200 ug/kg/min via INTRAVENOUS

## 2017-04-29 MED ORDER — ONDANSETRON HCL 4 MG/2ML IJ SOLN
INTRAMUSCULAR | Status: DC | PRN
  Administered 2017-04-29: 4 mg via INTRAVENOUS

## 2017-04-29 MED ORDER — GENTAMICIN SULFATE 40 MG/ML IJ SOLN
5.0000 mg/kg | INTRAVENOUS | Status: AC
  Administered 2017-04-29: 340 mg via INTRAVENOUS
  Filled 2017-04-29: qty 8.5

## 2017-04-29 MED ORDER — GENTAMICIN SULFATE 40 MG/ML IJ SOLN
5.0000 mg/kg | INTRAVENOUS | Status: DC
  Filled 2017-04-29: qty 8.5

## 2017-04-29 MED ORDER — SODIUM CHLORIDE 0.9 % IJ SOLN
INTRAMUSCULAR | Status: DC | PRN
  Administered 2017-04-29 (×2): 10 mL via INTRAVENOUS

## 2017-04-29 MED ORDER — FLEET ENEMA 7-19 GM/118ML RE ENEM
1.0000 | ENEMA | Freq: Once | RECTAL | Status: AC
  Administered 2017-04-29: 1 via RECTAL
  Filled 2017-04-29: qty 1

## 2017-04-29 MED ORDER — LIDOCAINE 2% (20 MG/ML) 5 ML SYRINGE
INTRAMUSCULAR | Status: AC
  Filled 2017-04-29: qty 5

## 2017-04-29 MED ORDER — LIDOCAINE 2% (20 MG/ML) 5 ML SYRINGE
INTRAMUSCULAR | Status: DC | PRN
  Administered 2017-04-29: 25 mg via INTRAVENOUS

## 2017-04-29 MED ORDER — DEXAMETHASONE SODIUM PHOSPHATE 10 MG/ML IJ SOLN
INTRAMUSCULAR | Status: AC
Start: 2017-04-29 — End: ?
  Filled 2017-04-29: qty 1

## 2017-04-29 MED ORDER — TRAMADOL HCL 50 MG PO TABS: 50.0000 mg | ORAL_TABLET | Freq: Four times a day (QID) | ORAL | 0 refills | Status: DC | PRN

## 2017-04-29 SURGICAL SUPPLY — 24 items
DRSG TEGADERM 4X4.75 (GAUZE/BANDAGES/DRESSINGS) ×4 IMPLANT
DRSG TEGADERM 8X12 (GAUZE/BANDAGES/DRESSINGS) ×4 IMPLANT
DRSG TELFA 3X8 NADH (GAUZE/BANDAGES/DRESSINGS) ×4 IMPLANT
GAUZE SPONGE 4X4 12PLY STRL LF (GAUZE/BANDAGES/DRESSINGS) ×3 IMPLANT
GLOVE BIO SURGEON STRL SZ7.5 (GLOVE) ×6 IMPLANT
GOWN STRL REUS W/TWL LRG LVL3 (GOWN DISPOSABLE) ×3 IMPLANT
IMPL SPACEOAR SYSTEM 10ML (MISCELLANEOUS) ×2 IMPLANT
IMPLANT SPACEOAR SYSTEM 10ML (MISCELLANEOUS) ×8
KIT RM TURNOVER CYSTO AR (KITS) ×4 IMPLANT
MARKER GOLD PRELOAD 1.2X3 (Urological Implant) ×2 IMPLANT
NDL SAFETY ECLIPSE 18X1.5 (NEEDLE) IMPLANT
NDL SPNL 22GX7 QUINCKE BK (NEEDLE) IMPLANT
NEEDLE HYPO 18GX1.5 SHARP (NEEDLE)
NEEDLE SPNL 22GX7 QUINCKE BK (NEEDLE) IMPLANT
PACK CYSTO (CUSTOM PROCEDURE TRAY) ×4 IMPLANT
PAD DRESSING TELFA 3X8 NADH (GAUZE/BANDAGES/DRESSINGS) ×2 IMPLANT
SEED GOLD PRELOAD 1.2X3 (Urological Implant) ×4 IMPLANT
SURGILUBE 2OZ TUBE FLIPTOP (MISCELLANEOUS) ×4 IMPLANT
SYR 20CC LL (SYRINGE) IMPLANT
TOWEL OR 17X24 6PK STRL BLUE (TOWEL DISPOSABLE) ×4 IMPLANT
TUBE CONNECTING 12'X1/4 (SUCTIONS)
TUBE CONNECTING 12X1/4 (SUCTIONS) IMPLANT
UNDERPAD 30X30 INCONTINENT (UNDERPADS AND DIAPERS) ×8 IMPLANT
WATER STERILE IRR 500ML POUR (IV SOLUTION) ×4 IMPLANT

## 2017-04-29 NOTE — Transfer of Care (Signed)
Immediate Anesthesia Transfer of Care Note  Patient: Robert Schaefer  Procedure(s) Performed: GOLD SEED IMPLANT (N/A Prostate) SPACE OAR INSTILLATION (N/A Rectum)  Patient Location: PACU  Anesthesia Type:MAC  Level of Consciousness: awake, alert , oriented and patient cooperative  Airway & Oxygen Therapy: Patient Spontanous Breathing and Patient connected to nasal cannula oxygen  Post-op Assessment: Report given to RN and Post -op Vital signs reviewed and stable  Post vital signs: Reviewed and stable  Last Vitals:  Vitals:   04/29/17 0623  BP: (!) 158/56  Pulse: 98  Resp: 16  Temp: (!) 36.3 C  SpO2: 95%    Last Pain:  Vitals:   04/29/17 0623  TempSrc: Oral      Patients Stated Pain Goal: 1 (05/20/61 4469)  Complications: No apparent anesthesia complications

## 2017-04-29 NOTE — Brief Op Note (Signed)
04/29/2017  9:10 AM  PATIENT:  Robert Schaefer  76 y.o. male  PRE-OPERATIVE DIAGNOSIS:  PROSTATE CANCER  POST-OPERATIVE DIAGNOSIS:  PROSTATE CANCER  PROCEDURE:  Procedure(s): GOLD SEED IMPLANT (N/A) SPACE OAR INSTILLATION (N/A)  SURGEON:  Surgeon(s) and Role:    * [name], MD - Primary    * Tyler Pita, MD - Assisting  PHYSICIAN ASSISTANT:   ASSISTANTS: none   ANESTHESIA:   MAC  EBL: minimal  BLOOD ADMINISTERED:none  DRAINS: none   LOCAL MEDICATIONS USED:  NONE  SPECIMEN:  No Specimen  DISPOSITION OF SPECIMEN:  N/A  COUNTS:  YES  TOURNIQUET:  * No tourniquets in log *  DICTATION: .Other Dictation: Dictation Number [id number]  PLAN OF CARE: Discharge to home after PACU  PATIENT DISPOSITION:  PACU - hemodynamically stable.   Delay start of Pharmacological VTE agent (>24hrs) due to surgical blood loss or risk of bleeding: yes

## 2017-04-29 NOTE — Discharge Instructions (Signed)
1 - You may have urinary urgency (bladder spasms) and bloody urine on / off x few days. This is normal.  2 - Restart Plavix on Monday.  3 - Call MD or go to ER for fever >102, severe pain / nausea / vomiting not relieved by medications, or acute change in medical status  Call your surgeon if you experience:   1.  Fever over 101.0. 2.  Inability to urinate. 3.  Nausea and/or vomiting. 4.  Extreme swelling or bruising at the surgical site. 5.  Continued bleeding from the incision. 6.  Increased pain, redness or drainage from the incision. 7.  Problems related to your pain medication. 8.  Any problems and/or concerns   Post Anesthesia Home Care Instructions  Activity: Get plenty of rest for the remainder of the day. A responsible individual must stay with you for 24 hours following the procedure.  For the next 24 hours, DO NOT: -Drive a car -Paediatric nurse -Drink alcoholic beverages -Take any medication unless instructed by your physician -Make any legal decisions or sign important papers.  Meals: Start with liquid foods such as gelatin or soup. Progress to regular foods as tolerated. Avoid greasy, spicy, heavy foods. If nausea and/or vomiting occur, drink only clear liquids until the nausea and/or vomiting subsides. Call your physician if vomiting continues.  Special Instructions/Symptoms: Your throat may feel dry or sore from the anesthesia or the breathing tube placed in your throat during surgery. If this causes discomfort, gargle with warm salt water. The discomfort should disappear within 24 hours.  If you had a scopolamine patch placed behind your ear for the management of post- operative nausea and/or vomiting:  1. The medication in the patch is effective for 72 hours, after which it should be removed.  Wrap patch in a tissue and discard in the trash. Wash hands thoroughly with soap and water. 2. You may remove the patch earlier than 72 hours if you experience unpleasant  side effects which may include dry mouth, dizziness or visual disturbances. 3. Avoid touching the patch. Wash your hands with soap and water after contact with the patch.

## 2017-04-29 NOTE — H&P (Signed)
Robert Schaefer is an 76 y.o. male.    Chief Complaint: Pre-Op Fiducial Marker / Space-OAR Placement  HPI:   1 - High Risk Prostate Cancer - Gleason 4+5=9 disease by BX 08/2016 on eval rising PSA to 7.61, TRUS 88m with some medain lobe at age 76 CT and Bone Scan clinicall localized. H   Recent Course:  01/2017 PSA 7.99 ==> Wants to proceed with 2 years ADT + XRT under care of Drs MTresa Mooreand MTammi Schaefer    2 - Lower Urinary Tract Symptom - on alfuzosin for mix of irritative and obstructive symptoms with good control    PMH sig for superficial thrombophlebitis/Plavix (follows JLeonie DouglasCards), mild dementia. NO ischemic CV disease. He lives independantly with his wife of >50years. His PCP is Robert McGowen MD   Today "[name] is seen ito proceed with fiducial marker and SPACE-OAR placement in preparation for primary radiation therapy.  No interval fevers. Most recetn UA without infectious parameters.    Past Medical History:  Diagnosis Date  . [place]    found by Robert Schaefer at periphral vascular catherization w/ stenting 05-29-2016--- duplex 06-09-2016 distal AAA measures 3.0cm x 3.1cm  . Anticoagulant long-term use    plavix  . Carotid stenosis, asymptomatic, bilateral    per duplex --- bilateral <50% ICA  . COPD with emphysema ([facility]    pulmologist-  dr aElsworth Schaefer . Dyspnea on exertion   . First degree heart block   . GERD (gastroesophageal reflux disease)   . Heart murmur   . Hiatal hernia   . HOH (hard of hearing)   . Hyperlipidemia   . Hypertension    pt stopped taking losartan apprx. April 2018 , per pt pcp instructed him to stop  . Hypothyroidism   . IBS (irritable bowel syndrome)    intermittant constipation  . Lesion of left lung    followed by dr aElsworth Schaefer(pulmologist)  post infection scarring  . Lower urinary tract symptoms (LUTS)   . Mild dementia   . Moderate aortic stenosis    per last echo --  valve area 1.1cm^2 ,    . OSA (obstructive sleep  apnea)    has a cpap-  intolerant  . Peripheral vascular disease ([facility]    cardiologist-- dr Robert Schaefer-- per last ABI/Aorta/Iliac 06-09-2016  patent bilateral iliac kissing stents, >50% stenosis bilateral iliac artery (R>L) and left external iliac artery, patent IVC  . Prostate cancer ([facility] urologist-  dr mTresa Schaefer oncologist-  dr mTammi Schaefer  dx 03/ 2018-- Stage T1c, Gleason 4+5, PSA 8.15, vol 36cc--- currently ADT and planned external radiation  . RLS (restless legs syndrome)   . Schatzki's ring   . Wears glasses   . Wears partial dentures    upper    Past Surgical History:  Procedure Laterality Date  . CARDIOVASCULAR STRESS TEST  04-23-2016   dr Robert Schaefer   normal nuclear study w/ no ischemia/  normal LV function and wall function, nuclear stress ef 57%  . CATARACT EXTRACTION W/ INTRAOCULAR LENS  IMPLANT, BILATERAL Bilateral   . COLONOSCOPY    . ESOPHAGOGASTRODUODENOSCOPY (EGD) WITH ESOPHAGEAL DILATION  last one 11-09-2015  dr hYolanda Schaefer  dilation schatzki's ring  . HERNIA REPAIR    . INGUINAL HERNIA REPAIR Right 02/23/2014   Procedure: HERNIA REPAIR INGUINAL ADULT;  Surgeon: TJoyice Faster Cornett, MD;  [facility] [facility]  Service: General;  Laterality: Right;  . INSERTION OF MESH Right 02/23/2014   Procedure:  INSERTION OF MESH;  Surgeon: Robert Faster. Cornett, MD;  Location: [place];  Service: General;  Laterality: Right;  . PERIPHERAL VASCULAR CATHETERIZATION N/A 05/29/2016   Procedure: Lower Extremity Angiography;  Surgeon: Robert Harp, MD;  Location: [place] [facility];  Service: Cardiovascular;  Laterality: N/A; [place] orbital rotational atherectomy, PTA and stenting bilateral common iliac arteries   . PILONIDAL CYST / SINUS EXCISION  2000  . PROSTATE BIOPSY  09-22-2016  dr Robert Schaefer office  . TRANSTHORACIC ECHOCARDIOGRAM  05-05-2016   dr Robert Schaefer   ef 74-25%,  grade 1diastolic dysfunction/  moderate AV stenosis with mod. AR (right coronary and noncoronary cusp  mobility seversly restricted);  valve area 1.25cm^2, mean gradient 27 mmHg, peak gradient 62mHg/  mild MR  . TREATMENT FISTULA ANAL  2010    Family History  Problem Relation Age of Onset  . Heart attack Mother   . Stroke Father   . Asthma Father   . Cancer Neg Hx    Social History:  reports that he quit smoking about 11 years ago. His smoking use included Cigarettes. He has a 50.00 pack-year smoking history. He has never used smokeless tobacco. He reports that he does not drink alcohol or use drugs.  Allergies:  Allergies  Allergen Reactions  . Penicillins Anaphylaxis, Itching and Swelling    Has patient had a PCN reaction causing immediate rash, facial/tongue/throat swelling, SOB or lightheadedness with hypotension: Yes Has patient had a PCN reaction causing severe rash involving mucus membranes or skin necrosis: No Has patient had a PCN reaction that required hospitalization: Yes Has patient had a PCN reaction occurring within the last 10 years: No If all of the above answers are "NO", then may proceed with Cephalosporin use.   . Sulfa Antibiotics Anaphylaxis and Swelling  . Versed [Midazolam] Other (See Comments)    Confusion, delerium    Medications Prior to Admission  Medication Sig Dispense Refill  . albuterol (PROVENTIL HFA;VENTOLIN HFA) 108 (90 Base) MCG/ACT inhaler Inhale 2 puffs into the lungs every 6 (six) hours as needed for wheezing or shortness of breath. 1 Inhaler 6  . aspirin EC 81 MG tablet Take 81 mg by mouth daily.    . Cholecalciferol (VITAMIN D3) 1000 units CAPS Take 2 capsules by mouth daily. Takes along with 5000 units to make a total of 7000 units per day    . Cholecalciferol (VITAMIN D3) 5000 units CAPS Take 1 capsule by mouth daily. Takes along with 2000 units to make a total of 7000 units per day    . fluticasone (FLONASE) 50 MCG/ACT nasal spray Place 2 sprays into both nostrils daily. (Patient taking differently: Place 2 sprays into both nostrils daily  as needed for allergies. ) 16 g 3  . levothyroxine (SYNTHROID, LEVOTHROID) 50 MCG tablet Take 1 tablet (50 mcg total) by mouth daily. 90 tablet 1  . clopidogrel (PLAVIX) 75 MG tablet Take 1 tablet (75 mg total) by mouth daily with breakfast. 30 tablet 11    Results for orders placed or performed during the hospital encounter of 04/27/17 (from the past 48 hour(s))  CBC with Differential     Status: Abnormal   Collection Time: 04/27/17 11:14 AM  Result Value Ref Range   WBC 7.5 4.0 - 10.5 K/uL   RBC 4.28 4.22 - 5.81 MIL/uL   Hemoglobin 14.3 13.0 - 17.0 g/dL   HCT 40.2 39.0 - 52.0 %   MCV 93.9 78.0 - 100.0 fL   MCH  33.4 26.0 - 34.0 pg   MCHC 35.6 30.0 - 36.0 g/dL   RDW 14.0 11.5 - 15.5 %   Platelets 139 (L) 150 - 400 K/uL   Neutrophils Relative % 67 %   Neutro Abs 5.0 1.7 - 7.7 K/uL   Lymphocytes Relative 23 %   Lymphs Abs 1.7 0.7 - 4.0 K/uL   Monocytes Relative 6 %   Monocytes Absolute 0.5 0.1 - 1.0 K/uL   Eosinophils Relative 3 %   Eosinophils Absolute 0.3 0.0 - 0.7 K/uL   Basophils Relative 1 %   Basophils Absolute 0.1 0.0 - 0.1 K/uL  Comprehensive metabolic panel     Status: Abnormal   Collection Time: 04/27/17 11:14 AM  Result Value Ref Range   Sodium 138 135 - 145 mmol/L   Potassium 4.3 3.5 - 5.1 mmol/L   Chloride 101 101 - 111 mmol/L   CO2 28 22 - 32 mmol/L   Glucose, Bld 109 (H) 65 - 99 mg/dL   BUN 19 6 - 20 mg/dL   Creatinine, Ser 1.02 0.61 - 1.24 mg/dL   Calcium 9.9 8.9 - 10.3 mg/dL   Total Protein 7.6 6.5 - 8.1 g/dL   Albumin 4.2 3.5 - 5.0 g/dL   AST 19 15 - 41 U/L   ALT 15 (L) 17 - 63 U/L   Alkaline Phosphatase 61 38 - 126 U/L   Total Bilirubin 0.9 0.3 - 1.2 mg/dL   GFR calc non Af Amer >60 >60 mL/min   GFR calc Af Amer >60 >60 mL/min    Comment: (NOTE) The eGFR has been calculated using the CKD EPI equation. This calculation has not been validated in all clinical situations. eGFR's persistently <60 mL/min signify possible Chronic Kidney Disease.    Anion  gap 9 5 - 15  Lipase, blood     Status: None   Collection Time: 04/27/17 11:14 AM  Result Value Ref Range   Lipase 32 11 - 51 U/L  I-stat troponin, ED     Status: None   Collection Time: 04/27/17 11:34 AM  Result Value Ref Range   Troponin i, poc 0.00 0.00 - 0.08 ng/mL   Comment 3            Comment: Due to the release kinetics of cTnI, a negative result within the first hours of the onset of symptoms does not rule out myocardial infarction with certainty. If myocardial infarction is still suspected, repeat the test at appropriate intervals.   I-stat troponin, ED     Status: None   Collection Time: 04/27/17  2:35 PM  Result Value Ref Range   Troponin i, poc 0.01 0.00 - 0.08 ng/mL   Comment 3            Comment: Due to the release kinetics of cTnI, a negative result within the first hours of the onset of symptoms does not rule out myocardial infarction with certainty. If myocardial infarction is still suspected, repeat the test at appropriate intervals.   Troponin I-serum (0, 3, 6 hours)     Status: None   Collection Time: 04/27/17  8:23 PM  Result Value Ref Range   Troponin I <0.03 <0.03 ng/mL  Troponin I-serum (0, 3, 6 hours)     Status: None   Collection Time: 04/27/17 11:21 PM  Result Value Ref Range   Troponin I <0.03 <0.03 ng/mL  Troponin I-serum (0, 3, 6 hours)     Status: None   Collection Time: 04/28/17  1:13 AM  Result Value Ref Range   Troponin I <0.03 <0.03 ng/mL   Dg Chest 2 View  Result Date: 04/27/2017 CLINICAL DATA:  Syncope. EXAM: CHEST  2 VIEW COMPARISON:  Radiograph of March 27, 2016. FINDINGS: The heart size and mediastinal contours are within normal limits. Both lungs are clear. No pneumothorax or pleural effusion is noted. Atherosclerosis of thoracic aorta is noted. The visualized skeletal structures are unremarkable. IMPRESSION: No active cardiopulmonary disease.  Aortic atherosclerosis. Electronically Signed   By: Marijo Conception, M.D.   On:  04/27/2017 09:52    Review of Systems  Constitutional: Negative.  Negative for chills and fever.  HENT: Negative.   Eyes: Negative.   Respiratory: Negative.   Cardiovascular: Negative.   Gastrointestinal: Negative.   Genitourinary: Negative.   Musculoskeletal: Negative.   Skin: Negative.   Neurological: Negative.   Endo/Heme/Allergies: Negative.   Psychiatric/Behavioral: Negative.     Blood pressure (!) 158/56, pulse 98, temperature (!) 97.4 F (36.3 C), temperature source Oral, resp. rate 16, height _0  (1.676 m), weight 64.6 kg (142 lb 8 oz), SpO2 95 %. Physical Exam  Constitutional: He appears well-developed.  HENT:  Head: Normocephalic.  Eyes: Pupils are equal, round, and reactive to light.  Neck: Normal range of motion.  Cardiovascular: Normal rate.   Respiratory: Effort normal.  GI: Soft.  Genitourinary:  Genitourinary Comments: No CVAT  Musculoskeletal: Normal range of motion.  Neurological: He is alert.  Skin: Skin is warm.  Psychiatric: He has a normal mood and affect.     Assessment/Plan  Proceed as planned with fiducial marker and space-oar placement. Risks, benefits, alternatives, expected peri-op course discussed previously and reiterated today.   [name], MD 04/29/2017, 6:37 AM

## 2017-04-29 NOTE — Telephone Encounter (Signed)
CALLED PATIENT TO REMIND OF SIM APPT. FOR 05-01-17 AND HIS MRI ON 05-01-17 @ 5 PM @ [facility], NO RESTRICTIONS TO TEST, LVM FOR A RETURN CALL

## 2017-04-29 NOTE — Anesthesia Preprocedure Evaluation (Addendum)
Anesthesia Evaluation  Patient identified by MRN, date of birth, ID band Patient awake    Reviewed: Allergy & Precautions  Airway Mallampati: III  TM Distance: >3 FB     Dental  (+) Teeth Intact, Partial Upper, Dental Advisory Given, Poor Dentition   Pulmonary asthma , sleep apnea , COPD, former smoker,    breath sounds clear to auscultation       Cardiovascular hypertension, Pt. on medications + Peripheral Vascular Disease  + dysrhythmias + Valvular Problems/Murmurs  Rhythm:Regular Rate:Normal     Neuro/Psych    GI/Hepatic hiatal hernia, GERD  Medicated and Controlled,  Endo/Other  Hypothyroidism   Renal/GU      Musculoskeletal   Abdominal   Peds  Hematology   Anesthesia Other Findings   Reproductive/Obstetrics                          Anesthesia Physical Anesthesia Plan  ASA: III  Anesthesia Plan: General   Post-op Pain Management:    Induction: Intravenous  PONV Risk Score and Plan: 2 and Ondansetron, Dexamethasone and Treatment may vary due to age or medical condition  Airway Management Planned: LMA  Additional Equipment:   Intra-op Plan:   Post-operative Plan: Extubation in OR  Informed Consent: I have reviewed the patients History and Physical, chart, labs and discussed the procedure including the risks, benefits and alternatives for the proposed anesthesia with the patient or authorized representative who has indicated his/her understanding and acceptance.   Dental advisory given  Plan Discussed with: CRNA and Anesthesiologist  Anesthesia Plan Comments:        Anesthesia Quick Evaluation

## 2017-04-29 NOTE — Op Note (Signed)
NAME:  Robert Schaefer, Robert Schaefer NO.:  MEDICAL RECORD NO.:  [id number]  LOCATION:                                 FACILITY:  PHYSICIAN:  [name], MD          DATE OF BIRTH:  DATE OF PROCEDURE:                              OPERATIVE REPORT   DIAGNOSIS:  High-risk adenocarcinoma of the prostate.  PROCEDURE: 1. Transrectal ultrasound with interpretation. 2. Fiducial marker placement for prostate radiation. 3. SpaceOAR placement.  ESTIMATED BLOOD LOSS:  Nil.  COMPLICATION:  None.  SPECIMEN:  None.  FINDINGS: 1. Unremarkable prostate, no hypoechoic lesions.  Mild median lobe     noted. 2. Successful placement of fiducial markers, right mid base, right mid     apex, left lateral mid. 3. Successful placement of a 10 mL of SpaceOAR prerectal gel matrix.  INDICATION:  [name] is a pleasant and quite vigorous 76 year old gentleman, who was found to have high-risk adenocarcinoma of the prostate with multifocal Gleason 9 disease on workup of elevated and trending of PSA.  He has elected to undergo primary curative therapy with 2 years of androgen deprivation with primary external beam radiation.  As a part of his external beam radiation, the patient and the Radiation Oncology have requested fiducial marker placement and SpaceOAR gel matrix placement to help reduce the risk of rectal toxicity.  Informed consent was obtained and placed in the medical record.  PROCEDURE IN DETAIL:  The patient being, Robert Schaefer, was verified. Procedure being, fiducial marker placement and SpaceOAR placement, were confirmed.  Procedure was carried out.  Time-out was performed. Intravenous antibiotics were administered.  Monitored anesthesia care, conscious sedation were delivered.  The patient was placed into a medium lithotomy position.  His scrotum and penis were prepped out of the perineum.  The perineum was prepped using iodine.  The Stepper apparatus transrectal  ultrasound probe was introduced.  Transrectal ultrasound revealed approximately 35 mm prostate, no hypoechoic areas.  There was a modest median lobe as per prior.  Attention was then directed at transperineal placement of fiducial markers.  Initial fiducial marker was placed right base mid approximately 8 mm lateral to the expected course of the bladder neck and urethra; a second one, right apex mid approximately 8 mm lateral to the expected course of the urethra; and final one, left mid far lateral.  The SpaceOAR introducer needle was placed in the midline location with the tip being just in the perirectal fat.  Saline 1 mL was introduced and this was confirmed proper alignment with hydrodissection.  SpaceOAR gel matrix 3 mL was placed into this location resulted in excellent SpaceOAR dissection; however, the needle at this point, became somewhat clogged, not allowing further placement of gel matrix for a second gel matrix load was prepared and additional 7 mL applied in the same location for 10 mL total, which resulted in excellent displacement of the anterior rectal wall away from the posterior aspect of the prostate as per plan.  All needles were removed.  Hemostasis appeared excellent.  Procedure was terminated.  The patient tolerated procedure well.  There were no immediate  periprocedural complications.  The patient was taken to the postanesthesia care unit in stable condition.          ______________________________ [name], MD     TM/MEDQ  D:  04/29/2017  T:  04/29/2017  Job:  [id number]

## 2017-04-29 NOTE — Anesthesia Postprocedure Evaluation (Signed)
Anesthesia Post Note  Patient: Robert Schaefer  Procedure(s) Performed: GOLD SEED IMPLANT (N/A Prostate) SPACE OAR INSTILLATION (N/A Rectum)     Patient location during evaluation: PACU Anesthesia Type: MAC Level of consciousness: awake Pain management: pain level controlled Vital Signs Assessment: post-procedure vital signs reviewed and stable Respiratory status: spontaneous breathing Cardiovascular status: stable Anesthetic complications: no    Last Vitals:  Vitals:   04/29/17 0930 04/29/17 0945  BP: (!) 125/32 (!) 140/43  Pulse: (!) 58 (!) 57  Resp: 14 15  Temp:    SpO2:  92%    Last Pain:  Vitals:   04/29/17 0623  TempSrc: Oral                 Xaiver Roskelley

## 2017-04-30 ENCOUNTER — Encounter (HOSPITAL_BASED_OUTPATIENT_CLINIC_OR_DEPARTMENT_OTHER): Payer: Self-pay | Admitting: Urology

## 2017-04-30 NOTE — Anesthesia Postprocedure Evaluation (Signed)
Anesthesia Post Note  Patient: Robert Schaefer  Procedure(s) Performed: GOLD SEED IMPLANT (N/A Prostate) SPACE OAR INSTILLATION (N/A Rectum)     Patient location during evaluation: PACU Anesthesia Type: MAC Level of consciousness: awake Pain management: pain level controlled Vital Signs Assessment: post-procedure vital signs reviewed and stable Respiratory status: spontaneous breathing Cardiovascular status: stable Anesthetic complications: no    Last Vitals:  Vitals:   04/29/17 1015 04/29/17 1040  BP:  (!) 152/36  Pulse: (!) 55 (!) 57  Resp: 17 16  Temp:  36.8 C  SpO2: 95% 95%    Last Pain:  Vitals:   04/29/17 1040  TempSrc: Oral   Pain Goal: Patients Stated Pain Goal: 1 (04/29/17 0700)               Princesa Willig

## 2017-05-01 ENCOUNTER — Ambulatory Visit (HOSPITAL_COMMUNITY): Payer: Medicare Other

## 2017-05-01 ENCOUNTER — Ambulatory Visit: Payer: Medicare Other | Admitting: Radiation Oncology

## 2017-05-04 ENCOUNTER — Telehealth: Payer: Self-pay | Admitting: Cardiovascular Disease

## 2017-05-04 NOTE — Telephone Encounter (Signed)
° °  [facility] Pre-operative Risk Assessment    Request for surgical clearance:  1. What type of surgery is being performed? Dental Extraction 2. When is this surgery scheduled? Pending   3. Are there any medications that need to be held prior to surgery and how long?Should pt hold his Plavix and if so how long? Does pt need antibiotics   4. Practice name and name of physician performing surgery? Dr Arita Miss  5. What is your office phone and fax number? [phone] and fax is [phone]   6. Anesthesia type (None, local, MAC, general) ?Local   Glyn Ade 05/04/2017, 9:46 AM  _________________________________________________________________   (provider comments below)

## 2017-05-04 NOTE — Telephone Encounter (Signed)
   Chart reviewed as part of pre-operative protocol coverage. Given past medical history and time since last visit, based on ACC/AHA guidelines, Robert Schaefer would be at acceptable risk for the planned procedure without further cardiovascular testing.   A dental extraction is low-risk from a cardiac perspective. Plavix should not need to be held for a simple extraction. If multiple extractions, can hold Plavix for 5 days. She does not require antibiotics prior to the procedure.   [name], PA-C 05/04/2017, 3:06 PM

## 2017-05-05 ENCOUNTER — Ambulatory Visit
Admission: RE | Admit: 2017-05-05 | Discharge: 2017-05-05 | Disposition: A | Discharge status: Home / Self Care | Payer: Medicare Other | Source: Ambulatory Visit | Attending: Radiation Oncology | Admitting: Radiation Oncology

## 2017-05-05 ENCOUNTER — Telehealth: Payer: Self-pay | Admitting: *Deleted

## 2017-05-05 ENCOUNTER — Ambulatory Visit (HOSPITAL_COMMUNITY)
Admission: RE | Admit: 2017-05-05 | Discharge: 2017-05-05 | Disposition: A | Discharge status: Home / Self Care | Payer: Medicare Other | Source: Ambulatory Visit | Attending: Urology | Admitting: Urology

## 2017-05-05 DIAGNOSIS — C61 Malignant neoplasm of prostate: Secondary | ICD-10-CM | POA: Diagnosis not present

## 2017-05-05 DIAGNOSIS — E785 Hyperlipidemia, unspecified: Secondary | ICD-10-CM | POA: Diagnosis not present

## 2017-05-05 DIAGNOSIS — Z95820 Peripheral vascular angioplasty status with implants and grafts: Secondary | ICD-10-CM | POA: Diagnosis not present

## 2017-05-05 DIAGNOSIS — Z510 Encounter for antineoplastic radiation therapy: Secondary | ICD-10-CM | POA: Diagnosis not present

## 2017-05-05 DIAGNOSIS — J449 Chronic obstructive pulmonary disease, unspecified: Secondary | ICD-10-CM | POA: Diagnosis not present

## 2017-05-05 DIAGNOSIS — I739 Peripheral vascular disease, unspecified: Secondary | ICD-10-CM | POA: Diagnosis not present

## 2017-05-05 NOTE — Telephone Encounter (Signed)
CALLED PT & SCHEDULED HIM FOR A HOSP FU ON 11/8 WITH DR MCK= SB  PT AWARE

## 2017-05-07 ENCOUNTER — Telehealth: Payer: Self-pay | Admitting: Cardiovascular Disease

## 2017-05-07 ENCOUNTER — Ambulatory Visit (INDEPENDENT_AMBULATORY_CARE_PROVIDER_SITE_OTHER): Payer: Medicare Other | Admitting: Internal Medicine

## 2017-05-07 VITALS — BP 128/68 | HR 76 | Temp 97.3°F | Resp 16 | Ht 65.0 in | Wt 145.0 lb

## 2017-05-07 DIAGNOSIS — R7303 Prediabetes: Secondary | ICD-10-CM | POA: Diagnosis not present

## 2017-05-07 DIAGNOSIS — K222 Esophageal obstruction: Secondary | ICD-10-CM | POA: Diagnosis not present

## 2017-05-07 DIAGNOSIS — K219 Gastro-esophageal reflux disease without esophagitis: Secondary | ICD-10-CM

## 2017-05-07 DIAGNOSIS — Z79899 Other long term (current) drug therapy: Secondary | ICD-10-CM | POA: Diagnosis not present

## 2017-05-07 DIAGNOSIS — I10 Essential (primary) hypertension: Secondary | ICD-10-CM

## 2017-05-07 DIAGNOSIS — R55 Syncope and collapse: Secondary | ICD-10-CM

## 2017-05-07 DIAGNOSIS — E039 Hypothyroidism, unspecified: Secondary | ICD-10-CM

## 2017-05-07 LAB — CBC WITH DIFFERENTIAL/PLATELET
BASOS ABS: 55 {cells}/uL (ref 0–200)
BASOS PCT: 0.5 %
EOS ABS: 209 {cells}/uL (ref 15–500)
Eosinophils Relative: 1.9 %
HCT: 35 % — ABNORMAL LOW (ref 38.5–50.0)
HEMOGLOBIN: 12.5 g/dL — AB (ref 13.2–17.1)
Lymphs Abs: 1551 cells/uL (ref 850–3900)
MCH: 32.4 pg (ref 27.0–33.0)
MCHC: 35.7 g/dL (ref 32.0–36.0)
MCV: 90.7 fL (ref 80.0–100.0)
MPV: 10.9 fL (ref 7.5–12.5)
Monocytes Relative: 7.4 %
Neutro Abs: 8371 cells/uL — ABNORMAL HIGH (ref 1500–7800)
Neutrophils Relative %: 76.1 %
PLATELETS: 148 10*3/uL (ref 140–400)
RBC: 3.86 10*6/uL — ABNORMAL LOW (ref 4.20–5.80)
RDW: 13.1 % (ref 11.0–15.0)
TOTAL LYMPHOCYTE: 14.1 %
WBC: 11 10*3/uL — ABNORMAL HIGH (ref 3.8–10.8)
WBCMIX: 814 {cells}/uL (ref 200–950)

## 2017-05-07 LAB — BASIC METABOLIC PANEL WITH GFR
BUN: 20 mg/dL (ref 7–25)
CO2: 30 mmol/L (ref 20–32)
CREATININE: 0.9 mg/dL (ref 0.70–1.18)
Calcium: 9.7 mg/dL (ref 8.6–10.3)
Chloride: 103 mmol/L (ref 98–110)
GFR, EST AFRICAN AMERICAN: 95 mL/min/{1.73_m2} (ref >=60)
GFR, Est Non African American: 82 mL/min/{1.73_m2} (ref >=60)
Glucose, Bld: 164 mg/dL — ABNORMAL HIGH (ref 65–99)
Potassium: 4.9 mmol/L (ref 3.5–5.3)
Sodium: 140 mmol/L (ref 135–146)

## 2017-05-07 NOTE — Telephone Encounter (Signed)
Received in triage, called to clarify-this will be a single tooth extraction and date is TBD.   Routed for review

## 2017-05-07 NOTE — Patient Instructions (Signed)

## 2017-05-07 NOTE — Telephone Encounter (Signed)
New Message     [facility] Pre-operative Risk Assessment    Request for surgical clearance:  1. What type of surgery is being performed? Tooth extraction   2. When is this surgery scheduled?   3. Are there any medications that need to be held prior to surgery and how long? Blood thinner length of time TBA  4. Practice name and name of physician performing surgery? Dr. Paulo FruitRonnald Ramp   5. What is your office phone and fax number? [phone] fax [phone]  6. Anesthesia type (None, local, MAC, general) ? local   Robert Schaefer 05/07/2017, 9:04 AM  _________________________________________________________________   (provider comments below)

## 2017-05-08 NOTE — Telephone Encounter (Signed)
The patient is cleared at acceptable risk by Bernerd Pho per telephone note 05/04/17.   Will route to Dr. [name] to review ASA and plavix. Last PCI 05/29/16.  Endre Coutts, [place]

## 2017-05-09 ENCOUNTER — Encounter: Payer: Self-pay | Admitting: Internal Medicine

## 2017-05-09 NOTE — Progress Notes (Signed)
Robert Schaefer     This very nice 76 y.o. WWM was admitted 10/29-10/30/2018 for Near Syncope, Hx/o AoS and serial cardiac enzymes and telemetry monitoring were unremarkable. Pt's vital signs remained stable. On 10/31/201, patient had out-patient radium seed implantation  By Dr [name] for Prostate Ca. Patient presents for post hospital follow up and denies any interim issues with dizziness or lightheadedness. Apparently patient has has long standing issues with dysphagia and had been followed in the past by [facility] GI, but Patient and his daughter admantly request to have GI consultation elsewhere.   Patient was contacted post discharge by office staff to assure stability and schedule f/u.      Hospitalization discharge instructions and medications are reconciled with the patient.      Patient is also followed with HTN, HLD, Pre-Diabetes, GERD, ASPVD, GERD/stricture, Hypothyroidism, Vit D Deficiency, Prostate Ca and Vitamin D Deficiency. Patient has been followed by Dr [name] for a LUL cavitary Lung Lesion attributed to Aspiration. (CXR on 04/27/2017 was was reported clear).  Patient also has OSA on CPAP.       Patient is treated for HTN circa 1995 & BP has been controlled at home. Today's BP is at goal -  128/68.  In Nov 2017, patient had Bilat AoIliac Arthrotomy by Dr [name] and also  patient is known to have L Renal Aa Stenosis and Mesenteric Aa Stenosis. Patient has had no complaints of any cardiac type chest pain, palpitations, dyspnea/orthopnea/PND, dizziness, claudication, or dependent edema.     Hyperlipidemia is controlled with diet & meds. Patient denies myalgias or other med SE's. Last Lipids were at goal: Lab Results  Component Value Date   CHOL 141 02/26/2017   HDL 50 02/26/2017   LDLCALC 52 09/15/2016   TRIG 58 02/26/2017   CHOLHDL 2.8 02/26/2017      Also, the patient has history of PreDiabetes (A1c 6.1% / 2012 then 5.6% / 2016)  and has had no symptoms of reactive  hypoglycemia, diabetic polys, paresthesias or visual blurring.  Last A1c was Normal and at goal: Lab Results  Component Value Date   HGBA1C 4.9 09/15/2016      Patient has been on Thyroid Replacement since 2014.  Further, the patient also has history of Vitamin D Deficiency and supplements vitamin D without any suspected side-effects. Last vitamin D was at goal:   Lab Results  Component Value Date   VD25OH 86 09/15/2016   Current Outpatient Medications on File Prior to Visit  Medication Sig  . albuterol (PROVENTIL HFA;VENTOLIN HFA) 108 (90 Base) MCG/ACT inhaler Inhale 2 puffs into the lungs every 6 (six) hours as needed for wheezing or shortness of breath.  [name] aspirin EC 81 MG tablet Take 81 mg by mouth daily.  . Cholecalciferol (VITAMIN D3) 1000 units CAPS Take 2 capsules by mouth daily. Takes along with 5000 units to make a total of 7000 units per day  . clopidogrel (PLAVIX) 75 MG tablet Take 75 mg daily by mouth.  . fluticasone (FLONASE) 50 MCG/ACT nasal spray Place 2 sprays into both nostrils daily. (Patient taking differently: Place 2 sprays into both nostrils daily as needed for allergies. )  . levothyroxine (SYNTHROID, LEVOTHROID) 50 MCG tablet Take 1 tablet (50 mcg total) by mouth daily.   No current facility-administered medications on file prior to visit.    Allergies  Allergen Reactions  . Penicillins Anaphylaxis, Itching and Swelling  . Sulfa Antibiotics Anaphylaxis and Swelling  . Versed [Midazolam] Other (See  Comments)    Confusion, delerium   PMHx:   Past Medical History:  Diagnosis Date  . AAA (abdominal aortic aneurysm) ([place])    found by berry at periphral vascular catherization w/ stenting 05-29-2016--- duplex 06-09-2016 distal AAA measures 3.0cm x 3.1cm  . Anticoagulant long-term use    plavix  . Carotid stenosis, asymptomatic, bilateral    per duplex --- bilateral <50% ICA  . COPD with emphysema [facility])    pulmologist-  dr [name]  . Dyspnea on exertion   . First  degree heart block   . GERD (gastroesophageal reflux disease)   . Heart murmur   . Hiatal hernia   . HOH (hard of hearing)   . Hyperlipidemia   . Hypertension    pt stopped taking losartan apprx. April 2018 , per pt pcp instructed him to stop  . Hypothyroidism   . IBS (irritable bowel syndrome)    intermittant constipation  . Lesion of left lung    followed by dr [name] (pulmologist)  post infection scarring  . Lower urinary tract symptoms (LUTS)   . Mild dementia   . Moderate aortic stenosis    per last echo --  valve area 1.1cm^2 ,    . OSA (obstructive sleep apnea)    has a cpap-  intolerant  . Peripheral vascular disease [facility])    cardiologist-- dr berry-- per last ABI/Aorta/Iliac 06-09-2016  patent bilateral iliac kissing stents, >50% stenosis bilateral iliac artery (R>L) and left external iliac artery, patent IVC  . Prostate cancer [facility]) urologist-  dr [name]  oncologist-  dr [name]   dx 03/ 2018-- Stage T1c, Gleason 4+5, PSA 8.15, vol 36cc--- currently ADT and planned external radiation  . RLS (restless legs syndrome)   . Schatzki's ring   . Wears glasses   . Wears partial dentures    upper   Immunization History  Administered Date(s) Administered  . DT 09/22/2013  . Influenza, High Dose Seasonal PF 04/11/2014, 04/20/2015  . Zoster 06/30/2005   Past Surgical History:  Procedure Laterality Date  . CARDIOVASCULAR STRESS TEST  04-23-2016   dr berry   normal nuclear study w/ no ischemia/  normal LV function and wall function, nuclear stress ef 57%  . CATARACT EXTRACTION W/ INTRAOCULAR LENS  IMPLANT, BILATERAL Bilateral   . COLONOSCOPY    . ESOPHAGOGASTRODUODENOSCOPY (EGD) WITH ESOPHAGEAL DILATION  last one 11-09-2015  dr Yolanda Bonine   dilation schatzki's ring  . HERNIA REPAIR    . PILONIDAL CYST / SINUS EXCISION  2000  . PROSTATE BIOPSY  09-22-2016  dr [name] office  . TRANSTHORACIC ECHOCARDIOGRAM  05-05-2016   dr berry   ef 21-30%,  grade 1diastolic dysfunction/  moderate  AV stenosis with mod. AR (right coronary and noncoronary cusp mobility seversly restricted);  valve area 1.25cm^2, mean gradient 27 mmHg, peak gradient 38mmHg/  mild MR  . TREATMENT FISTULA ANAL  2010   FHx:    Reviewed / unchanged  SHx:    Reviewed / unchanged  Systems Review:  Constitutional: Denies fever, chills, wt changes, headaches, insomnia, fatigue, night sweats, change in appetite. Eyes: Denies redness, blurred vision, diplopia, discharge, itchy, watery eyes.  ENT: Denies discharge, congestion, post nasal drip, epistaxis, sore throat, earache, hearing loss, dental pain, tinnitus, vertigo, sinus pain, snoring.  CV: Denies chest pain, palpitations, irregular heartbeat, syncope, dyspnea, diaphoresis, orthopnea, PND, claudication or edema. Respiratory: denies cough, dyspnea, DOE, pleurisy, hoarseness, laryngitis, wheezing.  Gastrointestinal: Denies dysphagia, odynophagia, heartburn, reflux, water brash, abdominal pain  or cramps, nausea, vomiting, bloating, diarrhea, constipation, hematemesis, melena, hematochezia  or hemorrhoids. Genitourinary: Denies dysuria, frequency, urgency, nocturia, hesitancy, discharge, hematuria or flank pain. Musculoskeletal: Denies arthralgias, myalgias, stiffness, jt. swelling, pain, limping or strain/sprain.  Skin: Denies pruritus, rash, hives, warts, acne, eczema or change in skin lesion(s). Neuro: No weakness, tremor, incoordination, spasms, paresthesia or pain. Psychiatric: Denies confusion, memory loss or sensory loss. Endo: Denies change in weight, skin or hair change.  Heme/Lymph: No excessive bleeding, bruising or enlarged lymph nodes.  Physical Exam  BP 128/68   Pulse 76   Temp (!) 97.3 F (36.3 C)   Resp 16   Ht 5\' 5"  (1.651 m)   Wt 145 lb (65.8 kg)   BMI 24.13 kg/m   Appears well nourished, well groomed  and in no distress.  Eyes: PERRLA, EOMs, conjunctiva no swelling or erythema. Sinuses: No frontal/maxillary tenderness ENT/Mouth:  EAC's clear, TM's nl w/o erythema, bulging. Nares clear w/o erythema, swelling, exudates. Oropharynx clear without erythema or exudates. Oral hygiene is good. Tongue normal, non obstructing. Hearing intact.  Neck: Supple. Thyroid nl. Car 2+/2+ without bruits, nodes or JVD. Chest: Respirations nl with BS clear & equal w/o rales, rhonchi, wheezing or stridor.  Cor: Heart sounds normal w/ regular rate and rhythm without sig. murmurs, gallops, clicks or rubs. Peripheral pulses normal and equal  without edema.  Abdomen: Soft & bowel sounds normal. Non-tender w/o guarding, rebound, hernias, masses or organomegaly.  Lymphatics: Unremarkable.  Musculoskeletal: Full ROM all peripheral extremities, joint stability, 5/5 strength and normal gait.  Skin: Warm, dry without exposed rashes, lesions or ecchymosis apparent.  Neuro: Cranial nerves intact, reflexes equal bilaterally. Sensory-motor testing grossly intact. Tendon reflexes grossly intact.  Pysch: Alert & oriented x 3.  Insight and judgement nl & appropriate. No ideations.  Assessment and Plan:  1. Near syncope  - CBC with Differential/Platelet - BASIC METABOLIC PANEL WITH GFR  2. Essential hypertension  - Continue medication, monitor blood pressure at home.  - Continue DASH diet. Reminder to go to the ER if any CP,  SOB, nausea, dizziness, severe HA, changes vision/speech.  - CBC with Differential/Platelet - BASIC METABOLIC PANEL WITH GFR  3. Prediabetes  - Continue diet, exercise, lifestyle modifications.  - Monitor appropriate labs.  - BASIC METABOLIC PANEL WITH GFR  4. Hyperlipidemia  - Continue diet/meds, exercise,& lifestyle modifications.  - Continue monitor periodic cholesterol/liver & renal functions   5. Hypothyroidism  6. Medication management  - CBC with Differential/Platelet - BASIC METABOLIC PANEL WITH GFR         Discussed  regular exercise, BP monitoring, weight control to achieve/maintain BMI less than 25 and  discussed med and SE's. Recommended labs to assess and monitor clinical status with further disposition pending results of labs. Over 30 minutes of exam, counseling, chart review was performed.

## 2017-05-10 NOTE — Telephone Encounter (Signed)
OK to hold Asa and plavix

## 2017-05-11 ENCOUNTER — Encounter: Payer: Self-pay | Admitting: Physician Assistant

## 2017-05-11 NOTE — Telephone Encounter (Signed)
    Chart reviewed as part of pre-operative protocol coverage.   Per previous note by Bernerd Pho PA-C on 05/04/17, "Given past medical history and time since last visit, based on ACC/AHA guidelines, Robert Schaefer would be at acceptable risk for the planned procedure without further cardiovascular testing."  The issue of aspirin and Plavix will be at discretion of the dentist performing the procedure as a simple extraction may not require holding of antiplatelets. If deemed necessary, per Dr. Kennon Holter reply, "OK to hold Asa and plavix". Per protocol would resume as soon as felt safe from bleeding standpoint.   Will route bundled note to requesting provider. Please call with any questions.  Charlie Pitter, PA-C  05/11/2017, 1:30 PM

## 2017-05-12 ENCOUNTER — Telehealth: Payer: Self-pay

## 2017-05-12 ENCOUNTER — Telehealth: Payer: Self-pay | Admitting: Radiation Oncology

## 2017-05-12 NOTE — Telephone Encounter (Signed)
      [facility] Pre-operative Risk Assessment    Request for surgical clearance:  1. What type of surgery is being performed? One tooth extraction   2. When is this surgery scheduled?  Unknown until clearance received   3. Are there any medications that need to be held prior to surgery and how long? None listed-but pt takes plavix   4. Practice name and name of physician performing surgery? Irene Shipper DDS   5. What is your office phone and fax number? [phone] fax [phone]   6. Anesthesia type (None, local, MAC, general) ? None listed   Robert Schaefer 05/12/2017, 4:37 PM  _________________________________________________________________   (provider comments below)

## 2017-05-12 NOTE — Progress Notes (Signed)
  Radiation Oncology         [phone] ________________________________  Name: Robert Schaefer MRN: [id number]  Date: 05/05/2017  DOB: 08-21-1940  SIMULATION AND TREATMENT PLANNING NOTE    ICD-10-CM   1. Malignant neoplasm of prostate ([place]) C61     DIAGNOSIS:  76 y.o. gentleman with Stage T1c adenocarcinoma of the prostate with Gleason Score of 4+5 and PSA of 8.15  NARRATIVE:  The patient was brought to the [facility].  Identity was confirmed.  All relevant records and images related to the planned course of therapy were reviewed.  The patient freely provided informed written consent to proceed with treatment after reviewing the details related to the planned course of therapy. The consent form was witnessed and verified by the simulation staff.  Then, the patient was set-up in a stable reproducible supine position for radiation therapy.  A vacuum lock pillow device was custom fabricated to position his legs in a reproducible immobilized position.  Then, I performed a urethrogram under sterile conditions to identify the prostatic apex.  CT images were obtained.  Surface markings were placed.  The CT images were loaded into the planning software.  Then the prostate target and avoidance structures including the rectum, bladder, bowel and hips were contoured.  Treatment planning then occurred.  The radiation prescription was entered and confirmed.  A total of one complex treatment devices were fabricated. I have requested : Intensity Modulated Radiotherapy (IMRT) is medically necessary for this case for the following reason:  Rectal sparing.[name]  PLAN:  The patient will receive 45 Gy in 25 fractions of 1.8 Gy, followed by a boost to the prostate to a total dose of 75 Gy with 15 additional fractions of 2.0 Gy.  ________________________________  Sheral Apley [name], M.D.

## 2017-05-12 NOTE — Telephone Encounter (Signed)
Patient needs tooth pulled. Robert Schaefer left message inquiring if pulling the patient's teeth was contraindicated with radiation. Explained we are radiating his prostate thus there are no contraindication. She verbalized understanding and expressed appreciation for the call.

## 2017-05-13 ENCOUNTER — Telehealth: Payer: Self-pay | Admitting: *Deleted

## 2017-05-13 ENCOUNTER — Other Ambulatory Visit (HOSPITAL_COMMUNITY): Payer: Medicare Other

## 2017-05-13 DIAGNOSIS — E785 Hyperlipidemia, unspecified: Secondary | ICD-10-CM | POA: Diagnosis not present

## 2017-05-13 DIAGNOSIS — Z510 Encounter for antineoplastic radiation therapy: Secondary | ICD-10-CM | POA: Diagnosis not present

## 2017-05-13 DIAGNOSIS — J449 Chronic obstructive pulmonary disease, unspecified: Secondary | ICD-10-CM | POA: Diagnosis not present

## 2017-05-13 DIAGNOSIS — I739 Peripheral vascular disease, unspecified: Secondary | ICD-10-CM | POA: Diagnosis not present

## 2017-05-13 DIAGNOSIS — Z95820 Peripheral vascular angioplasty status with implants and grafts: Secondary | ICD-10-CM | POA: Diagnosis not present

## 2017-05-13 DIAGNOSIS — C61 Malignant neoplasm of prostate: Secondary | ICD-10-CM | POA: Diagnosis not present

## 2017-05-13 NOTE — Telephone Encounter (Signed)
Returned call to patient's daughter Pam.Clearance note faxed to Genia Del office at fax # [phone].

## 2017-05-13 NOTE — Telephone Encounter (Signed)
Daughter wants to know the status of his clearance,Pt starts Radiation tomorrow. He needs his dental work asap please.

## 2017-05-13 NOTE — Telephone Encounter (Signed)
Returned patient's phone call, lvm 

## 2017-05-14 ENCOUNTER — Ambulatory Visit
Admission: RE | Admit: 2017-05-14 | Discharge: 2017-05-14 | Disposition: A | Discharge status: Home / Self Care | Payer: Medicare Other | Source: Ambulatory Visit | Attending: Radiation Oncology | Admitting: Radiation Oncology

## 2017-05-14 DIAGNOSIS — Z510 Encounter for antineoplastic radiation therapy: Secondary | ICD-10-CM | POA: Diagnosis not present

## 2017-05-14 DIAGNOSIS — Z95820 Peripheral vascular angioplasty status with implants and grafts: Secondary | ICD-10-CM | POA: Diagnosis not present

## 2017-05-14 DIAGNOSIS — E785 Hyperlipidemia, unspecified: Secondary | ICD-10-CM | POA: Diagnosis not present

## 2017-05-14 DIAGNOSIS — C61 Malignant neoplasm of prostate: Secondary | ICD-10-CM | POA: Diagnosis not present

## 2017-05-14 DIAGNOSIS — I739 Peripheral vascular disease, unspecified: Secondary | ICD-10-CM | POA: Diagnosis not present

## 2017-05-14 DIAGNOSIS — J449 Chronic obstructive pulmonary disease, unspecified: Secondary | ICD-10-CM | POA: Diagnosis not present

## 2017-05-15 ENCOUNTER — Ambulatory Visit
Admission: RE | Admit: 2017-05-15 | Discharge: 2017-05-15 | Disposition: A | Discharge status: Home / Self Care | Payer: Medicare Other | Source: Ambulatory Visit | Attending: Radiation Oncology | Admitting: Radiation Oncology

## 2017-05-15 DIAGNOSIS — E785 Hyperlipidemia, unspecified: Secondary | ICD-10-CM | POA: Diagnosis not present

## 2017-05-15 DIAGNOSIS — Z95820 Peripheral vascular angioplasty status with implants and grafts: Secondary | ICD-10-CM | POA: Diagnosis not present

## 2017-05-15 DIAGNOSIS — J449 Chronic obstructive pulmonary disease, unspecified: Secondary | ICD-10-CM | POA: Diagnosis not present

## 2017-05-15 DIAGNOSIS — Z510 Encounter for antineoplastic radiation therapy: Secondary | ICD-10-CM | POA: Diagnosis not present

## 2017-05-15 DIAGNOSIS — C61 Malignant neoplasm of prostate: Secondary | ICD-10-CM | POA: Diagnosis not present

## 2017-05-15 DIAGNOSIS — I739 Peripheral vascular disease, unspecified: Secondary | ICD-10-CM | POA: Diagnosis not present

## 2017-05-17 ENCOUNTER — Ambulatory Visit
Admission: RE | Admit: 2017-05-17 | Discharge: 2017-05-17 | Disposition: A | Discharge status: Home / Self Care | Payer: Medicare Other | Source: Ambulatory Visit | Attending: Radiation Oncology | Admitting: Radiation Oncology

## 2017-05-17 DIAGNOSIS — J449 Chronic obstructive pulmonary disease, unspecified: Secondary | ICD-10-CM | POA: Diagnosis not present

## 2017-05-17 DIAGNOSIS — C61 Malignant neoplasm of prostate: Secondary | ICD-10-CM | POA: Diagnosis not present

## 2017-05-17 DIAGNOSIS — E785 Hyperlipidemia, unspecified: Secondary | ICD-10-CM | POA: Diagnosis not present

## 2017-05-17 DIAGNOSIS — Z510 Encounter for antineoplastic radiation therapy: Secondary | ICD-10-CM | POA: Diagnosis not present

## 2017-05-17 DIAGNOSIS — Z95820 Peripheral vascular angioplasty status with implants and grafts: Secondary | ICD-10-CM | POA: Diagnosis not present

## 2017-05-17 DIAGNOSIS — I739 Peripheral vascular disease, unspecified: Secondary | ICD-10-CM | POA: Diagnosis not present

## 2017-05-18 ENCOUNTER — Ambulatory Visit
Admission: RE | Admit: 2017-05-18 | Discharge: 2017-05-18 | Disposition: A | Discharge status: Home / Self Care | Payer: Medicare Other | Source: Ambulatory Visit | Attending: Radiation Oncology | Admitting: Radiation Oncology

## 2017-05-18 DIAGNOSIS — Z510 Encounter for antineoplastic radiation therapy: Secondary | ICD-10-CM | POA: Diagnosis not present

## 2017-05-18 DIAGNOSIS — C61 Malignant neoplasm of prostate: Secondary | ICD-10-CM | POA: Diagnosis not present

## 2017-05-18 DIAGNOSIS — I739 Peripheral vascular disease, unspecified: Secondary | ICD-10-CM | POA: Diagnosis not present

## 2017-05-18 DIAGNOSIS — E785 Hyperlipidemia, unspecified: Secondary | ICD-10-CM | POA: Diagnosis not present

## 2017-05-18 DIAGNOSIS — Z95820 Peripheral vascular angioplasty status with implants and grafts: Secondary | ICD-10-CM | POA: Diagnosis not present

## 2017-05-18 DIAGNOSIS — J449 Chronic obstructive pulmonary disease, unspecified: Secondary | ICD-10-CM | POA: Diagnosis not present

## 2017-05-19 ENCOUNTER — Ambulatory Visit
Admission: RE | Admit: 2017-05-19 | Discharge: 2017-05-19 | Disposition: A | Discharge status: Home / Self Care | Payer: Medicare Other | Source: Ambulatory Visit | Attending: Radiation Oncology | Admitting: Radiation Oncology

## 2017-05-19 DIAGNOSIS — I739 Peripheral vascular disease, unspecified: Secondary | ICD-10-CM | POA: Diagnosis not present

## 2017-05-19 DIAGNOSIS — E785 Hyperlipidemia, unspecified: Secondary | ICD-10-CM | POA: Diagnosis not present

## 2017-05-19 DIAGNOSIS — Z510 Encounter for antineoplastic radiation therapy: Secondary | ICD-10-CM | POA: Diagnosis not present

## 2017-05-19 DIAGNOSIS — Z95820 Peripheral vascular angioplasty status with implants and grafts: Secondary | ICD-10-CM | POA: Diagnosis not present

## 2017-05-19 DIAGNOSIS — J449 Chronic obstructive pulmonary disease, unspecified: Secondary | ICD-10-CM | POA: Diagnosis not present

## 2017-05-19 DIAGNOSIS — C61 Malignant neoplasm of prostate: Secondary | ICD-10-CM | POA: Diagnosis not present

## 2017-05-20 ENCOUNTER — Other Ambulatory Visit: Payer: Self-pay | Admitting: Radiation Oncology

## 2017-05-20 ENCOUNTER — Ambulatory Visit
Admission: RE | Admit: 2017-05-20 | Discharge: 2017-05-20 | Disposition: A | Discharge status: Home / Self Care | Payer: Medicare Other | Source: Ambulatory Visit | Attending: Radiation Oncology | Admitting: Radiation Oncology

## 2017-05-20 DIAGNOSIS — Z510 Encounter for antineoplastic radiation therapy: Secondary | ICD-10-CM | POA: Diagnosis not present

## 2017-05-20 DIAGNOSIS — I739 Peripheral vascular disease, unspecified: Secondary | ICD-10-CM | POA: Diagnosis not present

## 2017-05-20 DIAGNOSIS — J449 Chronic obstructive pulmonary disease, unspecified: Secondary | ICD-10-CM | POA: Diagnosis not present

## 2017-05-20 DIAGNOSIS — Z95820 Peripheral vascular angioplasty status with implants and grafts: Secondary | ICD-10-CM | POA: Diagnosis not present

## 2017-05-20 DIAGNOSIS — E785 Hyperlipidemia, unspecified: Secondary | ICD-10-CM | POA: Diagnosis not present

## 2017-05-20 DIAGNOSIS — C61 Malignant neoplasm of prostate: Secondary | ICD-10-CM | POA: Diagnosis not present

## 2017-05-20 MED ORDER — ALFUZOSIN HCL ER 10 MG PO TB24: 10.0000 mg | ORAL_TABLET | Freq: Every day | ORAL | 0 refills | Status: DC

## 2017-05-25 ENCOUNTER — Ambulatory Visit: Payer: Medicare Other | Admitting: Physician Assistant

## 2017-05-25 ENCOUNTER — Ambulatory Visit
Admission: RE | Admit: 2017-05-25 | Discharge: 2017-05-25 | Disposition: A | Discharge status: Home / Self Care | Payer: Medicare Other | Source: Ambulatory Visit | Attending: Radiation Oncology | Admitting: Radiation Oncology

## 2017-05-25 DIAGNOSIS — I739 Peripheral vascular disease, unspecified: Secondary | ICD-10-CM | POA: Diagnosis not present

## 2017-05-25 DIAGNOSIS — C61 Malignant neoplasm of prostate: Secondary | ICD-10-CM | POA: Diagnosis not present

## 2017-05-25 DIAGNOSIS — Z510 Encounter for antineoplastic radiation therapy: Secondary | ICD-10-CM | POA: Diagnosis not present

## 2017-05-25 DIAGNOSIS — E785 Hyperlipidemia, unspecified: Secondary | ICD-10-CM | POA: Diagnosis not present

## 2017-05-25 DIAGNOSIS — Z95820 Peripheral vascular angioplasty status with implants and grafts: Secondary | ICD-10-CM | POA: Diagnosis not present

## 2017-05-25 DIAGNOSIS — J449 Chronic obstructive pulmonary disease, unspecified: Secondary | ICD-10-CM | POA: Diagnosis not present

## 2017-05-26 ENCOUNTER — Ambulatory Visit
Admission: RE | Admit: 2017-05-26 | Discharge: 2017-05-26 | Disposition: A | Discharge status: Home / Self Care | Payer: Medicare Other | Source: Ambulatory Visit | Attending: Radiation Oncology | Admitting: Radiation Oncology

## 2017-05-26 DIAGNOSIS — J449 Chronic obstructive pulmonary disease, unspecified: Secondary | ICD-10-CM | POA: Diagnosis not present

## 2017-05-26 DIAGNOSIS — E785 Hyperlipidemia, unspecified: Secondary | ICD-10-CM | POA: Diagnosis not present

## 2017-05-26 DIAGNOSIS — C61 Malignant neoplasm of prostate: Secondary | ICD-10-CM | POA: Diagnosis not present

## 2017-05-26 DIAGNOSIS — Z95820 Peripheral vascular angioplasty status with implants and grafts: Secondary | ICD-10-CM | POA: Diagnosis not present

## 2017-05-26 DIAGNOSIS — Z510 Encounter for antineoplastic radiation therapy: Secondary | ICD-10-CM | POA: Diagnosis not present

## 2017-05-26 DIAGNOSIS — I739 Peripheral vascular disease, unspecified: Secondary | ICD-10-CM | POA: Diagnosis not present

## 2017-05-27 ENCOUNTER — Ambulatory Visit
Admission: RE | Admit: 2017-05-27 | Discharge: 2017-05-27 | Disposition: A | Discharge status: Home / Self Care | Payer: Medicare Other | Source: Ambulatory Visit | Attending: Radiation Oncology | Admitting: Radiation Oncology

## 2017-05-27 DIAGNOSIS — Z95820 Peripheral vascular angioplasty status with implants and grafts: Secondary | ICD-10-CM | POA: Diagnosis not present

## 2017-05-27 DIAGNOSIS — C61 Malignant neoplasm of prostate: Secondary | ICD-10-CM | POA: Diagnosis not present

## 2017-05-27 DIAGNOSIS — I739 Peripheral vascular disease, unspecified: Secondary | ICD-10-CM | POA: Diagnosis not present

## 2017-05-27 DIAGNOSIS — Z510 Encounter for antineoplastic radiation therapy: Secondary | ICD-10-CM | POA: Diagnosis not present

## 2017-05-27 DIAGNOSIS — E785 Hyperlipidemia, unspecified: Secondary | ICD-10-CM | POA: Diagnosis not present

## 2017-05-27 DIAGNOSIS — J449 Chronic obstructive pulmonary disease, unspecified: Secondary | ICD-10-CM | POA: Diagnosis not present

## 2017-05-28 ENCOUNTER — Ambulatory Visit
Admission: RE | Admit: 2017-05-28 | Discharge: 2017-05-28 | Disposition: A | Discharge status: Home / Self Care | Payer: Medicare Other | Source: Ambulatory Visit | Attending: Radiation Oncology | Admitting: Radiation Oncology

## 2017-05-28 DIAGNOSIS — Z95820 Peripheral vascular angioplasty status with implants and grafts: Secondary | ICD-10-CM | POA: Diagnosis not present

## 2017-05-28 DIAGNOSIS — I739 Peripheral vascular disease, unspecified: Secondary | ICD-10-CM | POA: Diagnosis not present

## 2017-05-28 DIAGNOSIS — Z510 Encounter for antineoplastic radiation therapy: Secondary | ICD-10-CM | POA: Diagnosis not present

## 2017-05-28 DIAGNOSIS — E785 Hyperlipidemia, unspecified: Secondary | ICD-10-CM | POA: Diagnosis not present

## 2017-05-28 DIAGNOSIS — J449 Chronic obstructive pulmonary disease, unspecified: Secondary | ICD-10-CM | POA: Diagnosis not present

## 2017-05-28 DIAGNOSIS — C61 Malignant neoplasm of prostate: Secondary | ICD-10-CM | POA: Diagnosis not present

## 2017-05-29 ENCOUNTER — Ambulatory Visit (HOSPITAL_COMMUNITY)
Admission: RE | Admit: 2017-05-29 | Discharge: 2017-05-29 | Disposition: A | Discharge status: Home / Self Care | Payer: Medicare Other | Source: Ambulatory Visit | Attending: Cardiology | Admitting: Cardiology

## 2017-05-29 ENCOUNTER — Ambulatory Visit (HOSPITAL_BASED_OUTPATIENT_CLINIC_OR_DEPARTMENT_OTHER)
Admission: RE | Admit: 2017-05-29 | Discharge: 2017-05-29 | Disposition: A | Discharge status: Home / Self Care | Payer: Medicare Other | Source: Ambulatory Visit | Attending: Cardiovascular Disease | Admitting: Cardiovascular Disease

## 2017-05-29 ENCOUNTER — Ambulatory Visit
Admission: RE | Admit: 2017-05-29 | Discharge: 2017-05-29 | Disposition: A | Discharge status: Home / Self Care | Payer: Medicare Other | Source: Ambulatory Visit | Attending: Radiation Oncology | Admitting: Radiation Oncology

## 2017-05-29 DIAGNOSIS — I350 Nonrheumatic aortic (valve) stenosis: Secondary | ICD-10-CM | POA: Insufficient documentation

## 2017-05-29 DIAGNOSIS — I739 Peripheral vascular disease, unspecified: Secondary | ICD-10-CM | POA: Diagnosis not present

## 2017-05-29 DIAGNOSIS — R9389 Abnormal findings on diagnostic imaging of other specified body structures: Secondary | ICD-10-CM | POA: Insufficient documentation

## 2017-05-29 DIAGNOSIS — Z510 Encounter for antineoplastic radiation therapy: Secondary | ICD-10-CM | POA: Diagnosis not present

## 2017-05-29 DIAGNOSIS — Z95820 Peripheral vascular angioplasty status with implants and grafts: Secondary | ICD-10-CM | POA: Diagnosis not present

## 2017-05-29 DIAGNOSIS — C61 Malignant neoplasm of prostate: Secondary | ICD-10-CM | POA: Diagnosis not present

## 2017-05-29 DIAGNOSIS — E785 Hyperlipidemia, unspecified: Secondary | ICD-10-CM | POA: Diagnosis not present

## 2017-05-29 DIAGNOSIS — J449 Chronic obstructive pulmonary disease, unspecified: Secondary | ICD-10-CM | POA: Diagnosis not present

## 2017-06-01 ENCOUNTER — Ambulatory Visit
Admission: RE | Admit: 2017-06-01 | Discharge: 2017-06-01 | Disposition: A | Discharge status: Home / Self Care | Payer: Medicare Other | Source: Ambulatory Visit | Attending: Radiation Oncology | Admitting: Radiation Oncology

## 2017-06-01 ENCOUNTER — Ambulatory Visit: Payer: Self-pay | Admitting: Internal Medicine

## 2017-06-01 DIAGNOSIS — J449 Chronic obstructive pulmonary disease, unspecified: Secondary | ICD-10-CM | POA: Diagnosis not present

## 2017-06-01 DIAGNOSIS — Z95820 Peripheral vascular angioplasty status with implants and grafts: Secondary | ICD-10-CM | POA: Diagnosis not present

## 2017-06-01 DIAGNOSIS — I739 Peripheral vascular disease, unspecified: Secondary | ICD-10-CM | POA: Diagnosis not present

## 2017-06-01 DIAGNOSIS — Z510 Encounter for antineoplastic radiation therapy: Secondary | ICD-10-CM | POA: Diagnosis not present

## 2017-06-01 DIAGNOSIS — C61 Malignant neoplasm of prostate: Secondary | ICD-10-CM | POA: Diagnosis not present

## 2017-06-01 DIAGNOSIS — E785 Hyperlipidemia, unspecified: Secondary | ICD-10-CM | POA: Diagnosis not present

## 2017-06-02 ENCOUNTER — Ambulatory Visit
Admission: RE | Admit: 2017-06-02 | Discharge: 2017-06-02 | Disposition: A | Discharge status: Home / Self Care | Payer: Medicare Other | Source: Ambulatory Visit | Attending: Radiation Oncology | Admitting: Radiation Oncology

## 2017-06-02 ENCOUNTER — Encounter: Payer: Self-pay | Admitting: Medical Oncology

## 2017-06-02 ENCOUNTER — Ambulatory Visit: Payer: Self-pay | Admitting: Internal Medicine

## 2017-06-02 DIAGNOSIS — C61 Malignant neoplasm of prostate: Secondary | ICD-10-CM | POA: Diagnosis not present

## 2017-06-02 DIAGNOSIS — Z510 Encounter for antineoplastic radiation therapy: Secondary | ICD-10-CM | POA: Insufficient documentation

## 2017-06-02 NOTE — Progress Notes (Signed)
Robert Schaefer states he is doing well with radiation. He received Lupron in September and started radiation 05/14/17. He is tolerating well with no complaints. I will continue to follow.

## 2017-06-03 ENCOUNTER — Ambulatory Visit
Admission: RE | Admit: 2017-06-03 | Discharge: 2017-06-03 | Disposition: A | Discharge status: Home / Self Care | Payer: Medicare Other | Source: Ambulatory Visit | Attending: Radiation Oncology | Admitting: Radiation Oncology

## 2017-06-03 DIAGNOSIS — Z510 Encounter for antineoplastic radiation therapy: Secondary | ICD-10-CM | POA: Diagnosis not present

## 2017-06-03 DIAGNOSIS — C61 Malignant neoplasm of prostate: Secondary | ICD-10-CM | POA: Diagnosis not present

## 2017-06-04 ENCOUNTER — Ambulatory Visit
Admission: RE | Admit: 2017-06-04 | Discharge: 2017-06-04 | Disposition: A | Discharge status: Home / Self Care | Payer: Medicare Other | Source: Ambulatory Visit | Attending: Radiation Oncology | Admitting: Radiation Oncology

## 2017-06-04 ENCOUNTER — Other Ambulatory Visit: Payer: Self-pay | Admitting: Cardiovascular Disease

## 2017-06-04 DIAGNOSIS — C61 Malignant neoplasm of prostate: Secondary | ICD-10-CM | POA: Diagnosis not present

## 2017-06-04 DIAGNOSIS — I739 Peripheral vascular disease, unspecified: Secondary | ICD-10-CM

## 2017-06-04 DIAGNOSIS — Z510 Encounter for antineoplastic radiation therapy: Secondary | ICD-10-CM | POA: Diagnosis not present

## 2017-06-05 ENCOUNTER — Ambulatory Visit
Admission: RE | Admit: 2017-06-05 | Discharge: 2017-06-05 | Disposition: A | Discharge status: Home / Self Care | Payer: Medicare Other | Source: Ambulatory Visit | Attending: Radiation Oncology | Admitting: Radiation Oncology

## 2017-06-05 DIAGNOSIS — Z510 Encounter for antineoplastic radiation therapy: Secondary | ICD-10-CM | POA: Diagnosis not present

## 2017-06-05 DIAGNOSIS — C61 Malignant neoplasm of prostate: Secondary | ICD-10-CM | POA: Diagnosis not present

## 2017-06-08 ENCOUNTER — Ambulatory Visit: Payer: Medicare Other

## 2017-06-09 ENCOUNTER — Ambulatory Visit
Admission: RE | Admit: 2017-06-09 | Discharge: 2017-06-09 | Disposition: A | Discharge status: Home / Self Care | Payer: Medicare Other | Source: Ambulatory Visit | Attending: Radiation Oncology | Admitting: Radiation Oncology

## 2017-06-09 DIAGNOSIS — Z510 Encounter for antineoplastic radiation therapy: Secondary | ICD-10-CM | POA: Diagnosis not present

## 2017-06-09 DIAGNOSIS — C61 Malignant neoplasm of prostate: Secondary | ICD-10-CM | POA: Diagnosis not present

## 2017-06-10 ENCOUNTER — Ambulatory Visit
Admission: RE | Admit: 2017-06-10 | Discharge: 2017-06-10 | Disposition: A | Discharge status: Home / Self Care | Payer: Medicare Other | Source: Ambulatory Visit | Attending: Radiation Oncology | Admitting: Radiation Oncology

## 2017-06-10 DIAGNOSIS — C61 Malignant neoplasm of prostate: Secondary | ICD-10-CM | POA: Diagnosis not present

## 2017-06-10 DIAGNOSIS — Z510 Encounter for antineoplastic radiation therapy: Secondary | ICD-10-CM | POA: Diagnosis not present

## 2017-06-11 ENCOUNTER — Ambulatory Visit: Payer: Self-pay | Admitting: Internal Medicine

## 2017-06-11 ENCOUNTER — Ambulatory Visit
Admission: RE | Admit: 2017-06-11 | Discharge: 2017-06-11 | Disposition: A | Discharge status: Home / Self Care | Payer: Medicare Other | Source: Ambulatory Visit | Attending: Radiation Oncology | Admitting: Radiation Oncology

## 2017-06-11 DIAGNOSIS — Z510 Encounter for antineoplastic radiation therapy: Secondary | ICD-10-CM | POA: Diagnosis not present

## 2017-06-11 DIAGNOSIS — C61 Malignant neoplasm of prostate: Secondary | ICD-10-CM | POA: Diagnosis not present

## 2017-06-12 ENCOUNTER — Ambulatory Visit
Admission: RE | Admit: 2017-06-12 | Discharge: 2017-06-12 | Disposition: A | Discharge status: Home / Self Care | Payer: Medicare Other | Source: Ambulatory Visit | Attending: Radiation Oncology | Admitting: Radiation Oncology

## 2017-06-12 DIAGNOSIS — Z510 Encounter for antineoplastic radiation therapy: Secondary | ICD-10-CM | POA: Diagnosis not present

## 2017-06-12 DIAGNOSIS — C61 Malignant neoplasm of prostate: Secondary | ICD-10-CM | POA: Diagnosis not present

## 2017-06-14 ENCOUNTER — Ambulatory Visit: Payer: Medicare Other

## 2017-06-15 ENCOUNTER — Ambulatory Visit
Admission: RE | Admit: 2017-06-15 | Discharge: 2017-06-15 | Disposition: A | Discharge status: Home / Self Care | Payer: Medicare Other | Source: Ambulatory Visit | Attending: Radiation Oncology | Admitting: Radiation Oncology

## 2017-06-15 DIAGNOSIS — C61 Malignant neoplasm of prostate: Secondary | ICD-10-CM | POA: Diagnosis not present

## 2017-06-15 DIAGNOSIS — Z510 Encounter for antineoplastic radiation therapy: Secondary | ICD-10-CM | POA: Diagnosis not present

## 2017-06-16 ENCOUNTER — Encounter: Payer: Self-pay | Admitting: Physician Assistant

## 2017-06-16 ENCOUNTER — Ambulatory Visit
Admission: RE | Admit: 2017-06-16 | Discharge: 2017-06-16 | Disposition: A | Discharge status: Home / Self Care | Payer: Medicare Other | Source: Ambulatory Visit | Attending: Radiation Oncology | Admitting: Radiation Oncology

## 2017-06-16 ENCOUNTER — Ambulatory Visit (INDEPENDENT_AMBULATORY_CARE_PROVIDER_SITE_OTHER): Payer: Medicare Other | Admitting: Physician Assistant

## 2017-06-16 ENCOUNTER — Telehealth: Payer: Self-pay | Admitting: *Deleted

## 2017-06-16 VITALS — BP 120/58 | HR 68 | Ht 64.0 in | Wt 142.0 lb

## 2017-06-16 DIAGNOSIS — R1319 Other dysphagia: Secondary | ICD-10-CM

## 2017-06-16 DIAGNOSIS — Z7982 Long term (current) use of aspirin: Secondary | ICD-10-CM | POA: Diagnosis not present

## 2017-06-16 DIAGNOSIS — K219 Gastro-esophageal reflux disease without esophagitis: Secondary | ICD-10-CM

## 2017-06-16 DIAGNOSIS — C61 Malignant neoplasm of prostate: Secondary | ICD-10-CM | POA: Diagnosis not present

## 2017-06-16 DIAGNOSIS — Z8719 Personal history of other diseases of the digestive system: Secondary | ICD-10-CM

## 2017-06-16 DIAGNOSIS — Z510 Encounter for antineoplastic radiation therapy: Secondary | ICD-10-CM | POA: Diagnosis not present

## 2017-06-16 MED ORDER — OMEPRAZOLE 40 MG PO CPDR: DELAYED_RELEASE_CAPSULE | ORAL | 3 refills | Status: DC

## 2017-06-16 NOTE — Patient Instructions (Addendum)
You have been scheduled for an endoscopy. Please follow written instructions given to you at your visit today. If you use inhalers (even only as needed), please bring them with you on the day of your procedure. Your physician has requested that you go to www.startemmi.com and enter the access code given to you at your visit today. This web site gives a general overview about your procedure. However, you should still follow specific instructions given to you by our office regarding your preparation for the procedure.  We will call you when we get the Plavix instructions from Dr. [name].  If you are age 70 or older, your body mass index should be between 23-30. Your Body mass index is 24.37 kg/m. If this is out of the aforementioned range listed, please consider follow up with your Primary Care Provider.  We sent a prescription for Omeprazole 40 mg to your pharmacy, [facility].

## 2017-06-16 NOTE — Telephone Encounter (Signed)
  06/16/2017   RE: Robert Schaefer DOB: 21-Jun-1941 MRN: [id number]   Dear Dr. Quay Burow,    We have scheduled the above patient for an endoscopic procedure. Our records show that he is on anticoagulation therapy.   Please advise as to how long the patient may come off his therapy of Plavix prior to the procedure, which is scheduled for 07-10-2017.  Please route the Plavix clearance instructions to [place].    Sincerely,    Amy Esterwood PA-C

## 2017-06-16 NOTE — Telephone Encounter (Signed)
Okay to intravenous antiplatelet therapy for GI procedure.

## 2017-06-16 NOTE — Progress Notes (Signed)
Subjective:    Patient ID: Robert Schaefer, male    DOB: October 27, 1940, 76 y.o.   MRN: [id number]  HPI Robert Schaefer is a very nice 76 year old white male, new to GI today referred by Robert Schaefer for evaluation of dysphasia.  Patient had previously been known to Robert Schaefer and has had endoscopies and Savary dilations of a distal esophageal Schatzki's ring.  His last EGD was done in May 2017, at which time he was Savary dilated to 16 mm.  Prior to that EGD in 2015 with Savary dilation to 18 mm. Patient had colonoscopy done for screening in 2011 per Robert Schaefer which was normal. The patient says the last dilation did not alleviate his symptoms and he has been having problems since.  He did not want to return to Robert Schaefer because apparently some comment was made about him having food in his esophagus at the time of the procedure indicating he may not have been compliant with instructions which she says he was. Patient says he has had some gradual worsening of his symptoms and is now having dysphagia on a daily basis with frequent episodes of regurgitation almost daily.  He has some difficulty with liquids but more problems with solids and particularly meats which she has been avoiding.  He has gradually lost from 182 pounds to 140 pounds over the past year and a half or so but also has been diagnosed with prostate cancer, he had seed implants placed in October and is now undergoing radiation through early January. Patient states that he does have fairly regular heartburn and indigestion, but has not been on any acid blocker over the past year or so.  Apparently he just stopped taking it. Other medical problems include aortic stenosis, abdominal aortic aneurysm, prostate cancer as above, peripheral arterial disease for which he is status post bilateral iliac stents.   He has been maintained on Plavix and aspirin and followed by Robert Schaefer.  He has COPD without oxygen use and an EF of 60%.  Review of Systems  Pertinent positive and negative review of systems were noted in the above HPI section.  All other review of systems was otherwise negative.  Outpatient Encounter Medications as of 06/16/2017  Medication Sig  . albuterol (PROVENTIL HFA;VENTOLIN HFA) 108 (90 Base) MCG/ACT inhaler Inhale 2 puffs into the lungs every 6 (six) hours as needed for wheezing or shortness of breath.  . alfuzosin (UROXATRAL) 10 MG 24 hr tablet Take 1 tablet (10 mg total) by mouth daily with breakfast.  . aspirin EC 81 MG tablet Take 81 mg by mouth daily.  . Cholecalciferol (VITAMIN D3) 1000 units CAPS Take 2 capsules by mouth daily. Takes along with 5000 units to make a total of 7000 units per day  . clopidogrel (PLAVIX) 75 MG tablet Take 75 mg daily by mouth.  . fluticasone (FLONASE) 50 MCG/ACT nasal spray Place 2 sprays into both nostrils daily. (Patient taking differently: Place 2 sprays into both nostrils daily as needed for allergies. )  . levothyroxine (SYNTHROID, LEVOTHROID) 50 MCG tablet Take 1 tablet (50 mcg total) by mouth daily.  [name] omeprazole (PRILOSEC) 40 MG capsule Take 1 tablet with dinner daily.   No facility-administered encounter medications on file as of 06/16/2017.    Allergies  Allergen Reactions  . Penicillins Anaphylaxis, Itching and Swelling  . Sulfa Antibiotics Anaphylaxis and Swelling  . Versed [Midazolam] Other (See Comments)    Confusion, delerium   Patient Active Problem List  Diagnosis Date Noted  . Near syncope 04/27/2017  . Malignant neoplasm of prostate ([place]) 03/03/2017  . Mesenteric ischemia ([place]) 09/16/2016  . Atherosclerosis of aorta ([place]) 09/15/2016  . Hiatal hernia 09/03/2016  . Cavitating mass in left upper lung lobe 08/12/2016  . PVD (peripheral vascular disease) ([place]) 05/31/2016  . Aortic stenosis, moderate 05/27/2016  . Claudication ([place]) 04/16/2016  . BMI 29.0-29.9,adult 06/01/2015  . Hypothyroidism 11/07/2014  . COPD 11/07/2014  . BPH w/LUTS 11/07/2014  .  Medication management 04/11/2014  . Vitamin D deficiency 07/05/2013  . Hyperlipidemia   . Prediabetes   . COPD with asthma ([place])   . IBS (irritable bowel syndrome)   . RLS (restless legs syndrome)   . OSA and COPD overlap syndrome ([place])   . Essential hypertension 09/28/2009  . GERD 09/28/2009   Social History   Socioeconomic History  . Marital status: Married    Spouse name: Not on file  . Number of children: Not on file  . Years of education: Not on file  . Highest education level: Not on file  Social Needs  . Financial resource strain: Not on file  . Food insecurity - worry: Not on file  . Food insecurity - inability: Not on file  . Transportation needs - medical: Not on file  . Transportation needs - non-medical: Not on file  Occupational History  . Not on file  Tobacco Use  . Smoking status: Former Smoker    Packs/day: 1.00    Years: 50.00    Pack years: 50.00    Types: Cigarettes    Last attempt to quit: 07/05/2005    Years since quitting: 11.9  . Smokeless tobacco: Never Used  Substance and Sexual Activity  . Alcohol use: No  . Drug use: No  . Sexual activity: No  Other Topics Concern  . Not on file  Social History Narrative  . Not on file    Robert Schaefer family history includes Asthma in his father; Heart attack in his mother; Stroke in his father.      Objective:    Vitals:   06/16/17 1053  BP: (!) 120/58  Pulse: 68    Physical Exam ; well-developed elderly white male in no acute distress, accompanied by his wife and daughter, very pleasant blood pressure 120/58, pulse 68, height 5 foot 4, weight 142, BMI 24.3.  HEENT; nontraumatic normocephalic EOMI PERRLA sclera are anicteric, he is missing a few of his front teeth,, Cardiovascular ;regular rate and rhythm with S1-S2 he has a squeaky systolic murmur, Pulmonary; clear bilaterally, Abdomen; soft, nontender nondistended bowel sounds are active there is no palpable mass or hepatosplenomegaly, Extremities;  no clubbing cyanosis or edema skin warm and dry, Neuro psych; mood and affect appropriate he is somewhat hard of hearing       Assessment & Plan:   #85 76 year old white male with previously documented distal esophageal Schatzki's ring with previous Savary dilations last May 2017 per Robert Schaefer who presents with gradually worsening dysphagia, primarily to solids but also intermittently with liquids, and frequent episodes of dysphagia requiring regurgitation Patient did not have any improvement in his symptoms after last EGD with dilation to 16 mm. #2 chronic antiplatelet therapy-on Plavix and aspirin #3.  Peripheral arterial disease status post bilateral iliac stents #4.  Aortic stenosis #5.  Prostate cancer, status post seed implants October 2018 and currently undergoing radiation therapy #6.  COPD no oxygen use #7 colon cancer surveillance-up-to-date with normal colonoscopy 2011  Plan; start omeprazole 40 mg p.o. before meals dinner as he generally has GERD symptoms nocturnally. Patient will be scheduled for EGD with dilation with Dr. Havery Moros.  Procedure was discussed in detail with patient including indications risks and benefits and he is agreeable to proceed. He will need to stop Plavix for 5-7 days prior to procedure.  We will communicate with his cardiologist Robert Schaefer to assure this is reasonable for this patient.  He will continue baby aspirin 1 daily. We discussed possible need for serial dilations depending on how tight his stricture is.  He did have a barium swallow done a few years ago that also showed tertiary contractions in his esophagus.    S  PA-C 06/16/2017   Cc: [name], MD

## 2017-06-17 ENCOUNTER — Ambulatory Visit
Admission: RE | Admit: 2017-06-17 | Discharge: 2017-06-17 | Disposition: A | Discharge status: Home / Self Care | Payer: Medicare Other | Source: Ambulatory Visit | Attending: Radiation Oncology | Admitting: Radiation Oncology

## 2017-06-17 ENCOUNTER — Other Ambulatory Visit: Payer: Self-pay | Admitting: *Deleted

## 2017-06-17 ENCOUNTER — Telehealth: Payer: Self-pay | Admitting: *Deleted

## 2017-06-17 DIAGNOSIS — Z510 Encounter for antineoplastic radiation therapy: Secondary | ICD-10-CM | POA: Diagnosis not present

## 2017-06-17 DIAGNOSIS — C61 Malignant neoplasm of prostate: Secondary | ICD-10-CM | POA: Diagnosis not present

## 2017-06-17 NOTE — Progress Notes (Signed)
Agree with assessment and plan as outlined.  

## 2017-06-17 NOTE — Telephone Encounter (Signed)
   Primary Cardiologist: Dr. [name] Chart reviewed as part of pre-operative protocol coverage. Given past medical history and time since last visit, based on ACC/AHA guidelines, Robert Schaefer would be at acceptable risk for the planned procedure without further cardiovascular testing.   I will route this recommendation to the requesting party via Epic fax function and remove from pre-op pool.  Please call with questions.  Ok to hold plavix for 5-7 days prior to endoscopic procedure and restart after the procedure as soon as possible at the discretion of surgeon based on bleeding risk  Almyra Deforest, [place] 06/17/2017, 3:15 PM

## 2017-06-17 NOTE — Telephone Encounter (Signed)
  06/16/2017   RE: Robert Schaefer DOB: 1941-05-11 MRN: [id number]   Dear Dr. Quay Burow,    We have scheduled the above patient for an endoscopic procedure. Our records show that he is on anticoagulation therapy.   Please advise as to how long the patient may come off his therapy of Plavix prior to the procedure, which is scheduled for 07-10-2017.  Please route the Plavix clearance instructions to [place].    Sincerely,    Amy Esterwood PA-C

## 2017-06-17 NOTE — Telephone Encounter (Signed)
Plavix and all antiplatelet therapy clearance by MD

## 2017-06-17 NOTE — Telephone Encounter (Signed)
Routed to Marisue Humble, CMA via Epic, per request.

## 2017-06-18 ENCOUNTER — Ambulatory Visit
Admission: RE | Admit: 2017-06-18 | Discharge: 2017-06-18 | Disposition: A | Discharge status: Home / Self Care | Payer: Medicare Other | Source: Ambulatory Visit | Attending: Radiation Oncology | Admitting: Radiation Oncology

## 2017-06-18 DIAGNOSIS — Z510 Encounter for antineoplastic radiation therapy: Secondary | ICD-10-CM | POA: Diagnosis not present

## 2017-06-18 DIAGNOSIS — C61 Malignant neoplasm of prostate: Secondary | ICD-10-CM | POA: Diagnosis not present

## 2017-06-18 NOTE — Telephone Encounter (Signed)
Okay to interrupt the antibiotic therapy for his GI procedure.

## 2017-06-19 ENCOUNTER — Ambulatory Visit: Payer: Medicare Other

## 2017-06-19 ENCOUNTER — Ambulatory Visit
Admission: RE | Admit: 2017-06-19 | Discharge: 2017-06-19 | Disposition: A | Discharge status: Home / Self Care | Payer: Medicare Other | Source: Ambulatory Visit | Attending: Radiation Oncology | Admitting: Radiation Oncology

## 2017-06-19 DIAGNOSIS — Z510 Encounter for antineoplastic radiation therapy: Secondary | ICD-10-CM | POA: Diagnosis not present

## 2017-06-19 DIAGNOSIS — C61 Malignant neoplasm of prostate: Secondary | ICD-10-CM | POA: Diagnosis not present

## 2017-06-19 NOTE — Telephone Encounter (Signed)
LM for the patient on his home answering machine. I advised on my message he can hold the Plavix on 1-6 through and including 07-10-2017. HE cvan then resume the Plavix. I LM for him to call me if he has any questions.

## 2017-06-22 ENCOUNTER — Ambulatory Visit
Admission: RE | Admit: 2017-06-22 | Discharge: 2017-06-22 | Disposition: A | Discharge status: Home / Self Care | Payer: Medicare Other | Source: Ambulatory Visit | Attending: Radiation Oncology | Admitting: Radiation Oncology

## 2017-06-22 ENCOUNTER — Ambulatory Visit: Payer: Medicare Other

## 2017-06-22 DIAGNOSIS — C61 Malignant neoplasm of prostate: Secondary | ICD-10-CM | POA: Diagnosis not present

## 2017-06-22 DIAGNOSIS — Z510 Encounter for antineoplastic radiation therapy: Secondary | ICD-10-CM | POA: Diagnosis not present

## 2017-06-24 ENCOUNTER — Ambulatory Visit: Payer: Medicare Other

## 2017-06-24 ENCOUNTER — Ambulatory Visit
Admission: RE | Admit: 2017-06-24 | Discharge: 2017-06-24 | Disposition: A | Discharge status: Home / Self Care | Payer: Medicare Other | Source: Ambulatory Visit | Attending: Radiation Oncology | Admitting: Radiation Oncology

## 2017-06-24 DIAGNOSIS — C61 Malignant neoplasm of prostate: Secondary | ICD-10-CM | POA: Diagnosis not present

## 2017-06-24 DIAGNOSIS — Z510 Encounter for antineoplastic radiation therapy: Secondary | ICD-10-CM | POA: Diagnosis not present

## 2017-06-25 ENCOUNTER — Ambulatory Visit
Admission: RE | Admit: 2017-06-25 | Discharge: 2017-06-25 | Disposition: A | Discharge status: Home / Self Care | Payer: Medicare Other | Source: Ambulatory Visit | Attending: Radiation Oncology | Admitting: Radiation Oncology

## 2017-06-25 DIAGNOSIS — C61 Malignant neoplasm of prostate: Secondary | ICD-10-CM | POA: Diagnosis not present

## 2017-06-25 DIAGNOSIS — Z510 Encounter for antineoplastic radiation therapy: Secondary | ICD-10-CM | POA: Diagnosis not present

## 2017-06-26 ENCOUNTER — Ambulatory Visit
Admission: RE | Admit: 2017-06-26 | Discharge: 2017-06-26 | Disposition: A | Discharge status: Home / Self Care | Payer: Medicare Other | Source: Ambulatory Visit | Attending: Radiation Oncology | Admitting: Radiation Oncology

## 2017-06-26 DIAGNOSIS — C61 Malignant neoplasm of prostate: Secondary | ICD-10-CM | POA: Diagnosis not present

## 2017-06-26 DIAGNOSIS — Z510 Encounter for antineoplastic radiation therapy: Secondary | ICD-10-CM | POA: Diagnosis not present

## 2017-06-29 ENCOUNTER — Ambulatory Visit
Admission: RE | Admit: 2017-06-29 | Discharge: 2017-06-29 | Disposition: A | Discharge status: Home / Self Care | Payer: Medicare Other | Source: Ambulatory Visit | Attending: Radiation Oncology | Admitting: Radiation Oncology

## 2017-06-29 DIAGNOSIS — C61 Malignant neoplasm of prostate: Secondary | ICD-10-CM | POA: Diagnosis not present

## 2017-06-29 DIAGNOSIS — Z510 Encounter for antineoplastic radiation therapy: Secondary | ICD-10-CM | POA: Diagnosis not present

## 2017-07-01 ENCOUNTER — Ambulatory Visit
Admission: RE | Admit: 2017-07-01 | Discharge: 2017-07-01 | Disposition: A | Discharge status: Home / Self Care | Payer: Medicare Other | Source: Ambulatory Visit | Attending: Radiation Oncology | Admitting: Radiation Oncology

## 2017-07-01 DIAGNOSIS — Z510 Encounter for antineoplastic radiation therapy: Secondary | ICD-10-CM | POA: Diagnosis not present

## 2017-07-01 DIAGNOSIS — C61 Malignant neoplasm of prostate: Secondary | ICD-10-CM | POA: Diagnosis not present

## 2017-07-01 NOTE — Telephone Encounter (Signed)
Did get to speak to the patient.  Advised him to stop the Plavix on 07-05-17 and hold it through and including the 11th. He can then resume the Plavix after the procedure.

## 2017-07-02 ENCOUNTER — Ambulatory Visit
Admission: RE | Admit: 2017-07-02 | Discharge: 2017-07-02 | Disposition: A | Discharge status: Home / Self Care | Payer: Medicare Other | Source: Ambulatory Visit | Attending: Radiation Oncology | Admitting: Radiation Oncology

## 2017-07-02 DIAGNOSIS — C61 Malignant neoplasm of prostate: Secondary | ICD-10-CM | POA: Diagnosis not present

## 2017-07-02 DIAGNOSIS — Z510 Encounter for antineoplastic radiation therapy: Secondary | ICD-10-CM | POA: Diagnosis not present

## 2017-07-03 ENCOUNTER — Ambulatory Visit
Admission: RE | Admit: 2017-07-03 | Discharge: 2017-07-03 | Disposition: A | Discharge status: Home / Self Care | Payer: Medicare Other | Source: Ambulatory Visit | Attending: Radiation Oncology | Admitting: Radiation Oncology

## 2017-07-03 DIAGNOSIS — Z510 Encounter for antineoplastic radiation therapy: Secondary | ICD-10-CM | POA: Diagnosis not present

## 2017-07-03 DIAGNOSIS — C61 Malignant neoplasm of prostate: Secondary | ICD-10-CM | POA: Diagnosis not present

## 2017-07-06 ENCOUNTER — Telehealth: Payer: Self-pay | Admitting: *Deleted

## 2017-07-06 ENCOUNTER — Ambulatory Visit
Admission: RE | Admit: 2017-07-06 | Discharge: 2017-07-06 | Disposition: A | Discharge status: Home / Self Care | Payer: Medicare Other | Source: Ambulatory Visit | Attending: Radiation Oncology | Admitting: Radiation Oncology

## 2017-07-06 DIAGNOSIS — Z510 Encounter for antineoplastic radiation therapy: Secondary | ICD-10-CM | POA: Diagnosis not present

## 2017-07-06 DIAGNOSIS — C61 Malignant neoplasm of prostate: Secondary | ICD-10-CM | POA: Diagnosis not present

## 2017-07-06 NOTE — Telephone Encounter (Signed)
Neglected to notice I did speak to the patient on 07-01-2017 and gave him his Plavix clearance instructions.  He verbally understood the instructions.  I call and LM for him today with instructions.

## 2017-07-07 ENCOUNTER — Ambulatory Visit
Admission: RE | Admit: 2017-07-07 | Discharge: 2017-07-07 | Disposition: A | Discharge status: Home / Self Care | Payer: Medicare Other | Source: Ambulatory Visit | Attending: Radiation Oncology | Admitting: Radiation Oncology

## 2017-07-07 DIAGNOSIS — C61 Malignant neoplasm of prostate: Secondary | ICD-10-CM | POA: Diagnosis not present

## 2017-07-07 DIAGNOSIS — Z510 Encounter for antineoplastic radiation therapy: Secondary | ICD-10-CM | POA: Diagnosis not present

## 2017-07-08 ENCOUNTER — Ambulatory Visit
Admission: RE | Admit: 2017-07-08 | Discharge: 2017-07-08 | Disposition: A | Discharge status: Home / Self Care | Payer: Medicare Other | Source: Ambulatory Visit | Attending: Radiation Oncology | Admitting: Radiation Oncology

## 2017-07-08 DIAGNOSIS — C61 Malignant neoplasm of prostate: Secondary | ICD-10-CM | POA: Diagnosis not present

## 2017-07-08 DIAGNOSIS — Z510 Encounter for antineoplastic radiation therapy: Secondary | ICD-10-CM | POA: Diagnosis not present

## 2017-07-09 ENCOUNTER — Ambulatory Visit
Admission: RE | Admit: 2017-07-09 | Discharge: 2017-07-09 | Disposition: A | Discharge status: Home / Self Care | Payer: Medicare Other | Source: Ambulatory Visit | Attending: Radiation Oncology | Admitting: Radiation Oncology

## 2017-07-09 DIAGNOSIS — Z510 Encounter for antineoplastic radiation therapy: Secondary | ICD-10-CM | POA: Diagnosis not present

## 2017-07-09 DIAGNOSIS — C61 Malignant neoplasm of prostate: Secondary | ICD-10-CM | POA: Diagnosis not present

## 2017-07-10 ENCOUNTER — Ambulatory Visit
Admission: RE | Admit: 2017-07-10 | Discharge: 2017-07-10 | Disposition: A | Discharge status: Home / Self Care | Payer: Medicare Other | Source: Ambulatory Visit | Attending: Radiation Oncology | Admitting: Radiation Oncology

## 2017-07-10 ENCOUNTER — Ambulatory Visit (AMBULATORY_SURGERY_CENTER): Payer: Medicare Other | Admitting: Gastroenterology

## 2017-07-10 ENCOUNTER — Encounter: Payer: Self-pay | Admitting: Gastroenterology

## 2017-07-10 ENCOUNTER — Other Ambulatory Visit: Payer: Self-pay

## 2017-07-10 VITALS — BP 139/49 | HR 60 | Temp 97.7°F | Resp 19 | Ht 64.0 in | Wt 142.0 lb

## 2017-07-10 DIAGNOSIS — R1310 Dysphagia, unspecified: Secondary | ICD-10-CM | POA: Diagnosis not present

## 2017-07-10 DIAGNOSIS — K222 Esophageal obstruction: Secondary | ICD-10-CM | POA: Diagnosis not present

## 2017-07-10 DIAGNOSIS — Z510 Encounter for antineoplastic radiation therapy: Secondary | ICD-10-CM | POA: Diagnosis not present

## 2017-07-10 DIAGNOSIS — C61 Malignant neoplasm of prostate: Secondary | ICD-10-CM | POA: Diagnosis not present

## 2017-07-10 MED ORDER — SODIUM CHLORIDE 0.9 % IV SOLN: 500.0000 mL | Freq: Once | INTRAVENOUS | Status: DC

## 2017-07-10 NOTE — Progress Notes (Signed)
Called to room to assist during endoscopic procedure.  Patient ID and intended procedure confirmed with present staff. Received instructions for my participation in the procedure from the performing physician.  

## 2017-07-10 NOTE — Patient Instructions (Signed)
Impression/Recommendations:  Hiatal hernia handout given to patient. Dilation diet handout given to patient.  Continue present medications including Omeprazole. Resume Plavix tomorrow evening.  Repeat upper endoscopy in 3-4 weeks for retreatment to open stricture to a larger diameter.  No Ibuprofen, Naproxen, or other NSAID drugs.  YOU HAD AN ENDOSCOPIC PROCEDURE TODAY AT [facility]:   Refer to the procedure report that was given to you for any specific questions about what was found during the examination.  If the procedure report does not answer your questions, please call your gastroenterologist to clarify.  If you requested that your care partner not be given the details of your procedure findings, then the procedure report has been included in a sealed envelope for you to review at your convenience later.  YOU SHOULD EXPECT: Some feelings of bloating in the abdomen. Passage of more gas than usual.  Walking can help get rid of the air that was put into your GI tract during the procedure and reduce the bloating. If you had a lower endoscopy (such as a colonoscopy or flexible sigmoidoscopy) you may notice spotting of blood in your stool or on the toilet paper. If you underwent a bowel prep for your procedure, you may not have a normal bowel movement for a few days.  Please Note:  You might notice some irritation and congestion in your nose or some drainage.  This is from the oxygen used during your procedure.  There is no need for concern and it should clear up in a day or so.  SYMPTOMS TO REPORT IMMEDIATELY:  Following upper endoscopy (EGD)  Vomiting of blood or coffee ground material  New chest pain or pain under the shoulder blades  Painful or persistently difficult swallowing  New shortness of breath  Fever of 100F or higher  Black, tarry-looking stools  For urgent or emergent issues, a gastroenterologist can be reached at any hour by calling [phone]  [phone].   DIET:  We do recommend a small meal at first, but then you may proceed to your regular diet.  Drink plenty of fluids but you should avoid alcoholic beverages for 24 hours.  ACTIVITY:  You should plan to take it easy for the rest of today and you should NOT DRIVE or use heavy machinery until tomorrow (because of the sedation medicines used during the test).    FOLLOW UP: Our staff will call the number listed on your records the next business day following your procedure to check on you and address any questions or concerns that you may have regarding the information given to you following your procedure. If we do not reach you, we will leave a message.  However, if you are feeling well and you are not experiencing any problems, there is no need to return our call.  We will assume that you have returned to your regular daily activities without incident.  If any biopsies were taken you will be contacted by phone or by letter within the next 1-3 weeks.  Please call us at [phone] if you have not heard about the biopsies in 3 weeks.    SIGNATURES/CONFIDENTIALITY: You and/or your care partner have signed paperwork which will be entered into your electronic medical record.  These signatures attest to the fact that that the information above on your After Visit Summary has been reviewed and is understood.  Full responsibility of the confidentiality of this discharge information lies with you and/or your care-partner.

## 2017-07-10 NOTE — Op Note (Signed)
Robert Schaefer Patient Name: Robert Schaefer Procedure Date: 07/10/2017 10:11 AM MRN: [id number] Endoscopist: Remo Lipps P. Robert Richburg MD, MD Age: 77 Referring MD:  Date of Birth: [birth year] Gender: Male Account #: [id number] Procedure:                Upper GI endoscopy Indications:              Dysphagia, history of distal esophageal stricture Medicines:                Monitored Anesthesia Care Procedure:                Pre-Anesthesia Assessment:                           - Prior to the procedure, a History and Physical                            was performed, and patient medications and                            allergies were reviewed. The patient's tolerance of                            previous anesthesia was also reviewed. The risks                            and benefits of the procedure and the sedation                            options and risks were discussed with the patient.                            All questions were answered, and informed consent                            was obtained. Prior Anticoagulants: The patient has                            taken Plavix (clopidogrel), last dose was 7 days                            prior to procedure. ASA Grade Assessment: III - A                            patient with severe systemic disease. After                            reviewing the risks and benefits, the patient was                            deemed in satisfactory condition to undergo the                            procedure.  After obtaining informed consent, the endoscope was                            passed under direct vision. Throughout the                            procedure, the patient's blood pressure, pulse, and                            oxygen saturations were monitored continuously. The                            Model GIF-HQ190 [id number]) scope was introduced                            through the mouth, and advanced  to the second part                            of duodenum. The upper GI endoscopy was                            accomplished without difficulty. The patient                            tolerated the procedure well. Scope In: Scope Out: Findings:                 A 2 cm hiatal hernia was present.                           A diverticulum with a large opening was found in                            the lower third of the esophagus, just proximal to                            the GEJ, with some residual pill fragments and food                            within it.                           One severe benign-appearing, intrinsic stenosis was                            found at the GEJ. This measured less than one cm                            (in length) and was, tight, roughly 8-34mm in                            diameter, and traversed. A TTS dilator was passed  through the scope. Dilation with an 05-11-12 mm                            balloon dilator was performed to 11 mm, 12 mm and                            13 mm with 2 superficial mucosal wrents appreciated.                           The exam of the esophagus was otherwise normal.                           The entire examined stomach was normal.                           The duodenal bulb and second portion of the                            duodenum were normal. Complications:            No immediate complications. Estimated blood loss:                            Minimal. Estimated Blood Loss:     Estimated blood loss was minimal. Impression:               - 2 cm hiatal hernia.                           - Diverticulum in the lower third of the esophagus.                           - Benign-appearing esophageal stenosis. Dilated to                            61mm with good result as above.                           - Normal stomach.                           - Normal duodenal bulb and second portion of the                             duodenum. Recommendation:           - Patient has a contact number available for                            emergencies. The signs and symptoms of potential                            delayed complications were discussed with the                            patient. Return to normal activities tomorrow.  Written discharge instructions were provided to the                            patient.                           - Post dilation diet.                           - Continue present medications including omeprazole                           - Resume Plavix tomorrow evening                           - Repeat upper endoscopy in 3-4 weeks for                            retreatment to open stricture to a larger diameter.                           - No ibuprofen, naproxen, or other non-steroidal                            anti-inflammatory drugs Robert Schaefer P. Robert Popp MD, MD 07/10/2017 10:37:02 AM This report has been signed electronically.

## 2017-07-10 NOTE — Progress Notes (Signed)
To PACU, VSS. Report to [email or web address] 

## 2017-07-13 ENCOUNTER — Ambulatory Visit
Admission: RE | Admit: 2017-07-13 | Discharge: 2017-07-13 | Disposition: A | Discharge status: Home / Self Care | Payer: Medicare Other | Source: Ambulatory Visit | Attending: Radiation Oncology | Admitting: Radiation Oncology

## 2017-07-13 ENCOUNTER — Ambulatory Visit: Payer: Medicare Other

## 2017-07-13 ENCOUNTER — Telehealth: Payer: Self-pay

## 2017-07-13 DIAGNOSIS — C61 Malignant neoplasm of prostate: Secondary | ICD-10-CM | POA: Diagnosis not present

## 2017-07-13 DIAGNOSIS — Z510 Encounter for antineoplastic radiation therapy: Secondary | ICD-10-CM | POA: Diagnosis not present

## 2017-07-13 NOTE — Telephone Encounter (Signed)
  Follow up Call-  Call back number 07/10/2017  Post procedure Call Back phone  # [phone]  Permission to leave phone message Yes  Some recent data might be hidden     Patient questions:  Do you have a fever, pain , or abdominal swelling? No. Pain Score  0 *  Have you tolerated food without any problems? Yes.    Have you been able to return to your normal activities? Yes.    Do you have any questions about your discharge instructions: Diet   No. Medications  No. Follow up visit  No.  Do you have questions or concerns about your Care? No.  Actions: * If pain score is 4 or above: No action needed, pain <4.

## 2017-07-14 ENCOUNTER — Ambulatory Visit
Admission: RE | Admit: 2017-07-14 | Discharge: 2017-07-14 | Disposition: A | Discharge status: Home / Self Care | Payer: Medicare Other | Source: Ambulatory Visit | Attending: Radiation Oncology | Admitting: Radiation Oncology

## 2017-07-14 ENCOUNTER — Ambulatory Visit: Payer: Medicare Other

## 2017-07-14 DIAGNOSIS — Z510 Encounter for antineoplastic radiation therapy: Secondary | ICD-10-CM | POA: Diagnosis not present

## 2017-07-14 DIAGNOSIS — C61 Malignant neoplasm of prostate: Secondary | ICD-10-CM | POA: Diagnosis not present

## 2017-07-15 ENCOUNTER — Telehealth: Payer: Self-pay | Admitting: *Deleted

## 2017-07-15 ENCOUNTER — Ambulatory Visit: Payer: Medicare Other

## 2017-07-15 NOTE — Telephone Encounter (Signed)
Patient is for a repeat EGD with dil. On 08/18/17. Last EGD was 07/10/17 at [facility]. Per  Phone note 06/17/17  Ok to hold Plavix 5-7 days.  Does patient need to hold Plavix 5-7 days before EGD as he did previously? Please advise. Thank you, Robbin PV

## 2017-07-16 ENCOUNTER — Encounter: Payer: Self-pay | Admitting: Radiation Oncology

## 2017-07-16 NOTE — Progress Notes (Signed)
  Radiation Oncology         [phone] ________________________________  Name: Robert Schaefer MRN: [id number]  Date: 07/16/2017  DOB: 09/13/1940  End of Treatment Note  Diagnosis:   Stage T1c adenocarcinoma of the prostate with Gleason Score of 4+5 and PSA of 8.15  Indication for treatment:  Curative, Definitive Radiotherapy       Radiation treatment dates:   05/14/17 - 07/14/16  Site/dose:  1. The prostate, seminal vesicles, and pelvic lymph nodes were initially treated to 45 Gy in 25 fractions of 1.8 Gy  2. The prostate only was boosted to 75 Gy with 15 additional fractions of 2.0 Gy   Beams/energy:  1. The prostate, seminal vesicles, and pelvic lymph nodes were initially treated using helical intensity modulated radiotherapy delivering 6 megavolt photons. Image guidance was performed with megavoltage CT studies prior to each fraction. He was immobilized with a body fix lower extremity mold.  2. the prostate only was boosted using helical intensity modulated radiotherapy delivering 6 megavolt photons. Image guidance was performed with megavoltage CT studies prior to each fraction. He was immobilized with a body fix lower extremity mold.  Narrative: The patient tolerated radiation treatment relatively well with only minor urinary irritation and denied fatigue or significant bowel issues.  Plan: The patient has completed radiation treatment. He will return to radiation oncology clinic for routine followup in one month. I advised him to call or return sooner if he has any questions or concerns related to his recovery or treatment. ________________________________  Sheral Apley. [name], M.D.  This document serves as a record of services personally performed by Tyler Pita, MD. It was created on his behalf by Linward Natal, a trained medical scribe. The creation of this record is based on the scribe's personal observations and the provider's statements to them. This document has been  checked and approved by the attending provider.

## 2017-07-16 NOTE — Telephone Encounter (Signed)
Yes he will need approval to hold Plavix for 5 days prior to the procedure for planned dilation. thanks

## 2017-07-20 NOTE — Telephone Encounter (Signed)
   Primary Cardiologist: No primary care provider on file.  Chart reviewed as part of pre-operative protocol coverage. Given past medical history and time since last visit, based on ACC/AHA guidelines, Robert Schaefer would be at acceptable risk for the planned procedure without further cardiovascular testing.   It is okay for him to hold his Plavix for 5 days prior to the procedure.    I will route this to Dr. Doyne Keel staff.  We will contact the patient to tell him when he should hold his Plavix.  I will route this recommendation to the requesting party via Epic fax function and remove from pre-op pool.  Please call with questions.  [name], PA-C 07/20/2017, 3:08 PM

## 2017-07-20 NOTE — Telephone Encounter (Signed)
   Robert Schaefer [birth year] [id number]  Dear Cardiology Team (Dr [name]):  We have scheduled the above named patient for a(n) endoscopy with dilation procedure. Our records show that (s)he is on anticoagulation therapy.  Please advise as to whether the patient may come off their therapy of Plavix 5 days prior to their procedure which is scheduled for 08/18/17.  Please route your response to Dixon Boos, CMA.  Sincerely,  [facility]

## 2017-07-20 NOTE — Telephone Encounter (Signed)
Thanks very much.

## 2017-07-23 ENCOUNTER — Ambulatory Visit (AMBULATORY_SURGERY_CENTER): Payer: Self-pay | Admitting: *Deleted

## 2017-07-23 ENCOUNTER — Other Ambulatory Visit: Payer: Self-pay

## 2017-07-23 VITALS — Ht 65.0 in | Wt 138.8 lb

## 2017-07-23 DIAGNOSIS — R1310 Dysphagia, unspecified: Secondary | ICD-10-CM

## 2017-07-23 NOTE — Progress Notes (Signed)
No egg or soy allergy known to patient  No issues with past sedation with any surgeries  or procedures, no intubation problems  No diet pills per patient No home 02 use per patient  On blood thinners Hold for 5 days Pt denies issues with constipation  No A fib or A flutter  EMMI video sent to pt's e mail  Pt. Just recently had done.

## 2017-08-11 ENCOUNTER — Encounter: Payer: Medicare Other | Admitting: Gastroenterology

## 2017-08-14 ENCOUNTER — Encounter: Payer: Self-pay | Admitting: Urology

## 2017-08-14 ENCOUNTER — Other Ambulatory Visit: Payer: Self-pay

## 2017-08-14 ENCOUNTER — Ambulatory Visit
Admission: RE | Admit: 2017-08-14 | Discharge: 2017-08-14 | Disposition: A | Discharge status: Home / Self Care | Payer: Medicare Other | Source: Ambulatory Visit | Attending: Urology | Admitting: Urology

## 2017-08-14 VITALS — BP 145/42 | HR 79 | Temp 98.0°F | Resp 18 | Ht 65.0 in | Wt 138.8 lb

## 2017-08-14 DIAGNOSIS — R5383 Other fatigue: Secondary | ICD-10-CM | POA: Insufficient documentation

## 2017-08-14 DIAGNOSIS — R351 Nocturia: Secondary | ICD-10-CM | POA: Diagnosis not present

## 2017-08-14 DIAGNOSIS — R339 Retention of urine, unspecified: Secondary | ICD-10-CM | POA: Diagnosis not present

## 2017-08-14 DIAGNOSIS — R350 Frequency of micturition: Secondary | ICD-10-CM | POA: Insufficient documentation

## 2017-08-14 DIAGNOSIS — Z7982 Long term (current) use of aspirin: Secondary | ICD-10-CM | POA: Diagnosis not present

## 2017-08-14 DIAGNOSIS — C61 Malignant neoplasm of prostate: Secondary | ICD-10-CM

## 2017-08-14 DIAGNOSIS — Z79899 Other long term (current) drug therapy: Secondary | ICD-10-CM | POA: Diagnosis not present

## 2017-08-14 DIAGNOSIS — Z923 Personal history of irradiation: Secondary | ICD-10-CM | POA: Diagnosis not present

## 2017-08-14 NOTE — Progress Notes (Signed)
Radiation Oncology         [phone] ________________________________  Name: Robert Schaefer MRN: [id number]  Date: 08/14/2017  DOB: [birth year]  Post Treatment Note  CC: [name], MD  [name], MD  Diagnosis:   Stage T1c adenocarcinoma of the prostate with Gleason Score of 4+5 and PSA of 8.15  Interval Since Last Radiation:  4.5 weeks  05/14/17 - 07/14/16: 1. The prostate, seminal vesicles, and pelvic lymph nodes were initially treated to 45 Gy in 25 fractions of 1.8 Gy  2. The prostate only was boosted to 75 Gy with 15 additional fractions of 2.0 Gy   Narrative:  The patient returns today for routine follow-up. He tolerated radiation treatment relatively well with only minor urinary irritation and denied fatigue or significant bowel issues.  He continued on ADT throughout his course of treatment (started 03/12/17).                          On review of systems, the patient states that he is doing well overall.  He continues with significant frequency, weak stream, intermittency and feelings of incomplete emptying.  He reports nocturia x3 per night.  His current IPSS score is 20 indicating severe urinary symptoms.  His pretreatment IPS S score was 2.  He is taking Uroxatrol daily.  He does feel like his urinary symptoms are gradually improving.  He denies dysuria, gross hematuria or incontinence.  He continues with mild fatigue which he attributes to the ADT.  Otherwise he feels well.  ALLERGIES:  is allergic to penicillins; sulfa antibiotics; and versed [midazolam].  Meds: Current Outpatient Medications  Medication Sig Dispense Refill  . alfuzosin (UROXATRAL) 10 MG 24 hr tablet Take 1 tablet (10 mg total) by mouth daily with breakfast. 30 tablet 0  . aspirin EC 81 MG tablet Take 81 mg by mouth daily.    . Cholecalciferol (VITAMIN D3) 1000 units CAPS Take 2 capsules by mouth daily. Takes along with 5000 units to make a total of 7000 units per day    . clopidogrel (PLAVIX)  75 MG tablet Take 75 mg daily by mouth.    . fluticasone (FLONASE) 50 MCG/ACT nasal spray Place 2 sprays into both nostrils daily. (Patient taking differently: Place 2 sprays into both nostrils daily as needed for allergies. ) 16 g 3  . levothyroxine (SYNTHROID, LEVOTHROID) 50 MCG tablet Take 1 tablet (50 mcg total) by mouth daily. 90 tablet 1  . albuterol (PROVENTIL HFA;VENTOLIN HFA) 108 (90 Base) MCG/ACT inhaler Inhale 2 puffs into the lungs every 6 (six) hours as needed for wheezing or shortness of breath. (Patient not taking: Reported on 08/14/2017) 1 Inhaler 6   Current Facility-Administered Medications  Medication Dose Route Frequency Provider Last Rate Last Dose  . 0.9 %  sodium chloride infusion  500 mL Intravenous Once Armbruster, Carlota Raspberry, MD        Physical Findings:  height is 5\' 5"  (1.651 m) and weight is 138 lb 12.8 oz (63 kg). His oral temperature is 98 F (36.7 C). His blood pressure is 145/42 (abnormal) and his pulse is 79. His respiration is 18 and oxygen saturation is 98%.  Pain Assessment Pain Score: 0-No pain/10 In general this is a well appearing Caucasian male in no acute distress.  He's alert and oriented x4 and appropriate throughout the examination. Cardiopulmonary assessment is negative for acute distress and he exhibits normal effort.   Lab Findings: Lab Results  Component Value Date   WBC 11.0 (H) 05/07/2017   HGB 12.5 (L) 05/07/2017   HCT 35.0 (L) 05/07/2017   MCV 90.7 05/07/2017   PLT 148 05/07/2017     Radiographic Findings: No results found.  Impression/Plan: 1. Stage T1c adenocarcinoma of the prostate with Gleason Score of 4+5 and PSA of 8.15.  He will continue to follow up with urology for ongoing PSA determinations and will call for an appointment with Dr. [name] in April 2019. He has continued to tolerate the ADT fairly well and intends to complete a full 2 year course of treatment (started 03/12/17).  He will be due for his next ADT injection in late  March or early April.  He understands what to expect with regards to PSA monitoring going forward. I will look forward to following his response to treatment via correspondence with urology, and would be happy to continue to participate in his care if clinically indicated. I talked to the patient about what to expect in the future, including his risk for erectile dysfunction and rectal bleeding. I encouraged him to call or return to the office if he has any questions regarding his previous radiation or possible radiation side effects. He was comfortable with this plan and will follow up as needed.    Nicholos Johns, PA-C

## 2017-08-14 NOTE — Addendum Note (Signed)
Encounter addended by: Malena Edman, RN on: 08/14/2017 3:54 PM  Actions taken: Charge Capture section accepted

## 2017-08-17 ENCOUNTER — Ambulatory Visit: Payer: Self-pay | Admitting: Internal Medicine

## 2017-08-18 ENCOUNTER — Encounter: Payer: Self-pay | Admitting: Gastroenterology

## 2017-08-18 ENCOUNTER — Encounter: Payer: Medicare Other | Admitting: Gastroenterology

## 2017-08-18 ENCOUNTER — Ambulatory Visit (AMBULATORY_SURGERY_CENTER): Payer: Medicare Other | Admitting: Gastroenterology

## 2017-08-18 ENCOUNTER — Other Ambulatory Visit: Payer: Self-pay

## 2017-08-18 VITALS — BP 127/55 | HR 96 | Temp 97.7°F | Resp 25 | Ht 65.0 in | Wt 138.0 lb

## 2017-08-18 DIAGNOSIS — R1310 Dysphagia, unspecified: Secondary | ICD-10-CM | POA: Diagnosis not present

## 2017-08-18 DIAGNOSIS — J449 Chronic obstructive pulmonary disease, unspecified: Secondary | ICD-10-CM | POA: Diagnosis not present

## 2017-08-18 DIAGNOSIS — K222 Esophageal obstruction: Secondary | ICD-10-CM | POA: Diagnosis not present

## 2017-08-18 DIAGNOSIS — I739 Peripheral vascular disease, unspecified: Secondary | ICD-10-CM | POA: Diagnosis not present

## 2017-08-18 MED ORDER — SODIUM CHLORIDE 0.9 % IV SOLN: 500.0000 mL | Freq: Once | INTRAVENOUS | Status: DC

## 2017-08-18 NOTE — Patient Instructions (Signed)
FOLLOW POST DILATION DIET TODAY, CLEAR LIQUIDS UNTIL 11:30 AM, THEN FOLLOW SOFT DIET INDEFINITELY  YOU CAN GO BACK ON PLAVIX TOMORROW NIGHT  SLEEP WITH HEAD OF BED ELEVATED, AVOID EATING AND DRINKING 2 HOURS BEFORE BEDTIME   YOU HAD AN ENDOSCOPIC PROCEDURE TODAY AT [facility]:   Refer to the procedure report that was given to you for any specific questions about what was found during the examination.  If the procedure report does not answer your questions, please call your gastroenterologist to clarify.  If you requested that your care partner not be given the details of your procedure findings, then the procedure report has been included in a sealed envelope for you to review at your convenience later.  YOU SHOULD EXPECT: Some feelings of bloating in the abdomen. Passage of more gas than usual.  Walking can help get rid of the air that was put into your GI tract during the procedure and reduce the bloating. If you had a lower endoscopy (such as a colonoscopy or flexible sigmoidoscopy) you may notice spotting of blood in your stool or on the toilet paper. If you underwent a bowel prep for your procedure, you may not have a normal bowel movement for a few days.  Please Note:  You might notice some irritation and congestion in your nose or some drainage.  This is from the oxygen used during your procedure.  There is no need for concern and it should clear up in a day or so.  SYMPTOMS TO REPORT IMMEDIATELY:    Following upper endoscopy (EGD)  Vomiting of blood or coffee ground material  New chest pain or pain under the shoulder blades  Painful or persistently difficult swallowing  New shortness of breath  Fever of 100F or higher  Black, tarry-looking stools  For urgent or emergent issues, a gastroenterologist can be reached at any hour by calling [phone].   DIET:  We do recommend a small meal at first, but then you may proceed to your regular diet.  Drink plenty of  fluids but you should avoid alcoholic beverages for 24 hours.  ACTIVITY:  You should plan to take it easy for the rest of today and you should NOT DRIVE or use heavy machinery until tomorrow (because of the sedation medicines used during the test).    FOLLOW UP: Our staff will call the number listed on your records the next business day following your procedure to check on you and address any questions or concerns that you may have regarding the information given to you following your procedure. If we do not reach you, we will leave a message.  However, if you are feeling well and you are not experiencing any problems, there is no need to return our call.  We will assume that you have returned to your regular daily activities without incident.  If any biopsies were taken you will be contacted by phone or by letter within the next 1-3 weeks.  Please call us at [phone] if you have not heard about the biopsies in 3 weeks.    SIGNATURES/CONFIDENTIALITY: You and/or your care partner have signed paperwork which will be entered into your electronic medical record.  These signatures attest to the fact that that the information above on your After Visit Summary has been reviewed and is understood.  Full responsibility of the confidentiality of this discharge information lies with you and/or your care-partner.

## 2017-08-18 NOTE — Op Note (Signed)
[facility] Patient Name: Robert Schaefer Procedure Date: 08/18/2017 9:49 AM MRN: [id number] Endoscopist: Remo Lipps P. [place] Gamero MD, MD Age: 77 Referring MD:  Date of Birth: [birth year] Gender: Male Account #: [id number] Procedure:                Upper GI endoscopy Indications:              Dysphagia, history of severe esophageal stricture Medicines:                Monitored Anesthesia Care Procedure:                Pre-Anesthesia Assessment:                           - Prior to the procedure, a History and Physical                            was performed, and patient medications and                            allergies were reviewed. The patient's tolerance of                            previous anesthesia was also reviewed. The risks                            and benefits of the procedure and the sedation                            options and risks were discussed with the patient.                            All questions were answered, and informed consent                            was obtained. Prior Anticoagulants: The patient has                            taken Plavix (clopidogrel), last dose was 6 days                            prior to procedure. ASA Grade Assessment: III - A                            patient with severe systemic disease. After                            reviewing the risks and benefits, the patient was                            deemed in satisfactory condition to undergo the                            procedure.  After obtaining informed consent, the endoscope was                            passed under direct vision. Throughout the                            procedure, the patient's blood pressure, pulse, and                            oxygen saturations were monitored continuously. The                            Endoscope was introduced through the mouth, and                            advanced to the second part of  duodenum. The upper                            GI endoscopy was accomplished without difficulty.                            The patient tolerated the procedure but required                            supplemental oxygen. Scope In: Scope Out: Findings:                 Esophagogastric landmarks were identified: the                            Z-line was found at 39 cm, the gastroesophageal                            junction was found at 39 cm and the upper extent of                            the gastric folds was found at 42 cm from the                            incisors.                           A 3 cm hiatal hernia was present.                           A diverticulum with a large opening was found in                            the lower third of the esophagus just proximal to                            the GEJ.                           One moderate benign-appearing,  intrinsic stenosis                            was found. This measured less than one cm (in                            length) and was traversed. A TTS dilator was passed                            through the scope. Dilation with a 13.5-14.5-15.5                            mm balloon dilator was performed to 13.5 mm and                            14.5 mm with an appropriate mucosal wrent.                           The esophagus contained some residual secretions                            which were suctioned and suspected pill fragment or                            small area of residual food which was removed with                            the Roth net. The exam of the esophagus was                            otherwise normal.                           The entire examined stomach was normal.                           The duodenal bulb and second portion of the                            duodenum were normal. Complications:            No immediate complications. Estimated blood loss:                             Minimal. Estimated Blood Loss:     Estimated blood loss was minimal. Impression:               - Esophagogastric landmarks identified.                           - 3 cm hiatal hernia.                           - Diverticulum in the lower third of the esophagus  likely related to esophageal stenosis.                           - Benign-appearing esophageal stenosis. Dilated to                            14.63mm with good result.                           - Some retained secretions / food which was                            suctioned and removed with Jabier Mutton net                           - Normal stomach.                           - Normal duodenal bulb and second portion of the                            duodenum.                           Will await course following dilation. Esophageal                            manometry should be considered to exclude achalasia                            (if patient could tolerate this - concern it could                            coil in diverticulum) versus barium swallow. Recommendation:           - Patient has a contact number available for                            emergencies. The signs and symptoms of potential                            delayed complications were discussed with the                            patient. Return to normal activities tomorrow.                            Written discharge instructions were provided to the                            patient.                           - Post-dilation diet today                           -  Soft diet indefinitely                           - Continue present medications.                           - Resume Plavix tomorrow night                           - Consideration for barium swallow versus                            esophageal manometry Quenisha Lovins P. Anais Denslow MD, MD 08/18/2017 10:21:50 AM This report has been signed electronically.

## 2017-08-18 NOTE — Progress Notes (Signed)
Pt's states no medical or surgical changes since previsit or office visit. 

## 2017-08-18 NOTE — Progress Notes (Signed)
Called to room to assist during endoscopic procedure.  Patient ID and intended procedure confirmed with present staff. Received instructions for my participation in the procedure from the performing physician.  

## 2017-08-19 ENCOUNTER — Telehealth: Payer: Self-pay

## 2017-08-19 NOTE — Telephone Encounter (Signed)
Left message on answering machine. 

## 2017-08-19 NOTE — Telephone Encounter (Signed)
  Follow up Call-  Call back number 08/18/2017 07/10/2017  Post procedure Call Back phone  # [phone] hm [phone]  Permission to leave phone message Yes Yes  Some recent data might be hidden     Patient questions:  Do you have a fever, pain , or abdominal swelling? No. Pain Score  0 *  Have you tolerated food without any problems? Yes.    Have you been able to return to your normal activities? Yes.    Do you have any questions about your discharge instructions: Diet   No. Medications  No. Follow up visit  No.  Do you have questions or concerns about your Care? No.  Actions: * If pain score is 4 or above: No action needed, pain <4.

## 2017-08-20 ENCOUNTER — Other Ambulatory Visit: Payer: Self-pay | Admitting: Radiation Oncology

## 2017-08-21 ENCOUNTER — Other Ambulatory Visit: Payer: Self-pay

## 2017-08-21 ENCOUNTER — Telehealth: Payer: Self-pay | Admitting: Radiation Oncology

## 2017-08-21 ENCOUNTER — Telehealth: Payer: Self-pay

## 2017-08-21 DIAGNOSIS — D649 Anemia, unspecified: Secondary | ICD-10-CM

## 2017-08-21 DIAGNOSIS — K222 Esophageal obstruction: Secondary | ICD-10-CM

## 2017-08-21 DIAGNOSIS — R1310 Dysphagia, unspecified: Secondary | ICD-10-CM

## 2017-08-21 NOTE — Telephone Encounter (Signed)
-----   Message from [name], MD sent at 08/21/2017  7:45 AM EST ----- Regarding: barium swallow Hi Robert Schaefer, I think this patient warrants a barium swallow following his EGD this week. Can you call to help schedule, I want to assess his motility. I'd like it done WITHOUT a tablet (I think the tablet will not pass the stricture). Thanks

## 2017-08-21 NOTE — Telephone Encounter (Signed)
Received refill request for alfuzosin from [facility], [place].Phoned Megan there. Explained the patient is no longer under the care of Dr. [name] and should seek this refill from his urologist. Jinny Blossom confirmed they would re route the request.

## 2017-08-21 NOTE — Telephone Encounter (Signed)
Patient scheduled for barium swallow, given number to radiology scheduling if he needs to reschedule due to ride. Patient understands to be NPO 3 hours prior. Instructed patient that he should not have to swallow tablet as Dr. Havery Moros concerned about area of stricture.

## 2017-08-27 ENCOUNTER — Ambulatory Visit (HOSPITAL_COMMUNITY): Payer: Medicare Other

## 2017-09-03 ENCOUNTER — Ambulatory Visit (HOSPITAL_COMMUNITY)
Admission: RE | Admit: 2017-09-03 | Discharge: 2017-09-03 | Disposition: A | Discharge status: Home / Self Care | Payer: Medicare Other | Source: Ambulatory Visit | Attending: Gastroenterology | Admitting: Gastroenterology

## 2017-09-03 DIAGNOSIS — R1310 Dysphagia, unspecified: Secondary | ICD-10-CM | POA: Insufficient documentation

## 2017-09-03 DIAGNOSIS — K224 Dyskinesia of esophagus: Secondary | ICD-10-CM | POA: Insufficient documentation

## 2017-09-03 DIAGNOSIS — K222 Esophageal obstruction: Secondary | ICD-10-CM | POA: Insufficient documentation

## 2017-09-03 DIAGNOSIS — K225 Diverticulum of esophagus, acquired: Secondary | ICD-10-CM | POA: Diagnosis not present

## 2017-09-03 DIAGNOSIS — K449 Diaphragmatic hernia without obstruction or gangrene: Secondary | ICD-10-CM | POA: Insufficient documentation

## 2017-09-08 ENCOUNTER — Telehealth: Payer: Self-pay

## 2017-09-08 NOTE — Telephone Encounter (Signed)
Spoke to patient's daughter, unfortunately 3/27 will not work for this patient. The only dates in April that will work are 4/15 or 4/17, as it needs to be on a Monday or Wednesday due to manometry schedule. Please advise.

## 2017-09-08 NOTE — Telephone Encounter (Signed)
Spoke to patient let him know the date of the procedure, 3/27 at 1:00 pm. He is not sure this date/time will work due to his daughter's schedule. He will check with her and call us back later today.

## 2017-09-08 NOTE — Telephone Encounter (Signed)
Patient's daughter [name] calling back asking if esophageal manometry can be done earlier in the day. Best call back # [phone]

## 2017-09-09 ENCOUNTER — Other Ambulatory Visit: Payer: Self-pay

## 2017-09-09 DIAGNOSIS — K222 Esophageal obstruction: Secondary | ICD-10-CM

## 2017-09-09 NOTE — Telephone Encounter (Signed)
4/17 is okay. Thanks

## 2017-09-09 NOTE — Telephone Encounter (Signed)
Patient had to be scheduled for EGD and manometry on 4/15, [facility]. EGD at 0730 and EM at 10:30. Spoke to patient's daughter, she is aware and prep instructions mailed to patient. Dr. Havery Moros okay'd 4/15 due to NO manometry's to be scheduled on 4/17.

## 2017-09-10 ENCOUNTER — Other Ambulatory Visit: Payer: Self-pay

## 2017-09-24 ENCOUNTER — Emergency Department (HOSPITAL_COMMUNITY)
Admission: EM | Admit: 2017-09-24 | Discharge: 2017-09-25 | Disposition: A | Discharge status: Home / Self Care | Payer: Medicare Other | Attending: Emergency Medicine | Admitting: Emergency Medicine

## 2017-09-24 ENCOUNTER — Encounter (HOSPITAL_COMMUNITY): Payer: Self-pay

## 2017-09-24 DIAGNOSIS — L7622 Postprocedural hemorrhage and hematoma of skin and subcutaneous tissue following other procedure: Secondary | ICD-10-CM | POA: Diagnosis not present

## 2017-09-24 DIAGNOSIS — K08409 Partial loss of teeth, unspecified cause, unspecified class: Secondary | ICD-10-CM | POA: Diagnosis not present

## 2017-09-24 DIAGNOSIS — I10 Essential (primary) hypertension: Secondary | ICD-10-CM | POA: Insufficient documentation

## 2017-09-24 DIAGNOSIS — K91841 Postprocedural hemorrhage and hematoma of a digestive system organ or structure following other procedure: Secondary | ICD-10-CM | POA: Diagnosis not present

## 2017-09-24 DIAGNOSIS — J449 Chronic obstructive pulmonary disease, unspecified: Secondary | ICD-10-CM | POA: Diagnosis not present

## 2017-09-24 DIAGNOSIS — Z79899 Other long term (current) drug therapy: Secondary | ICD-10-CM | POA: Insufficient documentation

## 2017-09-24 DIAGNOSIS — Z7901 Long term (current) use of anticoagulants: Secondary | ICD-10-CM | POA: Insufficient documentation

## 2017-09-24 DIAGNOSIS — Z87891 Personal history of nicotine dependence: Secondary | ICD-10-CM | POA: Diagnosis not present

## 2017-09-24 DIAGNOSIS — Z8546 Personal history of malignant neoplasm of prostate: Secondary | ICD-10-CM | POA: Insufficient documentation

## 2017-09-24 DIAGNOSIS — T819XXA Unspecified complication of procedure, initial encounter: Secondary | ICD-10-CM

## 2017-09-24 DIAGNOSIS — Z7982 Long term (current) use of aspirin: Secondary | ICD-10-CM | POA: Diagnosis not present

## 2017-09-24 MED ORDER — LIDOCAINE-EPINEPHRINE (PF) 2 %-1:200000 IJ SOLN
20.0000 mL | Freq: Once | INTRAMUSCULAR | Status: AC
  Administered 2017-09-25: 20 mL
  Filled 2017-09-24: qty 20

## 2017-09-24 NOTE — ED Triage Notes (Signed)
Pt had 5 teeth removed this afternoon and has been bleeding since then, he stopped his plavix 5 days prior to having teeth removed Pt tried to use tea bags to stop the bleeding with no success

## 2017-09-24 NOTE — ED Provider Notes (Signed)
[facility] DEPT Provider Note: Robert Spurling, MD, FACEP  CSN: [id number] MRN: [id number] ARRIVAL: 09/24/17 at [place]: [id number]   CHIEF COMPLAINT  Post-Extraction Bleeding   HISTORY OF PRESENT ILLNESS  09/24/17 11:54 PM Robert Schaefer is a 77 y.o. male who had 5 teeth extracted this afternoon between 3:30 PM and 5 PM.  The extraction sites in the upper jaw were packed but have continued to bleed since being discharged home from the dentist's office.  He has repacked them multiple times with gauze and with tea bags without cessation of the bleeding.  He has had clots develop as well.  He is having some moderate pain at the extraction sites as well.  He had been on Plavix but discontinued Plavix for 5 days before the extractions.   Past Medical History:  Diagnosis Date  . AAA (abdominal aortic aneurysm) ([place])    found by berry at periphral vascular catherization w/ stenting 05-29-2016--- duplex 06-09-2016 distal AAA measures 3.0cm x 3.1cm  . Anticoagulant long-term use    plavix  . Carotid stenosis, asymptomatic, bilateral    per duplex --- bilateral <50% ICA  . Cataract    removed  . COPD with emphysema [facility])    pulmologist-  dr [name]  . Dyspnea on exertion   . First degree heart block   . GERD (gastroesophageal reflux disease)   . Heart murmur   . Hiatal hernia   . HOH (hard of hearing)   . Hyperlipidemia    no meds normal presently  . Hypertension    pt stopped taking losartan apprx. April 2018 , per pt pcp instructed him to stop  . Hypothyroidism   . IBS (irritable bowel syndrome)    intermittant constipation  . Lesion of left lung    followed by dr [name] (pulmologist)  post infection scarring  . Lower urinary tract symptoms (LUTS)   . Mild dementia   . Moderate aortic stenosis    per last echo --  valve area 1.1cm^2 ,    . OSA (obstructive sleep apnea)    has a cpap-  intolerant  . Peripheral vascular disease [facility])    cardiologist-- dr berry-- per last  ABI/Aorta/Iliac 06-09-2016  patent bilateral iliac kissing stents, >50% stenosis bilateral iliac artery (R>L) and left external iliac artery, patent IVC  . Prostate cancer [facility]) urologist-  dr [name]  oncologist-  dr [name]   dx 03/ 2018-- Stage T1c, Gleason 4+5, PSA 8.15, vol 36cc--- currently ADT and planned external radiation  . RLS (restless legs syndrome)   . Schatzki's ring   . Sleep apnea    has CPAP but doesn't use due to Clostaphobia  . Wears glasses   . Wears partial dentures    upper    Past Surgical History:  Procedure Laterality Date  . CARDIOVASCULAR STRESS TEST  04-23-2016   dr berry   normal nuclear study w/ no ischemia/  normal LV function and wall function, nuclear stress ef 57%  . CATARACT EXTRACTION W/ INTRAOCULAR LENS  IMPLANT, BILATERAL Bilateral   . COLONOSCOPY    . ESOPHAGOGASTRODUODENOSCOPY (EGD) WITH ESOPHAGEAL DILATION  last one 11-09-2015  dr Yolanda Bonine   dilation schatzki's ring  . GOLD SEED IMPLANT N/A 04/29/2017   Procedure: GOLD SEED IMPLANT;  Surgeon: [name], MD;  Location: [facility];  Service: Urology;  Laterality: N/A;  . HERNIA REPAIR    . INGUINAL HERNIA REPAIR Right 02/23/2014   Procedure: HERNIA REPAIR INGUINAL ADULT;  Surgeon:  Thomas A. Cornett, MD;  Location: [place];  Service: General;  Laterality: Right;  . INSERTION OF MESH Right 02/23/2014   Procedure: INSERTION OF MESH;  Surgeon: Joyice Faster. Cornett, MD;  Location: [place];  Service: General;  Laterality: Right;  . PERIPHERAL VASCULAR CATHETERIZATION N/A 05/29/2016   Procedure: Lower Extremity Angiography;  Surgeon: [name], MD;  Location: [place] CV LAB;  Service: Cardiovascular;  Laterality: N/A; Diamondback orbital rotational atherectomy, PTA and stenting bilateral common iliac arteries   . PILONIDAL CYST / SINUS EXCISION  2000  . PROSTATE BIOPSY  09-22-2016  dr [name] office  . SPACE OAR INSTILLATION N/A 04/29/2017    Procedure: SPACE OAR INSTILLATION;  Surgeon: [name], MD;  Location: [facility];  Service: Urology;  Laterality: N/A;  . TRANSTHORACIC ECHOCARDIOGRAM  05-05-2016   dr berry   ef 09-32%,  grade 1diastolic dysfunction/  moderate AV stenosis with mod. AR (right coronary and noncoronary cusp mobility seversly restricted);  valve area 1.25cm^2, mean gradient 27 mmHg, peak gradient 81mmHg/  mild MR  . TREATMENT FISTULA ANAL  2010  . UPPER GASTROINTESTINAL ENDOSCOPY      Family History  Problem Relation Age of Onset  . Heart attack Mother   . Stroke Father   . Asthma Father   . Cancer Neg Hx   . Colon cancer Neg Hx   . Stomach cancer Neg Hx   . Esophageal cancer Neg Hx   . Rectal cancer Neg Hx   . Pancreatic cancer Neg Hx   . Prostate cancer Neg Hx     Social History   Tobacco Use  . Smoking status: Former Smoker    Packs/day: 1.00    Years: 50.00    Pack years: 50.00    Types: Cigarettes    Last attempt to quit: 07/05/2005    Years since quitting: 12.2  . Smokeless tobacco: Never Used  Substance Use Topics  . Alcohol use: No  . Drug use: No    Prior to Admission medications   Medication Sig Start Date End Date Taking? Authorizing Provider  albuterol (PROVENTIL HFA;VENTOLIN HFA) 108 (90 Base) MCG/ACT inhaler Inhale 2 puffs into the lungs every 6 (six) hours as needed for wheezing or shortness of breath. Patient not taking: Reported on 08/14/2017 01/09/17   Rigoberto Noel, MD  alfuzosin (UROXATRAL) 10 MG 24 hr tablet Take 1 tablet (10 mg total) by mouth daily with breakfast. 05/20/17   Kyung Rudd, MD  aspirin EC 81 MG tablet Take 81 mg by mouth daily.    [provider]  Cholecalciferol (VITAMIN D3) 1000 units CAPS Take 2 capsules by mouth daily. Takes along with 5000 units to make a total of 7000 units per day    [provider]  clopidogrel (PLAVIX) 75 MG tablet Take 75 mg daily by mouth.    [provider]  fluticasone (FLONASE)  50 MCG/ACT nasal spray Place 2 sprays into both nostrils daily. Patient taking differently: Place 2 sprays into both nostrils daily as needed for allergies.  09/15/16   [name], MD  levothyroxine (SYNTHROID, LEVOTHROID) 50 MCG tablet Take 1 tablet (50 mcg total) by mouth daily. 12/22/16   [name], MD    Allergies Penicillins; Sulfa antibiotics; and Versed [midazolam]   REVIEW OF SYSTEMS  Negative except as noted here or in the History of Present Illness.   PHYSICAL EXAMINATION  Initial Vital Signs Blood pressure (!) 125/52, pulse 82,  resp. rate 20, SpO2 95 %.  Examination General: Well-developed, well-nourished male in no acute distress; appearance consistent with age of record HENT: normocephalic; multiple upper extraction sockets with adherent clots and oozing of blood Eyes: pupils equal, round and reactive to light; extraocular muscles intact Neck: supple Heart: regular rate and rhythm Lungs: clear to auscultation bilaterally Abdomen: soft; nondistended; nontender; bowel sounds present Extremities: No deformity; full range of motion; pulses normal Neurologic: Awake, alert and oriented; motor function intact in all extremities and symmetric; no facial droop Skin: Warm and dry Psychiatric: Normal mood and affect   RESULTS  Summary of this visit's results, reviewed by myself:   EKG Interpretation  Date/Time:    Ventricular Rate:    PR Interval:    QRS Duration:   QT Interval:    QTC Calculation:   R Axis:     Text Interpretation:        Laboratory Studies: Results for orders placed or performed during the hospital encounter of 09/24/17 (from the past 24 hour(s))  CBC with Differential/Platelet     Status: Abnormal   Collection Time: 09/25/17 12:24 AM  Result Value Ref Range   WBC 5.4 4.0 - 10.5 K/uL   RBC 3.21 (L) 4.22 - 5.81 MIL/uL   Hemoglobin 10.4 (L) 13.0 - 17.0 g/dL   HCT 31.1 (L) 39.0 - 52.0 %   MCV 96.9 78.0 - 100.0 fL   MCH 32.4 26.0 -  34.0 pg   MCHC 33.4 30.0 - 36.0 g/dL   RDW 15.0 11.5 - 15.5 %   Platelets 122 (L) 150 - 400 K/uL   Neutrophils Relative % 81 %   Neutro Abs 4.3 1.7 - 7.7 K/uL   Lymphocytes Relative 10 %   Lymphs Abs 0.6 (L) 0.7 - 4.0 K/uL   Monocytes Relative 7 %   Monocytes Absolute 0.4 0.1 - 1.0 K/uL   Eosinophils Relative 2 %   Eosinophils Absolute 0.1 0.0 - 0.7 K/uL   Basophils Relative 0 %   Basophils Absolute 0.0 0.0 - 0.1 K/uL  Basic metabolic panel     Status: Abnormal   Collection Time: 09/25/17 12:24 AM  Result Value Ref Range   Sodium 139 135 - 145 mmol/L   Potassium 4.1 3.5 - 5.1 mmol/L   Chloride 102 101 - 111 mmol/L   CO2 29 22 - 32 mmol/L   Glucose, Bld 127 (H) 65 - 99 mg/dL   BUN 17 6 - 20 mg/dL   Creatinine, Ser 0.75 0.61 - 1.24 mg/dL   Calcium 10.1 8.9 - 10.3 mg/dL   GFR calc non Af Amer >60 >60 mL/min   GFR calc Af Amer >60 >60 mL/min   Anion gap 8 5 - 15   Imaging Studies: No results found.  ED COURSE  Nursing notes and initial vitals signs, including pulse oximetry, reviewed.  Vitals:   09/24/17 2301 09/25/17 0257  BP: (!) 125/52 (!) 117/45  Pulse: 82 72  Resp: 20 16  TempSrc: Oral   SpO2: 95% 96%   3:48 AM Extraction sockets packed with gauze saturated with lidocaine and epinephrine.  His sockets are now hemostatic and he is without pain.  He feels comfortable going home.  He was advised to be on a liquid diet for the next several days and to avoid swishing or do anything else which might risk dislodging the clots as we wish to prevent rebleeding or a dry socket.  He was advised to use teabags as previously instructed  should bleeding recur or return to the ED if bleeding is uncontrollable.  He advises his dentist's office is closed today.  PROCEDURES    ED DIAGNOSES     ICD-10-CM   1. Status post tooth extraction K08.409   2. Complication of procedure, initial encounter T81.9XXA        Sequoya Hogsett, Jenny Reichmann, MD 09/25/17 [phone]

## 2017-09-25 LAB — CBC WITH DIFFERENTIAL/PLATELET
Basophils Absolute: 0 10*3/uL (ref 0.0–0.1)
Basophils Relative: 0 %
Eosinophils Absolute: 0.1 10*3/uL (ref 0.0–0.7)
Eosinophils Relative: 2 %
HCT: 31.1 % — ABNORMAL LOW (ref 39.0–52.0)
Hemoglobin: 10.4 g/dL — ABNORMAL LOW (ref 13.0–17.0)
Lymphocytes Relative: 10 %
Lymphs Abs: 0.6 10*3/uL — ABNORMAL LOW (ref 0.7–4.0)
MCH: 32.4 pg (ref 26.0–34.0)
MCHC: 33.4 g/dL (ref 30.0–36.0)
MCV: 96.9 fL (ref 78.0–100.0)
Monocytes Absolute: 0.4 10*3/uL (ref 0.1–1.0)
Monocytes Relative: 7 %
Neutro Abs: 4.3 10*3/uL (ref 1.7–7.7)
Neutrophils Relative %: 81 %
Platelets: 122 10*3/uL — ABNORMAL LOW (ref 150–400)
RBC: 3.21 MIL/uL — ABNORMAL LOW (ref 4.22–5.81)
RDW: 15 % (ref 11.5–15.5)
WBC: 5.4 10*3/uL (ref 4.0–10.5)

## 2017-09-25 LAB — BASIC METABOLIC PANEL
ANION GAP: 8 (ref 5–15)
BUN: 17 mg/dL (ref 6–20)
CALCIUM: 10.1 mg/dL (ref 8.9–10.3)
CHLORIDE: 102 mmol/L (ref 101–111)
CO2: 29 mmol/L (ref 22–32)
Creatinine, Ser: 0.75 mg/dL (ref 0.61–1.24)
GFR calc non Af Amer: >60 mL/min (ref >60)
Glucose, Bld: 127 mg/dL — ABNORMAL HIGH (ref 65–99)
Potassium: 4.1 mmol/L (ref 3.5–5.1)
Sodium: 139 mmol/L (ref 135–145)

## 2017-09-25 MED ORDER — LIDOCAINE-EPINEPHRINE (PF) 2 %-1:200000 IJ SOLN: 20.0000 mL | Freq: Once | INTRAMUSCULAR | Status: DC

## 2017-09-30 ENCOUNTER — Encounter (HOSPITAL_COMMUNITY): Payer: Self-pay | Admitting: Emergency Medicine

## 2017-09-30 ENCOUNTER — Other Ambulatory Visit: Payer: Self-pay

## 2017-10-12 ENCOUNTER — Encounter (HOSPITAL_COMMUNITY): Payer: Self-pay | Admitting: *Deleted

## 2017-10-12 ENCOUNTER — Ambulatory Visit (HOSPITAL_COMMUNITY): Payer: Medicare Other | Admitting: Anesthesiology

## 2017-10-12 ENCOUNTER — Encounter (HOSPITAL_COMMUNITY)
Admission: RE | Disposition: A | Discharge status: Home / Self Care | Payer: Self-pay | Source: Ambulatory Visit | Attending: Gastroenterology

## 2017-10-12 ENCOUNTER — Ambulatory Visit (HOSPITAL_COMMUNITY)
Admission: RE | Admit: 2017-10-12 | Discharge: 2017-10-12 | Disposition: A | Discharge status: Home / Self Care | Payer: Medicare Other | Source: Ambulatory Visit | Attending: Gastroenterology | Admitting: Gastroenterology

## 2017-10-12 ENCOUNTER — Other Ambulatory Visit: Payer: Self-pay

## 2017-10-12 DIAGNOSIS — K219 Gastro-esophageal reflux disease without esophagitis: Secondary | ICD-10-CM | POA: Diagnosis not present

## 2017-10-12 DIAGNOSIS — Z9842 Cataract extraction status, left eye: Secondary | ICD-10-CM | POA: Insufficient documentation

## 2017-10-12 DIAGNOSIS — E785 Hyperlipidemia, unspecified: Secondary | ICD-10-CM | POA: Diagnosis not present

## 2017-10-12 DIAGNOSIS — Z9841 Cataract extraction status, right eye: Secondary | ICD-10-CM | POA: Insufficient documentation

## 2017-10-12 DIAGNOSIS — Z961 Presence of intraocular lens: Secondary | ICD-10-CM | POA: Insufficient documentation

## 2017-10-12 DIAGNOSIS — G2581 Restless legs syndrome: Secondary | ICD-10-CM | POA: Insufficient documentation

## 2017-10-12 DIAGNOSIS — I10 Essential (primary) hypertension: Secondary | ICD-10-CM | POA: Insufficient documentation

## 2017-10-12 DIAGNOSIS — H9190 Unspecified hearing loss, unspecified ear: Secondary | ICD-10-CM | POA: Insufficient documentation

## 2017-10-12 DIAGNOSIS — R011 Cardiac murmur, unspecified: Secondary | ICD-10-CM | POA: Diagnosis not present

## 2017-10-12 DIAGNOSIS — Z923 Personal history of irradiation: Secondary | ICD-10-CM | POA: Insufficient documentation

## 2017-10-12 DIAGNOSIS — G4733 Obstructive sleep apnea (adult) (pediatric): Secondary | ICD-10-CM | POA: Diagnosis not present

## 2017-10-12 DIAGNOSIS — K222 Esophageal obstruction: Secondary | ICD-10-CM

## 2017-10-12 DIAGNOSIS — Z8249 Family history of ischemic heart disease and other diseases of the circulatory system: Secondary | ICD-10-CM | POA: Insufficient documentation

## 2017-10-12 DIAGNOSIS — Z825 Family history of asthma and other chronic lower respiratory diseases: Secondary | ICD-10-CM | POA: Insufficient documentation

## 2017-10-12 DIAGNOSIS — E039 Hypothyroidism, unspecified: Secondary | ICD-10-CM | POA: Insufficient documentation

## 2017-10-12 DIAGNOSIS — I6523 Occlusion and stenosis of bilateral carotid arteries: Secondary | ICD-10-CM | POA: Diagnosis not present

## 2017-10-12 DIAGNOSIS — K589 Irritable bowel syndrome without diarrhea: Secondary | ICD-10-CM | POA: Insufficient documentation

## 2017-10-12 DIAGNOSIS — Z7901 Long term (current) use of anticoagulants: Secondary | ICD-10-CM | POA: Diagnosis not present

## 2017-10-12 DIAGNOSIS — J439 Emphysema, unspecified: Secondary | ICD-10-CM | POA: Diagnosis not present

## 2017-10-12 DIAGNOSIS — Z87891 Personal history of nicotine dependence: Secondary | ICD-10-CM | POA: Insufficient documentation

## 2017-10-12 DIAGNOSIS — R1310 Dysphagia, unspecified: Secondary | ICD-10-CM | POA: Diagnosis not present

## 2017-10-12 DIAGNOSIS — I739 Peripheral vascular disease, unspecified: Secondary | ICD-10-CM | POA: Insufficient documentation

## 2017-10-12 DIAGNOSIS — F0390 Unspecified dementia without behavioral disturbance: Secondary | ICD-10-CM | POA: Insufficient documentation

## 2017-10-12 DIAGNOSIS — I714 Abdominal aortic aneurysm, without rupture: Secondary | ICD-10-CM | POA: Diagnosis not present

## 2017-10-12 DIAGNOSIS — Q396 Congenital diverticulum of esophagus: Secondary | ICD-10-CM | POA: Insufficient documentation

## 2017-10-12 DIAGNOSIS — R1319 Other dysphagia: Secondary | ICD-10-CM

## 2017-10-12 DIAGNOSIS — Z8546 Personal history of malignant neoplasm of prostate: Secondary | ICD-10-CM | POA: Diagnosis not present

## 2017-10-12 DIAGNOSIS — K449 Diaphragmatic hernia without obstruction or gangrene: Secondary | ICD-10-CM | POA: Diagnosis not present

## 2017-10-12 DIAGNOSIS — Z823 Family history of stroke: Secondary | ICD-10-CM | POA: Insufficient documentation

## 2017-10-12 DIAGNOSIS — I440 Atrioventricular block, first degree: Secondary | ICD-10-CM | POA: Diagnosis not present

## 2017-10-12 HISTORY — PX: ESOPHAGOGASTRODUODENOSCOPY: SHX5428

## 2017-10-12 HISTORY — PX: ESOPHAGEAL MANOMETRY: SHX5429

## 2017-10-12 HISTORY — PX: SAVORY DILATION: SHX5439

## 2017-10-12 SURGERY — EGD (ESOPHAGOGASTRODUODENOSCOPY)
Anesthesia: General

## 2017-10-12 SURGERY — MANOMETRY, ESOPHAGUS

## 2017-10-12 MED ORDER — PROPOFOL 10 MG/ML IV BOLUS
INTRAVENOUS | Status: DC | PRN
  Administered 2017-10-12: 100 mg via INTRAVENOUS
  Administered 2017-10-12: 20 mg via INTRAVENOUS

## 2017-10-12 MED ORDER — EPHEDRINE SULFATE 50 MG/ML IJ SOLN
INTRAMUSCULAR | Status: DC | PRN
  Administered 2017-10-12: 5 mg via INTRAVENOUS

## 2017-10-12 MED ORDER — SUCCINYLCHOLINE CHLORIDE 20 MG/ML IJ SOLN
INTRAMUSCULAR | Status: DC | PRN
  Administered 2017-10-12: 100 mg via INTRAVENOUS

## 2017-10-12 MED ORDER — LIDOCAINE HCL (CARDIAC) 20 MG/ML IV SOLN
INTRAVENOUS | Status: DC | PRN
  Administered 2017-10-12: 50 mg via INTRAVENOUS

## 2017-10-12 MED ORDER — FENTANYL CITRATE (PF) 100 MCG/2ML IJ SOLN
INTRAMUSCULAR | Status: AC
  Filled 2017-10-12: qty 2

## 2017-10-12 MED ORDER — ONDANSETRON HCL 4 MG/2ML IJ SOLN
INTRAMUSCULAR | Status: DC | PRN
  Administered 2017-10-12: 4 mg via INTRAVENOUS

## 2017-10-12 MED ORDER — SODIUM CHLORIDE 0.9 % IV SOLN: INTRAVENOUS | Status: DC

## 2017-10-12 MED ORDER — LACTATED RINGERS IV SOLN
INTRAVENOUS | Status: DC
  Administered 2017-10-12: 1000 mL via INTRAVENOUS

## 2017-10-12 MED ORDER — FENTANYL CITRATE (PF) 100 MCG/2ML IJ SOLN
INTRAMUSCULAR | Status: DC | PRN
  Administered 2017-10-12: 50 ug via INTRAVENOUS

## 2017-10-12 MED ORDER — PROPOFOL 10 MG/ML IV BOLUS
INTRAVENOUS | Status: AC
  Filled 2017-10-12: qty 20

## 2017-10-12 SURGICAL SUPPLY — 2 items
FACESHIELD LNG OPTICON STERILE (SAFETY) IMPLANT
GLOVE BIO SURGEON STRL SZ8 (GLOVE) ×6 IMPLANT

## 2017-10-12 NOTE — Transfer of Care (Signed)
Immediate Anesthesia Transfer of Care Note  Patient: Robert Schaefer  Procedure(s) Performed: ESOPHAGOGASTRODUODENOSCOPY (EGD) (N/A ) SAVORY DILATION (N/A )  Patient Location: PACU  Anesthesia Type:General  Level of Consciousness: drowsy, patient cooperative and responds to stimulation  Airway & Oxygen Therapy: Patient Spontanous Breathing and Patient connected to face mask oxygen  Post-op Assessment: Report given to RN and Post -op Vital signs reviewed and stable  Post vital signs: Reviewed and stable  Last Vitals:  Vitals Value Taken Time  BP    Temp    Pulse 71 10/12/2017  8:22 AM  Resp 16 10/12/2017  8:22 AM  SpO2 100 % 10/12/2017  8:22 AM  Vitals shown include unvalidated device data.  Last Pain:  Vitals:   10/12/17 0644  TempSrc: Oral  PainSc: 0-No pain         Complications: No apparent anesthesia complications

## 2017-10-12 NOTE — Anesthesia Preprocedure Evaluation (Addendum)
Anesthesia Evaluation  Patient identified by MRN, date of birth, ID band Patient awake    Reviewed: Allergy & Precautions, NPO status , Patient's Chart, lab work & pertinent test results  Airway Mallampati: II  TM Distance: >3 FB Neck ROM: Full    Dental  (+) Loose, Poor Dentition,    Pulmonary former smoker,    breath sounds clear to auscultation       Cardiovascular hypertension,  Rhythm:Regular Rate:Normal + Systolic murmurs    Neuro/Psych    GI/Hepatic   Endo/Other    Renal/GU      Musculoskeletal   Abdominal   Peds  Hematology   Anesthesia Other Findings   Reproductive/Obstetrics                             Anesthesia Physical Anesthesia Plan  ASA: III  Anesthesia Plan: General   Post-op Pain Management:    Induction: Intravenous  PONV Risk Score and Plan: Ondansetron  Airway Management Planned: Oral ETT  Additional Equipment:   Intra-op Plan:   Post-operative Plan: Extubation in OR  Informed Consent: I have reviewed the patients History and Physical, chart, labs and discussed the procedure including the risks, benefits and alternatives for the proposed anesthesia with the patient or authorized representative who has indicated his/her understanding and acceptance.   Dental advisory given  Plan Discussed with: CRNA  Anesthesia Plan Comments:         Anesthesia Quick Evaluation

## 2017-10-12 NOTE — Anesthesia Postprocedure Evaluation (Signed)
Anesthesia Post Note  Patient: Robert Schaefer  Procedure(s) Performed: ESOPHAGOGASTRODUODENOSCOPY (EGD) (N/A ) SAVORY DILATION (N/A )     Patient location during evaluation: PACU Anesthesia Type: General Level of consciousness: awake and alert Pain management: pain level controlled Vital Signs Assessment: post-procedure vital signs reviewed and stable Respiratory status: spontaneous breathing, nonlabored ventilation, respiratory function stable and patient connected to nasal cannula oxygen Cardiovascular status: blood pressure returned to baseline and stable Postop Assessment: no apparent nausea or vomiting Anesthetic complications: no    Last Vitals:  Vitals:   10/12/17 0843 10/12/17 0850  BP: (!) 170/35 (!) 154/35  Pulse: 61 63  Resp: 18 16  Temp:    SpO2: 100% 100%    Last Pain:  Vitals:   10/12/17 0850  TempSrc:   PainSc: 0-No pain                 Mickey Hebel COKER

## 2017-10-12 NOTE — Discharge Instructions (Signed)
YOU HAD AN ENDOSCOPIC PROCEDURE TODAY: Refer to the procedure report and other information in the discharge instructions given to you for any specific questions about what was found during the examination. If this information does not answer your questions, please call [place] office at [phone] to clarify.   YOU SHOULD EXPECT: Some feelings of bloating in the abdomen. Passage of more gas than usual. Walking can help get rid of the air that was put into your GI tract during the procedure and reduce the bloating. If you had a lower endoscopy (such as a colonoscopy or flexible sigmoidoscopy) you may notice spotting of blood in your stool or on the toilet paper. Some abdominal soreness may be present for a day or two, also.  DIET: Your first meal following the procedure should be a light meal and then it is ok to progress to your normal diet. A half-sandwich or bowl of soup is an example of a good first meal. Heavy or fried foods are harder to digest and may make you feel nauseous or bloated. Drink plenty of fluids but you should avoid alcoholic beverages for 24 hours. If you had a esophageal dilation, please see attached instructions for diet.    ACTIVITY: Your care partner should take you home directly after the procedure. You should plan to take it easy, moving slowly for the rest of the day. You can resume normal activity the day after the procedure however YOU SHOULD NOT DRIVE, use power tools, machinery or perform tasks that involve climbing or major physical exertion for 24 hours (because of the sedation medicines used during the test).   SYMPTOMS TO REPORT IMMEDIATELY: A gastroenterologist can be reached at any hour. Please call [phone]  for any of the following symptoms:   Following upper endoscopy (EGD, EUS, ERCP, esophageal dilation) Vomiting of blood or coffee ground material  New, significant abdominal pain  New, significant chest pain or pain under the shoulder blades  Painful or  persistently difficult swallowing  New shortness of breath  Black, tarry-looking or red, bloody stools  FOLLOW UP:  If any biopsies were taken you will be contacted by phone or by letter within the next 1-3 weeks. Call [phone]  if you have not heard about the biopsies in 3 weeks.  Please also call with any specific questions about appointments or follow up tests.  

## 2017-10-12 NOTE — H&P (Signed)
HPI:   Robert Schaefer is a 77 y.o. male with severe GEJ stricture associated with large distal esophageal diverticulum and persistent dysphagia despite dilation. Concern for possible achalasia. Here for repeat EGD with dilation of GEJ stricture and endoscopic placement of manometry catheter given manometry catheter is unlikely to be placed successfully without endoscopic assisting this given large diverticulum and altered anatomy. Patient states he continues to have dysphagia despite the last dilation.   Past Medical History:  Diagnosis Date  . AAA (abdominal aortic aneurysm) ([place])    found by berry at periphral vascular catherization w/ stenting 05-29-2016--- duplex 06-09-2016 distal AAA measures 3.0cm x 3.1cm  . Anticoagulant long-term use    plavix  . Carotid stenosis, asymptomatic, bilateral    per duplex --- bilateral <50% ICA  . Cataract    removed  . COPD with emphysema [facility])    pulmologist-  dr [name]  . Dyspnea on exertion   . First degree heart block   . GERD (gastroesophageal reflux disease)   . Heart murmur   . Hiatal hernia   . HOH (hard of hearing)   . Hyperlipidemia    no meds normal presently  . Hypertension    pt stopped taking losartan apprx. April 2018 , per pt pcp instructed him to stop  . Hypothyroidism   . IBS (irritable bowel syndrome)    intermittant constipation  . Lesion of left lung    followed by dr [name] (pulmologist)  post infection scarring  . Lower urinary tract symptoms (LUTS)   . Mild dementia   . Moderate aortic stenosis    per last echo --  valve area 1.1cm^2 ,    . OSA (obstructive sleep apnea)    has a cpap-  intolerant  . Peripheral vascular disease [facility])    cardiologist-- dr berry-- per last ABI/Aorta/Iliac 06-09-2016  patent bilateral iliac kissing stents, >50% stenosis bilateral iliac artery (R>L) and left external iliac artery, patent IVC  . Prostate cancer [facility]) urologist-  dr [name]  oncologist-  dr [name]   dx 03/  2018-- Stage T1c, Gleason 4+5, PSA 8.15, vol 36cc--- currently ADT and planned external radiation  . RLS (restless legs syndrome)   . Schatzki's ring   . Sleep apnea    has CPAP but doesn't use due to Clostaphobia  . Wears glasses   . Wears partial dentures    upper    Past Surgical History:  Procedure Laterality Date  . CARDIOVASCULAR STRESS TEST  04-23-2016   dr berry   normal nuclear study w/ no ischemia/  normal LV function and wall function, nuclear stress ef 57%  . CATARACT EXTRACTION W/ INTRAOCULAR LENS  IMPLANT, BILATERAL Bilateral   . COLONOSCOPY    . ESOPHAGOGASTRODUODENOSCOPY (EGD) WITH ESOPHAGEAL DILATION  last one 11-09-2015  dr Yolanda Bonine   dilation schatzki's ring  . GOLD SEED IMPLANT N/A 04/29/2017   Procedure: GOLD SEED IMPLANT;  Surgeon: [name], MD;  Location: [facility];  Service: Urology;  Laterality: N/A;  . HERNIA REPAIR    . INGUINAL HERNIA REPAIR Right 02/23/2014   Procedure: HERNIA REPAIR INGUINAL ADULT;  Surgeon: Joyice Faster. Cornett, MD;  Location: [place];  Service: General;  Laterality: Right;  . INSERTION OF MESH Right 02/23/2014   Procedure: INSERTION OF MESH;  Surgeon: Joyice Faster. Cornett, MD;  Location: [place];  Service: General;  Laterality: Right;  . PERIPHERAL  VASCULAR CATHETERIZATION N/A 05/29/2016   Procedure: Lower Extremity Angiography;  Surgeon: [name], MD;  Location: [place] CV LAB;  Service: Cardiovascular;  Laterality: N/A; Diamondback orbital rotational atherectomy, PTA and stenting bilateral common iliac arteries   . PILONIDAL CYST / SINUS EXCISION  2000  . PROSTATE BIOPSY  09-22-2016  dr [name] office  . SPACE OAR INSTILLATION N/A 04/29/2017   Procedure: SPACE OAR INSTILLATION;  Surgeon: [name], MD;  Location: [facility];  Service: Urology;  Laterality: N/A;  . TRANSTHORACIC ECHOCARDIOGRAM  05-05-2016   dr berry   ef 37-34%,  grade 1diastolic  dysfunction/  moderate AV stenosis with mod. AR (right coronary and noncoronary cusp mobility seversly restricted);  valve area 1.25cm^2, mean gradient 27 mmHg, peak gradient 56mmHg/  mild MR  . TREATMENT FISTULA ANAL  2010  . UPPER GASTROINTESTINAL ENDOSCOPY      Family History  Problem Relation Age of Onset  . Heart attack Mother   . Stroke Father   . Asthma Father   . Cancer Neg Hx   . Colon cancer Neg Hx   . Stomach cancer Neg Hx   . Esophageal cancer Neg Hx   . Rectal cancer Neg Hx   . Pancreatic cancer Neg Hx   . Prostate cancer Neg Hx      Social History   Tobacco Use  . Smoking status: Former Smoker    Packs/day: 1.00    Years: 50.00    Pack years: 50.00    Types: Cigarettes    Last attempt to quit: 07/05/2005    Years since quitting: 12.2  . Smokeless tobacco: Never Used  Substance Use Topics  . Alcohol use: No  . Drug use: No    Prior to Admission medications   Medication Sig Start Date End Date Taking? Authorizing Provider  alfuzosin (UROXATRAL) 10 MG 24 hr tablet Take 1 tablet (10 mg total) by mouth daily with breakfast. 05/20/17  Yes Kyung Rudd, MD  aspirin EC 81 MG tablet Take 81 mg by mouth daily.   Yes [provider]  Cholecalciferol (VITAMIN D3) 1000 units CAPS Take 7,000 Units by mouth daily.    Yes [provider]  clopidogrel (PLAVIX) 75 MG tablet Take 75 mg daily by mouth.   Yes [provider]  fluticasone (FLONASE) 50 MCG/ACT nasal spray Place 2 sprays into both nostrils daily. Patient taking differently: Place 2 sprays into both nostrils daily as needed for allergies.  09/15/16  Yes [name], MD  HYDROcodone-acetaminophen (NORCO/VICODIN) 5-325 MG tablet Take 1 tablet by mouth every 4 (four) hours as needed for moderate pain. 09/24/17  Yes [provider]  levothyroxine (SYNTHROID, LEVOTHROID) 50 MCG tablet Take 1 tablet (50 mcg total) by mouth daily. 12/22/16  Yes [name], MD  Menthol, Topical  Analgesic, (BLUE-EMU MAXIMUM STRENGTH EX) Apply 1 application topically 3 (three) times daily as needed (for pain).   Yes [provider]  omeprazole (PRILOSEC) 40 MG capsule Take 40 mg by mouth daily.   Yes [provider]    Current Facility-Administered Medications  Medication Dose Route Frequency Provider Last Rate Last Dose  . 0.9 %  sodium chloride infusion   Intravenous Continuous Azazel Franze, Carlota Raspberry, MD      . lactated ringers infusion   Intravenous Continuous Havery Moros Carlota Raspberry, MD 10 mL/hr at 10/12/17 0703 1,000 mL at 10/12/17 0703    Allergies as of 09/08/2017 - Review Complete 08/18/2017  Allergen Reaction Noted  .  Penicillins Anaphylaxis, Itching, and Swelling 07/04/2013  . Sulfa antibiotics Anaphylaxis and Swelling 07/04/2013  . Versed [midazolam] Other (See Comments) 05/31/2016     Review of Systems:    As per HPI, otherwise negative    Physical Exam:  Vital signs in last 24 hours: Temp:  [97.8 F (36.6 C)] 97.8 F (36.6 C) (04/15 0644) Pulse Rate:  [71] 71 (04/15 0644) Resp:  [20] 20 (04/15 0644) BP: (156)/(39) 156/39 (04/15 0644) SpO2:  [97 %] 97 % (04/15 0644) Weight:  [138 lb (62.6 kg)] 138 lb (62.6 kg) (04/15 6834)   General:  Pleasant male in NAD Lungs:  Respirations even and unlabored. Lungs clear to auscultation bilaterally.   No wheezes, crackles, or rhonchi.  Heart:  Regular rate and rhythm; no MRG Abdomen:  Soft, nondistended, nontender. Normal bowel sounds. No appreciable masses or hepatomegaly.  Rectal:  Not performed.  Msk:  Symmetrical without gross deformities.  Extremities:  Without edema. Neurologic:  Alert and  oriented x4;  grossly normal neurologically. Skin:  Intact without significant lesions or rashes. Psych:  Alert and cooperative. Normal affect.  Lab Results  Component Value Date   WBC 5.4 09/25/2017   HGB 10.4 (L) 09/25/2017   HCT 31.1 (L) 09/25/2017   MCV 96.9 09/25/2017   PLT 122 (L) 09/25/2017     Lab Results  Component Value Date   CREATININE 0.75 09/25/2017   BUN 17 09/25/2017   NA 139 09/25/2017   K 4.1 09/25/2017   CL 102 09/25/2017   CO2 29 09/25/2017    Lab Results  Component Value Date   ALT 15 (L) 04/27/2017   AST 19 04/27/2017   ALKPHOS 61 04/27/2017   BILITOT 0.9 04/27/2017      Impression / Plan:    77 y/o male with persistent dysphagia despite dilation of GEJ stricture, concern for possible underlying motility disorder, rule out achalasia. Here for EGD with dilation of GEJ stricture and endoscopic placement of manometry catheter. Patient is going to be intubated prophylactically given aspiration risk and retained food noted in the diverticulum on prior endoscopies. I have discussed risks / benefits with him and he wishes to proceed. Off plavix  Pinedale Cellar, MD [facility]

## 2017-10-12 NOTE — Progress Notes (Signed)
Manometry probe placed during EGD per Dr. Havery Moros. Esophageal Manometry done per protocol.  PT tolerated well. Probe removed when study over without difficulty. Pt tolerated well. Dr. Silverio Decamp to read report.

## 2017-10-12 NOTE — Anesthesia Procedure Notes (Signed)
Procedure Name: Intubation Date/Time: 10/12/2017 7:42 AM Performed by: Glory Buff, CRNA Pre-anesthesia Checklist: Patient identified, Emergency Drugs available, Suction available and Patient being monitored Patient Re-evaluated:Patient Re-evaluated prior to induction Oxygen Delivery Method: Circle system utilized Preoxygenation: Pre-oxygenation with 100% oxygen Induction Type: IV induction Ventilation: Mask ventilation without difficulty Laryngoscope Size: Miller and 3 Grade View: Grade I Tube type: Oral Tube size: 7.5 mm Number of attempts: 1 Airway Equipment and Method: Stylet and Oral airway Placement Confirmation: ETT inserted through vocal cords under direct vision,  positive ETCO2 and breath sounds checked- equal and bilateral Secured at: 21 cm Tube secured with: Tape Dental Injury: Teeth and Oropharynx as per pre-operative assessment

## 2017-10-12 NOTE — Op Note (Signed)
[facility] Patient Name: Robert Schaefer Procedure Date: 10/12/2017 MRN: [id number] Attending MD: Carlota Raspberry. Prescious Hurless MD, MD Date of Birth: 09-09-1940 CSN: [id number] Age: 77 Admit Type: Inpatient Procedure:                Upper GI endoscopy Indications:              Dysphagia - severe GEJ stricture, large distal                            esophageal diverticulum, in need of manometry to                            rule out achalasia. Patient was prophylactically                            intubated for this procedure given aspiration risk Providers:                Remo Lipps P. Jonas Goh MD, MD, Elmer Ramp. Tilden Dome, RN,                            Alan Mulder, Technician Referring MD:              Medicines:                Monitored Anesthesia Care Complications:            No immediate complications. Estimated blood loss:                            Minimal. Estimated Blood Loss:     Estimated blood loss was minimal. Procedure:                Pre-Anesthesia Assessment:                           - Prior to the procedure, a History and Physical                            was performed, and patient medications and                            allergies were reviewed. The patient's tolerance of                            previous anesthesia was also reviewed. The risks                            and benefits of the procedure and the sedation                            options and risks were discussed with the patient.                            All questions were answered, and informed consent  was obtained. Prior Anticoagulants: The patient has                            taken Plavix (clopidogrel), last dose was 6 days                            prior to procedure. ASA Grade Assessment: III - A                            patient with severe systemic disease. After                            reviewing the risks and benefits, the patient was               deemed in satisfactory condition to undergo the                            procedure.                           After obtaining informed consent, the endoscope was                            passed under direct vision. Throughout the                            procedure, the patient's blood pressure, pulse, and                            oxygen saturations were monitored continuously. The                            EG-[id number] ([id number]) scope was introduced through the                            mouth, and advanced to the body of the stomach. The                            upper GI endoscopy was accomplished without                            difficulty. The patient tolerated the procedure                            well. Scope In: Scope Out: Findings:      Esophagogastric landmarks were identified: the Z-line was found at 39       cm, the gastroesophageal junction was found at 39 cm and the upper       extent of the gastric folds was found at 42 cm from the incisors.      A 3 cm hiatal hernia was present.      One benign-appearing, intrinsic moderate stenosis was found. This       stenosis measured less than one cm (in length). A TTS dilator was passed  through the scope. Dilation with a 12-13.5-15 mm balloon and a then a       15-16.5-18 mm balloon dilator was performed to 12 mm, 13.5 mm, 15 mm,       16.5 mm and 18 mm after which an appropriate mucosal wrent was       appreciated.      A diverticulum with a large opening was found in the lower third of the       esophagus with some residual secretions / residual food. This was       suctioned as much as possible and Jabier Mutton net placed to retrieve food       fragments.      The exam of the esophagus was otherwise normal. An esophageal manometry       catheter was then placed through the right nare and into the esophagus.       It was guided down the esophagus with the upper endoscope through the       stricture and into  the stomach.      The cardia and gastric fundus were normal. Full exam of stomach and       duodenum not performed given this has recently been evaluated Impression:               - Esophagogastric landmarks identified.                           - 3 cm hiatal hernia.                           - Benign-appearing esophageal stenosis. Dilated to                            47m with good result.                           - Large diverticulum in the lower third of the                            esophagus.                           - Esophageal manometry catheter placed with                            endoscopic assistance.                           - Normal cardia and gastric fundus. Moderate Sedation:      No moderate sedation, case performed with MAC Recommendation:           - Patient has a contact number available for                            emergencies. The signs and symptoms of potential                            delayed complications were discussed with the  patient. Return to normal activities tomorrow.                            Written discharge instructions were provided to the                            patient.                           - Esophageal manometry study once the patient has                            recovered from anesthesia                           - Soft diet.                           - Continue present medications.                           - Resume Plavix tomorrow night                           - Further recommendations pending results of                            manometry and course following dilation to 27m Procedure Code(s):        --- Professional ---                           [id number], Esophagogastroduodenoscopy, flexible,                            transoral; with transendoscopic balloon dilation of                            esophagus (less than 30 mm diameter) Diagnosis Code(s):        --- Professional ---                            K44.9, Diaphragmatic hernia without obstruction or                            gangrene                           K22.2, Esophageal obstruction                           Q39.6, Congenital diverticulum of esophagus                           R13.10, Dysphagia, unspecified CPT copyright 2017 American Medical Association. All rights reserved. The codes documented in this report are preliminary and upon coder review may  be revised to meet current compliance requirements. SRemo LippsP. Nathon Stefanski MD, MD 10/12/2017 8:16:58 AM This report has been  signed electronically. Number of Addenda: 0

## 2017-10-12 NOTE — Interval H&P Note (Signed)
History and Physical Interval Note: Last dose of plavix was 6 days ago  10/12/2017 7:30 AM  Robert Schaefer  has presented today for surgery, with the diagnosis of dysphagia, stricture at [facility], large diverticulum at [facility]  The various methods of treatment have been discussed with the patient and family. After consideration of risks, benefits and other options for treatment, the patient has consented to  Procedure(s): ESOPHAGOGASTRODUODENOSCOPY (EGD) (N/A) SAVORY DILATION (N/A) as a surgical intervention . Manometry placement. The patient's history has been reviewed, patient examined, no change in status, stable for surgery.  I have reviewed the patient's chart and labs.  Questions were answered to the patient's satisfaction.     [place]

## 2017-10-15 DIAGNOSIS — R1319 Other dysphagia: Secondary | ICD-10-CM

## 2017-10-15 DIAGNOSIS — R1310 Dysphagia, unspecified: Secondary | ICD-10-CM

## 2017-10-21 ENCOUNTER — Telehealth: Payer: Self-pay

## 2017-10-21 NOTE — Telephone Encounter (Signed)
Referral faxed to [facility] 

## 2017-10-27 ENCOUNTER — Telehealth: Payer: Self-pay | Admitting: *Deleted

## 2017-10-27 NOTE — Telephone Encounter (Addendum)
Routed to Dr. [name] for advice regarding holding plavix for extraction of 3-4 teeth - had bleeding complication after last tooth extraction.

## 2017-10-27 NOTE — Telephone Encounter (Signed)
   [facility] Pre-operative Risk Assessment    Request for surgical clearance:  1. What type of surgery is being performed? 3-4 teeth extractions   2. When is this surgery scheduled? 10/29/2017  3. What type of clearance is required (medical clearance vs. Pharmacy clearance to hold med vs. Both)? Pharmacy  4. Are there any medications that need to be held prior to surgery and how long?PLAVIX  SEVERE BLEEDING WITH PREVIOUS EXTRACTIONS HAD TO GO TO ED FOR TX  5. Practice name and name of physician performing surgery? [facility]  6. What is your office phone number [phone]     7.   What is your office fax number [phone]  8.   Anesthesia type (None, local, MAC, general) ?    Robert Schaefer 10/27/2017, 9:13 AM  _________________________________________________________________   (provider comments below)

## 2017-10-28 NOTE — Telephone Encounter (Signed)
   I will route this recommendation to the requesting party via Epic fax function and remove from pre-op pool.  Please call with questions.  [place], [place] 10/28/2017, 3:56 PM

## 2017-10-28 NOTE — Telephone Encounter (Signed)
Okay to interrupt Plavix if necessary

## 2017-11-05 NOTE — Telephone Encounter (Signed)
Pt has appt with Dr. Brantley Stage as [facility] on 11-16-17.  Pt informed.

## 2017-11-10 DIAGNOSIS — C61 Malignant neoplasm of prostate: Secondary | ICD-10-CM | POA: Diagnosis not present

## 2017-11-17 DIAGNOSIS — Z5111 Encounter for antineoplastic chemotherapy: Secondary | ICD-10-CM | POA: Diagnosis not present

## 2017-11-17 DIAGNOSIS — C61 Malignant neoplasm of prostate: Secondary | ICD-10-CM | POA: Diagnosis not present

## 2017-11-17 DIAGNOSIS — R351 Nocturia: Secondary | ICD-10-CM | POA: Diagnosis not present

## 2017-11-30 DIAGNOSIS — K225 Diverticulum of esophagus, acquired: Secondary | ICD-10-CM | POA: Diagnosis not present

## 2017-11-30 DIAGNOSIS — K220 Achalasia of cardia: Secondary | ICD-10-CM | POA: Diagnosis not present

## 2017-12-03 ENCOUNTER — Telehealth: Payer: Self-pay

## 2017-12-03 NOTE — Telephone Encounter (Signed)
   [facility] Pre-operative Risk Assessment    Request for surgical clearance:  1. What type of surgery is being performed? Lap Nissen Fundoplication  2. When is this surgery scheduled? TBD  3. What type of clearance is required (medical clearance vs. Pharmacy clearance to hold med vs. Both)? Both  4. Are there any medications that need to be held prior to surgery and how long? Plavix and Aspirin  5. Practice name and name of physician performing surgery? [facility]  6. What is your office phone number [phone]   7.   What is your office fax number [phone]  8.   Anesthesia type (None, local, MAC, general) ? General   Robert Schaefer Robert Schaefer 12/03/2017, 5:04 PM  _________________________________________________________________   (provider comments below)

## 2017-12-04 NOTE — Telephone Encounter (Signed)
Spoke with pt, aware he will need an appointment for clearance. He no longer drives so he will have his daughter call back to make the appointment.

## 2017-12-04 NOTE — Telephone Encounter (Signed)
   Primary Cardiologist: Dr [name]  Chart reviewed as part of pre-operative protocol coverage. Because of Azariah Kramp's past medical history and time since last visit, he/she will require a follow-up visit in order to better assess preoperative cardiovascular risk.  Pre-op covering staff: - Please schedule appointment and call patient to inform them. - Please contact requesting surgeon's office via preferred method (i.e, phone, fax) to inform them of need for appointment prior to surgery.  Kerin Ransom, PA-C  12/04/2017, 10:51 AM

## 2017-12-08 NOTE — Telephone Encounter (Signed)
LMOVM  OF  [facility] TO CALL  CLINIC BACK AND MAKE APPT FOR FATHER CARDIAC CLEARANCE.

## 2017-12-09 NOTE — Telephone Encounter (Signed)
Spoke with pt re: scheduling an appt for his surgical clearance Pt stated that his daughter handles all of his stuff and he will have her call back.

## 2017-12-09 NOTE — Telephone Encounter (Signed)
Pt has been scheduled to see [name], PA-C 12/30/17.

## 2017-12-30 ENCOUNTER — Ambulatory Visit (INDEPENDENT_AMBULATORY_CARE_PROVIDER_SITE_OTHER): Payer: Medicare Other | Admitting: Physician Assistant

## 2017-12-30 ENCOUNTER — Encounter: Payer: Self-pay | Admitting: Physician Assistant

## 2017-12-30 VITALS — BP 134/62 | HR 71 | Ht 65.0 in | Wt 135.0 lb

## 2017-12-30 DIAGNOSIS — I739 Peripheral vascular disease, unspecified: Secondary | ICD-10-CM | POA: Diagnosis not present

## 2017-12-30 DIAGNOSIS — Z01810 Encounter for preprocedural cardiovascular examination: Secondary | ICD-10-CM | POA: Diagnosis not present

## 2017-12-30 DIAGNOSIS — I350 Nonrheumatic aortic (valve) stenosis: Secondary | ICD-10-CM

## 2017-12-30 NOTE — Patient Instructions (Signed)
Medication Instructions:  Your physician recommends that you continue on your current medications as directed. Please refer to the Current Medication list given to you today.  Testing/Procedures: Your physician has requested that you have an echocardiogram. Echocardiography is a painless test that uses sound waves to create images of your heart. It provides your doctor with information about the size and shape of your heart and how well your heart's chambers and valves are working. This procedure takes approximately one hour. There are no restrictions for this procedure.  This will be done at our [facility] location:  [place]: After echo with Dr. [name] or [name] PA (on a day Dr. [name] in office).    Any Other Special Instructions Will Be Listed Below (If Applicable).     If you need a refill on your cardiac medications before your next appointment, please call your pharmacy.

## 2017-12-30 NOTE — Progress Notes (Signed)
Cardiology Office Note   Date:  12/30/2017   ID:  Robert Schaefer, DOB 1940/08/06, MRN [id number]  PCP:  Robert Pinto, MD  Cardiologist: Dr. Gwenlyn Schaefer, 09/16/2016 Robert Ferries, PA-C   No chief complaint on file.   History of Present Illness: Robert Schaefer is a 77 y.o. male with a history of small AAA, <50% carotid dz, COPD, GERD, HLD, HTN, IBS, hypothyroid, PAD w/ bilat iliac kissing stents, greater than 50% bilateral iliac disease 04/2017 ultrasound, nl MV 2017  6/6 phone notes regarding surgical clearance for Lap Nissen fundoplication  [name] presents for cardiology evaluation.  He does not walk much. Says he just gets tired, has no energy.   He is also limited by his knees.  He gets joint pain if he walks too far.  He has trouble going up 3 steps to his home, not sure why.   He gets chest pain, R and L. Cannot say how often it happens. Lasts 2-3 seconds. Relieved by certain movements. No other chest pain.   He has regular stomach pain.  Says he can live with the GI issues the way they are now if he has to, but would like to get the surgery done.  Sometimes he wakes in the night w/ SOB, per wife. No LE edema.  No orthopnea.  Dyspnea on exertion is significant, no recent change.  Gets claudication symptoms if he walks too far.  He believes he can walk a block but he has not done so recently.  Also feels that he gives out, so not clear what limits him more.     Past Medical History:  Diagnosis Date  . AAA (abdominal aortic aneurysm) ([name])    Schaefer by berry at periphral vascular catherization w/ stenting 05-29-2016--- duplex 06-09-2016 distal AAA measures 3.0cm x 3.1cm  . Anticoagulant long-term use    plavix  . Carotid stenosis, asymptomatic, bilateral    per duplex --- bilateral <50% ICA  . Cataract    removed  . COPD with emphysema [facility])    pulmologist-  dr [name]  . Dyspnea on exertion   . First degree heart block   . GERD (gastroesophageal reflux disease)     . Heart murmur   . Hiatal hernia   . HOH (hard of hearing)   . Hyperlipidemia    no meds normal presently  . Hypertension    pt stopped taking losartan apprx. April 2018 , per pt pcp instructed him to stop  . Hypothyroidism   . IBS (irritable bowel syndrome)    intermittant constipation  . Lesion of left lung    followed by dr [name] (pulmologist)  post infection scarring  . Lower urinary tract symptoms (LUTS)   . Mild dementia   . Moderate aortic stenosis    per last echo --  valve area 1.1cm^2 ,    . OSA (obstructive sleep apnea)    has a cpap-  intolerant  . Peripheral vascular disease [name])    cardiologist-- dr berry-- per last ABI/Aorta/Iliac 06-09-2016  patent bilateral iliac kissing stents, >50% stenosis bilateral iliac artery (R>L) and left external iliac artery, patent IVC  . Prostate cancer [facility]) urologist-  dr [name]  oncologist-  dr [name]   dx 03/ 2018-- Stage T1c, Gleason 4+5, PSA 8.15, vol 36cc--- currently ADT and planned external radiation  . RLS (restless legs syndrome)   . Schatzki's ring   . Sleep apnea    has CPAP but doesn't use due to Claustaphobia  .  Wears glasses   . Wears partial dentures    upper    Past Surgical History:  Procedure Laterality Date  . CARDIOVASCULAR STRESS TEST  04-23-2016   dr berry   normal nuclear study w/ no ischemia/  normal LV function and wall function, nuclear stress ef 57%  . CATARACT EXTRACTION W/ INTRAOCULAR LENS  IMPLANT, BILATERAL Bilateral   . COLONOSCOPY    . ESOPHAGEAL MANOMETRY N/A 10/12/2017   Procedure: ESOPHAGEAL MANOMETRY (EM);  Surgeon: [name], MD;  Location: [facility];  Service: Gastroenterology;  Laterality: N/A;  . ESOPHAGOGASTRODUODENOSCOPY N/A 10/12/2017   Procedure: ESOPHAGOGASTRODUODENOSCOPY (EGD);  Surgeon: [name], MD;  Location: [facility];  Service: Gastroenterology;  Laterality: N/A;  . ESOPHAGOGASTRODUODENOSCOPY (EGD) WITH ESOPHAGEAL DILATION  last one 11-09-2015  dr  Yolanda Bonine   dilation schatzki's ring  . GOLD SEED IMPLANT N/A 04/29/2017   Procedure: GOLD SEED IMPLANT;  Surgeon: [name], MD;  Location: [facility];  Service: Urology;  Laterality: N/A;  . HERNIA REPAIR    . INGUINAL HERNIA REPAIR Right 02/23/2014   Procedure: HERNIA REPAIR INGUINAL ADULT;  Surgeon: Joyice Faster. Cornett, MD;  Location: [place];  Service: General;  Laterality: Right;  . INSERTION OF MESH Right 02/23/2014   Procedure: INSERTION OF MESH;  Surgeon: Joyice Faster. Cornett, MD;  Location: [place];  Service: General;  Laterality: Right;  . PERIPHERAL VASCULAR CATHETERIZATION N/A 05/29/2016   Procedure: Lower Extremity Angiography;  Surgeon: [name], MD;  Location: [facility];  Service: Cardiovascular;  Laterality: N/A; Diamondback orbital rotational atherectomy, PTA and stenting bilateral common iliac arteries   . PILONIDAL CYST / SINUS EXCISION  2000  . PROSTATE BIOPSY  09-22-2016  dr [name] office  . SAVORY DILATION N/A 10/12/2017   Procedure: SAVORY DILATION;  Surgeon: [name], MD;  Location: [facility];  Service: Gastroenterology;  Laterality: N/A;  . SPACE OAR INSTILLATION N/A 04/29/2017   Procedure: SPACE OAR INSTILLATION;  Surgeon: [name], MD;  Location: [facility];  Service: Urology;  Laterality: N/A;  . TRANSTHORACIC ECHOCARDIOGRAM  05-05-2016   dr berry   ef 59-56%,  grade 1diastolic dysfunction/  moderate AV stenosis with mod. AR (right coronary and noncoronary cusp mobility seversly restricted);  valve area 1.25cm^2, mean gradient 27 mmHg, peak gradient 3mmHg/  mild MR  . TREATMENT FISTULA ANAL  2010  . UPPER GASTROINTESTINAL ENDOSCOPY      Current Outpatient Medications  Medication Sig Dispense Refill  . alfuzosin (UROXATRAL) 10 MG 24 hr tablet Take 1 tablet (10 mg total) by mouth daily with breakfast. 30 tablet 0  . aspirin EC 81 MG tablet Take 81 mg by mouth  daily.    . Cholecalciferol (VITAMIN D3) 1000 units CAPS Take 7,000 Units by mouth daily.     . clopidogrel (PLAVIX) 75 MG tablet Take 75 mg daily by mouth.    . fluticasone (FLONASE) 50 MCG/ACT nasal spray Place 2 sprays into both nostrils daily. (Patient taking differently: Place 2 sprays into both nostrils daily as needed for allergies. ) 16 g 3  . HYDROcodone-acetaminophen (NORCO/VICODIN) 5-325 MG tablet Take 1 tablet by mouth every 4 (four) hours as needed for moderate pain.  0  . levothyroxine (SYNTHROID, LEVOTHROID) 50 MCG tablet Take 1 tablet (50 mcg total) by mouth daily. 90 tablet 1  . omeprazole (PRILOSEC) 40 MG capsule Take 40 mg by mouth daily.     No current facility-administered  medications for this visit.     Allergies:   Penicillins; Sulfa antibiotics; and Versed [midazolam]    Social History:  The patient  reports that he quit smoking about 12 years ago. His smoking use included cigarettes. He has a 50.00 pack-year smoking history. He has never used smokeless tobacco. He reports that he does not drink alcohol or use drugs.   Family History:  The patient's family history includes Asthma in his father; Heart attack in his mother; Stroke in his father.    ROS:  Please see the history of present illness. All other systems are reviewed and negative.    PHYSICAL EXAM: VS:  BP 134/62   Pulse 71   Ht 5\' 5"  (1.651 m)   Wt 135 lb (61.2 kg)   BMI 22.47 kg/m  , BMI Body mass index is 22.47 kg/m. GEN: Well nourished, well developed, male in no acute distress  HEENT: normal for age  Neck: minimal JVD, no carotid bruit, no masses Cardiac: RRR; 2-3/6 AS murmur, no rubs, or gallops Respiratory:  Decreased BS bases bilaterally, normal work of breathing GI: soft, nontender, nondistended, + BS MS: no deformity or atrophy; no edema; distal pulses are 2+ in 3/4 extremities, decreased in LLE Skin: warm and dry, no rash Neuro:  Strength and sensation are intact Psych: euthymic mood,  full affect   EKG:  EKG is ordered today. The ekg ordered today demonstrates SR, HR 71, inferior/lateral T wave changes are slightly increased from previous  MYOVIEW: 03/2016 Study Highlights    Nuclear stress EF: 57%.  The left ventricular ejection fraction is normal (55-65%).  There was no ST segment deviation noted during stress.  The study is normal.  This is a low risk study.     ABIs 05/29/2017 Final Interpretation: Right: Resting right ankle-brachial index is within normal range. No evidence of significant right lower extremity arterial disease. The right toe-brachial index is normal. Left: Resting left ankle-brachial index is within normal range. No evidence of significant left lower extremity arterial disease. The left toe-brachial index is abnormal.  AORTA/ILIAC 05/29/2017 Final Interpretation: Abdominal Aorta: The largest aortic measurement is 3.0 cm. The largest aortic diameter remains essentially unchanged compared to prior exam. Previous diameter measurement was 3.1 cm obtained on 12/17. Stenosis: +--------------------+-------------+ Location      Stenosis    +--------------------+-------------+ Mid Aorta      <50% stenosis +--------------------+-------------+ Right Common Iliac >50% stenosis +--------------------+-------------+ Left Common Iliac  >50% stenosis +--------------------+-------------+ Right External Iliac<50% stenosis +--------------------+-------------+ Left External Iliac <50% stenosis +--------------------+-------------+   Recent Labs: 02/26/2017: Magnesium 2.1; TSH 3.10 04/27/2017: ALT 15 09/25/2017: BUN 17; Creatinine, Ser 0.75; Hemoglobin 10.4; Platelets 122; Potassium 4.1; Sodium 139    Lipid Panel    Component Value Date/Time   CHOL 141 02/26/2017 1046   TRIG 58 02/26/2017 1046   HDL 50 02/26/2017 1046   CHOLHDL 2.8 02/26/2017 1046   VLDL 19 09/15/2016 1729   LDLCALC 78 02/26/2017 1046       Wt Readings from Last 3 Encounters:  12/30/17 135 lb (61.2 kg)  10/12/17 138 lb (62.6 kg)  08/18/17 138 lb (62.6 kg)     Other studies Reviewed: Additional studies/ records that were reviewed today include: office notes, hospital records and testing.  ASSESSMENT AND PLAN:  1.  Preop Eval: -He has significant limitations on his activity.  This needs further evaluation before he can be at acceptable risk for the surgery -See below, if his echocardiogram does not warrant  further evaluation, discuss stress testing versus cath with Dr. Gwenlyn Schaefer  2. AS - I am concerned that the aortic stenosis is limiting his activity. -He does not have significant volume overload by exam, but has significant dyspnea on exertion -he also gets weak when he exerts himself. - We will check an echocardiogram, he may need referral for TAVR  3.  PAD: - Pulses in his left lower extremity are slightly decreased, but palpable. - He gets some claudication symptoms with exertion, but no resting symptoms - He is to get Dopplers repeated in December, make sure to schedule these.   Current medicines are reviewed at length with the patient today.  The patient does not have concerns regarding medicines.  The following changes have been made:  no change  Labs/ tests ordered today include:  No orders of the defined types were placed in this encounter.    Disposition:   FU with Dr. Gwenlyn Schaefer  Signed, Robert Ferries, PA-C  12/30/2017 8:45 AM    [place] Phone: [phone]; Fax: [phone]  This note was written with the assistance of speech recognition software. Please excuse any transcriptional errors.

## 2018-01-01 ENCOUNTER — Other Ambulatory Visit: Payer: Self-pay

## 2018-01-01 ENCOUNTER — Ambulatory Visit (HOSPITAL_COMMUNITY): Payer: Medicare Other | Attending: Cardiovascular Disease

## 2018-01-01 DIAGNOSIS — R55 Syncope and collapse: Secondary | ICD-10-CM | POA: Insufficient documentation

## 2018-01-01 DIAGNOSIS — G4733 Obstructive sleep apnea (adult) (pediatric): Secondary | ICD-10-CM | POA: Diagnosis not present

## 2018-01-01 DIAGNOSIS — E785 Hyperlipidemia, unspecified: Secondary | ICD-10-CM | POA: Insufficient documentation

## 2018-01-01 DIAGNOSIS — I350 Nonrheumatic aortic (valve) stenosis: Secondary | ICD-10-CM | POA: Diagnosis not present

## 2018-01-01 DIAGNOSIS — Z87891 Personal history of nicotine dependence: Secondary | ICD-10-CM | POA: Diagnosis not present

## 2018-01-01 DIAGNOSIS — J449 Chronic obstructive pulmonary disease, unspecified: Secondary | ICD-10-CM | POA: Diagnosis not present

## 2018-01-01 DIAGNOSIS — I088 Other rheumatic multiple valve diseases: Secondary | ICD-10-CM | POA: Insufficient documentation

## 2018-01-01 DIAGNOSIS — I739 Peripheral vascular disease, unspecified: Secondary | ICD-10-CM | POA: Insufficient documentation

## 2018-01-01 DIAGNOSIS — I10 Essential (primary) hypertension: Secondary | ICD-10-CM | POA: Diagnosis not present

## 2018-01-08 ENCOUNTER — Ambulatory Visit (INDEPENDENT_AMBULATORY_CARE_PROVIDER_SITE_OTHER): Payer: Medicare Other | Admitting: Cardiovascular Disease

## 2018-01-08 ENCOUNTER — Encounter: Payer: Self-pay | Admitting: Cardiovascular Disease

## 2018-01-08 VITALS — BP 142/50 | HR 70 | Ht 65.0 in | Wt 136.0 lb

## 2018-01-08 DIAGNOSIS — K559 Vascular disorder of intestine, unspecified: Secondary | ICD-10-CM

## 2018-01-08 DIAGNOSIS — I350 Nonrheumatic aortic (valve) stenosis: Secondary | ICD-10-CM

## 2018-01-08 DIAGNOSIS — I10 Essential (primary) hypertension: Secondary | ICD-10-CM | POA: Diagnosis not present

## 2018-01-08 NOTE — Progress Notes (Signed)
01/08/2018 Robert Schaefer   [birth year]  [id number]  Primary Physician [name], MD Primary Cardiologist: [name] MD Lupe Carney, [place]  HPI:  Robert Schaefer is a 77 y.o.   married Caucasian male father of 2 children, grandfather 5 grandchildren who is covering by his wife Robert Schaefer  He was referred by Dr. Melford Aase for cardiovascular evaluation because of chest pain and claudication. I last saw him in the office 09/16/2016. His cardiovascular risk factor profile is notable for 100 pack years of tobacco abuse having quit 10 years ago. He has treated hypertension and hyperlipidemia. There is a family history of heart disease with a mother who died of a myocardial infarction age 31. He has never had a heart attack or stroke. He has had recent chest pain with a CT angiogram that ruled out pulmonary embolus but did show diffuse coronary calcification. An abdominal pelvic CT performed because of unexplained weight loss showed aortoiliac calcification size/atherosclerosis as well as atherosclerosis of the superior mesenteric artery and left renal artery. He complains of right greater than leftleg claudication of the last year occurring after walking 4-5 minutes. Since I saw him in the office he's had a Myoview stress test which was nonischemic problem, a 2-D echo that showed normal LV function with moderate aortic stenosis, carotid Dopplers that showed no evidence of ICA stenosis and lower extremity Dopplers reveal a right ABI of 0.48 with an occluded right common iliac and a left ABI of 1.23 with moderate left common iliac artery stenosis. He also complains a 30 pound weight loss of and had SMA calcification on abdominal CT. He may have mesenteric ischemia as well. He underwent peripheral angiography and intervention by myself 05/29/16 demonstrating a small abdominal aortic aneurysm, 95% calcified ostial superior mesenteric artery stenosis, right common iliac chronic total occlusion was calcified  and high-grade common iliac artery stenosis. He underwent diamondback orbital rotational atherectomy, PTA and covered stenting using Lifestream covered stents" of both common iliac arteries using "kissing stent technique. His claudication symptoms have resolved. The ABIs are normal. She does have a high-frequency signal in his right common iliac artery demonstrated a duplex ultrasound 06/09/16 was asymptomatic from this.  Since I saw him last year he is done well.  He does now have achalasia and is unable to eat and is losing weight.  He has had syncope in the past as well as generalized fatigue.  Recent 2D echo performed 01/01/2018 showed critical aortic stenosis with a valve area 0.42 cm and a peak aortic gradient of 120 mmHg.   Current Meds  Medication Sig  . alfuzosin (UROXATRAL) 10 MG 24 hr tablet Take 1 tablet (10 mg total) by mouth daily with breakfast.  . aspirin EC 81 MG tablet Take 81 mg by mouth daily.  . Cholecalciferol (VITAMIN D3) 1000 units CAPS Take 7,000 Units by mouth daily.   . clopidogrel (PLAVIX) 75 MG tablet Take 75 mg daily by mouth.  . fluticasone (FLONASE) 50 MCG/ACT nasal spray Place 2 sprays into both nostrils daily. (Patient taking differently: Place 2 sprays into both nostrils daily as needed for allergies. )  . HYDROcodone-acetaminophen (NORCO/VICODIN) 5-325 MG tablet Take 1 tablet by mouth every 4 (four) hours as needed for moderate pain.  [name] levothyroxine (SYNTHROID, LEVOTHROID) 50 MCG tablet Take 1 tablet (50 mcg total) by mouth daily.  [name] omeprazole (PRILOSEC) 40 MG capsule Take 40 mg by mouth daily.     Allergies  Allergen Reactions  . Penicillins  Anaphylaxis, Itching, Swelling and Other (See Comments)    Has patient had a PCN reaction causing immediate rash, facial/tongue/throat swelling, SOB or lightheadedness with hypotension: Yes Has patient had a PCN reaction causing severe rash involving mucus membranes or skin necrosis: No Has patient had a PCN reaction  that required hospitalization: Yes Has patient had a PCN reaction occurring within the last 10 years: No If all of the above answers are "NO", then may proceed with Cephalosporin use.   . Sulfa Antibiotics Anaphylaxis and Swelling  . Versed [Midazolam] Other (See Comments)    Confusion, delerium    Social History   Socioeconomic History  . Marital status: Married    Spouse name: Not on file  . Number of children: Not on file  . Years of education: Not on file  . Highest education level: Not on file  Occupational History  . Not on file  Social Needs  . Financial resource strain: Not on file  . Food insecurity:    Worry: Not on file    Inability: Not on file  . Transportation needs:    Medical: Not on file    Non-medical: Not on file  Tobacco Use  . Smoking status: Former Smoker    Packs/day: 1.00    Years: 50.00    Pack years: 50.00    Types: Cigarettes    Last attempt to quit: 07/05/2005    Years since quitting: 12.5  . Smokeless tobacco: Never Used  Substance and Sexual Activity  . Alcohol use: No  . Drug use: No  . Sexual activity: Never  Lifestyle  . Physical activity:    Days per week: Not on file    Minutes per session: Not on file  . Stress: Not on file  Relationships  . Social connections:    Talks on phone: Not on file    Gets together: Not on file    Attends religious service: Not on file    Active member of club or organization: Not on file    Attends meetings of clubs or organizations: Not on file    Relationship status: Not on file  . Intimate partner violence:    Fear of current or ex partner: Not on file    Emotionally abused: Not on file    Physically abused: Not on file    Forced sexual activity: Not on file  Other Topics Concern  . Not on file  Social History Narrative  . Not on file     Review of Systems: General: negative for chills, fever, night sweats or weight changes.  Cardiovascular: negative for chest pain, dyspnea on exertion,  edema, orthopnea, palpitations, paroxysmal nocturnal dyspnea or shortness of breath Dermatological: negative for rash Respiratory: negative for cough or wheezing Urologic: negative for hematuria Abdominal: negative for nausea, vomiting, diarrhea, bright red blood per rectum, melena, or hematemesis Neurologic: negative for visual changes, syncope, or dizziness All other systems reviewed and are otherwise negative except as noted above.    Blood pressure (!) 142/50, pulse 70, height 5\' 5"  (1.651 m), weight 136 lb (61.7 kg).  General appearance: alert and no distress Neck: no adenopathy, no JVD, supple, symmetrical, trachea midline, thyroid not enlarged, symmetric, no tenderness/mass/nodules and Soft bilateral carotid bruits Lungs: clear to auscultation bilaterally Heart: 2/6 high-pitched systolic ejection murmur consistent with aortic stenosis. Extremities: extremities normal, atraumatic, no cyanosis or edema Pulses: 2+ and symmetric Skin: Skin color, texture, turgor normal. No rashes or lesions Neurologic: Alert and oriented X  3, normal strength and tone. Normal symmetric reflexes. Normal coordination and gait  EKG not performed today  ASSESSMENT AND PLAN:   Essential hypertension History of essential hypertension with blood pressure measured at 142/50.  He is not on antihypertensive medications.  Hyperlipidemia History of hyperlipidemia not on statin therapy.  PVD (peripheral vascular disease) ([place]) History of peripheral vascular disease status post PTA and stenting of a right common iliac artery CTO and high-grade left common iliac artery stenosis he did have a small abdominal aortic aneurysm and a 7080% ossified mid right SFA stenosis.  His claudication markedly improved after that but over the last year or so it has gotten mildly worse.  His Dopplers recently performed show patent stents.  Aortic stenosis, moderate History of aortic stenosis which was moderate back in 2017 and  now is critical by echo performed 01/01/2018.  He does have normal LV systolic function with an aortic valve area of 0.42 cm and a peak gradient of 120 mill meters of mercury which is increased from 85 mmHg last year.  He has had syncope in the past and also complains of fatigue and lack of energy.  He apparently has achalasia and needs surgical correction of this but cannot undergo general anesthesia until we address his valvular issue.  He will need a right left heart cath to further evaluate his coronary anatomy and his aortic valve.  Mesenteric ischemia (HCC) History of a 95% proximal SMA stenosis which I demonstrated by angiogram 05/29/2016.  He really does not have abdominal angina by history.      [name] MD FACP,FACC,FAHA, [facility] 01/08/2018 4:08 PM

## 2018-01-08 NOTE — Assessment & Plan Note (Signed)
History of a 95% proximal SMA stenosis which I demonstrated by angiogram 05/29/2016.  He really does not have abdominal angina by history.

## 2018-01-08 NOTE — Assessment & Plan Note (Signed)
History of peripheral vascular disease status post PTA and stenting of a right common iliac artery CTO and high-grade left common iliac artery stenosis he did have a small abdominal aortic aneurysm and a 7080% ossified mid right SFA stenosis.  His claudication markedly improved after that but over the last year or so it has gotten mildly worse.  His Dopplers recently performed show patent stents.

## 2018-01-08 NOTE — Assessment & Plan Note (Signed)
History of aortic stenosis which was moderate back in 2017 and now is critical by echo performed 01/01/2018.  He does have normal LV systolic function with an aortic valve area of 0.42 cm and a peak gradient of 120 mill meters of mercury which is increased from 85 mmHg last year.  He has had syncope in the past and also complains of fatigue and lack of energy.  He apparently has achalasia and needs surgical correction of this but cannot undergo general anesthesia until we address his valvular issue.  He will need a right left heart cath to further evaluate his coronary anatomy and his aortic valve.

## 2018-01-08 NOTE — Patient Instructions (Addendum)
   [facility] [address] [place] [address] Dept: [phone] Loc: [place]  01/08/2018  You are scheduled for a Cardiac Catheterization on Monday, July 29 with Dr. Quay Burow.  1. Please arrive at the [facility] (Main Entrance A) at [facility]: [place], [place] [address] at 11AM (two hours before your procedure to ensure your preparation). Free valet parking service is available.   Special note: Every effort is made to have your procedure done on time. Please understand that emergencies sometimes delay scheduled procedures.  2. Diet: Do not eat or drink anything after midnight prior to your procedure except sips of water to take medications.  3. Labs: Your physician recommends that you HAVE LAB WORK TODAY  4. Medication instructions in preparation for your procedure:  On the morning of your procedure, take your Plavix/Clopidogrel and any morning medicines NOT listed above.  You may use sips of water.  5. Plan for one night stay--bring personal belongings. 6. Bring a current list of your medications and current insurance cards. 7. You MUST have a responsible person to drive you home. 8. Someone MUST be with you the first 24 hours after you arrive home or your discharge will be delayed. 9. Please wear clothes that are easy to get on and off and wear slip-on shoes.  Thank you for allowing [place] to care for you!   -- [facility]

## 2018-01-08 NOTE — Assessment & Plan Note (Signed)
History of essential hypertension with blood pressure measured at 142/50.  He is not on antihypertensive medications.

## 2018-01-08 NOTE — Assessment & Plan Note (Signed)
History of hyperlipidemia not on statin therapy. 

## 2018-01-08 NOTE — H&P (View-Only) (Signed)
01/08/2018 Robert Schaefer   May 04, 1941  [id number]  Primary Physician [name], MD Primary Cardiologist: [name] MD Robert Schaefer, [place]  HPI:  Robert Schaefer is a 77 y.o.   married Caucasian male father of 2 children, grandfather 5 grandchildren who is covering by his wife Robert Schaefer  He was referred by Dr. Melford Aase for cardiovascular evaluation because of chest pain and claudication. I last saw him in the office 09/16/2016. His cardiovascular risk factor profile is notable for 100 pack years of tobacco abuse having quit 10 years ago. He has treated hypertension and hyperlipidemia. There is a family history of heart disease with a mother who died of a myocardial infarction age 20. He has never had a heart attack or stroke. He has had recent chest pain with a CT angiogram that ruled out pulmonary embolus but did show diffuse coronary calcification. An abdominal pelvic CT performed because of unexplained weight loss showed aortoiliac calcification size/atherosclerosis as well as atherosclerosis of the superior mesenteric artery and left renal artery. He complains of right greater than leftleg claudication of the last year occurring after walking 4-5 minutes. Since I saw him in the office he's had a Myoview stress test which was nonischemic problem, a 2-D echo that showed normal LV function with moderate aortic stenosis, carotid Dopplers that showed no evidence of ICA stenosis and lower extremity Dopplers reveal a right ABI of 0.48 with an occluded right common iliac and a left ABI of 1.23 with moderate left common iliac artery stenosis. He also complains a 30 pound weight loss of and had SMA calcification on abdominal CT. He may have mesenteric ischemia as well. He underwent peripheral angiography and intervention by myself 05/29/16 demonstrating a small abdominal aortic aneurysm, 95% calcified ostial superior mesenteric artery stenosis, right common iliac chronic total occlusion was calcified  and high-grade common iliac artery stenosis. He underwent diamondback orbital rotational atherectomy, PTA and covered stenting using Lifestream covered stents" of both common iliac arteries using "kissing stent technique. His claudication symptoms have resolved. The ABIs are normal. She does have a high-frequency signal in his right common iliac artery demonstrated a duplex ultrasound 06/09/16 was asymptomatic from this.  Since I saw him last year he is done well.  He does now have achalasia and is unable to eat and is losing weight.  He has had syncope in the past as well as generalized fatigue.  Recent 2D echo performed 01/01/2018 showed critical aortic stenosis with a valve area 0.42 cm and a peak aortic gradient of 120 mmHg.   Current Meds  Medication Sig  . alfuzosin (UROXATRAL) 10 MG 24 hr tablet Take 1 tablet (10 mg total) by mouth daily with breakfast.  . aspirin EC 81 MG tablet Take 81 mg by mouth daily.  . Cholecalciferol (VITAMIN D3) 1000 units CAPS Take 7,000 Units by mouth daily.   . clopidogrel (PLAVIX) 75 MG tablet Take 75 mg daily by mouth.  . fluticasone (FLONASE) 50 MCG/ACT nasal spray Place 2 sprays into both nostrils daily. (Patient taking differently: Place 2 sprays into both nostrils daily as needed for allergies. )  . HYDROcodone-acetaminophen (NORCO/VICODIN) 5-325 MG tablet Take 1 tablet by mouth every 4 (four) hours as needed for moderate pain.  [name] levothyroxine (SYNTHROID, LEVOTHROID) 50 MCG tablet Take 1 tablet (50 mcg total) by mouth daily.  [name] omeprazole (PRILOSEC) 40 MG capsule Take 40 mg by mouth daily.     Allergies  Allergen Reactions  . Penicillins  Anaphylaxis, Itching, Swelling and Other (See Comments)    Has patient had a PCN reaction causing immediate rash, facial/tongue/throat swelling, SOB or lightheadedness with hypotension: Yes Has patient had a PCN reaction causing severe rash involving mucus membranes or skin necrosis: No Has patient had a PCN reaction  that required hospitalization: Yes Has patient had a PCN reaction occurring within the last 10 years: No If all of the above answers are "NO", then may proceed with Cephalosporin use.   . Sulfa Antibiotics Anaphylaxis and Swelling  . Versed [Midazolam] Other (See Comments)    Confusion, delerium    Social History   Socioeconomic History  . Marital status: Married    Spouse name: Not on file  . Number of children: Not on file  . Years of education: Not on file  . Highest education level: Not on file  Occupational History  . Not on file  Social Needs  . Financial resource strain: Not on file  . Food insecurity:    Worry: Not on file    Inability: Not on file  . Transportation needs:    Medical: Not on file    Non-medical: Not on file  Tobacco Use  . Smoking status: Former Smoker    Packs/day: 1.00    Years: 50.00    Pack years: 50.00    Types: Cigarettes    Last attempt to quit: 07/05/2005    Years since quitting: 12.5  . Smokeless tobacco: Never Used  Substance and Sexual Activity  . Alcohol use: No  . Drug use: No  . Sexual activity: Never  Lifestyle  . Physical activity:    Days per week: Not on file    Minutes per session: Not on file  . Stress: Not on file  Relationships  . Social connections:    Talks on phone: Not on file    Gets together: Not on file    Attends religious service: Not on file    Active member of club or organization: Not on file    Attends meetings of clubs or organizations: Not on file    Relationship status: Not on file  . Intimate partner violence:    Fear of current or ex partner: Not on file    Emotionally abused: Not on file    Physically abused: Not on file    Forced sexual activity: Not on file  Other Topics Concern  . Not on file  Social History Narrative  . Not on file     Review of Systems: General: negative for chills, fever, night sweats or weight changes.  Cardiovascular: negative for chest pain, dyspnea on exertion,  edema, orthopnea, palpitations, paroxysmal nocturnal dyspnea or shortness of breath Dermatological: negative for rash Respiratory: negative for cough or wheezing Urologic: negative for hematuria Abdominal: negative for nausea, vomiting, diarrhea, bright red blood per rectum, melena, or hematemesis Neurologic: negative for visual changes, syncope, or dizziness All other systems reviewed and are otherwise negative except as noted above.    Blood pressure (!) 142/50, pulse 70, height 5\' 5"  (1.651 m), weight 136 lb (61.7 kg).  General appearance: alert and no distress Neck: no adenopathy, no JVD, supple, symmetrical, trachea midline, thyroid not enlarged, symmetric, no tenderness/mass/nodules and Soft bilateral carotid bruits Lungs: clear to auscultation bilaterally Heart: 2/6 high-pitched systolic ejection murmur consistent with aortic stenosis. Extremities: extremities normal, atraumatic, no cyanosis or edema Pulses: 2+ and symmetric Skin: Skin color, texture, turgor normal. No rashes or lesions Neurologic: Alert and oriented X  3, normal strength and tone. Normal symmetric reflexes. Normal coordination and gait  EKG not performed today  ASSESSMENT AND PLAN:   Essential hypertension History of essential hypertension with blood pressure measured at 142/50.  He is not on antihypertensive medications.  Hyperlipidemia History of hyperlipidemia not on statin therapy.  PVD (peripheral vascular disease) ([place]) History of peripheral vascular disease status post PTA and stenting of a right common iliac artery CTO and high-grade left common iliac artery stenosis he did have a small abdominal aortic aneurysm and a 7080% ossified mid right SFA stenosis.  His claudication markedly improved after that but over the last year or so it has gotten mildly worse.  His Dopplers recently performed show patent stents.  Aortic stenosis, moderate History of aortic stenosis which was moderate back in 2017 and  now is critical by echo performed 01/01/2018.  He does have normal LV systolic function with an aortic valve area of 0.42 cm and a peak gradient of 120 mill meters of mercury which is increased from 85 mmHg last year.  He has had syncope in the past and also complains of fatigue and lack of energy.  He apparently has achalasia and needs surgical correction of this but cannot undergo general anesthesia until we address his valvular issue.  He will need a right left heart cath to further evaluate his coronary anatomy and his aortic valve.  Mesenteric ischemia (HCC) History of a 95% proximal SMA stenosis which I demonstrated by angiogram 05/29/2016.  He really does not have abdominal angina by history.      [name] MD FACP,FACC,FAHA, [facility] 01/08/2018 4:08 PM

## 2018-01-09 LAB — CBC
HEMOGLOBIN: 12 g/dL — AB (ref 13.0–17.7)
Hematocrit: 35 % — ABNORMAL LOW (ref 37.5–51.0)
MCH: 32.6 pg (ref 26.6–33.0)
MCHC: 34.3 g/dL (ref 31.5–35.7)
MCV: 95 fL (ref 79–97)
Platelets: 118 10*3/uL — ABNORMAL LOW (ref 150–450)
RBC: 3.68 x10E6/uL — AB (ref 4.14–5.80)
RDW: 14 % (ref 12.3–15.4)
WBC: 4.4 10*3/uL (ref 3.4–10.8)

## 2018-01-09 LAB — BASIC METABOLIC PANEL
BUN / CREAT RATIO: 20 (ref 10–24)
BUN: 16 mg/dL (ref 8–27)
CO2: 27 mmol/L (ref 20–29)
CREATININE: 0.8 mg/dL (ref 0.76–1.27)
Calcium: 10.2 mg/dL (ref 8.6–10.2)
Chloride: 98 mmol/L (ref 96–106)
GFR calc Af Amer: 100 mL/min/{1.73_m2} (ref >59)
GFR calc non Af Amer: 86 mL/min/{1.73_m2} (ref >59)
GLUCOSE: 100 mg/dL — AB (ref 65–99)
Potassium: 4.1 mmol/L (ref 3.5–5.2)
SODIUM: 138 mmol/L (ref 134–144)

## 2018-01-21 ENCOUNTER — Other Ambulatory Visit: Payer: Self-pay | Admitting: *Deleted

## 2018-01-21 ENCOUNTER — Telehealth: Payer: Self-pay | Admitting: *Deleted

## 2018-01-21 NOTE — Telephone Encounter (Signed)
Pt contacted pre-catheterization scheduled at [facility] for: Monday January 25, 2018 1:30 PM Verified arrival time and place: [place] Entrance A at: 11:30 AM  No solid food after midnight prior to cath, clear liquids until 5 AM day of procedure. Verified allergies in Epic Verified no diabetes medications.  AM meds can be  taken pre-cath with sip of water including: ASA 81 mg Clopidogrel 75 mg  Confirmed patient has responsible person to drive home post procedure and for 24 hours after you arrive home: yes

## 2018-01-23 ENCOUNTER — Other Ambulatory Visit: Payer: Self-pay | Admitting: Internal Medicine

## 2018-01-25 ENCOUNTER — Encounter (HOSPITAL_COMMUNITY)
Admission: RE | Disposition: A | Discharge status: Home / Self Care | Payer: Self-pay | Source: Ambulatory Visit | Attending: Cardiovascular Disease

## 2018-01-25 ENCOUNTER — Ambulatory Visit (HOSPITAL_COMMUNITY)
Admission: RE | Admit: 2018-01-25 | Discharge: 2018-01-26 | Disposition: A | Discharge status: Home / Self Care | Payer: Medicare Other | Source: Ambulatory Visit | Attending: Cardiovascular Disease | Admitting: Cardiovascular Disease

## 2018-01-25 ENCOUNTER — Other Ambulatory Visit: Payer: Self-pay

## 2018-01-25 ENCOUNTER — Encounter (HOSPITAL_COMMUNITY): Payer: Self-pay | Admitting: General Practice

## 2018-01-25 DIAGNOSIS — Z882 Allergy status to sulfonamides status: Secondary | ICD-10-CM | POA: Diagnosis not present

## 2018-01-25 DIAGNOSIS — K219 Gastro-esophageal reflux disease without esophagitis: Secondary | ICD-10-CM | POA: Insufficient documentation

## 2018-01-25 DIAGNOSIS — Z87891 Personal history of nicotine dependence: Secondary | ICD-10-CM | POA: Insufficient documentation

## 2018-01-25 DIAGNOSIS — Z880 Allergy status to penicillin: Secondary | ICD-10-CM | POA: Diagnosis not present

## 2018-01-25 DIAGNOSIS — I714 Abdominal aortic aneurysm, without rupture: Secondary | ICD-10-CM | POA: Insufficient documentation

## 2018-01-25 DIAGNOSIS — Z9862 Peripheral vascular angioplasty status: Secondary | ICD-10-CM | POA: Diagnosis not present

## 2018-01-25 DIAGNOSIS — Z7951 Long term (current) use of inhaled steroids: Secondary | ICD-10-CM | POA: Diagnosis not present

## 2018-01-25 DIAGNOSIS — J449 Chronic obstructive pulmonary disease, unspecified: Secondary | ICD-10-CM | POA: Insufficient documentation

## 2018-01-25 DIAGNOSIS — Z7902 Long term (current) use of antithrombotics/antiplatelets: Secondary | ICD-10-CM | POA: Diagnosis not present

## 2018-01-25 DIAGNOSIS — K551 Chronic vascular disorders of intestine: Secondary | ICD-10-CM | POA: Insufficient documentation

## 2018-01-25 DIAGNOSIS — Z8249 Family history of ischemic heart disease and other diseases of the circulatory system: Secondary | ICD-10-CM | POA: Insufficient documentation

## 2018-01-25 DIAGNOSIS — K220 Achalasia of cardia: Secondary | ICD-10-CM | POA: Diagnosis not present

## 2018-01-25 DIAGNOSIS — I10 Essential (primary) hypertension: Secondary | ICD-10-CM | POA: Diagnosis not present

## 2018-01-25 DIAGNOSIS — K589 Irritable bowel syndrome without diarrhea: Secondary | ICD-10-CM | POA: Diagnosis not present

## 2018-01-25 DIAGNOSIS — Z7982 Long term (current) use of aspirin: Secondary | ICD-10-CM | POA: Diagnosis not present

## 2018-01-25 DIAGNOSIS — E785 Hyperlipidemia, unspecified: Secondary | ICD-10-CM | POA: Diagnosis not present

## 2018-01-25 DIAGNOSIS — I350 Nonrheumatic aortic (valve) stenosis: Secondary | ICD-10-CM | POA: Insufficient documentation

## 2018-01-25 HISTORY — PX: CARDIAC CATHETERIZATION: SHX172

## 2018-01-25 HISTORY — PX: RIGHT/LEFT HEART CATH AND CORONARY ANGIOGRAPHY: CATH118266

## 2018-01-25 HISTORY — PX: ABDOMINAL AORTOGRAM: CATH118222

## 2018-01-25 LAB — POCT I-STAT 3, ART BLOOD GAS (G3+)
ACID-BASE DEFICIT: 1 mmol/L (ref 0.0–2.0)
Bicarbonate: 23.6 mmol/L (ref 20.0–28.0)
O2 SAT: 96 %
TCO2: 25 mmol/L (ref 22–32)
pCO2 arterial: 38.1 mmHg (ref 32.0–48.0)
pH, Arterial: 7.401 (ref 7.350–7.450)
pO2, Arterial: 83 mmHg (ref 83.0–108.0)

## 2018-01-25 LAB — POCT I-STAT 3, VENOUS BLOOD GAS (G3P V)
ACID-BASE DEFICIT: 1 mmol/L (ref 0.0–2.0)
Bicarbonate: 24.3 mmol/L (ref 20.0–28.0)
O2 SAT: 70 %
PH VEN: 7.364 (ref 7.250–7.430)
PO2 VEN: 38 mmHg (ref 32.0–45.0)
TCO2: 26 mmol/L (ref 22–32)
pCO2, Ven: 42.5 mmHg — ABNORMAL LOW (ref 44.0–60.0)

## 2018-01-25 SURGERY — RIGHT/LEFT HEART CATH AND CORONARY ANGIOGRAPHY
Anesthesia: LOCAL

## 2018-01-25 MED ORDER — HEPARIN (PORCINE) IN NACL 1000-0.9 UT/500ML-% IV SOLN
INTRAVENOUS | Status: DC | PRN
  Administered 2018-01-25: 500 mL

## 2018-01-25 MED ORDER — ACETAMINOPHEN 325 MG PO TABS: 650.0000 mg | ORAL_TABLET | ORAL | Status: DC | PRN

## 2018-01-25 MED ORDER — SODIUM CHLORIDE 0.9% FLUSH: 3.0000 mL | Freq: Two times a day (BID) | INTRAVENOUS | Status: DC

## 2018-01-25 MED ORDER — ALBUTEROL SULFATE (2.5 MG/3ML) 0.083% IN NEBU: 3.0000 mL | INHALATION_SOLUTION | Freq: Four times a day (QID) | RESPIRATORY_TRACT | Status: DC | PRN

## 2018-01-25 MED ORDER — MORPHINE SULFATE (PF) 10 MG/ML IV SOLN: 2.0000 mg | INTRAVENOUS | Status: DC | PRN

## 2018-01-25 MED ORDER — IOPAMIDOL (ISOVUE-370) INJECTION 76%
INTRAVENOUS | Status: AC
  Filled 2018-01-25: qty 100

## 2018-01-25 MED ORDER — ONDANSETRON HCL 4 MG/2ML IJ SOLN: 4.0000 mg | Freq: Four times a day (QID) | INTRAMUSCULAR | Status: DC | PRN

## 2018-01-25 MED ORDER — LIDOCAINE HCL (PF) 1 % IJ SOLN
INTRAMUSCULAR | Status: DC | PRN
  Administered 2018-01-25: 20 mL

## 2018-01-25 MED ORDER — SODIUM CHLORIDE 0.9 % WEIGHT BASED INFUSION: 1.0000 mL/kg/h | INTRAVENOUS | Status: DC

## 2018-01-25 MED ORDER — FENTANYL CITRATE (PF) 100 MCG/2ML IJ SOLN
INTRAMUSCULAR | Status: DC | PRN
  Administered 2018-01-25: 25 ug via INTRAVENOUS

## 2018-01-25 MED ORDER — ASPIRIN 81 MG PO CHEW: 81.0000 mg | CHEWABLE_TABLET | Freq: Every day | ORAL | Status: DC

## 2018-01-25 MED ORDER — SODIUM CHLORIDE 0.9 % IV SOLN: 250.0000 mL | INTRAVENOUS | Status: DC | PRN

## 2018-01-25 MED ORDER — ENSURE ENLIVE PO LIQD: 237.0000 mL | Freq: Two times a day (BID) | ORAL | Status: DC

## 2018-01-25 MED ORDER — HYDRALAZINE HCL 20 MG/ML IJ SOLN
INTRAMUSCULAR | Status: AC
  Filled 2018-01-25: qty 1

## 2018-01-25 MED ORDER — FENTANYL CITRATE (PF) 100 MCG/2ML IJ SOLN
INTRAMUSCULAR | Status: AC
  Filled 2018-01-25: qty 2

## 2018-01-25 MED ORDER — IOPAMIDOL (ISOVUE-370) INJECTION 76%
INTRAVENOUS | Status: DC | PRN
  Administered 2018-01-25: 60 mL via INTRA_ARTERIAL

## 2018-01-25 MED ORDER — ASPIRIN 81 MG PO CHEW
CHEWABLE_TABLET | ORAL | Status: AC
  Administered 2018-01-25: 81 mg via ORAL
  Filled 2018-01-25: qty 1

## 2018-01-25 MED ORDER — SODIUM CHLORIDE 0.9% FLUSH: 3.0000 mL | INTRAVENOUS | Status: DC | PRN

## 2018-01-25 MED ORDER — SODIUM CHLORIDE 0.9 % WEIGHT BASED INFUSION
3.0000 mL/kg/h | INTRAVENOUS | Status: DC
  Administered 2018-01-25: 3 mL/kg/h via INTRAVENOUS

## 2018-01-25 MED ORDER — ENSURE ENLIVE PO LIQD
237.0000 mL | Freq: Two times a day (BID) | ORAL | Status: DC
  Administered 2018-01-26: 11:00:00 237 mL via ORAL
  Filled 2018-01-25 (×2): qty 237

## 2018-01-25 MED ORDER — ASPIRIN 81 MG PO CHEW
81.0000 mg | CHEWABLE_TABLET | ORAL | Status: AC
  Administered 2018-01-25: 81 mg via ORAL

## 2018-01-25 MED ORDER — HYDROCODONE-ACETAMINOPHEN 5-325 MG PO TABS: 1.0000 | ORAL_TABLET | ORAL | Status: DC | PRN

## 2018-01-25 MED ORDER — ASPIRIN EC 81 MG PO TBEC
81.0000 mg | DELAYED_RELEASE_TABLET | Freq: Every day | ORAL | Status: DC
  Administered 2018-01-26: 10:00:00 81 mg via ORAL
  Filled 2018-01-25: qty 1

## 2018-01-25 MED ORDER — HYDRALAZINE HCL 20 MG/ML IJ SOLN
INTRAMUSCULAR | Status: DC | PRN
  Administered 2018-01-25: 10 mg via INTRAVENOUS

## 2018-01-25 MED ORDER — MORPHINE SULFATE (PF) 2 MG/ML IV SOLN: 2.0000 mg | INTRAVENOUS | Status: DC | PRN

## 2018-01-25 MED ORDER — CLOPIDOGREL BISULFATE 75 MG PO TABS
75.0000 mg | ORAL_TABLET | Freq: Every day | ORAL | Status: DC
  Administered 2018-01-26: 75 mg via ORAL
  Filled 2018-01-25: qty 1

## 2018-01-25 MED ORDER — LEVOTHYROXINE SODIUM 50 MCG PO TABS
50.0000 ug | ORAL_TABLET | Freq: Every day | ORAL | Status: DC
  Administered 2018-01-26: 50 ug via ORAL
  Filled 2018-01-25: qty 1

## 2018-01-25 MED ORDER — SODIUM CHLORIDE 0.9 % IV SOLN
250.0000 mL | INTRAVENOUS | Status: DC | PRN
Start: 2018-01-25 — End: 2018-01-26

## 2018-01-25 MED ORDER — CLOPIDOGREL BISULFATE 75 MG PO TABS
ORAL_TABLET | ORAL | Status: AC
  Administered 2018-01-25: 75 mg via ORAL
  Filled 2018-01-25: qty 1

## 2018-01-25 MED ORDER — CLOPIDOGREL BISULFATE 75 MG PO TABS
75.0000 mg | ORAL_TABLET | ORAL | Status: AC
  Administered 2018-01-25: 75 mg via ORAL

## 2018-01-25 MED ORDER — SODIUM CHLORIDE 0.9 % IV SOLN: INTRAVENOUS | Status: AC

## 2018-01-25 MED ORDER — LIDOCAINE HCL (PF) 1 % IJ SOLN
INTRAMUSCULAR | Status: AC
  Filled 2018-01-25: qty 30

## 2018-01-25 MED ORDER — HEPARIN (PORCINE) IN NACL 1000-0.9 UT/500ML-% IV SOLN
INTRAVENOUS | Status: AC
  Filled 2018-01-25: qty 1000

## 2018-01-25 SURGICAL SUPPLY — 11 items
CATH INFINITI MULTIPACK ST 5F (CATHETERS) ×1 IMPLANT
CATH SWAN GANZ 7F STRAIGHT (CATHETERS) ×2 IMPLANT
KIT HEART LEFT (KITS) ×2 IMPLANT
PACK CARDIAC CATHETERIZATION (CUSTOM PROCEDURE TRAY) ×2 IMPLANT
SHEATH PINNACLE 5F 10CM (SHEATH) IMPLANT
SHEATH PINNACLE 6F 10CM (SHEATH) ×1 IMPLANT
SHEATH PINNACLE 7F 10CM (SHEATH) ×1 IMPLANT
SYR MEDRAD MARK V 150ML (SYRINGE) ×2 IMPLANT
TRANSDUCER W/STOPCOCK (MISCELLANEOUS) ×2 IMPLANT
WIRE EMERALD 3MM-J .035X150CM (WIRE) ×1 IMPLANT
WIRE EMERALD ST .035X150CM (WIRE) ×1 IMPLANT

## 2018-01-25 NOTE — Interval H&P Note (Signed)
Cath Lab Visit (complete for each Cath Lab visit)  Clinical Evaluation Leading to the Procedure:   ACS: No.  Non-ACS:    Anginal Classification: CCS II  Anti-ischemic medical therapy: No Therapy  Non-Invasive Test Results: No non-invasive testing performed  Prior CABG: No previous CABG      History and Physical Interval Note:  01/25/2018 2:45 PM  Yehuda Budd  has presented today for surgery, with the diagnosis of aortic stenosis  The various methods of treatment have been discussed with the patient and family. After consideration of risks, benefits and other options for treatment, the patient has consented to  Procedure(s): RIGHT/LEFT HEART CATH AND CORONARY ANGIOGRAPHY (N/A) as a surgical intervention .  The patient's history has been reviewed, patient examined, no change in status, stable for surgery.  I have reviewed the patient's chart and labs.  Questions were answered to the patient's satisfaction.     Quay Burow

## 2018-01-25 NOTE — Progress Notes (Signed)
Site area: Right groin a5 french arterial and a 7 french venous sheath was removed by Eben Burow RCIS  Site Prior to Removal:  Level 0  Pressure Applied For 20 MINUTES    Bedrest Beginning at 1635pm  Manual:   Yes.    Patient Status During Pull:  stable  Post Pull Groin Site:  Level 0  Post Pull Instructions Given:  Yes.    Post Pull Pulses Present:  Yes.    Dressing Applied:  Yes.    Comments:  VS remain stable

## 2018-01-26 ENCOUNTER — Encounter (HOSPITAL_COMMUNITY): Payer: Self-pay | Admitting: Cardiovascular Disease

## 2018-01-26 DIAGNOSIS — K551 Chronic vascular disorders of intestine: Secondary | ICD-10-CM | POA: Diagnosis not present

## 2018-01-26 DIAGNOSIS — J449 Chronic obstructive pulmonary disease, unspecified: Secondary | ICD-10-CM | POA: Diagnosis not present

## 2018-01-26 DIAGNOSIS — I350 Nonrheumatic aortic (valve) stenosis: Secondary | ICD-10-CM | POA: Diagnosis not present

## 2018-01-26 DIAGNOSIS — I714 Abdominal aortic aneurysm, without rupture: Secondary | ICD-10-CM | POA: Diagnosis not present

## 2018-01-26 DIAGNOSIS — I10 Essential (primary) hypertension: Secondary | ICD-10-CM | POA: Diagnosis not present

## 2018-01-26 DIAGNOSIS — E785 Hyperlipidemia, unspecified: Secondary | ICD-10-CM | POA: Diagnosis not present

## 2018-01-26 LAB — BASIC METABOLIC PANEL
Anion gap: 8 (ref 5–15)
BUN: 8 mg/dL (ref 8–23)
CO2: 24 mmol/L (ref 22–32)
Calcium: 9.5 mg/dL (ref 8.9–10.3)
Chloride: 109 mmol/L (ref 98–111)
Creatinine, Ser: 0.76 mg/dL (ref 0.61–1.24)
GFR calc Af Amer: >60 mL/min (ref >60)
Glucose, Bld: 99 mg/dL (ref 70–99)
POTASSIUM: 4.1 mmol/L (ref 3.5–5.1)
Sodium: 141 mmol/L (ref 135–145)

## 2018-01-26 LAB — CBC
HCT: 34.4 % — ABNORMAL LOW (ref 39.0–52.0)
Hemoglobin: 11.6 g/dL — ABNORMAL LOW (ref 13.0–17.0)
MCH: 33.1 pg (ref 26.0–34.0)
MCHC: 33.7 g/dL (ref 30.0–36.0)
MCV: 98.3 fL (ref 78.0–100.0)
PLATELETS: 106 10*3/uL — AB (ref 150–400)
RBC: 3.5 MIL/uL — AB (ref 4.22–5.81)
RDW: 14.1 % (ref 11.5–15.5)
WBC: 4.8 10*3/uL (ref 4.0–10.5)

## 2018-01-26 MED ORDER — ENSURE ENLIVE PO LIQD: 237.0000 mL | Freq: Two times a day (BID) | ORAL | 12 refills | Status: DC

## 2018-01-26 NOTE — Discharge Summary (Addendum)
Discharge Summary    Patient ID: Robert Schaefer,  MRN: [id number], DOB/AGE: 78-Nov-1942 78 y.o.  Admit date: 01/25/2018 Discharge date: 01/26/2018  Primary Care Provider: [name] Primary Cardiologist: Quay Burow, MD  Discharge Diagnoses    Active Problems:   Aortic stenosis, moderate   AS (aortic stenosis)   Allergies Allergies  Allergen Reactions  . Penicillins Anaphylaxis, Itching, Swelling and Other (See Comments)    Has patient had a PCN reaction causing immediate rash, facial/tongue/throat swelling, SOB or lightheadedness with hypotension: Yes Has patient had a PCN reaction causing severe rash involving mucus membranes or skin necrosis: No Has patient had a PCN reaction that required hospitalization: Yes Has patient had a PCN reaction occurring within the last 10 years: No If all of the above answers are "NO", then may proceed with Cephalosporin use.   . Sulfa Antibiotics Anaphylaxis and Swelling  . Versed [Midazolam] Other (See Comments)    Confusion, delerium    Diagnostic Studies/Procedures    CARDIAC CATHETERIZATION  01/25/18  Right Heart Pressures Hemodynamic findings consistent with aortic valve stenosis. Right atrial pressure- 6/4 Right ventricular pressure-34/1 Pulmonary artery pressure- 37/14, mean 22 Pulmonary wedge pressure- A-wave 18, V wave 17, mean 13 LVEDP- 23 Cardiac output by Fick- 5.3 L/min, thermodilution-8 L/min Aortic valve gradient- 50 mmHg Aortic valve area (Fick) 0.55 cm    IMPRESSION: Robert Schaefer has normal coronary arteries and critical aortic stenosis.  His iliac stents are widely patent.  Given his age and elevated blood pressure I think it is prudent to keep him overnight for observation.  He will be gently hydrated and discharged home in the morning.  I will refer him to  TCT S for surgical evaluation for aortic valve replacement.  The sheath were removed and pressure held in the groin to achieve hemostasis.  The patient  left the lab in stable condition.  Quay Burow. MD, [facility] 01/25/2018 _____________   History of Present Illness    Robert Schaefer a 77 y.o.male with small AAA, <50% carotid dz, COPD, GERD, HLD, HTN, IBS, hypothyroid, PAD w/ bilat iliac kissing stents, greater than 50% bilateral iliac disease 04/2017 ultrasound, normal Myoview 2017. Cardiovascular risk factor profile notable for 100 pack years of tobacco abuse having quit 10 years ago.There is a family history of heart disease with a mother who died of a myocardial infarction age 68. He has never had a heart attack or stroke. He has had recent chest pain with a CT angiogram that ruled out pulmonary embolus but did show diffuse coronary calcification. An abdominal pelvic CT performed because of unexplained weight loss showed aortoiliac calcification size/atherosclerosis as well as atherosclerosis of the superior mesenteric artery and left renal artery. He complains of right greater than leftleg claudication of the last year occurring after walking 4-5 minutes.   A Myoview stress test 03/2016 was nonischemic problem, a 2-D echo in 03/2016 showed normal LV function with moderate aortic stenosis, carotid Dopplers that showed no evidence of ICA stenosis and lower extremity Dopplers reveal a right ABI of 0.48 with an occluded right common iliac and a left ABI of 1.23 with moderate left common iliac artery stenosis. He also complains a 30 pound weight loss of and had SMA calcification on abdominal CT. He may have mesenteric ischemia as well.He underwent peripheral angiography and intervention 05/29/16 demonstrating a small abdominal aortic aneurysm, 95% calcified ostial superior mesenteric artery stenosis, right common iliac chronic total occlusion was calcified and high-grade common iliac artery  stenosis. He underwent diamondback orbital rotational atherectomy, PTA and covered stenting using Lifestream covered stents" of both common iliac arteries using  "kissing stent technique".  He was seen for preop evaluation on 12/30/17 prior to planned Nissen fundoplication.A 2D echo was performed 01/01/2018 and showed critical aortic stenosis with a valve area 0.42 cm and a peak aortic gradient of 120 mmHg. He was arranged for elective outpatient right and left heart cath to define his anatomy and physiology.  Hospital Course     Consultants: None  Mr. Arnaud underwent R&L heart catheterization and abdominal aortogram on 01/25/18 which revealed normal coronary arteries and critical aortic stenosis (see above report). His iliac stents are widely patent.  Given his age and elevated blood pressure he was kept overnight for observation and gentle hydration.  He will be referred to  TCTS for surgical evaluation for aortic valve replacement. (message sent to [employer]).   Blood pressures improved overnight.  Morning labs included K+ 4.1, SCr 0.76, Hgb 11.6. Right groin cath site is stable.   _____________  Discharge Vitals Blood pressure (!) 133/42, pulse 64, temperature 98.7 F (37.1 C), temperature source Oral, resp. rate 18, height 5\' 5"  (1.651 m), weight 135 lb 11.2 oz (61.6 kg), SpO2 94 %.  Filed Weights   01/25/18 1055 01/26/18 0326  Weight: 139 lb (63 kg) 135 lb 11.2 oz (61.6 kg)   Physical Exam  Constitutional: He is oriented to person, place, and time. No distress.  Elderly male  HENT:  Head: Normocephalic and atraumatic.  Neck: Normal range of motion. Neck supple. No JVD present.  Cardiovascular: Normal rate and regular rhythm.  Murmur heard.  Harsh midsystolic murmur is present with a grade of 3/6 radiating to the neck. Pulmonary/Chest: Effort normal and breath sounds normal. No respiratory distress. He has no wheezes. He has no rales.  Abdominal: Soft. Bowel sounds are normal.  Musculoskeletal: Normal range of motion. He exhibits no edema or deformity.  Neurological: He is alert and oriented to person, place, and time.  Skin: Skin is  warm and dry.  Psychiatric: He has a normal mood and affect. His behavior is normal. Thought content normal.    Labs & Radiologic Studies    CBC Recent Labs    01/26/18 0331  WBC 4.8  HGB 11.6*  HCT 34.4*  MCV 98.3  PLT 130*   Basic Metabolic Panel Recent Labs    01/26/18 0331  NA 141  K 4.1  CL 109  CO2 24  GLUCOSE 99  BUN 8  CREATININE 0.76  CALCIUM 9.5   Liver Function Tests No results for input(s): AST, ALT, ALKPHOS, BILITOT, PROT, ALBUMIN in the last 72 hours. No results for input(s): LIPASE, AMYLASE in the last 72 hours. Cardiac Enzymes No results for input(s): CKTOTAL, CKMB, CKMBINDEX, TROPONINI in the last 72 hours. BNP Invalid input(s): POCBNP D-Dimer No results for input(s): DDIMER in the last 72 hours. Hemoglobin A1C No results for input(s): HGBA1C in the last 72 hours. Fasting Lipid Panel No results for input(s): CHOL, HDL, LDLCALC, TRIG, CHOLHDL, LDLDIRECT in the last 72 hours. Thyroid Function Tests No results for input(s): TSH, T4TOTAL, T3FREE, THYROIDAB in the last 72 hours.  Invalid input(s): FREET3 _____________  No results found. Disposition   Pt is being discharged home today in good condition.  Follow-up Plans & Appointments    Follow-up Information    Lendon Colonel, NP Follow up.   Specialties:  Nurse Practitioner, Radiology, Cardiology Why:  Cardiology hospital  follow up on August 8th at 9:30. Please arrive 15 minutes early for check in.  Contact information: [address] [place] [address] [phone]          Discharge Instructions    Diet - low sodium heart healthy   Complete by:  As directed    Discharge instructions   Complete by:  As directed    PLEASE REMEMBER TO BRING ALL OF YOUR MEDICATIONS TO EACH OF YOUR FOLLOW-UP OFFICE VISITS.  PLEASE ATTEND ALL SCHEDULED FOLLOW-UP APPOINTMENTS.   Activity: Increase activity slowly as tolerated. You may shower, but no soaking baths (or swimming)  for 1 week. No driving for 24 hours. No lifting over 5 lbs for 1 week. No sexual activity for 1 week.   You May Return to Work: in 1 week (if applicable)  Wound Care: You may wash cath site gently with soap and water. Keep cath site clean and dry. If you notice pain, swelling, bleeding or pus at your cath site, please call [phone].   Increase activity slowly   Complete by:  As directed       Discharge Medications   Allergies as of 01/26/2018      Reactions   Penicillins Anaphylaxis, Itching, Swelling, Other (See Comments)   Has patient had a PCN reaction causing immediate rash, facial/tongue/throat swelling, SOB or lightheadedness with hypotension: Yes Has patient had a PCN reaction causing severe rash involving mucus membranes or skin necrosis: No Has patient had a PCN reaction that required hospitalization: Yes Has patient had a PCN reaction occurring within the last 10 years: No If all of the above answers are "NO", then may proceed with Cephalosporin use.   Sulfa Antibiotics Anaphylaxis, Swelling   Versed [midazolam] Other (See Comments)   Confusion, delerium      Medication List    TAKE these medications   albuterol 108 (90 Base) MCG/ACT inhaler Commonly known as:  PROVENTIL HFA;VENTOLIN HFA Inhale 1 puff into the lungs every 6 (six) hours as needed for wheezing or shortness of breath.   alfuzosin 10 MG 24 hr tablet Commonly known as:  UROXATRAL Take 1 tablet (10 mg total) by mouth daily with breakfast.   aspirin EC 81 MG tablet Take 81 mg by mouth daily.   clopidogrel 75 MG tablet Commonly known as:  PLAVIX Take 75 mg daily by mouth.   feeding supplement (ENSURE ENLIVE) Liqd Take 237 mLs by mouth 2 (two) times daily between meals.   fluticasone 50 MCG/ACT nasal spray Commonly known as:  FLONASE Place 2 sprays into both nostrils daily.   HYDROcodone-acetaminophen 5-325 MG tablet Commonly known as:  NORCO/VICODIN Take 1 tablet by mouth every 4 (four) hours  as needed for moderate pain.   levothyroxine 50 MCG tablet Commonly known as:  SYNTHROID, LEVOTHROID TAKE 1 TABLET BY MOUTH ONCE DAILY   Vitamin D3 1000 units Caps Take 7,000 Units by mouth daily.        Acute coronary syndrome (MI, NSTEMI, STEMI, etc) this admission?: No.    Outstanding Labs/Studies   BMet at follow up   Duration of Discharge Encounter   Greater than 30 minutes including physician time.  Signed, Daune Perch, NP 01/26/2018, 9:15 AM   History and all data above reviewed.  Patient examined.  I agree with the findings as above.   No complaints overnight.   The patient exam reveals COR:RRR, systolic murmur  ,  Lungs: Clear  ,  Abd: Positive bowel sounds, no rebound  no guarding, Ext No edema, right femoral with bruit but no bruising or pulsatile mass.    .  All available labs, radiology testing, previous records reviewed. Agree with documented assessment and plan. AS:  Ok to go home.  Follow with TCT for surgical opinion.  Meds as listed.   Daune Perch  9:15 AM  01/26/2018

## 2018-02-04 ENCOUNTER — Ambulatory Visit: Payer: Medicare Other | Admitting: Adult Health

## 2018-02-10 ENCOUNTER — Encounter: Payer: Self-pay | Admitting: Physician Assistant

## 2018-02-10 ENCOUNTER — Institutional Professional Consult (permissible substitution) (INDEPENDENT_AMBULATORY_CARE_PROVIDER_SITE_OTHER): Payer: Medicare Other | Admitting: Surgery

## 2018-02-10 VITALS — BP 130/57 | HR 84 | Resp 18 | Ht 65.0 in | Wt 137.0 lb

## 2018-02-10 DIAGNOSIS — I350 Nonrheumatic aortic (valve) stenosis: Secondary | ICD-10-CM

## 2018-02-10 NOTE — Progress Notes (Signed)
Patient ID: Robert Schaefer, male   DOB: 1940-11-29, 77 y.o.   MRN: [id number]   [place] [facility]GERY CONSULTATION REPORT  Referring Provider is [name], MD PCP is [name], MD  Chief Complaint  Patient presents with  . Aortic Stenosis    Surgical eval, Cardiac Cath 01/25/18, ECHO 01/01/18,     HPI:  The patient is a 77 year old gentleman with a history of hypertension, hyperlipidemia, hypothyroidism, obstructive sleep apnea intolerant to CPAP due to claustrophobia, previous heavy smoking with COPD, prostate cancer status diagnosed in 2018 status post XRT, aortoiliac vascular disease status post bilateral common iliac artery stenting, and aortic stenosis.  Over the past year or so the patient has had significant swallowing difficulty and was diagnosed with achalasia.  He has lost about 50 pounds over the past year.  He was seen by Dr. Johney Maine and was felt that he would require surgical treatment but required cardiac clearance.  He was evaluated by Dr. [name] and had an echocardiogram on 01/01/2018 which showed a severely calcified aortic valve with a mean gradient of 59 mmHg and a peak gradient of 120 mmHg.  Dimensionless index was 0.23.  Left ventricular ejection fraction was 60 to 65% with grade 1 diastolic dysfunction.  He underwent cardiac catheterization on 01/25/2018 which showed no significant coronary disease.  The aortic valve gradient was 50 mmHg.  Aortic valve area was 0.55 cm.  LVEDP was 23.  PA pressure was 37/14 with a mean wedge pressure of 13.  Bilateral iliac artery stents were patent.  The patient is here today with his wife and daughter.  He says that he has not been very active because he gets tired easily and has no energy.  He has shortness of breath with any significant exertion and occasionally has shortness of breath at night.  He has frequent episodes of dizziness when he stands up but is never  had any syncope.  He denies peripheral edema.  He occasionally has some brief chest pain but only last for a few seconds.  Past Medical History:  Diagnosis Date  . AAA (abdominal aortic aneurysm) ([facility])    found by berry at periphral vascular catherization w/ stenting 05-29-2016--- duplex 06-09-2016 distal AAA measures 3.0cm x 3.1cm  . Arthritis    "maybe in my hands" (01/25/2018)  . Carotid stenosis, asymptomatic, bilateral    per duplex --- bilateral <50% ICA  . Chronic bronchitis ([place])   . COPD with emphysema [facility])    pulmologist-  dr [name]  . First degree heart block   . GERD (gastroesophageal reflux disease)   . HOH (hard of hearing)   . Hyperlipidemia    no meds normal presently  . Hypertension    pt stopped taking losartan apprx. April 2018 , per pt pcp instructed him to stop  . Hypothyroidism   . IBS (irritable bowel syndrome)    intermittant constipation  . Lesion of left lung    followed by dr [name] (pulmologist)  post infection scarring  . Mild dementia   . OSA (obstructive sleep apnea)    has a cpap-  intolerant; due to Claustaphobia (01/25/2018)  . Peripheral vascular disease [facility])    cardiologist-- dr berry-- per last ABI/Aorta/Iliac 06-09-2016  patent bilateral iliac kissing stents, >50% stenosis bilateral iliac artery (R>L) and left external iliac artery, patent IVC  . Prostate cancer [facility]) urologist-  dr [name]  oncologist-  dr [name]  dx 03/ 2018-- Stage T1c, Gleason 4+5, PSA 8.15, vol 36cc--- currently ADT and planned external radiation  . RLS (restless legs syndrome)   . Schatzki's ring   . Wears partial dentures    upper    Past Surgical History:  Procedure Laterality Date  . ABDOMINAL AORTOGRAM N/A 01/25/2018   Procedure: ABDOMINAL AORTOGRAM;  Surgeon: [name], MD;  Location: [place] [facility];  Service: Cardiovascular;  Laterality: N/A;  . ANAL FISTULECTOMY  2010  . CARDIAC CATHETERIZATION Bilateral 01/25/2018  . CARDIOVASCULAR STRESS TEST   04-23-2016   dr berry   normal nuclear study w/ no ischemia/  normal LV function and wall function, nuclear stress ef 57%  . CATARACT EXTRACTION W/ INTRAOCULAR LENS  IMPLANT, BILATERAL Bilateral   . COLONOSCOPY    . ESOPHAGEAL MANOMETRY N/A 10/12/2017   Procedure: ESOPHAGEAL MANOMETRY (EM);  Surgeon: [name], MD;  Location: [facility];  Service: Gastroenterology;  Laterality: N/A;  . ESOPHAGOGASTRODUODENOSCOPY N/A 10/12/2017   Procedure: ESOPHAGOGASTRODUODENOSCOPY (EGD);  Surgeon: [name], MD;  Location: [facility];  Service: Gastroenterology;  Laterality: N/A;  . ESOPHAGOGASTRODUODENOSCOPY (EGD) WITH ESOPHAGEAL DILATION  last one 11-09-2015  dr Yolanda Bonine   dilation schatzki's ring  . GOLD SEED IMPLANT N/A 04/29/2017   Procedure: GOLD SEED IMPLANT;  Surgeon: [name], MD;  Location: [facility];  Service: Urology;  Laterality: N/A;  . HERNIA REPAIR    . INGUINAL HERNIA REPAIR Right 02/23/2014   Procedure: HERNIA REPAIR INGUINAL ADULT;  Surgeon: Joyice Faster. Cornett, MD;  Location: [place];  Service: General;  Laterality: Right;  . INSERTION OF MESH Right 02/23/2014   Procedure: INSERTION OF MESH;  Surgeon: Joyice Faster. Cornett, MD;  Location: [place];  Service: General;  Laterality: Right;  . PERIPHERAL VASCULAR CATHETERIZATION N/A 05/29/2016   Procedure: Lower Extremity Angiography;  Surgeon: [name], MD;  Location: [place] [facility];  Service: Cardiovascular;  Laterality: N/A; [place] orbital rotational atherectomy, PTA and stenting bilateral common iliac arteries   . PILONIDAL CYST / SINUS EXCISION  2000  . PROSTATE BIOPSY  09-22-2016  dr [name] office  . RIGHT/LEFT HEART CATH AND CORONARY ANGIOGRAPHY N/A 01/25/2018   Procedure: RIGHT/LEFT HEART CATH AND CORONARY ANGIOGRAPHY;  Surgeon: [name], MD;  Location: [place] [facility];  Service: Cardiovascular;  Laterality: N/A;  . SAVORY DILATION N/A  10/12/2017   Procedure: SAVORY DILATION;  Surgeon: [name], MD;  Location: [facility];  Service: Gastroenterology;  Laterality: N/A;  . SPACE OAR INSTILLATION N/A 04/29/2017   Procedure: SPACE OAR INSTILLATION;  Surgeon: [name], MD;  Location: [facility];  Service: Urology;  Laterality: N/A;  . TRANSTHORACIC ECHOCARDIOGRAM  05-05-2016   dr berry   ef 01-75%,  grade 1diastolic dysfunction/  moderate AV stenosis with mod. AR (right coronary and noncoronary cusp mobility seversly restricted);  valve area 1.25cm^2, mean gradient 27 mmHg, peak gradient 12mmHg/  mild MR  . UPPER GASTROINTESTINAL ENDOSCOPY      Family History  Problem Relation Age of Onset  . Heart attack Mother   . Stroke Father   . Asthma Father   . Cancer Neg Hx   . Colon cancer Neg Hx   . Stomach cancer Neg Hx   . Esophageal cancer Neg Hx   . Rectal cancer Neg Hx   . Pancreatic cancer Neg Hx   . Prostate cancer Neg Hx     Social History  Socioeconomic History  . Marital status: Married    Spouse name: Not on file  . Number of children: Not on file  . Years of education: Not on file  . Highest education level: Not on file  Occupational History  . Not on file  Social Needs  . Financial resource strain: Not on file  . Food insecurity:    Worry: Not on file    Inability: Not on file  . Transportation needs:    Medical: Not on file    Non-medical: Not on file  Tobacco Use  . Smoking status: Former Smoker    Packs/day: 1.00    Years: 50.00    Pack years: 50.00    Types: Cigarettes    Last attempt to quit: 07/05/2005    Years since quitting: 12.6  . Smokeless tobacco: Never Used  Substance and Sexual Activity  . Alcohol use: No  . Drug use: Never  . Sexual activity: Not Currently  Lifestyle  . Physical activity:    Days per week: Not on file    Minutes per session: Not on file  . Stress: Not on file  Relationships  . Social connections:    Talks on phone: Not on  file    Gets together: Not on file    Attends religious service: Not on file    Active member of club or organization: Not on file    Attends meetings of clubs or organizations: Not on file    Relationship status: Not on file  . Intimate partner violence:    Fear of current or ex partner: Not on file    Emotionally abused: Not on file    Physically abused: Not on file    Forced sexual activity: Not on file  Other Topics Concern  . Not on file  Social History Narrative  . Not on file    Current Outpatient Medications  Medication Sig Dispense Refill  . albuterol (PROVENTIL HFA;VENTOLIN HFA) 108 (90 Base) MCG/ACT inhaler Inhale 1 puff into the lungs every 6 (six) hours as needed for wheezing or shortness of breath.    . alfuzosin (UROXATRAL) 10 MG 24 hr tablet Take 1 tablet (10 mg total) by mouth daily with breakfast. 30 tablet 0  . aspirin EC 81 MG tablet Take 81 mg by mouth daily.    . Cholecalciferol (VITAMIN D3) 1000 units CAPS Take 7,000 Units by mouth daily.     . clopidogrel (PLAVIX) 75 MG tablet Take 75 mg daily by mouth.    . feeding supplement, ENSURE ENLIVE, (ENSURE ENLIVE) LIQD Take 237 mLs by mouth 2 (two) times daily between meals. 237 mL 12  . fluticasone (FLONASE) 50 MCG/ACT nasal spray Place 2 sprays into both nostrils daily. 16 g 3  . HYDROcodone-acetaminophen (NORCO/VICODIN) 5-325 MG tablet Take 1 tablet by mouth every 4 (four) hours as needed for moderate pain.  0  . levothyroxine (SYNTHROID, LEVOTHROID) 50 MCG tablet TAKE 1 TABLET BY MOUTH ONCE DAILY 90 tablet 1   No current facility-administered medications for this visit.     Allergies  Allergen Reactions  . Penicillins Anaphylaxis, Itching, Swelling and Other (See Comments)    Has patient had a PCN reaction causing immediate rash, facial/tongue/throat swelling, SOB or lightheadedness with hypotension: Yes Has patient had a PCN reaction causing severe rash involving mucus membranes or skin necrosis: No Has  patient had a PCN reaction that required hospitalization: Yes Has patient had a PCN reaction occurring within the last 10  years: No If all of the above answers are "NO", then may proceed with Cephalosporin use.   . Sulfa Antibiotics Anaphylaxis and Swelling  . Versed [Midazolam] Other (See Comments)    Confusion, delerium      Review of Systems:   General:  decreased appetite, decreased energy, no weight gain, + weight loss, no fever  Cardiac:  + chest pain with exertion, no chest pain at rest, + SOB with  exertion, + resting SOB, no PND, + orthopnea, no palpitations, no arrhythmia, no atrial fibrillation, no LE edema, + dizzy spells, no syncope  Respiratory:  + shortness of breath, no home oxygen, no productive cough, no dry cough, no bronchitis, no wheezing, no hemoptysis, no asthma, no pain with inspiration or cough, + sleep apnea, does not use CPAP at night  GI:   + difficulty swallowing, + reflux, no frequent heartburn, + hiatal hernia, no abdominal pain, + constipation, no diarrhea, no hematochezia, no hematemesis, no melena  GU:   no dysuria,  + frequency, no urinary tract infection, no hematuria, no enlarged prostate, no kidney stones, no kidney disease  Vascular:  + pain suggestive of claudication, no pain in feet, no leg cramps, no varicose veins, no DVT, no non-healing foot ulcer  Neuro:   no stroke, no TIA's, no seizures, no headaches, no temporary blindness one eye,  + slurred speech, no peripheral neuropathy, no chronic pain, + instability of gait, + memory/cognitive dysfunction  Musculoskeletal: no arthritis, no joint swelling, no myalgias, some difficulty walking, reduced mobility   Skin:   no rash, no itching, no skin infections, no pressure sores or ulcerations  Psych:   no anxiety, no depression, no nervousness, no unusual recent stress  Eyes:   + blurry vision, + floaters, + recent vision changes,  wears glasses   ENT:   + hearing loss, edentulous, no dentures,    Hematologic:  no easy bruising, no abnormal bleeding, no clotting disorder, no frequent epistaxis  Endocrine:  no diabetes, does not check CBG's at home       Physical Exam:   BP (!) 130/57   Pulse 84   Resp 18   Ht 5\' 5"  (1.651 m)   Wt 137 lb (62.1 kg)   SpO2 96% Comment: RA  BMI 22.80 kg/m   General:  Elderly, frail-appearing  HEENT:  Unremarkable, NCAT, PERLA, EOMI, oropharynx clear, edentulous  Neck:   no JVD, no bruits, transmitted murmur to both sides of neck, no adenopathy or thyromegaly  Chest:   clear to auscultation, symmetrical breath sounds, no wheezes, no rhonchi   CV:   RRR, grade III/VI crescendo/decrescendo murmur heard best at RSB, II/VI diastolic murmur LLSB  Abdomen:  soft, non-tender, no masses or organomegaly  Extremities:  warm, well-perfused, pulses not palpable in feet, no LE edema  Rectal/GU  Deferred  Neuro:   Grossly non-focal and symmetrical throughout  Skin:   Clean and dry, no rashes, no breakdown   Diagnostic Tests:      [facility]                        [address]                        [place], [place] [address]  [phone]  ------------------------------------------------------------------- Transthoracic Echocardiography  Patient:    Zackrey, Dyar MR #:       [id number] Study Date: 01/01/2018 Gender:     M Age:        28 Height:     165.1 cm Weight:     61.2 kg BSA:        1.68 m^2 Pt. Status: Room:   ATTENDING    [name], M.D.  ORDERING     Barrett, Rhonda G  REFERRING    Barrett, Evelene Croon  SONOGRAPHER  Wyatt Mage, RDCS  PERFORMING   [facility], Outpatient  cc:  ------------------------------------------------------------------- LV EF: 60% -   65%  ------------------------------------------------------------------- Indications:      Aortic stenosis (I35).  ------------------------------------------------------------------- History:   PMH:   Syncope.  Aortic valve  disease.  Chronic obstructive pulmonary disease.  Risk factors:  PVD. OSA. Former tobacco use. Hypertension. Dyslipidemia.  ------------------------------------------------------------------- Study Conclusions  - Left ventricle: The cavity size was mildly dilated. Wall   thickness was normal. Systolic function was normal. The estimated   ejection fraction was in the range of 60% to 65%. Doppler   parameters are consistent with abnormal left ventricular   relaxation (grade 1 diastolic dysfunction). - Aortic valve: AV is severely calcified with motion of the left   cusp only. Gradients have increased significantly since last echo   mean 52 mmHg->64 mmHg and peak 85 mmHg -> 123 mmHG. There was   severe stenosis. There was moderate to severe regurgitation. Mean   gradient (S): 59 mm Hg. Peak gradient (S): 120 mm Hg. - Left atrium: The atrium was mildly dilated. - Atrial septum: No defect or patent foramen ovale was identified.  ------------------------------------------------------------------- Labs, prior tests, procedures, and surgery: Transthoracic echocardiography (04/28/2017).    The aortic valve showed moderate to severe stenosis.  EF was 60%. Aortic valve: peak gradient of 85 mm Hg and mean gradient of 52 mm Hg.  ------------------------------------------------------------------- Study data:  Comparison was made to the study of 04/28/2017.  Study status:  Routine.  Procedure:  The patient reported no pain pre or post test. Transthoracic echocardiography. Image quality was adequate.  Study completion:  There were no complications. Transthoracic echocardiography.  M-mode, complete 2D, spectral Doppler, and color Doppler.  Birthdate:  Patient birthdate: July 21, 1940.  Age:  Patient is 77 yr old.  Sex:  Gender: male. BMI: 22.5 kg/m^2.  Blood pressure:     134/62  Patient status: Outpatient.  Study date:  Study date: 01/01/2018. Study time: 03:37 PM.  Location:  [facility]  -------------------------------------------------------------------  ------------------------------------------------------------------- Left ventricle:  The cavity size was mildly dilated. Wall thickness was normal. Systolic function was normal. The estimated ejection fraction was in the range of 60% to 65%. Doppler parameters are consistent with abnormal left ventricular relaxation (grade 1 diastolic dysfunction).  ------------------------------------------------------------------- Aortic valve:  AV is severely calcified with motion of the left cusp only. Gradients have increased significantly since last echo mean 52 mmHg->64 mmHg and peak 85 mmHg -> 123 mmHG.  Trileaflet; severely thickened, severely calcified leaflets.  Doppler:   There was severe stenosis.   There was moderate to severe regurgitation.   VTI ratio of LVOT to aortic valve: 0.24. Valve area (VTI): 0.77 cm^2. Indexed valve area (VTI): 0.46 cm^2/m^2. Peak velocity ratio of LVOT to aortic valve: 0.23. Valve area (Vmax): 0.71 cm^2. Indexed valve area (Vmax): 0.42 cm^2/m^2. Mean velocity ratio of LVOT to aortic valve: 0.24. Valve area (Vmean): 0.77 cm^2.  Indexed valve area (Vmean): 0.46 cm^2/m^2.    Mean gradient (S): 59 mm Hg. Peak gradient (S): 120 mm Hg.  ------------------------------------------------------------------- Aorta:  The aorta was normal, not dilated, and non-diseased.  ------------------------------------------------------------------- Mitral valve:   Mildly thickened leaflets .  Doppler:  There was trivial regurgitation.    Valve area by pressure half-time: 2.53 cm^2. Indexed valve area by pressure half-time: 1.51 cm^2/m^2.  ------------------------------------------------------------------- Left atrium:  The atrium was mildly dilated.  ------------------------------------------------------------------- Atrial septum:  No defect or patent foramen ovale was identified.    ------------------------------------------------------------------- Right ventricle:  The cavity size was normal. Wall thickness was normal. Systolic function was normal.  ------------------------------------------------------------------- Pulmonic valve:    Doppler:  There was mild regurgitation.  ------------------------------------------------------------------- Tricuspid valve:   Doppler:  There was mild regurgitation.  ------------------------------------------------------------------- Right atrium:  The atrium was normal in size.  ------------------------------------------------------------------- Pericardium:  The pericardium was normal in appearance.   ------------------------------------------------------------------- Post procedure conclusions Ascending Aorta:  - The aorta was normal, not dilated, and non-diseased.  ------------------------------------------------------------------- Measurements   Left ventricle                            Value          Reference  LV ID, ED, PLAX chordal                   44    mm       43 - 52  LV ID, ES, PLAX chordal                   32    mm       23 - 38  LV fx shortening, PLAX chordal    (L)     27    %        >=29  LV PW thickness, ED                       11    mm       ---------  IVS/LV PW ratio, ED                       0.91           <=1.3  Stroke volume, 2D                         96    ml       ---------  Stroke volume/bsa, 2D                     57    ml/m^2   ---------  LV e&', lateral                            7.97  cm/s     ---------  LV E/e&', lateral                          4.93           ---------  LV e&', medial                             5.98  cm/s     ---------  LV E/e&', medial  6.57           ---------  LV e&', average                            6.98  cm/s     ---------  LV E/e&', average                          5.63           ---------    Ventricular septum                         Value          Reference  IVS thickness, ED                         10    mm       ---------    LVOT                                      Value          Reference  LVOT ID, S                                20    mm       ---------  LVOT area                                 3.14  cm^2     ---------  LVOT peak velocity, S                     124   cm/s     ---------  LVOT mean velocity, S                     87.5  cm/s     ---------  LVOT VTI, S                               30.7  cm       ---------  LVOT peak gradient, S                     6     mm Hg    ---------    Aortic valve                              Value          Reference  Aortic valve peak velocity, S             547   cm/s     ---------  Aortic valve mean velocity, S             358   cm/s     ---------  Aortic valve VTI, S                       126   cm       ---------  Aortic  mean gradient, S                   59    mm Hg    ---------  Aortic peak gradient, S                   120   mm Hg    ---------  VTI ratio, LVOT/AV                        0.24           ---------  Aortic valve area, VTI                    0.77  cm^2     ---------  Aortic valve area/bsa, VTI                0.46  cm^2/m^2 ---------  Velocity ratio, peak, LVOT/AV             0.23           ---------  Aortic valve area, peak velocity          0.71  cm^2     ---------  Aortic valve area/bsa, peak               0.42  cm^2/m^2 ---------  velocity  Velocity ratio, mean, LVOT/AV             0.24           ---------  Aortic valve area, mean velocity          0.77  cm^2     ---------  Aortic valve area/bsa, mean               0.46  cm^2/m^2 ---------  velocity  Aortic regurg pressure half-time          323   ms       ---------    Aorta                                     Value          Reference  Aortic root ID, ED                        34    mm       ---------    Left atrium                               Value          Reference  LA ID, A-P, ES                             38    mm       ---------  LA ID/bsa, A-P                    (H)     2.26  cm/m^2   <=2.2  LA volume, S                              39.2  ml       ---------  LA volume/bsa, S  23.3  ml/m^2   ---------  LA volume, ES, 1-p A4C                    32.3  ml       ---------  LA volume/bsa, ES, 1-p A4C                19.2  ml/m^2   ---------  LA volume, ES, 1-p A2C                    47.8  ml       ---------  LA volume/bsa, ES, 1-p A2C                28.5  ml/m^2   ---------    Mitral valve                              Value          Reference  Mitral E-wave peak velocity               39.3  cm/s     ---------  Mitral A-wave peak velocity               92.6  cm/s     ---------  Mitral deceleration time          (H)     296   ms       150 - 230  Mitral pressure half-time                 87    ms       ---------  Mitral E/A ratio, peak                    0.4            ---------  Mitral valve area, PHT, DP                2.53  cm^2     ---------  Mitral valve area/bsa, PHT, DP            1.51  cm^2/m^2 ---------    Systemic veins                            Value          Reference  Estimated CVP                             3     mm Hg    ---------    Right ventricle                           Value          Reference  TAPSE                                     19.8  mm       ---------  RV s&', lateral, S                         10.6  cm/s     ---------    Pulmonic valve  Value          Reference  Pulmonic regurg velocity, ED              150   cm/s     ---------  Pulmonic regurg gradient, ED              9     mm Hg    ---------  Legend: (L)  and  (H)  mark values outside specified reference range.  ------------------------------------------------------------------- Prepared and Electronically Authenticated by  [name], M.D. 2019-07-05T17:17:57   TKAI SERFASS is a 78 y.o. male    [id number] LOCATION:   FACILITY: [facility]  PHYSICIAN: Quay Burow, M.D. 1940/10/06   DATE OF PROCEDURE:  01/25/2018  DATE OF DISCHARGE:     CARDIAC CATHETERIZATION     History obtained from chart review.Ricke Priceis a 77 y.o.married Caucasian male father of 2 children, grandfather 5 grandchildren who is covering by his wife [name] He was referred by Dr. Melford Aase for cardiovascular evaluation because of chest pain and claudication. I last saw him in the office3/20/2018. His cardiovascular risk factor profile is notable for 100 pack years of tobacco abuse having quit 10 years ago. He has treated hypertension and hyperlipidemia. There is a family history of heart disease with a mother who died of a myocardial infarction age 41. He has never had a heart attack or stroke. He has had recent chest pain with a CT angiogram that ruled out pulmonary embolus but did show diffuse coronary calcification. An abdominal pelvic CT performed because of unexplained weight loss showed aortoiliac calcification size/atherosclerosis as well as atherosclerosis of the superior mesenteric artery and left renal artery. He complains of right greater than leftleg claudication of the last year occurring after walking 4-5 minutes. Since I saw him in the office he's had a Myoview stress test which was nonischemic problem, a 2-D echo that showed normal LV function with moderate aortic stenosis, carotid Dopplers that showed no evidence of ICA stenosis and lower extremity Dopplers reveal a right ABI of 0.48 with an occluded right common iliac and a left ABI of 1.23 with moderate left common iliac artery stenosis. He also complains a 30 pound weight loss of and had SMA calcification on abdominal CT. He may have mesenteric ischemia as well.He underwent peripheral angiography and intervention by myself 05/29/16 demonstrating a small abdominal aortic aneurysm, 95% calcified ostial superior mesenteric artery stenosis, right common iliac chronic  total occlusion was calcified and high-grade common iliac artery stenosis. He underwent diamondback orbital rotational atherectomy, PTA and covered stenting using Lifestream covered stents" of both common iliac arteries using "kissing stent technique. His claudication symptoms have resolved. The ABIs are normal. She does have a high-frequency signal in his right common iliac artery demonstrated a duplex ultrasound 06/09/16 was asymptomatic from this.  Since I saw him last year he is done well. He does now have achalasia and is unable to eat and is losing weight. He has had syncope in the past as well as generalized fatigue. Recent 2D echo performed 01/01/2018 showed critical aortic stenosis with a valve area 0.42 cm and a peak aortic gradient of 120 mmHg.  He presents today for elective outpatient right and left heart cath to define his anatomy and physiology.    IMPRESSION: Mr. Halbert has normal coronary arteries and critical aortic stenosis.  His iliac stents are widely patent.  Given his age and elevated blood pressure I think it is prudent to keep him overnight for  observation.  He will be gently hydrated and discharged home in the morning.  I will refer him to  TCT S for surgical evaluation for aortic valve replacement.  The sheath were removed and pressure held in the groin to achieve hemostasis.  The patient left the lab in stable condition.  Quay Burow. MD, [facility] 01/25/2018 3:41 PM        Indications   Critical aortic valve stenosis [I35.0 (ICD-10-CM)]  Procedural Details/Technique   Technical Details PROCEDURE DESCRIPTION:   The patient was brought to the second floor [facility] cath lab in the postabsorptive state. He was not premedicated . His groin was prepped and shaved in usual sterile fashion. Xylocaine 1% was used for local anesthesia. A 6 French sheath was inserted into the common femoral artery using standard Seldinger technique. A 7 French sheath was  inserted into the right common femoral vein. A 7 [place] balloontipped thermal dilution Swan-Ganz catheter was used to obtain sequential right heart pressures and obtain blood samples for the determination of Fick and thermal dilution cardiac outputs. 5 French right and left Judkins diagnostic catheters along with 5 French pigtail catheter were used for selective coronary angiography, crossing the aortic valve with a straight wire and right Judkins catheter and distal abdominal aortography. Isovue dye was used for the entirety of the case. Retrograde aorta, left ventricular and pullback pressures were recorded.   Estimated blood loss <50 mL.  During this procedure no sedation was administered.  Coronary Findings   Diagnostic  Dominance: Right  No diagnostic findings have been documented.  Intervention   No interventions have been documented.  Right Heart   Right Heart Pressures Hemodynamic findings consistent with aortic valve stenosis. Right atrial pressure- 6/4 Right ventricular pressure-34/1 Pulmonary artery pressure- 37/14, mean 22 Pulmonary wedge pressure- A-wave 18, V wave 17, mean 13 LVEDP- 23 Cardiac output by Fick- 5.3 L/min, thermodilution-8 L/min Aortic valve gradient- 50 mmHg Aortic valve area (Fick) 0.55 cm  Coronary Diagrams   Diagnostic Diagram       Implants    No implant documentation for this case.  MERGE Images   Show images for CARDIAC CATHETERIZATION   Link to Procedure Log   Procedure Log    Hemo Data    Most Recent Value  Fick Cardiac Output 5.31 L/min  Fick Cardiac Output Index 3.13 (L/min)/BSA  Thermal Cardiac Output 8 L/min  Thermal Cardiac Output Index 4.72 (L/min)/BSA  Aortic Mean Gradient 50.7 mmHg  Aortic Peak Gradient 48 mmHg  Aortic Valve Area 0.55  Aortic Value Area Index 0.32 cm2/BSA  RA A Wave 6 mmHg  RA V Wave 4 mmHg  RA Mean 3 mmHg  RV Systolic Pressure 34 mmHg  RV Diastolic Pressure 1 mmHg  RV EDP 7 mmHg  PA Systolic  Pressure 37 mmHg  PA Diastolic Pressure 14 mmHg  PA Mean 22 mmHg  PW A Wave 18 mmHg  PW V Wave 17 mmHg  PW Mean 13 mmHg  AO Systolic Pressure 154 mmHg  AO Diastolic Pressure 46 mmHg  AO Mean 89 mmHg  LV Systolic Pressure 008 mmHg  LV Diastolic Pressure 0 mmHg  LV EDP 23 mmHg  AOp Systolic Pressure 676 mmHg  AOp Diastolic Pressure 54 mmHg  AOp Mean Pressure 195 mmHg  LVp Systolic Pressure 093 mmHg  LVp Diastolic Pressure 0 mmHg  LVp EDP Pressure 19 mmHg  QP/QS 1  TPVR Index 7.03 HRUI  TSVR Index 28.4 HRUI  PVR SVR Ratio 0.1  TPVR/TSVR Ratio 0.25    STS Adult Cardiac Surgery Database Version 2.9 RISK SCORES Procedure: Isolated AVR CALCULATE  Risk of Mortality:  6.287%   Renal Failure:  4.314%   Permanent Stroke:  2.065%   Prolonged Ventilation:  16.170%   DSW Infection:  0.138%   Reoperation:  9.557%   Morbidity or Mortality:  24.490%   Short Length of Stay:  17.155%   Long Length of Stay:  14.062%    Impression:  This 77 year old gentleman has stage D, severe, symptomatic aortic stenosis and moderate to severe aortic insufficiency with New York Heart Association class III symptoms of exertional fatigue and shortness of breath consistent with chronic diastolic congestive heart failure.  I agree that aortic valve replacement is indicated in this patient for relief of his symptoms and to prevent left ventricular deterioration and worsening congestive heart failure.  I think his operative risk for open surgical aortic valve replacement would be at least moderate due to his multiple comorbid risk factors including his age, diffuse vascular disease, COPD, recent prostate cancer with XRT, achalasia with significant dysphasia and weight loss.  I have personally reviewed his echocardiogram, cardiac catheterization, and CT scan of the chest done 01/05/2017 to follow-up on a cavitary left upper lobe lung lesion.  His echocardiogram shows a trileaflet,severely calcified aortic valve with  slight motion of only the left cusp.  The mean gradient has increased to 59 mmHg consistent with severe aortic stenosis.  There is moderate to severe aortic insufficiency.  Left ventricular ejection fraction is normal at 60 to 65% with grade 1 diastolic dysfunction.  Cardiac catheterization shows no significant coronary disease and confirmed severe aortic stenosis with a mean gradient of 50 mmHg.  CT scan of the chest shows decreased size of the left upper lobe cavitary lung lesion suggesting that this is most likely postinfectious scarring.  He does have some calcification of the ascending aorta.  There is also extensive calcification of the aortic arch and branch vessels.  He has severe aortoiliac disease with bilateral common iliac artery stents which appear patent but is difficult to tell how large the lumen is.  I think it would be worthwhile doing a gated cardiac CTA as well as a CTA of the chest, abdomen, and pelvis to decide if transcatheter aortic valve replacement is an option for this patient.  I suspect that transfemoral insertion may not be possible due to his bilateral common iliac stents and residual disease there.  I am also concerned about access from the left axillary or left carotid artery due to significant calcified plaque at the takeoff of these vessels.  The patient and his wife and daughter were counseled at length regarding treatment alternatives for management of severe symptomatic aortic stenosis. The risks and benefits of surgical intervention has been discussed in detail. Long-term prognosis with medical therapy was discussed. Alternative approaches such as conventional surgical aortic valve replacement, transcatheter aortic valve replacement, and palliative medical therapy were compared and contrasted at length. This discussion was placed in the context of the patient's own specific clinical presentation and past medical history. All of their questions have been addressed.  He is in  agreement to proceed with further work-up and consideration of transcatheter aortic valve replacement.   Plan:  He will be scheduled for a gated cardiac CTA as well as a CTA of the chest, abdomen, and pelvis.  We will see him back after those been completed to review the results with him and decide if  transcatheter aortic valve replacement is an option for him.   I spent 60 minutes performing this consultation and > 50% of this time was spent face to face counseling and coordinating the care of this patient's severe symptomatic aortic stenosis.    Gaye Pollack, MD 02/10/2018

## 2018-02-10 NOTE — Progress Notes (Signed)
Cardiology Office Note   Date:  02/11/2018   ID:  [name], DOB 15-May-1941, MRN [id number]  PCP:  Robert Pinto, MD  Cardiologist: [facility]  Chief Complaint  Patient presents with  . Follow-up  . Headache     History of Present Illness: Robert Schaefer is a 77 y.o. male who presents for post hospital follow up with known history of small AAA, ,50% carotid artery disease, hyperlipidemia, hypertension, PAD with bilateral iliac kissing stents,, other hx of COPD, heavy smoking,  IBS, GERD and hypothyroidism.  He was found to have critical AoV stensosi with a valve area of 0.42 cm2 on echocardiogram performed on 01/01/2018, as part of a pre-operative evaluation. The patient was planned for Nissen fundoplication.   As a result of the abnormal echo, cardiac cath was performed on 01/25/2018 revealing normal coronaries and confirmed critical AoV stenosis. His iliac stents were widely patent. He was referred to [facility] for surgical evaluation for AoV replacement. He had appointment with [facility] on 02/10/2018.   He had appointment with Tiana Loft and was scheduled for a cardiac CTA and CTA of the chest with decision to be made concerning AoV repair.   He is without complaints from a cardiac standpoint. He has generalized fatigue. He is not very active now due to arthritis pain and mild dyspnea. He is also easily forgetful. He is medically compliant. His daughter helps him with his medications.    Past Medical History:  Diagnosis Date  . AAA (abdominal aortic aneurysm) ([place])    found by berry at periphral vascular catherization w/ stenting 05-29-2016--- duplex 06-09-2016 distal AAA measures 3.0cm x 3.1cm  . Arthritis    "maybe in my hands" (01/25/2018)  . Carotid stenosis, asymptomatic, bilateral    per duplex --- bilateral <50% ICA  . Chronic bronchitis ([place])   . COPD with emphysema [facility])    pulmologist-  dr [name]  . First degree heart block   . GERD (gastroesophageal reflux disease)   . HOH  (hard of hearing)   . Hyperlipidemia    no meds normal presently  . Hypertension    pt stopped taking losartan apprx. April 2018 , per pt pcp instructed him to stop  . Hypothyroidism   . IBS (irritable bowel syndrome)    intermittant constipation  . Lesion of left lung    followed by dr [name] (pulmologist)  post infection scarring  . Mild dementia   . OSA (obstructive sleep apnea)    has a cpap-  intolerant; due to Claustaphobia (01/25/2018)  . Peripheral vascular disease [facility])    cardiologist-- dr berry-- per last ABI/Aorta/Iliac 06-09-2016  patent bilateral iliac kissing stents, >50% stenosis bilateral iliac artery (R>L) and left external iliac artery, patent IVC  . Prostate cancer [facility]) urologist-  dr [name]  oncologist-  dr [name]   dx 03/ 2018-- Stage T1c, Gleason 4+5, PSA 8.15, vol 36cc--- currently ADT and planned external radiation  . RLS (restless legs syndrome)   . Schatzki's ring   . Wears partial dentures    upper    Past Surgical History:  Procedure Laterality Date  . ABDOMINAL AORTOGRAM N/A 01/25/2018   Procedure: ABDOMINAL AORTOGRAM;  Surgeon: [name], MD;  Location: [place] CV [facility];  Service: Cardiovascular;  Laterality: N/A;  . ANAL FISTULECTOMY  2010  . CARDIAC CATHETERIZATION Bilateral 01/25/2018  . CARDIOVASCULAR STRESS TEST  04-23-2016   dr berry   normal nuclear study w/ no ischemia/  normal LV function and  wall function, nuclear stress ef 57%  . CATARACT EXTRACTION W/ INTRAOCULAR LENS  IMPLANT, BILATERAL Bilateral   . COLONOSCOPY    . ESOPHAGEAL MANOMETRY N/A 10/12/2017   Procedure: ESOPHAGEAL MANOMETRY (EM);  Surgeon: [name], MD;  Location: [facility];  Service: Gastroenterology;  Laterality: N/A;  . ESOPHAGOGASTRODUODENOSCOPY N/A 10/12/2017   Procedure: ESOPHAGOGASTRODUODENOSCOPY (EGD);  Surgeon: [name], MD;  Location: [facility];  Service: Gastroenterology;  Laterality: N/A;  . ESOPHAGOGASTRODUODENOSCOPY (EGD)  WITH ESOPHAGEAL DILATION  last one 11-09-2015  dr Yolanda Bonine   dilation schatzki's ring  . GOLD SEED IMPLANT N/A 04/29/2017   Procedure: GOLD SEED IMPLANT;  Surgeon: [name], MD;  Location: [facility];  Service: Urology;  Laterality: N/A;  . HERNIA REPAIR    . INGUINAL HERNIA REPAIR Right 02/23/2014   Procedure: HERNIA REPAIR INGUINAL ADULT;  Surgeon: Joyice Faster. Cornett, MD;  Location: [place];  Service: General;  Laterality: Right;  . INSERTION OF MESH Right 02/23/2014   Procedure: INSERTION OF MESH;  Surgeon: Joyice Faster. Cornett, MD;  Location: [place];  Service: General;  Laterality: Right;  . PERIPHERAL VASCULAR CATHETERIZATION N/A 05/29/2016   Procedure: Lower Extremity Angiography;  Surgeon: [name], MD;  Location: [place] CV [facility];  Service: Cardiovascular;  Laterality: N/A; Diamondback orbital rotational atherectomy, PTA and stenting bilateral common iliac arteries   . PILONIDAL CYST / SINUS EXCISION  2000  . PROSTATE BIOPSY  09-22-2016  dr [name] office  . RIGHT/LEFT HEART CATH AND CORONARY ANGIOGRAPHY N/A 01/25/2018   Procedure: RIGHT/LEFT HEART CATH AND CORONARY ANGIOGRAPHY;  Surgeon: [name], MD;  Location: [place] [facility];  Service: Cardiovascular;  Laterality: N/A;  . SAVORY DILATION N/A 10/12/2017   Procedure: SAVORY DILATION;  Surgeon: [name], MD;  Location: [facility];  Service: Gastroenterology;  Laterality: N/A;  . SPACE OAR INSTILLATION N/A 04/29/2017   Procedure: SPACE OAR INSTILLATION;  Surgeon: [name], MD;  Location: [facility];  Service: Urology;  Laterality: N/A;  . TRANSTHORACIC ECHOCARDIOGRAM  05-05-2016   dr berry   ef 16-10%,  grade 1diastolic dysfunction/  moderate AV stenosis with mod. AR (right coronary and noncoronary cusp mobility seversly restricted);  valve area 1.25cm^2, mean gradient 27 mmHg, peak gradient 20mmHg/  mild MR  . UPPER  GASTROINTESTINAL ENDOSCOPY       Current Outpatient Medications  Medication Sig Dispense Refill  . albuterol (PROVENTIL HFA;VENTOLIN HFA) 108 (90 Base) MCG/ACT inhaler Inhale 1 puff into the lungs every 6 (six) hours as needed for wheezing or shortness of breath.    . alfuzosin (UROXATRAL) 10 MG 24 hr tablet Take 1 tablet (10 mg total) by mouth daily with breakfast. 30 tablet 0  . aspirin EC 81 MG tablet Take 81 mg by mouth daily.    . Cholecalciferol (VITAMIN D3) 1000 units CAPS Take 7,000 Units by mouth daily.     . clopidogrel (PLAVIX) 75 MG tablet Take 75 mg daily by mouth.    . feeding supplement, ENSURE ENLIVE, (ENSURE ENLIVE) LIQD Take 237 mLs by mouth 2 (two) times daily between meals. 237 mL 12  . fluticasone (FLONASE) 50 MCG/ACT nasal spray Place 2 sprays into both nostrils daily. 16 g 3  . HYDROcodone-acetaminophen (NORCO/VICODIN) 5-325 MG tablet Take 1 tablet by mouth every 4 (four) hours as needed for moderate pain.  0  . levothyroxine (SYNTHROID, LEVOTHROID) 50 MCG tablet TAKE 1 TABLET BY MOUTH ONCE DAILY 90  tablet 1   No current facility-administered medications for this visit.     Allergies:   Penicillins; Sulfa antibiotics; and Versed [midazolam]    Social History:  The patient  reports that he quit smoking about 12 years ago. His smoking use included cigarettes. He has a 50.00 pack-year smoking history. He has never used smokeless tobacco. He reports that he does not drink alcohol or use drugs.   Family History:  The patient's family history includes Asthma in his father; Heart attack in his mother; Stroke in his father.    ROS: All other systems are reviewed and negative. Unless otherwise mentioned in H&P    PHYSICAL EXAM: VS:  BP (!) 138/44 (BP Location: Left Arm, Patient Position: Sitting, Cuff Size: Normal)   Pulse 62   Ht 5\' 5"  (1.651 m)   Wt 137 lb (62.1 kg)   BMI 22.80 kg/m  , BMI Body mass index is 22.8 kg/m. GEN: Well nourished, well developed, in no  acute distress  HEENT: normal  Neck: no JVD, radiation carotid bruits, or masses Cardiac: RRR; harsh 3/6 holosystolic murmurs, rubs, or gallops,no edema  Respiratory:  clear to auscultation bilaterally, normal work of breathing GI: soft, nontender, nondistended, + BS, abdominal bruit noted.  MS: no deformity or atrophy  Skin: warm and dry, no rash Neuro:  Strength and sensation are intact Psych: euthymic mood, full affect   EKG:  Not completed during this office visit.   Recent Labs: 02/26/2017: Magnesium 2.1; TSH 3.10 04/27/2017: ALT 15 01/26/2018: BUN 8; Creatinine, Ser 0.76; Hemoglobin 11.6; Platelets 106; Potassium 4.1; Sodium 141    Lipid Panel    Component Value Date/Time   CHOL 141 02/26/2017 1046   TRIG 58 02/26/2017 1046   HDL 50 02/26/2017 1046   CHOLHDL 2.8 02/26/2017 1046   VLDL 19 09/15/2016 1729   LDLCALC 78 02/26/2017 1046      Wt Readings from Last 3 Encounters:  02/11/18 137 lb (62.1 kg)  02/10/18 137 lb (62.1 kg)  01/26/18 135 lb 11.2 oz (61.6 kg)      Other studies Reviewed: CARDIAC CATHETERIZATION 01/25/18  Right Heart Pressures Hemodynamic findings consistent with aortic valve stenosis. Right atrial pressure- 6/4 Right ventricular pressure-34/1 Pulmonary artery pressure- 37/14, mean 22 Pulmonary wedge pressure- A-wave 18, V wave 17, mean 13 LVEDP- 23 Cardiac output by Fick- 5.3 L/min, thermodilution-8 L/min Aortic valve gradient- 50 mmHg Aortic valve area (Fick) 0.55 cm    IMPRESSION:Robert Schaefer has normal coronary arteries and critical aortic stenosis. His iliac stents are widely patent. Given his age and elevated blood pressure I think it is prudent to keep him overnight for observation. He will be gently hydrated and discharged home in the morning. I will refer him to  TCT S for surgical evaluation for aortic valve replacement.    ASSESSMENT AND PLAN:  1.  AoV stenosis: Noted per echo with valve area of 0.42 cm2. He was been seen  by Dr. Cyndia Bent for evaluation of need for repair/replacement. He is having CTA completed, and chest CTA with known history of small AAA. Once all testing is completed surgery will be scheduled at the discretion of CVTS.   2. PAD: Hx of bilateral iliac kissing stents. Followed by Dr. [name].  No complaints of leg pain or weakness   3. Hypertension: BP today is normal. No changes in his regimen at this time.   4. Hx of COPD: He has continued NYHA Class II symptoms. Uses inhalers.  Current medicines are reviewed at length with the patient today.    Labs/ tests ordered today include: None  Robert Schaefer. Robert Schaefer, ANP, AACC   02/11/2018 10:25 AM    [facility] [address] [place], [place] [address] Phone: [phone]; Fax: [phone]

## 2018-02-11 ENCOUNTER — Ambulatory Visit (INDEPENDENT_AMBULATORY_CARE_PROVIDER_SITE_OTHER): Payer: Medicare Other | Admitting: Adult Health

## 2018-02-11 ENCOUNTER — Encounter: Payer: Self-pay | Admitting: Adult Health

## 2018-02-11 VITALS — BP 138/44 | HR 62 | Ht 65.0 in | Wt 137.0 lb

## 2018-02-11 DIAGNOSIS — I739 Peripheral vascular disease, unspecified: Secondary | ICD-10-CM | POA: Diagnosis not present

## 2018-02-11 DIAGNOSIS — I714 Abdominal aortic aneurysm, without rupture, unspecified: Secondary | ICD-10-CM

## 2018-02-11 DIAGNOSIS — I10 Essential (primary) hypertension: Secondary | ICD-10-CM | POA: Diagnosis not present

## 2018-02-11 DIAGNOSIS — I350 Nonrheumatic aortic (valve) stenosis: Secondary | ICD-10-CM | POA: Diagnosis not present

## 2018-02-11 NOTE — Patient Instructions (Signed)
Medication Instructions:  NO CHANGES- Your physician recommends that you continue on your current medications as directed. Please refer to the Current Medication list given to you today.  If you need a refill on your cardiac medications before your next appointment, please call your pharmacy.  Follow-Up: Your physician wants you to follow-up in: 3 MONTHS WITH DR [name].    Thank you for choosing [facility] at [facility]!!

## 2018-02-12 ENCOUNTER — Other Ambulatory Visit: Payer: Self-pay

## 2018-02-12 DIAGNOSIS — I350 Nonrheumatic aortic (valve) stenosis: Secondary | ICD-10-CM

## 2018-02-17 ENCOUNTER — Ambulatory Visit (HOSPITAL_COMMUNITY)
Admission: RE | Admit: 2018-02-17 | Discharge: 2018-02-17 | Disposition: A | Discharge status: Home / Self Care | Payer: Medicare Other | Source: Ambulatory Visit | Attending: Surgery | Admitting: Surgery

## 2018-02-17 ENCOUNTER — Encounter (HOSPITAL_COMMUNITY): Payer: Self-pay

## 2018-02-17 DIAGNOSIS — I714 Abdominal aortic aneurysm, without rupture: Secondary | ICD-10-CM | POA: Insufficient documentation

## 2018-02-17 DIAGNOSIS — I350 Nonrheumatic aortic (valve) stenosis: Secondary | ICD-10-CM

## 2018-02-17 DIAGNOSIS — Q396 Congenital diverticulum of esophagus: Secondary | ICD-10-CM | POA: Insufficient documentation

## 2018-02-17 DIAGNOSIS — I2510 Atherosclerotic heart disease of native coronary artery without angina pectoris: Secondary | ICD-10-CM | POA: Diagnosis not present

## 2018-02-17 DIAGNOSIS — I700 Atherosclerosis of aorta: Secondary | ICD-10-CM | POA: Insufficient documentation

## 2018-02-17 DIAGNOSIS — I517 Cardiomegaly: Secondary | ICD-10-CM | POA: Diagnosis not present

## 2018-02-17 MED ORDER — IOPAMIDOL (ISOVUE-370) INJECTION 76%
100.0000 mL | Freq: Once | INTRAVENOUS | Status: AC | PRN
  Administered 2018-02-17: 100 mL via INTRAVENOUS

## 2018-02-24 ENCOUNTER — Ambulatory Visit: Payer: Medicare Other | Admitting: Surgery

## 2018-02-25 ENCOUNTER — Other Ambulatory Visit: Payer: Self-pay

## 2018-02-25 ENCOUNTER — Encounter: Payer: Self-pay | Admitting: Surgery

## 2018-02-25 ENCOUNTER — Ambulatory Visit (INDEPENDENT_AMBULATORY_CARE_PROVIDER_SITE_OTHER): Payer: Medicare Other | Admitting: Surgery

## 2018-02-25 VITALS — BP 132/56 | HR 94 | Resp 18 | Ht 65.0 in | Wt 141.2 lb

## 2018-02-25 DIAGNOSIS — I350 Nonrheumatic aortic (valve) stenosis: Secondary | ICD-10-CM

## 2018-02-25 NOTE — Progress Notes (Addendum)
HPI:  Robert Schaefer returns today to review the results of his CT scans done for TAVR work-up.  He has been feeling about the same with some exertional shortness of breath and fatigue and is still [place] Heart Association class III.  His gated cardiac CTA shows anatomy that is suitable for transcatheter aortic valve replacement using a Sapien 3 valve.  His abdominal and pelvic CTA shows severe abdominal aortic and iliac vascular disease with significant narrowing within the bilateral iliac stents.  Transfemoral access is not an option.  He has significant calcification at the origin of his left common carotid and left subclavian artery and I think the best access in this gentleman is going to be transapical.  He has a mildly dilated left ventricle with a normal ejection fraction.  I reviewed the transcatheter aortic valve procedure by transapical insertion with the patient and his wife and daughter. We discussed complications that might develop including but not limited to risks of death, stroke, paravalvular leak, aortic dissection or other major vascular complications, aortic annulus rupture, device embolization, cardiac rupture or perforation, mitral regurgitation, acute myocardial infarction, arrhythmia, heart block or bradycardia requiring permanent pacemaker placement, congestive heart failure, respiratory failure, renal failure, pneumonia, infection, other late complications related to structural valve deterioration or migration, or other complications that might ultimately cause a temporary or permanent loss of functional independence or other long term morbidity. The patient provides full informed consent for the procedure as described and all questions were answered.      Current Outpatient Medications  Medication Sig Dispense Refill  . albuterol (PROVENTIL HFA;VENTOLIN HFA) 108 (90 Base) MCG/ACT inhaler Inhale 1 puff into the lungs every 6 (six) hours as needed for wheezing or shortness of  breath.    . alfuzosin (UROXATRAL) 10 MG 24 hr tablet Take 1 tablet (10 mg total) by mouth daily with breakfast. 30 tablet 0  . aspirin EC 81 MG tablet Take 81 mg by mouth daily.    . Cholecalciferol (VITAMIN D3) 1000 units CAPS Take 7,000 Units by mouth daily.     . clopidogrel (PLAVIX) 75 MG tablet Take 75 mg daily by mouth.    . feeding supplement, ENSURE ENLIVE, (ENSURE ENLIVE) LIQD Take 237 mLs by mouth 2 (two) times daily between meals. 237 mL 12  . fluticasone (FLONASE) 50 MCG/ACT nasal spray Place 2 sprays into both nostrils daily. 16 g 3  . HYDROcodone-acetaminophen (NORCO/VICODIN) 5-325 MG tablet Take 1 tablet by mouth every 4 (four) hours as needed for moderate pain.  0  . levothyroxine (SYNTHROID, LEVOTHROID) 50 MCG tablet TAKE 1 TABLET BY MOUTH ONCE DAILY 90 tablet 1   No current facility-administered medications for this visit.      Physical Exam: BP (!) 132/56 (BP Location: Right Arm, Patient Position: Sitting, Cuff Size: Normal)   Pulse 94   Resp 18   Ht 5\' 5"  (1.651 m)   Wt 141 lb 3.2 oz (64 kg)   SpO2 97% Comment: RA  BMI 23.50 kg/m  He is an elderly frail-appearing gentleman in no distress. Cardiac exam shows a regular rate and rhythm with a 3/6 systolic murmur along the right sternal border.  There is a 2/6 diastolic murmur along the left lower sternal border. Lungs are clear. There is no peripheral edema.  Diagnostic Tests:  ADDENDUM REPORT: 02/17/2018 16:40  EXAM: OVER-READ INTERPRETATION  CT CHEST  The following report is an over-read performed by radiologist Dr. Rebekah Chesterfield [facility]  on 02/17/2018. This over-read does not include interpretation of cardiac or coronary anatomy or pathology. The coronary CTA interpretation by the cardiologist is attached.  COMPARISON:  None.  FINDINGS: Extracardiac findings will be described separately under dictation for contemporaneously obtained CTA chest, abdomen and  pelvis.  IMPRESSION: Please see separate dictation for contemporaneously obtained CTA chest, abdomen and pelvis 02/17/2018 for full description of relevant extracardiac findings.   Electronically Signed   By: Vinnie Langton M.D.   On: 02/17/2018 16:40   Addended by Etheleen Mayhew, MD on 02/17/2018 4:42 PM    Study Result   CLINICAL DATA:  Aortic Stenosis  EXAM: Cardiac TAVR CT  TECHNIQUE: The patient was scanned on a Siemens Force 259 slice scanner. A 120 kV retrospective scan was triggered in the ascending thoracic aorta at 140 HU's. Gantry rotation speed was 250 msecs and collimation was .6 mm. No beta blockade or nitro were given. The 3D data set was reconstructed in 5% intervals of the R-R cycle. Systolic and diastolic phases were analyzed on a dedicated work station using MPR, MIP and VRT modes. The patient received 80 cc of contrast.  FINDINGS: Aortic Valve: Calcified and tri leaflet with restricted motion  Aorta: Mild calcific aortic debris No aneurysm normal arch vessels  Sino-tubular Junction: 27 mm  Ascending Thoracic Aorta: 31 mm  Aortic Arch: 29 mm  Descending Thoracic Aorta: 23 mm  Sinus of Valsalva Measurements:  Non-coronary: 29.5 mm  Right - coronary: 32 mm  Left -   coronary: 32 mm  Coronary Artery Height above Annulus:  Left Main: 10.8 mm above annulus  Right Coronary: 12.5 mm above annulus  Virtual Basal Annulus Measurements:  Maximum / Minimum Diameter: 25.4 mm x 22 mm  Perimeter: 78 mm  Area: 472 mm2  Coronary Arteries: LM a bit shallow but should be ok for deployment  Optimum Fluoroscopic Angle for Delivery: LAO 7 degrees Cranial 4 degrees  IMPRESSION: 1. Calcified trileaflet AV with annular area of 472 mm2 suitable for a 26 mm Sapien 3 valve  2.  No LAA thrombus  3. Optimum angiographic angle for deployment LAO 7 Cranial 4 degrees  4. LM a bit shallow at 10.8 mm above annulus RCA  12.5 mm above annulus  5.  Normal aortic root 3.1 cm  [name]  Electronically Signed: By: [name] On: 02/17/2018 13:54      CLINICAL DATA:  77 year old male with history of severe aortic stenosis. Preprocedural study prior to potential transcatheter aortic valve replacement (TAVR) procedure.  EXAM: CT ANGIOGRAPHY CHEST, ABDOMEN AND PELVIS  TECHNIQUE: Multidetector CT imaging through the chest, abdomen and pelvis was performed using the standard protocol during bolus administration of intravenous contrast. Multiplanar reconstructed images and MIPs were obtained and reviewed to evaluate the vascular anatomy.  CONTRAST:  179mL ISOVUE-370 IOPAMIDOL (ISOVUE-370) INJECTION 76%  COMPARISON:  Chest CT 01/05/2017. CT the abdomen and pelvis 10/09/2016. PET-CT 08/19/2016.  FINDINGS: CTA CHEST FINDINGS  Cardiovascular: Heart size is mildly enlarged. There is no significant pericardial fluid, thickening or pericardial calcification. There is aortic atherosclerosis, as well as atherosclerosis of the great vessels of the mediastinum and the coronary arteries, including calcified atherosclerotic plaque in the left main, left anterior descending, left circumflex and right coronary arteries. Severe calcifications of the aortic valve.  Mediastinum/Nodes: No pathologically enlarged mediastinal or hilar lymph nodes. Multiple densely calcified mediastinal and bilateral hilar lymph nodes are incidentally noted. Severe circumferential mural thickening throughout the esophagus. In the lateral  aspect of the lower third of the esophagus there is a 4.5 x 3.7 cm diverticulum on the right side. No axillary lymphadenopathy.  Lungs/Pleura: Previously noted nodular area of architectural distortion in the left upper lobe is similar to the prior study measuring 1.8 x 0.6 cm (axial image 32 of series 7), most compatible with resolving post infectious or inflammatory  scarring. Patchy peribronchovascular micro nodularity noted in the periphery of the lungs, similar to prior studies, most compatible with areas of chronic mucoid impaction. Calcified granuloma near the apex of the right upper lobe. Diffuse bronchial wall thickening. No acute consolidative airspace disease. No pleural effusions.  Musculoskeletal: No aggressive appearing lytic or blastic lesions are noted in the visualized portions of the skeleton.  CTA ABDOMEN AND PELVIS FINDINGS  Hepatobiliary: No suspicious cystic or solid hepatic lesions. No intra or extrahepatic biliary ductal dilatation. Gallbladder is normal in appearance.  Pancreas: No pancreatic mass. No pancreatic ductal dilatation. No pancreatic or peripancreatic fluid or inflammatory changes.  Spleen: Unremarkable.  Adrenals/Urinary Tract: Subcentimeter low-attenuation lesion in the upper pole the right kidney, too small to characterize, but statistically likely a tiny cyst. 1.5 cm exophytic simple cyst in the lower pole of the right kidney. Left kidney and bilateral adrenal glands are normal in appearance. No hydroureteronephrosis. Urinary bladder is normal in appearance.  Stomach/Bowel: Normal appearance of the stomach. No pathologic dilatation of small bowel or colon. Normal appendix.  Vascular/Lymphatic: Severe aortic atherosclerosis, including mild fusiform aneurysmal dilatation of the infrarenal abdominal aorta which measures up to 2.8 x 3.2 cm. Several serrated plaques are noted in the infrarenal abdominal aorta as well. Vascular findings and measurements pertinent to potential TAVR procedure, as detailed below. Please take note of the presence of small bore stents in the common iliac arteries bilaterally. No lymphadenopathy noted in the abdomen or pelvis.  Reproductive: Fiducial markers in the prostate gland. Seminal vesicles are unremarkable.  Other: No significant volume of ascites.  No  pneumoperitoneum.  Musculoskeletal: There are no aggressive appearing lytic or blastic lesions noted in the visualized portions of the skeleton.  VASCULAR MEASUREMENTS PERTINENT TO TAVR:  AORTA:  Minimal Aortic Diameter-16 x 16 mm  Severity of Aortic Calcification-severe  RIGHT PELVIS:  Right Common Iliac Artery -  Comment - Stent in place.  Minimal Diameter-3.5 x 3.7 mm  Tortuosity-mild  Calcification-severe with stent  Right External Iliac Artery -  Minimal Diameter-6.5 x 5.6 mm  Tortuosity-mild-to-moderate  Calcification-mild  Right Common Femoral Artery -  Minimal Diameter-6.0 x 3.7 mm  Tortuosity-mild  Calcification-moderate  LEFT PELVIS:  Comment - Stent in place.  Left Common Iliac Artery -  Minimal Diameter-4.4 x 3.7 mm  Tortuosity-mild  Calcification-severe with stent  Left External Iliac Artery -  Minimal Diameter-7.4 x 4.0 mm  Tortuosity-mild-to-moderate  Calcification-mild  Left Common Femoral Artery -  Minimal Diameter-5.5 x 4.7 mm  Tortuosity-mild  Calcification-moderate  Review of the MIP images confirms the above findings.  IMPRESSION: 1. Vascular findings and measurements pertinent to potential TAVR procedure, as detailed above. 2. Severe thickening calcification of the aortic valve, compatible with the reported clinical history of severe aortic stenosis. 3. Aortic atherosclerosis, in addition to left main and 3 vessel coronary artery disease. There is also aneurysmal dilatation of the infrarenal abdominal aorta which measures up to 3 cm in diameter. There also several ulcerated plaques, which may have implications for passage of catheter during procedure. 4. Mild cardiomegaly. 5. Severe circumferential thickening of much of the thoracic esophagus, favored  to be related to chronic esophagitis, however, the possibility of neoplasm is not entirely excluded. In addition, there is a  large distal esophageal diverticulum. Further evaluation with endoscopy and consideration for biopsy is suggested in the near future to exclude malignancy. 6. Aortic atherosclerosis, in addition to left main and 3 vessel coronary artery disease. Assessment for potential risk factor modification, dietary therapy or pharmacologic therapy may be warranted, if clinically indicated. 7. Similar appearing area of nodular architectural distortion in the left upper lobe, most compatible with a resolving area of post infectious or inflammatory scarring. 8. Additional incidental findings, as above.   Electronically Signed   By: Vinnie Langton M.D.   On: 02/17/2018 16:09   Impression:  I think Robert Schaefer is a candidate for transcatheter aortic valve replacement but will require trans-apical insertion due to severe aortoiliac vascular disease.  His chest CTA shows a thick walled and dilated esophagus consistent with his diagnosis of achalasia.  It also shows a 4.5 x 3.7 cm thin-walled diverticulum in the right lateral aspect of the lower third of the esophagus.  I do not think transesophageal echocardiogram would be wise in this patient due to the risk of perforation.  I think we would have to do a preoperative transthoracic echocardiogram and then repeated at the end of the procedure once the chest incision has been closed.  We could do an aortogram after the valve was implanted to assess for any signs of significant perivalvular leak.  Plan:  This will be scheduled tentatively for 03/16/2018.  In the meantime he will complete his work-up with pulmonary function testing, a PT evaluation, and evaluation by one of the structural heart cardiologists.  I spent 15 minutes performing this established patient evaluation and > 50% of this time was spent face to face counseling and coordinating the care of this patient's severe symptomatic aortic stenosis.    Gaye Pollack, MD [facility] [phone]

## 2018-02-28 DIAGNOSIS — Z952 Presence of prosthetic heart valve: Secondary | ICD-10-CM

## 2018-02-28 HISTORY — DX: Presence of prosthetic heart valve: Z95.2

## 2018-03-02 NOTE — Progress Notes (Addendum)
MEDICARE ANNUAL WELLNESS VISIT AND FOLLOW UP  Assessment:    Essential hypertension - continue medications, DASH diet, exercise and monitor at home. Call if greater than 130/80.  -     CBC with Differential/Platelet -     BASIC METABOLIC PANEL WITH GFR -     Hepatic function panel -     TSH -     Urinalysis, Routine w reflex microscopic -     Microalbumin / creatinine urine ratio  PVD (peripheral vascular disease) (HCC) Control blood pressure, cholesterol, glucose, increase exercise.   Atherosclerosis of aorta (HCC) Control blood pressure, cholesterol, glucose, increase exercise.   Mesenteric ischemia (HCC) Control blood pressure, cholesterol, glucose, increase exercise.   COPD No triggers, well controlled symptoms, cont to monitor  Hypothyroidism, unspecified type Hypothyroidism-check TSH level, continue medications the same, reminded to take on an empty stomach 30-56mins before food.  -     TSH  Pure hypercholesterolemia -continue medications, check lipids, decrease fatty foods, increase activity.  -     Lipid panel  Medication management -     Magnesium  Claudication (HCC) Control blood pressure, cholesterol, glucose, increase exercise.   Advanced care planning/counseling discussion Discussed with patient, will bring in papers With up coming surgery had a long discussion about this and wishes  Aortic stenosis, moderate + symptoms, getting TAVR on the 17th, discussed further with patient.   OSA and COPD overlap syndrome (HCC) Not on CPAP, states can not wear, STRONGLY SUGGEST getting on it with heart/lung history, given information and will consider  COPD with asthma ([place]) Not on CPAP, states can not wear, STRONGLY SUGGEST getting on it with heart/lung history, given information and will consider Follow up pulmonary  Cavitating mass in left upper lung lobe Follow up pulmonary  Hiatal hernia Monitor  Gastroesophageal reflux disease, esophagitis presence  not specified Continue PPI/H2 blocker, diet discussed  Irritable bowel syndrome, unspecified type If not on benefiber then add it, decrease stress,  if any worsening symptoms, blood in stool, AB pain, etc call office  BPH w/LUTS monitor  Prediabetes Discussed general issues about diabetes pathophysiology and management., Educational material distributed., Suggested low cholesterol diet., Encouraged aerobic exercise., Discussed foot care., Reminded to get yearly retinal exam.  RLS (restless legs syndrome)  Vitamin D deficiency Continue supplement  Prostate cancer ([place]) Continue follow up Following with Dr. [name]  Senile purpura  Discussed process, protect skin, sunscreen   Over 30 minutes of exam, counseling, chart review and critical decision making was performed NO LABS TODAY PER PATIENT REQUEST, HAS LABS PENDING   Future Appointments  Date Time Provider [place]  03/11/2018 10:00 AM Thom Chimes [facility]  03/11/2018 11:20 AM Sherren Mocha, MD [facility]  03/11/2018  1:00 PM [facility] PAT 3 [facility]SD[facility] None  03/11/2018  2:00 PM MC-RESPTX TECH [facility]-RESPTX None  03/11/2018  3:00 PM [place] [facility] US 1-JUL[facility]ET [place] [facility]  05/18/2018  3:45 PM [name], MD [facility]     Plan:   During the course of the visit the patient was educated and counseled about appropriate screening and preventive services including:    Pneumococcal vaccine  Prevnar 13   Influenza vaccine  Td vaccine  Screening electrocardiogram  Bone densitometry screening  Colorectal cancer screening  Diabetes screening  Glaucoma screening  Nutrition counseling   Advanced directives: requested   Subjective:  Robert Schaefer is a 77 y.o. male who presents for Medicare Annual Wellness Visit and follow up  He  has pain and decreased hearing in his right ear. Has not driven in a year.   He has had elevated blood pressure since . His blood pressure has  been controlled at home, today their BP is BP: 130/60 He does not workout. He denies chest pain, shortness of breath, dizziness.   History of PAD s/p  intervention with Dr. [name] to right common iliac and mesenteric. Marland KitchenHe has severe AS on recent cath in July and is scheduled for surgery/TAVR on the 17th, normal stress test 03/2016. He has COPD, is ex smoker, quit 10 years ago, and is on plavix and ASA.  BMI is Body mass index is 23.3 kg/m., he is working on diet and exercise. Wt Readings from Last 3 Encounters:  03/04/18 140 lb (63.5 kg)  02/25/18 141 lb 3.2 oz (64 kg)  02/11/18 137 lb (62.1 kg)   He is following with Dr. [name] for prostate cancer, seeing every 3-6 months.   He is not on cholesterol medication and denies myalgias. His cholesterol is not at goal of 70 or less. The cholesterol last visit was:   Lab Results  Component Value Date   CHOL 141 02/26/2017   HDL 50 02/26/2017   LDLCALC 78 02/26/2017   TRIG 58 02/26/2017   CHOLHDL 2.8 02/26/2017   He has had prediabetes for years. He has been working on diet and exercise for prediabetes, and denies foot ulcerations, hyperglycemia, hypoglycemia , increased appetite, paresthesia of the feet, polydipsia, polyuria, visual disturbances and weight loss. Last A1C in the office was:  Lab Results  Component Value Date   HGBA1C 4.9 09/15/2016   Last GFR: Lab Results  Component Value Date   GFRNONAA >60 01/26/2018   Patient is on Vitamin D supplement, 7000 a day.   Lab Results  Component Value Date   VD25OH 75 09/15/2016   He is on thyroid medication. His medication was not changed last visit.   Lab Results  Component Value Date   TSH 3.10 02/26/2017  .   Medication Review: Current Outpatient Medications on File Prior to Visit  Medication Sig Dispense Refill  . albuterol (PROVENTIL HFA;VENTOLIN HFA) 108 (90 Base) MCG/ACT inhaler Inhale 1 puff into the lungs every 6 (six) hours as needed for wheezing or shortness of breath.     . alfuzosin (UROXATRAL) 10 MG 24 hr tablet Take 1 tablet (10 mg total) by mouth daily with breakfast. 30 tablet 0  . aspirin EC 81 MG tablet Take 81 mg by mouth daily.    . Cholecalciferol (VITAMIN D3) 1000 units CAPS Take 7,000 Units by mouth daily.     . clopidogrel (PLAVIX) 75 MG tablet Take 75 mg daily by mouth.    . feeding supplement, ENSURE ENLIVE, (ENSURE ENLIVE) LIQD Take 237 mLs by mouth 2 (two) times daily between meals. 237 mL 12  . fluticasone (FLONASE) 50 MCG/ACT nasal spray Place 2 sprays into both nostrils daily. 16 g 3  . HYDROcodone-acetaminophen (NORCO/VICODIN) 5-325 MG tablet Take 1 tablet by mouth every 4 (four) hours as needed for moderate pain.  0  . levothyroxine (SYNTHROID, LEVOTHROID) 50 MCG tablet TAKE 1 TABLET BY MOUTH ONCE DAILY 90 tablet 1   No current facility-administered medications on file prior to visit.     Current Problems (verified) Patient Active Problem List   Diagnosis Date Noted  . Esophageal stricture   . Malignant neoplasm of prostate ([place]) 03/03/2017  . Mesenteric ischemia ([place]) 09/16/2016  . Atherosclerosis of aorta ([place]) 09/15/2016  .  Hiatal hernia 09/03/2016  . Cavitating mass in left upper lung lobe 08/12/2016  . PVD (peripheral vascular disease) ([place]) 05/31/2016  . Aortic stenosis, moderate 05/27/2016  . Claudication ([place]) 04/16/2016  . Hypothyroidism 11/07/2014  . BPH w/LUTS 11/07/2014  . Medication management 04/11/2014  . Vitamin D deficiency 07/05/2013  . Hyperlipidemia   . Prediabetes   . COPD with asthma ([place])   . IBS (irritable bowel syndrome)   . RLS (restless legs syndrome)   . OSA and COPD overlap syndrome ([place])   . Essential hypertension 09/28/2009  . GERD 09/28/2009    Screening Tests Immunization History  Administered Date(s) Administered  . DT 09/22/2013  . Influenza, High Dose Seasonal PF 04/11/2014, 04/20/2015  . Zoster 06/30/2005    Preventative care: Last colonoscopy: 2011 PFT 07/2016 EGD 2019 CT  chest 01/2018 MGM 2015 US carotid 09/2014 Echo 12/2017 Stress test 03/2016 Cath 12/2017  Prior vaccinations: TD or Tdap: 2015  Influenza: 2016  Pneumococcal:  Prevnar13:  Shingles/Zostavax: 2007  Names of Other Physician/Practitioners you currently use: 1. [facility] here for primary care 2. Does not see one in particular, eye doctor, last visit 2015 3. Dr.Jones , dentist, last visit 2017  Patient Care Team: [name], MD as PCP - General (Internal Medicine) [name], MD as PCP - Cardiology (Cardiology) Jari Pigg, MD as Consulting Physician (Dermatology) Larey Dresser, MD as Consulting Physician (Cardiology) Carolan Clines, MD as Consulting Physician (Urology) Erroll Luna, MD as Consulting Physician (General Surgery) Josue Hector, MD as Consulting Physician (Cardiology) Michael Boston, MD as Consulting Physician (General Surgery) Armbruster, Carlota Raspberry, MD as Consulting Physician (Gastroenterology)  Allergies Allergies  Allergen Reactions  . Penicillins Anaphylaxis, Itching, Swelling and Other (See Comments)    Has patient had a PCN reaction causing immediate rash, facial/tongue/throat swelling, SOB or lightheadedness with hypotension: Yes Has patient had a PCN reaction causing severe rash involving mucus membranes or skin necrosis: No Has patient had a PCN reaction that required hospitalization: Yes Has patient had a PCN reaction occurring within the last 10 years: No If all of the above answers are "NO", then may proceed with Cephalosporin use.   . Sulfa Antibiotics Anaphylaxis and Swelling  . Versed [Midazolam] Other (See Comments)    Confusion, delerium    SURGICAL HISTORY He  has a past surgical history that includes Colonoscopy; Pilonidal cyst / sinus excision (2000); Insertion of mesh (Right, 02/23/2014); Inguinal hernia repair (Right, 02/23/2014); Hernia repair; Cataract extraction w/ intraocular  lens  implant, bilateral (Bilateral); Esophagogastroduodenoscopy (egd) with esophageal dilation (last one 11-09-2015  dr Yolanda Bonine); Cardiac catheterization (N/A, 05/29/2016); Prostate biopsy (09-22-2016  dr [name] office); Cardiovascular stress test (04-23-2016   dr berry); transthoracic echocardiogram (05-05-2016   dr berry); Gold seed implant (N/A, 04/29/2017); SPACE OAR INSTILLATION (N/A, 04/29/2017); Upper gastrointestinal endoscopy; Esophageal manometry (N/A, 10/12/2017); Esophagogastroduodenoscopy (N/A, 10/12/2017); Savory dilation (N/A, 10/12/2017); Cardiac catheterization (Bilateral, 01/25/2018); Anal fistulectomy (2010); RIGHT/LEFT HEART CATH AND CORONARY ANGIOGRAPHY (N/A, 01/25/2018); and ABDOMINAL AORTOGRAM (N/A, 01/25/2018). FAMILY HISTORY His family history includes Asthma in his father; Heart attack in his mother; Stroke in his father. SOCIAL HISTORY He  reports that he quit smoking about 12 years ago. His smoking use included cigarettes. He has a 50.00 pack-year smoking history. He has never used smokeless tobacco. He reports that he does not drink alcohol or use drugs.  MEDICARE WELLNESS OBJECTIVES: Physical activity: Current Exercise Habits: The patient does not participate in regular exercise at present, Exercise  limited by: respiratory conditions(s);cardiac condition(s) Cardiac risk factors: Cardiac Risk Factors include: advanced age (>105men, >69 women);dyslipidemia;hypertension;male gender;sedentary lifestyle;family history of premature cardiovascular disease Depression/mood screen:   Depression screen [facility] 2/9 03/04/2018  Decreased Interest 0  Down, Depressed, Hopeless 0  PHQ - 2 Score 0    ADLs:  In your present state of health, do you have any difficulty performing the following activities: 03/04/2018 01/25/2018  Hearing? [place]? N -  Comment - -  Difficulty concentrating or making decisions? Y -  Walking or climbing stairs? Y -  Comment - -  Dressing or bathing? N  -  Doing errands, shopping? Y Y  Comment - "I'm not driving right now"  Some recent data might be hidden     Cognitive Testing  Alert? Yes  Normal Appearance?Yes  Oriented to person? Yes  Place? Yes   Time? Yes  Recall of three objects?  Yes  Can perform simple calculations? Yes  Displays appropriate judgment?Yes  Can read the correct time from a watch face?Yes  EOL planning: Does Patient Have a Medical Advance Directive?: No Would patient like information on creating a medical advance directive?: Yes (MAU/Ambulatory/Procedural Areas - Information given)  Review of Systems  Constitutional: Negative for chills, fever and malaise/fatigue.  HENT: Negative for congestion and sore throat.   Respiratory: Negative for cough, shortness of breath and wheezing.   Cardiovascular: Negative for chest pain, palpitations and leg swelling.  Gastrointestinal: Negative for abdominal pain, blood in stool, constipation, diarrhea, heartburn, melena, nausea and vomiting.  Genitourinary: Negative.   Musculoskeletal: Positive for joint pain and myalgias.  Skin: Negative.   Neurological: Negative for dizziness, sensory change, loss of consciousness and headaches.  Psychiatric/Behavioral: Negative for depression. The patient is not nervous/anxious and does not have insomnia.      Objective:     Today's Vitals   03/04/18 0854  BP: 130/60  Pulse: 70  Resp: 14  Temp: 98.3 F (36.8 C)  SpO2: 95%  Weight: 140 lb (63.5 kg)  Height: 5\' 5"  (1.651 m)   Body mass index is 23.3 kg/m.  General appearance: alert, no distress, WD/WN, male HEENT: normocephalic, sclerae anicteric, TMs pearly, nares patent, no discharge or erythema, pharynx normal Oral cavity: MMM, no lesions Neck: supple, no lymphadenopathy, no thyromegaly, no masses Heart: RRR, normal S1, S2, crescendo decresendo murmur to the 2nd right ICS with radiation to the LSB Lungs: CTA bilaterally, no wheezes, rhonchi, or rales Abdomen: +bs,  soft, non tender, non distended, no masses, no hepatomegaly, no splenomegaly Musculoskeletal: nontender, no swelling, no obvious deformity Extremities: no edema, no cyanosis, no clubbing Pulses: 2+ symmetric, upper and lower extremities, normal cap refill Neurological: alert, oriented x 3, CN2-12 intact, strength normal upper extremities and lower extremities, sensation normal throughout, DTRs 2+ throughout, no cerebellar signs, gait Normal Psychiatric: normal affect, behavior normal, pleasant   Medicare Attestation I have personally reviewed: The patient's medical and social history Their use of alcohol, tobacco or illicit drugs Their current medications and supplements The patient's functional ability including ADLs,fall risks, home safety risks, cognitive, and hearing and visual impairment Diet and physical activities Evidence for depression or mood disorders  The patient's weight, height, BMI, and visual acuity have been recorded in the chart.  I have made referrals, counseling, and provided education to the patient based on review of the above and I have provided the patient with a written personalized care plan for preventive services.  Vicie Mutters, PA-C   03/04/2018

## 2018-03-03 ENCOUNTER — Other Ambulatory Visit: Payer: Self-pay

## 2018-03-03 DIAGNOSIS — I350 Nonrheumatic aortic (valve) stenosis: Secondary | ICD-10-CM

## 2018-03-04 ENCOUNTER — Encounter: Payer: Self-pay | Admitting: Physician Assistant

## 2018-03-04 ENCOUNTER — Ambulatory Visit (INDEPENDENT_AMBULATORY_CARE_PROVIDER_SITE_OTHER): Payer: Medicare Other | Admitting: Physician Assistant

## 2018-03-04 VITALS — BP 130/60 | HR 70 | Temp 98.3°F | Resp 14 | Ht 65.0 in | Wt 140.0 lb

## 2018-03-04 DIAGNOSIS — J449 Chronic obstructive pulmonary disease, unspecified: Secondary | ICD-10-CM | POA: Diagnosis not present

## 2018-03-04 DIAGNOSIS — J984 Other disorders of lung: Secondary | ICD-10-CM

## 2018-03-04 DIAGNOSIS — K559 Vascular disorder of intestine, unspecified: Secondary | ICD-10-CM

## 2018-03-04 DIAGNOSIS — N401 Enlarged prostate with lower urinary tract symptoms: Secondary | ICD-10-CM

## 2018-03-04 DIAGNOSIS — K589 Irritable bowel syndrome without diarrhea: Secondary | ICD-10-CM

## 2018-03-04 DIAGNOSIS — D692 Other nonthrombocytopenic purpura: Secondary | ICD-10-CM

## 2018-03-04 DIAGNOSIS — K222 Esophageal obstruction: Secondary | ICD-10-CM

## 2018-03-04 DIAGNOSIS — I700 Atherosclerosis of aorta: Secondary | ICD-10-CM | POA: Diagnosis not present

## 2018-03-04 DIAGNOSIS — Z79899 Other long term (current) drug therapy: Secondary | ICD-10-CM | POA: Diagnosis not present

## 2018-03-04 DIAGNOSIS — I10 Essential (primary) hypertension: Secondary | ICD-10-CM

## 2018-03-04 DIAGNOSIS — Z0001 Encounter for general adult medical examination with abnormal findings: Secondary | ICD-10-CM

## 2018-03-04 DIAGNOSIS — E7800 Pure hypercholesterolemia, unspecified: Secondary | ICD-10-CM | POA: Diagnosis not present

## 2018-03-04 DIAGNOSIS — E039 Hypothyroidism, unspecified: Secondary | ICD-10-CM

## 2018-03-04 DIAGNOSIS — I350 Nonrheumatic aortic (valve) stenosis: Secondary | ICD-10-CM

## 2018-03-04 DIAGNOSIS — R7303 Prediabetes: Secondary | ICD-10-CM

## 2018-03-04 DIAGNOSIS — E559 Vitamin D deficiency, unspecified: Secondary | ICD-10-CM

## 2018-03-04 DIAGNOSIS — G4733 Obstructive sleep apnea (adult) (pediatric): Secondary | ICD-10-CM

## 2018-03-04 DIAGNOSIS — R6889 Other general symptoms and signs: Secondary | ICD-10-CM

## 2018-03-04 DIAGNOSIS — C61 Malignant neoplasm of prostate: Secondary | ICD-10-CM

## 2018-03-04 DIAGNOSIS — Z7189 Other specified counseling: Secondary | ICD-10-CM

## 2018-03-04 DIAGNOSIS — N138 Other obstructive and reflux uropathy: Secondary | ICD-10-CM

## 2018-03-04 DIAGNOSIS — K449 Diaphragmatic hernia without obstruction or gangrene: Secondary | ICD-10-CM | POA: Diagnosis not present

## 2018-03-04 DIAGNOSIS — I739 Peripheral vascular disease, unspecified: Secondary | ICD-10-CM

## 2018-03-04 DIAGNOSIS — K219 Gastro-esophageal reflux disease without esophagitis: Secondary | ICD-10-CM

## 2018-03-04 DIAGNOSIS — Z0000 Encounter for general adult medical examination without abnormal findings: Secondary | ICD-10-CM

## 2018-03-04 DIAGNOSIS — G2581 Restless legs syndrome: Secondary | ICD-10-CM

## 2018-03-04 NOTE — Patient Instructions (Addendum)
  Robert Schaefer , Thank you for taking time to come for your Medicare Wellness Visit. I appreciate your ongoing commitment to your health goals. Please review the following plan we discussed and let me know if I can assist you in the future.   Ask if you   These are the goals we discussed: Goals    . Blood Pressure < 130/80    . LDL CALC < 70        This is a list of the screening recommended for you and due dates:  Health Maintenance  Topic Date Due  . Pneumonia vaccines (1 of 2 - PCV13) 02/22/2005  . Flu Shot  01/28/2018  . Tetanus Vaccine  09/23/2023    If you have been in the ER, hospital, or long term care facility, we want to see you within one week of discharge so please CONTACT OUR OFFICE at [phone].  We will try to contact you if we know about your admission/visit but we would love for you to contact us first.  We know your history and want to make sure all of your questions are answered and needs are taken care of after admission.   We also want our patients to know that we do NOT approve of [facility] soliciting our patients to do home visits and we do NOT approve of [facility] screenings. Please contact us before doing either of these "services".   Take 5000 a day of the vitamin D  Use a dropper or use a cap to put peroxide, olive oil,mineral oil or canola oil in the effected ear- 2-3 times a week. Let it soak for 20-30 min then you can take a shower or use a baby bulb with warm water to wash out the ear wax.  Do not use Qtips

## 2018-03-11 ENCOUNTER — Encounter: Payer: Self-pay | Admitting: Physical Therapy

## 2018-03-11 ENCOUNTER — Ambulatory Visit (HOSPITAL_COMMUNITY)
Admission: RE | Admit: 2018-03-11 | Discharge: 2018-03-11 | Disposition: A | Discharge status: Home / Self Care | Payer: Medicare Other | Source: Ambulatory Visit | Attending: Surgery | Admitting: Surgery

## 2018-03-11 ENCOUNTER — Encounter (HOSPITAL_COMMUNITY): Payer: Self-pay

## 2018-03-11 ENCOUNTER — Ambulatory Visit (HOSPITAL_COMMUNITY)
Admission: RE | Admit: 2018-03-11 | Discharge: 2018-03-11 | Disposition: A | Discharge status: Home / Self Care | Payer: Medicare Other | Source: Ambulatory Visit | Attending: Cardiovascular Disease | Admitting: Cardiovascular Disease

## 2018-03-11 ENCOUNTER — Other Ambulatory Visit (HOSPITAL_COMMUNITY): Payer: Medicare Other

## 2018-03-11 ENCOUNTER — Telehealth: Payer: Self-pay

## 2018-03-11 ENCOUNTER — Ambulatory Visit (INDEPENDENT_AMBULATORY_CARE_PROVIDER_SITE_OTHER): Payer: Medicare Other | Admitting: Cardiovascular Disease

## 2018-03-11 ENCOUNTER — Encounter (HOSPITAL_COMMUNITY)
Admission: RE | Admit: 2018-03-11 | Discharge: 2018-03-11 | Disposition: A | Discharge status: Home / Self Care | Payer: Medicare Other | Source: Ambulatory Visit | Attending: Cardiovascular Disease | Admitting: Cardiovascular Disease

## 2018-03-11 ENCOUNTER — Encounter: Payer: Self-pay | Admitting: Cardiovascular Disease

## 2018-03-11 ENCOUNTER — Ambulatory Visit: Payer: Medicare Other | Attending: Surgery | Admitting: Physical Therapy

## 2018-03-11 ENCOUNTER — Other Ambulatory Visit: Payer: Self-pay

## 2018-03-11 ENCOUNTER — Ambulatory Visit (HOSPITAL_BASED_OUTPATIENT_CLINIC_OR_DEPARTMENT_OTHER)
Admission: RE | Admit: 2018-03-11 | Discharge: 2018-03-11 | Disposition: A | Discharge status: Home / Self Care | Payer: Medicare Other | Source: Ambulatory Visit | Attending: Surgery | Admitting: Surgery

## 2018-03-11 VITALS — BP 112/50 | HR 85 | Ht 65.0 in | Wt 139.8 lb

## 2018-03-11 DIAGNOSIS — E039 Hypothyroidism, unspecified: Secondary | ICD-10-CM | POA: Insufficient documentation

## 2018-03-11 DIAGNOSIS — I350 Nonrheumatic aortic (valve) stenosis: Secondary | ICD-10-CM | POA: Diagnosis not present

## 2018-03-11 DIAGNOSIS — J449 Chronic obstructive pulmonary disease, unspecified: Secondary | ICD-10-CM | POA: Diagnosis not present

## 2018-03-11 DIAGNOSIS — R2689 Other abnormalities of gait and mobility: Secondary | ICD-10-CM | POA: Insufficient documentation

## 2018-03-11 DIAGNOSIS — G4733 Obstructive sleep apnea (adult) (pediatric): Secondary | ICD-10-CM | POA: Insufficient documentation

## 2018-03-11 DIAGNOSIS — Z79890 Hormone replacement therapy (postmenopausal): Secondary | ICD-10-CM | POA: Diagnosis not present

## 2018-03-11 DIAGNOSIS — R918 Other nonspecific abnormal finding of lung field: Secondary | ICD-10-CM | POA: Diagnosis not present

## 2018-03-11 DIAGNOSIS — I440 Atrioventricular block, first degree: Secondary | ICD-10-CM | POA: Insufficient documentation

## 2018-03-11 DIAGNOSIS — I10 Essential (primary) hypertension: Secondary | ICD-10-CM | POA: Insufficient documentation

## 2018-03-11 DIAGNOSIS — R293 Abnormal posture: Secondary | ICD-10-CM | POA: Insufficient documentation

## 2018-03-11 DIAGNOSIS — I739 Peripheral vascular disease, unspecified: Secondary | ICD-10-CM | POA: Insufficient documentation

## 2018-03-11 DIAGNOSIS — Z7902 Long term (current) use of antithrombotics/antiplatelets: Secondary | ICD-10-CM | POA: Diagnosis not present

## 2018-03-11 DIAGNOSIS — Z87891 Personal history of nicotine dependence: Secondary | ICD-10-CM | POA: Insufficient documentation

## 2018-03-11 DIAGNOSIS — K589 Irritable bowel syndrome without diarrhea: Secondary | ICD-10-CM | POA: Insufficient documentation

## 2018-03-11 DIAGNOSIS — Z7982 Long term (current) use of aspirin: Secondary | ICD-10-CM | POA: Diagnosis not present

## 2018-03-11 DIAGNOSIS — Z01818 Encounter for other preprocedural examination: Secondary | ICD-10-CM | POA: Insufficient documentation

## 2018-03-11 DIAGNOSIS — Z79899 Other long term (current) drug therapy: Secondary | ICD-10-CM | POA: Diagnosis not present

## 2018-03-11 DIAGNOSIS — E785 Hyperlipidemia, unspecified: Secondary | ICD-10-CM | POA: Diagnosis not present

## 2018-03-11 DIAGNOSIS — K219 Gastro-esophageal reflux disease without esophagitis: Secondary | ICD-10-CM | POA: Diagnosis not present

## 2018-03-11 LAB — TYPE AND SCREEN
ABO/RH(D): B POS
Antibody Screen: NEGATIVE

## 2018-03-11 LAB — PULMONARY FUNCTION TEST
DL/VA % PRED: 70 %
DL/VA: 3.01 ml/min/mmHg/L
DLCO COR % PRED: 46 %
DLCO cor: 11.99 ml/min/mmHg
DLCO unc % pred: 42 %
DLCO unc: 10.84 ml/min/mmHg
FEF 25-75 POST: 0.51 L/s
FEF 25-75 Pre: 0.52 L/sec
FEF2575-%CHANGE-POST: -1 %
FEF2575-%Pred-Post: 31 %
FEF2575-%Pred-Pre: 31 %
FEV1-%CHANGE-POST: 0 %
FEV1-%Pred-Post: 57 %
FEV1-%Pred-Pre: 57 %
FEV1-Post: 1.35 L
FEV1-Pre: 1.34 L
FEV1FVC-%Change-Post: 3 %
FEV1FVC-%Pred-Pre: 75 %
FEV6-%Change-Post: 0 %
FEV6-%PRED-PRE: 74 %
FEV6-%Pred-Post: 74 %
FEV6-POST: 2.27 L
FEV6-Pre: 2.29 L
FEV6FVC-%Change-Post: 3 %
FEV6FVC-%PRED-POST: 103 %
FEV6FVC-%Pred-Pre: 100 %
FVC-%CHANGE-POST: -3 %
FVC-%PRED-POST: 72 %
FVC-%PRED-PRE: 75 %
FVC-POST: 2.39 L
FVC-Pre: 2.48 L
POST FEV6/FVC RATIO: 95 %
PRE FEV6/FVC RATIO: 92 %
Post FEV1/FVC ratio: 56 %
Pre FEV1/FVC ratio: 54 %
RV % pred: 143 %
RV: 3.36 L
TLC % pred: 95 %
TLC: 5.78 L

## 2018-03-11 LAB — URINALYSIS, ROUTINE W REFLEX MICROSCOPIC
Bilirubin Urine: NEGATIVE
Glucose, UA: NEGATIVE mg/dL
Hgb urine dipstick: NEGATIVE
Ketones, ur: NEGATIVE mg/dL
LEUKOCYTES UA: NEGATIVE
NITRITE: NEGATIVE
Protein, ur: NEGATIVE mg/dL
SPECIFIC GRAVITY, URINE: 1.015 (ref 1.005–1.030)
pH: 6 (ref 5.0–8.0)

## 2018-03-11 LAB — CBC
HCT: 36.2 % — ABNORMAL LOW (ref 39.0–52.0)
Hemoglobin: 11.6 g/dL — ABNORMAL LOW (ref 13.0–17.0)
MCH: 32 pg (ref 26.0–34.0)
MCHC: 32 g/dL (ref 30.0–36.0)
MCV: 99.7 fL (ref 78.0–100.0)
Platelets: 105 10*3/uL — ABNORMAL LOW (ref 150–400)
RBC: 3.63 MIL/uL — AB (ref 4.22–5.81)
RDW: 14.1 % (ref 11.5–15.5)
WBC: 4.2 10*3/uL (ref 4.0–10.5)

## 2018-03-11 LAB — COMPREHENSIVE METABOLIC PANEL
ALT: 12 U/L (ref 0–44)
ANION GAP: 9 (ref 5–15)
AST: 17 U/L (ref 15–41)
Albumin: 3.7 g/dL (ref 3.5–5.0)
Alkaline Phosphatase: 63 U/L (ref 38–126)
BUN: 18 mg/dL (ref 8–23)
CALCIUM: 9.7 mg/dL (ref 8.9–10.3)
CO2: 22 mmol/L (ref 22–32)
Chloride: 105 mmol/L (ref 98–111)
Creatinine, Ser: 0.76 mg/dL (ref 0.61–1.24)
Glucose, Bld: 124 mg/dL — ABNORMAL HIGH (ref 70–99)
Potassium: 4.3 mmol/L (ref 3.5–5.1)
SODIUM: 136 mmol/L (ref 135–145)
TOTAL PROTEIN: 6.7 g/dL (ref 6.5–8.1)
Total Bilirubin: 1.1 mg/dL (ref 0.3–1.2)

## 2018-03-11 LAB — BLOOD GAS, ARTERIAL
Acid-Base Excess: 2.7 mmol/L — ABNORMAL HIGH (ref 0.0–2.0)
Bicarbonate: 26.3 mmol/L (ref 20.0–28.0)
Drawn by: 449841
FIO2: 21
O2 SAT: 97.4 %
PATIENT TEMPERATURE: 98.6
pCO2 arterial: 37.3 mmHg (ref 32.0–48.0)
pH, Arterial: 7.462 — ABNORMAL HIGH (ref 7.350–7.450)
pO2, Arterial: 83.1 mmHg (ref 83.0–108.0)

## 2018-03-11 LAB — PROTIME-INR
INR: 1.04
Prothrombin Time: 13.5 seconds (ref 11.4–15.2)

## 2018-03-11 LAB — APTT: APTT: 35 s (ref 24–36)

## 2018-03-11 LAB — BRAIN NATRIURETIC PEPTIDE: B Natriuretic Peptide: 164.1 pg/mL — ABNORMAL HIGH (ref 0.0–100.0)

## 2018-03-11 LAB — SURGICAL PCR SCREEN
MRSA, PCR: NEGATIVE
STAPHYLOCOCCUS AUREUS: NEGATIVE

## 2018-03-11 MED ORDER — ALBUTEROL SULFATE (2.5 MG/3ML) 0.083% IN NEBU
2.5000 mg | INHALATION_SOLUTION | Freq: Once | RESPIRATORY_TRACT | Status: AC
  Administered 2018-03-11: 2.5 mg via RESPIRATORY_TRACT

## 2018-03-11 NOTE — Progress Notes (Addendum)
PCP: [name], MD  Cardiologist: Quay Burow, MD  EKG: obtained today per order  Stress test: 04/25/16 in EPIC  ECHO: 01/01/18 in EPIC  Cardiac Cath: 01/25/18 in EPIC  Chest x-ray: obtained today per order

## 2018-03-11 NOTE — Telephone Encounter (Addendum)
Dr. Burt Knack spoke with Dr. Cyndia Bent (see OV note from today).  Instructed patient's wife to have him STOP PLAVIX.  She was grateful for assistance.  Confirmed with patient's daughter as well.

## 2018-03-11 NOTE — Progress Notes (Signed)
Cardiology Office Note:    Date:  03/11/2018   ID:  Robert Schaefer, DOB Apr 05, 1941, MRN [id number]  PCP:  [name], MD  Cardiologist:  Quay Burow, MD  Electrophysiologist:  None   Referring MD: [name], MD   Chief Complaint  Patient presents with  . Shortness of Breath   History of Present Illness:    Robert Schaefer is a 77 y.o. male presenting for TAVR evaluation.  The patient has been followed by Dr. [name] for lower extremity peripheral arterial disease and aortic stenosis.  He has undergone bilateral iliac stenting.  The patient has developed progressive and now critical aortic stenosis with associated symptoms of shortness of breath and fatigue.  He has been evaluated by Dr. Cyndia Bent with plans for TAVR for definitive treatment of his aortic stenosis.  He is undergone extensive evaluation including CTA studies, echo assessment, and cardiac catheterization.  He has no coronary artery disease.  The patient's iliac stents are patent but not of adequate dimensions to allow for transfemoral access for TAVR.  Because of diffuse vascular disease, he will undergo transapical access for TAVR.  The patient is here with his wife and daughter today.  He has occasional chest pain, but primarily is limited by shortness of breath which occurs with just walking up his driveway.  He also has fairly marked fatigue.  He denies frank syncope but has occasional lightheadedness.  He has developed problems related to an esophageal diverticulum and has a lot of difficulty swallowing.  He has lost 50 pounds over the past year or 2.  Past Medical History:  Diagnosis Date  . AAA (abdominal aortic aneurysm) ([place])    found by berry at periphral vascular catherization w/ stenting 05-29-2016--- duplex 06-09-2016 distal AAA measures 3.0cm x 3.1cm  . Arthritis    "maybe in my hands" (01/25/2018)  . Carotid stenosis, asymptomatic, bilateral    per duplex --- bilateral <50% ICA  . Chronic bronchitis  ([place])   . COPD with emphysema [facility])    pulmologist-  dr [name]  . First degree heart block   . GERD (gastroesophageal reflux disease)   . HOH (hard of hearing)   . Hyperlipidemia    no meds normal presently  . Hypertension    pt stopped taking losartan apprx. April 2018 , per pt pcp instructed him to stop  . Hypothyroidism   . IBS (irritable bowel syndrome)    intermittant constipation  . Lesion of left lung    followed by dr [name] (pulmologist)  post infection scarring  . Mild dementia   . OSA (obstructive sleep apnea)    has a cpap-  intolerant; due to Claustaphobia (01/25/2018)  . Peripheral vascular disease [facility])    cardiologist-- dr berry-- per last ABI/Aorta/Iliac 06-09-2016  patent bilateral iliac kissing stents, >50% stenosis bilateral iliac artery (R>L) and left external iliac artery, patent IVC  . Prostate cancer [facility]) urologist-  dr [name]  oncologist-  dr [name]   dx 03/ 2018-- Stage T1c, Gleason 4+5, PSA 8.15, vol 36cc--- currently ADT and planned external radiation  . RLS (restless legs syndrome)   . Schatzki's ring   . Wears partial dentures    upper    Past Surgical History:  Procedure Laterality Date  . ABDOMINAL AORTOGRAM N/A 01/25/2018   Procedure: ABDOMINAL AORTOGRAM;  Surgeon: [name], MD;  Location: [place] CV LAB;  Service: Cardiovascular;  Laterality: N/A;  . ANAL FISTULECTOMY  2010  . CARDIAC CATHETERIZATION Bilateral  01/25/2018  . CARDIOVASCULAR STRESS TEST  04-23-2016   dr berry   normal nuclear study w/ no ischemia/  normal LV function and wall function, nuclear stress ef 57%  . CATARACT EXTRACTION W/ INTRAOCULAR LENS  IMPLANT, BILATERAL Bilateral   . COLONOSCOPY    . ESOPHAGEAL MANOMETRY N/A 10/12/2017   Procedure: ESOPHAGEAL MANOMETRY (EM);  Surgeon: [name], MD;  Location: [facility];  Service: Gastroenterology;  Laterality: N/A;  . ESOPHAGOGASTRODUODENOSCOPY N/A 10/12/2017   Procedure: ESOPHAGOGASTRODUODENOSCOPY (EGD);   Surgeon: [name], MD;  Location: [facility];  Service: Gastroenterology;  Laterality: N/A;  . ESOPHAGOGASTRODUODENOSCOPY (EGD) WITH ESOPHAGEAL DILATION  last one 11-09-2015  dr Yolanda Bonine   dilation schatzki's ring  . GOLD SEED IMPLANT N/A 04/29/2017   Procedure: GOLD SEED IMPLANT;  Surgeon: [name], MD;  Location: [facility];  Service: Urology;  Laterality: N/A;  . HERNIA REPAIR    . INGUINAL HERNIA REPAIR Right 02/23/2014   Procedure: HERNIA REPAIR INGUINAL ADULT;  Surgeon: Joyice Faster. Cornett, MD;  Location: [place];  Service: General;  Laterality: Right;  . INSERTION OF MESH Right 02/23/2014   Procedure: INSERTION OF MESH;  Surgeon: Joyice Faster. Cornett, MD;  Location: [place];  Service: General;  Laterality: Right;  . PERIPHERAL VASCULAR CATHETERIZATION N/A 05/29/2016   Procedure: Lower Extremity Angiography;  Surgeon: [name], MD;  Location: [place] CV LAB;  Service: Cardiovascular;  Laterality: N/A; Diamondback orbital rotational atherectomy, PTA and stenting bilateral common iliac arteries   . PILONIDAL CYST / SINUS EXCISION  2000  . PROSTATE BIOPSY  09-22-2016  dr [name] office  . RIGHT/LEFT HEART CATH AND CORONARY ANGIOGRAPHY N/A 01/25/2018   Procedure: RIGHT/LEFT HEART CATH AND CORONARY ANGIOGRAPHY;  Surgeon: [name], MD;  Location: [place] [facility];  Service: Cardiovascular;  Laterality: N/A;  . SAVORY DILATION N/A 10/12/2017   Procedure: SAVORY DILATION;  Surgeon: [name], MD;  Location: [facility];  Service: Gastroenterology;  Laterality: N/A;  . SPACE OAR INSTILLATION N/A 04/29/2017   Procedure: SPACE OAR INSTILLATION;  Surgeon: [name], MD;  Location: [facility];  Service: Urology;  Laterality: N/A;  . TRANSTHORACIC ECHOCARDIOGRAM  05-05-2016   dr berry   ef 58-09%,  grade 1diastolic dysfunction/  moderate AV stenosis with mod. AR (right coronary and  noncoronary cusp mobility seversly restricted);  valve area 1.25cm^2, mean gradient 27 mmHg, peak gradient 65mmHg/  mild MR  . UPPER GASTROINTESTINAL ENDOSCOPY      Current Medications: Current Meds  Medication Sig  . albuterol (PROVENTIL HFA;VENTOLIN HFA) 108 (90 Base) MCG/ACT inhaler Inhale 1 puff into the lungs every 6 (six) hours as needed for wheezing or shortness of breath.  . alfuzosin (UROXATRAL) 10 MG 24 hr tablet Take 10 mg by mouth daily. After lunch  . aspirin EC 81 MG tablet Take 81 mg by mouth daily.  . Cholecalciferol (VITAMIN D-3) 5000 units TABS Take 5,000 Units by mouth daily at 3 pm.  . clopidogrel (PLAVIX) 75 MG tablet Take 75 mg by mouth daily at 3 pm.   . feeding supplement, ENSURE ENLIVE, (ENSURE ENLIVE) LIQD Take 237 mLs by mouth 3 (three) times daily between meals.  . fluticasone (FLONASE) 50 MCG/ACT nasal spray Place 2 sprays into both nostrils daily as needed for allergies or rhinitis.   [name] levothyroxine (SYNTHROID, LEVOTHROID) 50 MCG tablet Take 50 mcg by mouth daily before breakfast.     Allergies:  Penicillins; Sulfa antibiotics; and Versed [midazolam]   Social History   Socioeconomic History  . Marital status: Married    Spouse name: Not on file  . Number of children: Not on file  . Years of education: Not on file  . Highest education level: Not on file  Occupational History  . Not on file  Social Needs  . Financial resource strain: Not on file  . Food insecurity:    Worry: Not on file    Inability: Not on file  . Transportation needs:    Medical: Not on file    Non-medical: Not on file  Tobacco Use  . Smoking status: Former Smoker    Packs/day: 1.00    Years: 50.00    Pack years: 50.00    Types: Cigarettes    Last attempt to quit: 07/05/2005    Years since quitting: 12.6  . Smokeless tobacco: Never Used  Substance and Sexual Activity  . Alcohol use: No  . Drug use: Never  . Sexual activity: Not Currently  Lifestyle  . Physical activity:     Days per week: Not on file    Minutes per session: Not on file  . Stress: Not on file  Relationships  . Social connections:    Talks on phone: Not on file    Gets together: Not on file    Attends religious service: Not on file    Active member of club or organization: Not on file    Attends meetings of clubs or organizations: Not on file    Relationship status: Not on file  Other Topics Concern  . Not on file  Social History Narrative  . Not on file     Family History: The patient's family history includes Asthma in his father; Heart attack in his mother; Stroke in his father. There is no history of Cancer, Colon cancer, Stomach cancer, Esophageal cancer, Rectal cancer, Pancreatic cancer, or Prostate cancer.  ROS:   Please see the history of present illness.    Positive for weight loss, chills, hearing loss, cough, dizziness, easy bruising, excessive fatigue, leg pain, snoring, constipation, balance problems.  All other systems reviewed and are negative.  EKGs/Labs/Other Studies Reviewed:    The following studies were reviewed today: Gated cardiac CTA 02-17-2018: FINDINGS: Aortic Valve: Calcified and tri leaflet with restricted motion  Aorta: Mild calcific aortic debris No aneurysm normal arch vessels  Sino-tubular Junction: 27 mm  Ascending Thoracic Aorta: 31 mm  Aortic Arch: 29 mm  Descending Thoracic Aorta: 23 mm  Sinus of Valsalva Measurements:  Non-coronary: 29.5 mm  Right - coronary: 32 mm  Left -   coronary: 32 mm  Coronary Artery Height above Annulus:  Left Main: 10.8 mm above annulus  Right Coronary: 12.5 mm above annulus  Virtual Basal Annulus Measurements:  Maximum / Minimum Diameter: 25.4 mm x 22 mm  Perimeter: 78 mm  Area: 472 mm2  Coronary Arteries: LM a bit shallow but should be ok for deployment  Optimum Fluoroscopic Angle for Delivery: LAO 7 degrees Cranial 4 degrees  IMPRESSION: 1. Calcified trileaflet AV  with annular area of 472 mm2 suitable for a 26 mm Sapien 3 valve  2.  No LAA thrombus  3. Optimum angiographic angle for deployment LAO 7 Cranial 4 degrees  4. LM a bit shallow at 10.8 mm above annulus RCA 12.5 mm above annulus  5.  Normal aortic root 3.1 cm  CTA chest/abdomen/pelvis: AORTA:  Minimal Aortic Diameter-16 x 16 mm  Severity of Aortic Calcification-severe  RIGHT PELVIS:  Right Common Iliac Artery -  Comment - Stent in place.  Minimal Diameter-3.5 x 3.7 mm  Tortuosity-mild  Calcification-severe with stent  Right External Iliac Artery -  Minimal Diameter-6.5 x 5.6 mm  Tortuosity-mild-to-moderate  Calcification-mild  Right Common Femoral Artery -  Minimal Diameter-6.0 x 3.7 mm  Tortuosity-mild  Calcification-moderate  LEFT PELVIS:  Comment - Stent in place.  Left Common Iliac Artery -  Minimal Diameter-4.4 x 3.7 mm  Tortuosity-mild  Calcification-severe with stent  Left External Iliac Artery -  Minimal Diameter-7.4 x 4.0 mm  Tortuosity-mild-to-moderate  Calcification-mild  Left Common Femoral Artery -  Minimal Diameter-5.5 x 4.7 mm  Tortuosity-mild  Calcification-moderate  Review of the MIP images confirms the above findings.  IMPRESSION: 1. Vascular findings and measurements pertinent to potential TAVR procedure, as detailed above. 2. Severe thickening calcification of the aortic valve, compatible with the reported clinical history of severe aortic stenosis. 3. Aortic atherosclerosis, in addition to left main and 3 vessel coronary artery disease. There is also aneurysmal dilatation of the infrarenal abdominal aorta which measures up to 3 cm in diameter. There also several ulcerated plaques, which may have implications for passage of catheter during procedure. 4. Mild cardiomegaly. 5. Severe circumferential thickening of much of the thoracic esophagus, favored to be related to  chronic esophagitis, however, the possibility of neoplasm is not entirely excluded. In addition, there is a large distal esophageal diverticulum. Further evaluation with endoscopy and consideration for biopsy is suggested in the near future to exclude malignancy. 6. Aortic atherosclerosis, in addition to left main and 3 vessel coronary artery disease. Assessment for potential risk factor modification, dietary therapy or pharmacologic therapy may be warranted, if clinically indicated. 7. Similar appearing area of nodular architectural distortion in the left upper lobe, most compatible with a resolving area of post infectious or inflammatory scarring. 8. Additional incidental findings, as above.  Cardiac Cath:  CARDIAC CATHETERIZATION     History obtained from chart review.Dorean Priceis a 77 y.o.married Caucasian male father of 2 children, grandfather 5 grandchildren who is covering by his wife [name] He was referred by Dr. Melford Aase for cardiovascular evaluation because of chest pain and claudication. I last saw him in the office3/20/2018. His cardiovascular risk factor profile is notable for 100 pack years of tobacco abuse having quit 10 years ago. He has treated hypertension and hyperlipidemia. There is a family history of heart disease with a mother who died of a myocardial infarction age 68. He has never had a heart attack or stroke. He has had recent chest pain with a CT angiogram that ruled out pulmonary embolus but did show diffuse coronary calcification. An abdominal pelvic CT performed because of unexplained weight loss showed aortoiliac calcification size/atherosclerosis as well as atherosclerosis of the superior mesenteric artery and left renal artery. He complains of right greater than leftleg claudication of the last year occurring after walking 4-5 minutes. Since I saw him in the office he's had a Myoview stress test which was nonischemic problem, a 2-D echo that showed  normal LV function with moderate aortic stenosis, carotid Dopplers that showed no evidence of ICA stenosis and lower extremity Dopplers reveal a right ABI of 0.48 with an occluded right common iliac and a left ABI of 1.23 with moderate left common iliac artery stenosis. He also complains a 30 pound weight loss of and had SMA calcification on abdominal CT. He may have mesenteric ischemia as well.He underwent peripheral angiography and  intervention by myself 05/29/16 demonstrating a small abdominal aortic aneurysm, 95% calcified ostial superior mesenteric artery stenosis, right common iliac chronic total occlusion was calcified and high-grade common iliac artery stenosis. He underwent diamondback orbital rotational atherectomy, PTA and covered stenting using Lifestream covered stents" of both common iliac arteries using "kissing stent technique. His claudication symptoms have resolved. The ABIs are normal. She does have a high-frequency signal in his right common iliac artery demonstrated a duplex ultrasound 06/09/16 was asymptomatic from this.  Since I saw him last year he is done well. He does now have achalasia and is unable to eat and is losing weight. He has had syncope in the past as well as generalized fatigue. Recent 2D echo performed 01/01/2018 showed critical aortic stenosis with a valve area 0.42 cm and a peak aortic gradient of 120 mmHg.  He presents today for elective outpatient right and left heart cath to define his anatomy and physiology.    IMPRESSION: Mr. Starnes has normal coronary arteries and critical aortic stenosis.  His iliac stents are widely patent.  Given his age and elevated blood pressure I think it is prudent to keep him overnight for observation.  He will be gently hydrated and discharged home in the morning.  I will refer him to  TCT S for surgical evaluation for aortic valve replacement.  The sheath were removed and pressure held in the groin to achieve hemostasis.  The  patient left the lab in stable condition.  Quay Burow. MD, [facility] 01/25/2018 3:41 PM        Indications   Critical aortic valve stenosis [I35.0 (ICD-10-CM)]  Procedural Details/Technique   Technical Details PROCEDURE DESCRIPTION:   The patient was brought to the second floor [place] [facility]iac cath lab in the postabsorptive state. He was not premedicated . His groin was prepped and shaved in usual sterile fashion. Xylocaine 1% was used for local anesthesia. A 6 French sheath was inserted into the common femoral artery using standard Seldinger technique. A 7 French sheath was inserted into the right common femoral vein. A 7 [place] balloontipped thermal dilution Swan-Ganz catheter was used to obtain sequential right heart pressures and obtain blood samples for the determination of Fick and thermal dilution cardiac outputs. 5 French right and left Judkins diagnostic catheters along with 5 French pigtail catheter were used for selective coronary angiography, crossing the aortic valve with a straight wire and right Judkins catheter and distal abdominal aortography. Isovue dye was used for the entirety of the case. Retrograde aorta, left ventricular and pullback pressures were recorded.   Estimated blood loss <50 mL.  During this procedure no sedation was administered.  Coronary Findings   Diagnostic  Dominance: Right  No diagnostic findings have been documented.  Intervention   No interventions have been documented.  Right Heart   Right Heart Pressures Hemodynamic findings consistent with aortic valve stenosis. Right atrial pressure- 6/4 Right ventricular pressure-34/1 Pulmonary artery pressure- 37/14, mean 22 Pulmonary wedge pressure- A-wave 18, V wave 17, mean 13 LVEDP- 23 Cardiac output by Fick- 5.3 L/min, thermodilution-8 L/min Aortic valve gradient- 50 mmHg Aortic valve area (Fick) 0.55 cm  Coronary Diagrams   Diagnostic Diagram         EKG:  EKG is not  ordered today.    Recent Labs: 04/27/2017: ALT 15 01/26/2018: BUN 8; Creatinine, Ser 0.76; Hemoglobin 11.6; Platelets 106; Potassium 4.1; Sodium 141  Recent Lipid Panel    Component Value Date/Time   CHOL 141 02/26/2017 1046  TRIG 58 02/26/2017 1046   HDL 50 02/26/2017 1046   CHOLHDL 2.8 02/26/2017 1046   VLDL 19 09/15/2016 1729   LDLCALC 78 02/26/2017 1046    Physical Exam:    VS:  BP (!) 112/50   Pulse 85   Ht 5\' 5"  (1.651 m)   Wt 139 lb 12.8 oz (63.4 kg)   SpO2 96%   BMI 23.26 kg/m     Wt Readings from Last 3 Encounters:  03/11/18 139 lb 4.8 oz (63.2 kg)  03/11/18 139 lb 12.8 oz (63.4 kg)  03/04/18 140 lb (63.5 kg)     GEN: elderly, frail appearing male in no acute distress HEENT: Normal NECK: No JVD; BL carotid bruits LYMPHATICS: No lymphadenopathy CARDIAC: RRR, with a 4/6 harsh late peaking systolic murmur loudest at the apex, absent A2 RESPIRATORY:  Clear to auscultation without rales, wheezing or rhonchi  ABDOMEN: Soft, non-tender, non-distended MUSCULOSKELETAL:  No edema; No deformity  SKIN: Warm and dry NEUROLOGIC:  Alert and oriented x 3 PSYCHIATRIC:  Normal affect   ASSESSMENT:    1. Severe aortic stenosis    PLAN:    In order of problems listed above:  The patient has severe stage D1 aortic stenosis with severely elevated transvalvular gradients consistent with critical aortic stenosis.  He has progressive now New York Heart Association functional class III symptoms of exertional dyspnea and generalized fatigue.  I have reviewed the natural history of aortic stenosis with the patient and their family members  who are present today. We have discussed the limitations of medical therapy and the poor prognosis associated with symptomatic aortic stenosis. We have reviewed potential treatment options, including palliative medical therapy, conventional surgical aortic valve replacement, and transcatheter aortic valve replacement. We discussed treatment options  in the context of the patient's specific comorbid medical conditions.  I have personally reviewed his echo and CTA images.  I agree that he does not have adequate transfemoral access for TAVR.  I reviewed the procedural aspects of transapical TAVR at length with the patient and his family today.  They understand the associated risks, indications, and alternatives.  I will discuss with Dr. Cyndia Bent whether he would like the patient to hold clopidogrel prior to the procedure.  He is scheduled for transapical TAVR 03/16/2018.  Full informed consent is obtained.   Medication Adjustments/Labs and Tests Ordered: Current medicines are reviewed at length with the patient today.  Concerns regarding medicines are outlined above.  No orders of the defined types were placed in this encounter.  No orders of the defined types were placed in this encounter.   There are no Patient Instructions on file for this visit.   Signed, Sherren Mocha, MD  03/11/2018 2:00 PM    [place]

## 2018-03-11 NOTE — Pre-Procedure Instructions (Signed)
AYDYN TESTERMAN  03/11/2018      [facility] [place] - [address] [place] [address] Phone: [phone] Fax: [phone]  [facility] [place], [place] AT [address] [place] [address] Phone: [phone] Fax: [phone]    Your procedure is scheduled on March 16, 2018.  Report to [facility] Admitting at 530 AM.  Call this number if you have problems the morning of surgery:  [phone]   Remember:  Do not eat or drink after midnight.    Take these medicines the morning of surgery with A SIP OF WATER-none  Continue Aspirin and Plavix as instructed by your surgeon  7 days prior to surgery STOP taking any Aleve, Naproxen, Ibuprofen, Motrin, Advil, Goody's, BC's, all herbal medications, fish oil, and all vitamins   Contacts, dentures or bridgework may not be worn into surgery.  Leave your suitcase in the car.  After surgery it may be brought to your room.  For patients admitted to the hospital, discharge time will be determined by your treatment team.  Patients discharged the day of surgery will not be allowed to drive home.    [place]- Preparing For Surgery  Before surgery, you can play an important role. Because skin is not sterile, your skin needs to be as free of germs as possible. You can reduce the number of germs on your skin by washing with CHG (chlorahexidine gluconate) Soap before surgery.  CHG is an antiseptic cleaner which kills germs and bonds with the skin to continue killing germs even after washing.    Oral Hygiene is also important to reduce your risk of infection.  Remember - BRUSH YOUR TEETH THE MORNING OF SURGERY WITH YOUR REGULAR TOOTHPASTE  Please do not use if you have an allergy to CHG or antibacterial soaps. If your skin becomes reddened/irritated stop using the CHG.  Do not  shave (including legs and underarms) for at least 48 hours prior to first CHG shower. It is OK to shave your face.  Please follow these instructions carefully.   1. Shower the NIGHT BEFORE SURGERY and the MORNING OF SURGERY with CHG.   2. If you chose to wash your hair, wash your hair first as usual with your normal shampoo.  3. After you shampoo, rinse your hair and body thoroughly to remove the shampoo.  4. Use CHG as you would any other liquid soap. You can apply CHG directly to the skin and wash gently with a scrungie or a clean washcloth.   5. Apply the CHG Soap to your body ONLY FROM THE NECK DOWN.  Do not use on open wounds or open sores. Avoid contact with your eyes, ears, mouth and genitals (private parts). Wash Face and genitals (private parts)  with your normal soap.  6. Wash thoroughly, paying special attention to the area where your surgery will be performed.  7. Thoroughly rinse your body with warm water from the neck down.  8. DO NOT shower/wash with your normal soap after using and rinsing off the CHG Soap.  9. Pat yourself dry with a CLEAN TOWEL.  10. Wear CLEAN PAJAMAS to bed the night before surgery, wear comfortable clothes the morning of surgery  11. Place CLEAN SHEETS on your bed the night of your first shower and DO NOT SLEEP WITH  PETS.  Day of Surgery:  Do not apply any deodorants/lotions.  Please wear clean clothes to the hospital/surgery center.   Remember to brush your teeth WITH YOUR REGULAR TOOTHPASTE.   Do not wear jewelry  Do not wear lotions, powders, or colognes, or deodorant.  Men may shave face and neck the morning of surgery  Do not bring valuables to the hospital.   [facility] is not responsible for any belongings or valuables.  Please read over the fact sheets that you were given.

## 2018-03-11 NOTE — Therapy (Signed)
[place], [place], [address] Phone: [phone]   Fax:  [phone]  Physical Therapy Evaluation  Patient Details  Name: Robert Schaefer MRN: [id number] Date of Birth: Oct 05, 1940 Referring Provider: Dr. Gilford Raid   Encounter Date: 03/11/2018  PT End of Session - 03/11/18 0955    Visit Number  1    PT Start Time  3300    PT Stop Time  1025    PT Time Calculation (min)  32 min       Past Medical History:  Diagnosis Date  . AAA (abdominal aortic aneurysm) ([place])    found by berry at periphral vascular catherization w/ stenting 05-29-2016--- duplex 06-09-2016 distal AAA measures 3.0cm x 3.1cm  . Arthritis    "maybe in my hands" (01/25/2018)  . Carotid stenosis, asymptomatic, bilateral    per duplex --- bilateral <50% ICA  . Chronic bronchitis ([place])   . COPD with emphysema [facility])    pulmologist-  dr [name]  . First degree heart block   . GERD (gastroesophageal reflux disease)   . HOH (hard of hearing)   . Hyperlipidemia    no meds normal presently  . Hypertension    pt stopped taking losartan apprx. April 2018 , per pt pcp instructed him to stop  . Hypothyroidism   . IBS (irritable bowel syndrome)    intermittant constipation  . Lesion of left lung    followed by dr [name] (pulmologist)  post infection scarring  . Mild dementia   . OSA (obstructive sleep apnea)    has a cpap-  intolerant; due to Claustaphobia (01/25/2018)  . Peripheral vascular disease [facility])    cardiologist-- dr berry-- per last ABI/Aorta/Iliac 06-09-2016  patent bilateral iliac kissing stents, >50% stenosis bilateral iliac artery (R>L) and left external iliac artery, patent IVC  . Prostate cancer [facility]) urologist-  dr [name]  oncologist-  dr [name]   dx 03/ 2018-- Stage T1c, Gleason 4+5, PSA 8.15, vol 36cc--- currently ADT and planned external radiation  . RLS (restless legs syndrome)   . Schatzki's ring   . Wears partial dentures     upper    Past Surgical History:  Procedure Laterality Date  . ABDOMINAL AORTOGRAM N/A 01/25/2018   Procedure: ABDOMINAL AORTOGRAM;  Surgeon: [name], MD;  Location: [place] [facility];  Service: Cardiovascular;  Laterality: N/A;  . ANAL FISTULECTOMY  2010  . CARDIAC CATHETERIZATION Bilateral 01/25/2018  . CARDIOVASCULAR STRESS TEST  04-23-2016   dr berry   normal nuclear study w/ no ischemia/  normal LV function and wall function, nuclear stress ef 57%  . CATARACT EXTRACTION W/ INTRAOCULAR LENS  IMPLANT, BILATERAL Bilateral   . COLONOSCOPY    . ESOPHAGEAL MANOMETRY N/A 10/12/2017   Procedure: ESOPHAGEAL MANOMETRY (EM);  Surgeon: [name], MD;  [facility];  Service: Gastroenterology;  Laterality: N/A;  . ESOPHAGOGASTRODUODENOSCOPY N/A 10/12/2017   Procedure: ESOPHAGOGASTRODUODENOSCOPY (EGD);  Surgeon: [name], MD;  Location: [facility];  Service: Gastroenterology;  Laterality: N/A;  . ESOPHAGOGASTRODUODENOSCOPY (EGD) WITH ESOPHAGEAL DILATION  last one 11-09-2015  dr Yolanda Bonine   dilation schatzki's ring  . GOLD SEED IMPLANT N/A 04/29/2017   Procedure: GOLD SEED IMPLANT;  Surgeon: [name], MD;  Location: [facility];  Service: Urology;  Laterality: N/A;  . HERNIA REPAIR    . INGUINAL HERNIA REPAIR Right 02/23/2014   Procedure: HERNIA REPAIR INGUINAL ADULT;  Surgeon: Joyice Faster. Cornett, MD;  Location: [place];  Service: General;  Laterality: Right;  . INSERTION OF MESH Right 02/23/2014   Procedure: INSERTION OF MESH;  Surgeon: Joyice Faster. Cornett, MD;  Location: [place];  Service: General;  Laterality: Right;  . PERIPHERAL VASCULAR CATHETERIZATION N/A 05/29/2016   Procedure: Lower Extremity Angiography;  Surgeon: [name], MD;  Location: [place] [facility];  Service: Cardiovascular;  Laterality: N/A; [place] orbital rotational atherectomy, PTA and stenting bilateral common iliac  arteries   . PILONIDAL CYST / SINUS EXCISION  2000  . PROSTATE BIOPSY  09-22-2016  dr [name] office  . RIGHT/LEFT HEART CATH AND CORONARY ANGIOGRAPHY N/A 01/25/2018   Procedure: RIGHT/LEFT HEART CATH AND CORONARY ANGIOGRAPHY;  Surgeon: [name], MD;  Location: [place] [facility];  Service: Cardiovascular;  Laterality: N/A;  . SAVORY DILATION N/A 10/12/2017   Procedure: SAVORY DILATION;  Surgeon: [name], MD;  [facility];  Service: Gastroenterology;  Laterality: N/A;  . SPACE OAR INSTILLATION N/A 04/29/2017   Procedure: SPACE OAR INSTILLATION;  Surgeon: [name], MD;  Location: [facility];  Service: Urology;  Laterality: N/A;  . TRANSTHORACIC ECHOCARDIOGRAM  05-05-2016   dr berry   ef 10-93%,  grade 1diastolic dysfunction/  moderate AV stenosis with mod. AR (right coronary and noncoronary cusp mobility seversly restricted);  valve area 1.25cm^2, mean gradient 27 mmHg, peak gradient 26mmHg/  mild MR  . UPPER GASTROINTESTINAL ENDOSCOPY      There were no vitals filed for this visit.   Subjective Assessment - 03/11/18 0956    Subjective  reports exertional shortness of breath and fatigue porgressively worsening over the past year, frequent dizzy spells and occ brief chest pain; ow has signficant difficulty walking up the hill from his mailbox    Patient Stated Goals  to fix heart    Currently in Pain?  No/denies         [facility] PT Assessment - 03/11/18 0001      Assessment   Medical Diagnosis  critical aortic stenosis    Referring Provider  Dr. Gilford Raid    Onset Date/Surgical Date  --   approximately a year ago     Precautions   Precautions  None      Restrictions   Weight Bearing Restrictions  No      Balance Screen   Has the patient fallen in the past 6 months  Yes    How many times?  3-4   loses of balance   Has the patient had a decrease in activity level because of a fear of falling?   No    Is the patient reluctant  to leave their home because of a fear of falling?   No      Home Environment   Living Environment  Private residence    Living Arrangements  Spouse/significant other    Home Access  Stairs to enter    Entrance Stairs-Number of Steps  3    Entrance Stairs-Rails  Left    [place]  One level    [place] seat      Prior Function   Level of Independence  Independent with community mobility without device      Posture/Postural Control   Posture/Postural Control  Postural limitations    Postural Limitations  Rounded Shoulders;Forward head   moderate     ROM / Strength   AROM / PROM / Strength  AROM;Strength  AROM   Overall AROM Comments  grossly WFL      Strength   Overall Strength Comments  grossly 4 to 5/5 UE and lower extremities    Strength Assessment Site  Hand    Right/Left hand  Right;Left    Right Hand Grip (lbs)  25   R hand dominant   Left Hand Grip (lbs)  30      Ambulation/Gait   Gait Comments  Pt demonstrates forward flexed posture and shuffling gait. Gait distance limited by 56% for age/gender.        [facility] Pre-Surgical Assessment - 03/11/18 0001    5 Meter Walk Test- trial 1  9 sec    5 Meter Walk Test- trial 2  8 sec.     5 Meter Walk Test- trial 3  7 sec.    5 meter walk test average  8 sec    4 Stage Balance Test tolerated for:   10 sec.    4 Stage Balance Test Position  2    Sit To Stand Test- trial 1  15 sec.    ADL/IADL Independent with:  Bathing;Dressing;Finances    ADL/IADL Needs Assistance with:  Yard work;Meal prep    6 Minute Walk- Baseline  yes    BP (mmHg)  (!) 100/39    HR (bpm)  73    02 Sat (%RA)  96 %    Modified Borg Scale for Dyspnea  0- Nothing at all    Perceived Rate of Exertion (Borg)  6-    6 Minute Walk Post Test  yes    BP (mmHg)  130/45    HR (bpm)  94    02 Sat (%RA)  95 %    Modified Borg Scale for Dyspnea  0- Nothing at all    Perceived Rate of Exertion (Borg)  11- Fairly light    Aerobic Endurance  Distance Walked  760              Objective measurements completed on examination: See above findings.              PT Education - 03/11/18 1034    Education Details  fall risk, potential benefits of PT after TAVR to address balance deficits and fall risk    Person(s) Educated  Patient;Child(ren);Spouse    Methods  Explanation    Comprehension  Verbalized understanding                  Plan - 03/11/18 0955    Clinical Impression Statement  see below      Clinical Impression Statement: Pt is a 77 yo male presenting to OP PT for evaluation prior to possible TAVR surgery due to severe aortic stenosis. Pt reports onset of exertional shortness of breath and fatigue approximately 12 months ago which has progressively worsened. Symptoms are limiting his general mobility around the house and yard. Pt presents with good ROM and strength, poor balance and is at high fall risk 4 stage balance test, slow walking speed and poor aerobic endurance per 6 minute walk test. Pt ambulated a total of 760 feet in 6 minute walk. Based on the Short Physical Performance Battery, patient has a frailty rating of 6/12 with </= 5/12 considered frail.   Patient demonstrated the following deficits and impairments:     Visit Diagnosis: Other abnormalities of gait and mobility  Abnormal posture     Problem List Patient Active Problem List   Diagnosis Date Noted  .  Senile purpura ([place]) 03/04/2018  . Esophageal stricture   . Malignant neoplasm of prostate ([place]) 03/03/2017  . Mesenteric ischemia ([place]) 09/16/2016  . Atherosclerosis of aorta ([place]) 09/15/2016  . Hiatal hernia 09/03/2016  . Cavitating mass in left upper lung lobe 08/12/2016  . PVD (peripheral vascular disease) ([place]) 05/31/2016  . Aortic stenosis, moderate 05/27/2016  . Claudication ([place]) 04/16/2016  . Hypothyroidism 11/07/2014  . BPH w/LUTS 11/07/2014  . Medication management 04/11/2014  . Vitamin D deficiency  07/05/2013  . Hyperlipidemia   . Prediabetes   . COPD with asthma ([place])   . IBS (irritable bowel syndrome)   . RLS (restless legs syndrome)   . OSA and COPD overlap syndrome ([place])   . Essential hypertension 09/28/2009  . GERD 09/28/2009    [place], PT 03/11/2018, 10:36 AM  [facility] [address] [place], [place], [address] Phone: [phone]   Fax:  [phone]  Name: BREKKEN BEACH MRN: [id number] Date of Birth: 07-26-1940

## 2018-03-11 NOTE — Progress Notes (Signed)
*  Preliminary Results* Carotid artery duplex has been completed. Bilateral internal carotid arteries are 1-39%. Vertebral arteries are patent with antegrade flow.  03/11/2018 3:30 PM  Jinny Blossom Dawna Part

## 2018-03-12 LAB — HEMOGLOBIN A1C
HEMOGLOBIN A1C: 4.9 % (ref 4.8–5.6)
MEAN PLASMA GLUCOSE: 94 mg/dL

## 2018-03-12 NOTE — Telephone Encounter (Signed)
This encounter was created in error - please disregard.

## 2018-03-12 NOTE — Progress Notes (Signed)
Anesthesia Chart Review:   Case:  [id number] Date/Time:  03/16/18 0715   Procedure:  TRANSCATHETER AORTIC VALVE REPLACEMENT, TRANSAPICAL (N/A )   Anesthesia type:  General   Pre-op diagnosis:  Severe Aortic Stenosis   Location:  [facility] OR ROOM 79 / [place] [facility]   Surgeon:  Sherren Mocha, MD      DISCUSSION: - Pt is a 77 year old male with hx severe aortic stenosis, AAA (2.8 x 3.2 cm by 02/17/18 CTA), HTN, COPD, OSA  - See Dr. Antionette Char note 03/11/18 for complete background and test result summary.   - Pt was instructed on 03/11/18 by Dr. Antionette Char office to stop plavix.    VS: BP (!) 118/56   Pulse 77   Temp 36.9 C (Oral)   Resp 18   Ht 5\' 5"  (1.651 m)   Wt 63.2 kg   SpO2 98%   BMI 23.18 kg/m    PROVIDERS: - PCP is [name], MD  - Cardiologist is Quay Burow, MD   LABS: Labs reviewed: Acceptable for surgery. (all labs ordered are listed, but only abnormal results are displayed)  Labs Reviewed  BLOOD GAS, ARTERIAL - Abnormal; Notable for the following components:      Result Value   pH, Arterial 7.462 (*)    Acid-Base Excess 2.7 (*)    All other components within normal limits  BRAIN NATRIURETIC PEPTIDE - Abnormal; Notable for the following components:   B Natriuretic Peptide 164.1 (*)    All other components within normal limits  CBC - Abnormal; Notable for the following components:   RBC 3.63 (*)    Hemoglobin 11.6 (*)    HCT 36.2 (*)    Platelets 105 (*)    All other components within normal limits  COMPREHENSIVE METABOLIC PANEL - Abnormal; Notable for the following components:   Glucose, Bld 124 (*)    All other components within normal limits  SURGICAL PCR SCREEN  APTT  HEMOGLOBIN A1C  PROTIME-INR  URINALYSIS, ROUTINE W REFLEX MICROSCOPIC  TYPE AND SCREEN     IMAGES:  CXR 03/11/18: COPD/chronic changes.  No active disease   EKG 03/11/18: NSR   CV:  Carotid duplex 03/11/18:  - Right Carotid: Velocities in the right ICA are consistent with a 1-39%  stenosis. - Left Carotid: Velocities in the left ICA are consistent with a 1-39% stenosis. - Vertebrals: Bilateral vertebral arteries demonstrate antegrade flow.   Past Medical History:  Diagnosis Date  . AAA (abdominal aortic aneurysm) ([place])    found by berry at periphral vascular catherization w/ stenting 05-29-2016--- duplex 06-09-2016 distal AAA measures 3.0cm x 3.1cm  . Arthritis    "maybe in my hands" (01/25/2018)  . Carotid stenosis, asymptomatic, bilateral    per duplex --- bilateral <50% ICA  . Chronic bronchitis ([place])   . COPD with emphysema [facility])    pulmologist-  dr [name]  . First degree heart block   . GERD (gastroesophageal reflux disease)   . HOH (hard of hearing)   . Hyperlipidemia    no meds normal presently  . Hypertension    pt stopped taking losartan apprx. April 2018 , per pt pcp instructed him to stop  . Hypothyroidism   . IBS (irritable bowel syndrome)    intermittant constipation  . Lesion of left lung    followed by dr [name] (pulmologist)  post infection scarring  . Mild dementia   . OSA (obstructive sleep apnea)    has a cpap-  intolerant; due  to Claustaphobia (01/25/2018)  . Peripheral vascular disease [facility])    cardiologist-- dr berry-- per last ABI/Aorta/Iliac 06-09-2016  patent bilateral iliac kissing stents, >50% stenosis bilateral iliac artery (R>L) and left external iliac artery, patent IVC  . Prostate cancer [facility]) urologist-  dr [name]  oncologist-  dr [name]   dx 03/ 2018-- Stage T1c, Gleason 4+5, PSA 8.15, vol 36cc--- currently ADT and planned external radiation  . RLS (restless legs syndrome)   . Schatzki's ring   . Wears partial dentures    upper    Past Surgical History:  Procedure Laterality Date  . ABDOMINAL AORTOGRAM N/A 01/25/2018   Procedure: ABDOMINAL AORTOGRAM;  Surgeon: [name], MD;  Location: [place] [facility];  Service: Cardiovascular;  Laterality: N/A;  . ANAL FISTULECTOMY  2010  . CARDIAC CATHETERIZATION Bilateral  01/25/2018  . CARDIOVASCULAR STRESS TEST  04-23-2016   dr berry   normal nuclear study w/ no ischemia/  normal LV function and wall function, nuclear stress ef 57%  . CATARACT EXTRACTION W/ INTRAOCULAR LENS  IMPLANT, BILATERAL Bilateral   . COLONOSCOPY    . ESOPHAGEAL MANOMETRY N/A 10/12/2017   Procedure: ESOPHAGEAL MANOMETRY (EM);  Surgeon: [name], MD;  [facility];  Service: Gastroenterology;  Laterality: N/A;  . ESOPHAGOGASTRODUODENOSCOPY N/A 10/12/2017   Procedure: ESOPHAGOGASTRODUODENOSCOPY (EGD);  Surgeon: [name], MD;  Location: [facility];  Service: Gastroenterology;  Laterality: N/A;  . ESOPHAGOGASTRODUODENOSCOPY (EGD) WITH ESOPHAGEAL DILATION  last one 11-09-2015  dr Yolanda Bonine   dilation schatzki's ring  . GOLD SEED IMPLANT N/A 04/29/2017   Procedure: GOLD SEED IMPLANT;  Surgeon: [name], MD;  Location[facility];  Service: Urology;  Laterality: N/A;  . HERNIA REPAIR    . INGUINAL HERNIA REPAIR Right 02/23/2014   Procedure: HERNIA REPAIR INGUINAL ADULT;  Surgeon: Joyice Faster. Cornett, MD;  Location: [place];  Service: General;  Laterality: Right;  . INSERTION OF MESH Right 02/23/2014   Procedure: INSERTION OF MESH;  Surgeon: Joyice Faster. Cornett, MD;  Location: [place];  Service: General;  Laterality: Right;  . PERIPHERAL VASCULAR CATHETERIZATION N/A 05/29/2016   Procedure: Lower Extremity Angiography;  Surgeon: [name], MD;  Location: [place] [facility];  Service: Cardiovascular;  Laterality: N/A; [place] orbital rotational atherectomy, PTA and stenting bilateral common iliac arteries   . PILONIDAL CYST / SINUS EXCISION  2000  . PROSTATE BIOPSY  09-22-2016  dr [name] office  . RIGHT/LEFT HEART CATH AND CORONARY ANGIOGRAPHY N/A 01/25/2018   Procedure: RIGHT/LEFT HEART CATH AND CORONARY ANGIOGRAPHY;  Surgeon: [name], MD;  Location: [place] [facility];  Service: Cardiovascular;   Laterality: N/A;  . SAVORY DILATION N/A 10/12/2017   Procedure: SAVORY DILATION;  Surgeon: [name], MD;  [facility];  Service: Gastroenterology;  Laterality: N/A;  . SPACE OAR INSTILLATION N/A 04/29/2017   Procedure: SPACE OAR INSTILLATION;  Surgeon: [name], MD;  Location: [facility];  Service: Urology;  Laterality: N/A;  . TRANSTHORACIC ECHOCARDIOGRAM  05-05-2016   dr berry   ef 18-84%,  grade 1diastolic dysfunction/  moderate AV stenosis with mod. AR (right coronary and noncoronary cusp mobility seversly restricted);  valve area 1.25cm^2, mean gradient 27 mmHg, peak gradient 54mmHg/  mild MR  . UPPER GASTROINTESTINAL ENDOSCOPY      MEDICATIONS: . albuterol (PROVENTIL HFA;VENTOLIN HFA) 108 (90 Base) MCG/ACT inhaler  . alfuzosin (UROXATRAL) 10 MG 24 hr tablet  . aspirin  EC 81 MG tablet  . Cholecalciferol (VITAMIN D-3) 5000 units TABS  . feeding supplement, ENSURE ENLIVE, (ENSURE ENLIVE) LIQD  . fluticasone (FLONASE) 50 MCG/ACT nasal spray  . levothyroxine (SYNTHROID, LEVOTHROID) 50 MCG tablet   No current facility-administered medications for this encounter.    - Pt instructed 03/11/18 to stop plavix by Dr. Antionette Char office.    If no changes, I anticipate pt can proceed with surgery as scheduled.   Willeen Cass, FNP-BC [facility] Stay Surgical [facility]/Anesthesiology Phone: [phone] 03/12/2018 9:45 AM

## 2018-03-15 MED ORDER — VANCOMYCIN HCL 10 G IV SOLR
1250.0000 mg | INTRAVENOUS | Status: DC
  Filled 2018-03-15: qty 1250

## 2018-03-15 MED ORDER — DOPAMINE-DEXTROSE 3.2-5 MG/ML-% IV SOLN
0.0000 ug/kg/min | INTRAVENOUS | Status: DC
  Filled 2018-03-15: qty 250

## 2018-03-15 MED ORDER — EPINEPHRINE PF 1 MG/ML IJ SOLN
0.0000 ug/min | INTRAVENOUS | Status: DC
  Filled 2018-03-15: qty 4

## 2018-03-15 MED ORDER — MAGNESIUM SULFATE 50 % IJ SOLN
40.0000 meq | INTRAMUSCULAR | Status: DC
  Filled 2018-03-15: qty 9.85

## 2018-03-15 MED ORDER — DEXMEDETOMIDINE HCL IN NACL 400 MCG/100ML IV SOLN
0.1000 ug/kg/h | INTRAVENOUS | Status: DC
  Filled 2018-03-15: qty 100

## 2018-03-15 MED ORDER — POTASSIUM CHLORIDE 2 MEQ/ML IV SOLN
80.0000 meq | INTRAVENOUS | Status: DC
  Filled 2018-03-15: qty 40

## 2018-03-15 MED ORDER — SODIUM CHLORIDE 0.9 % IV SOLN
30.0000 ug/min | INTRAVENOUS | Status: DC
  Filled 2018-03-15: qty 2

## 2018-03-15 MED ORDER — NOREPINEPHRINE 4 MG/250ML-% IV SOLN
0.0000 ug/min | INTRAVENOUS | Status: DC
  Filled 2018-03-15: qty 250

## 2018-03-15 MED ORDER — SODIUM CHLORIDE 0.9 % IV SOLN
INTRAVENOUS | Status: DC
  Filled 2018-03-15: qty 1

## 2018-03-15 MED ORDER — SODIUM CHLORIDE 0.9 % IV SOLN
INTRAVENOUS | Status: DC
  Filled 2018-03-15: qty 30

## 2018-03-15 MED ORDER — LEVOFLOXACIN IN D5W 500 MG/100ML IV SOLN
500.0000 mg | INTRAVENOUS | Status: DC
  Filled 2018-03-15: qty 100

## 2018-03-15 MED ORDER — NITROGLYCERIN IN D5W 200-5 MCG/ML-% IV SOLN
2.0000 ug/min | INTRAVENOUS | Status: DC
  Filled 2018-03-15: qty 250

## 2018-03-15 NOTE — Anesthesia Preprocedure Evaluation (Addendum)
Anesthesia Evaluation  Patient identified by MRN, date of birth, ID band Patient awake    Reviewed: Allergy & Precautions, NPO status , Patient's Chart, lab work & pertinent test results  History of Anesthesia Complications Negative for: history of anesthetic complications  Airway Mallampati: II  TM Distance: >3 FB Neck ROM: Full    Dental  (+) Edentulous Upper, Edentulous Lower   Pulmonary sleep apnea (intolerant of CPAP) , COPD,  COPD inhaler, former smoker (quit 2007),    breath sounds clear to auscultation       Cardiovascular hypertension (off meds now), + Peripheral Vascular Disease (ilial stents, AAA 3cm)  (-) CAD + Valvular Problems/Murmurs (AVA 0.42 cm2, peak gradient 120 mmHg, mean grad 59 mmHg) AS and AI  Rhythm:Regular Rate:Normal + Systolic murmurs 7/74 ECHO: normal LVF, EF 60-65%, severe AS with moderate to severe AI,  Mean gradient (S): 59 mm Hg. Peak gradient (S): 120 mm Hg.   Neuro/Psych Anxiety negative neurological ROS     GI/Hepatic Neg liver ROS, GERD  Poorly Controlled,  Endo/Other  Hypothyroidism   Renal/GU negative Renal ROS   Prostate cancer    Musculoskeletal  (+) Arthritis ,   Abdominal   Peds  Hematology Hb 11.6, plt 105k   Anesthesia Other Findings   Reproductive/Obstetrics                            Anesthesia Physical Anesthesia Plan  ASA: IV  Anesthesia Plan: General   Post-op Pain Management:    Induction: Intravenous  PONV Risk Score and Plan: 2 and Ondansetron, Treatment may vary due to age or medical condition and Dexamethasone  Airway Management Planned: Oral ETT  Additional Equipment: Arterial line, CVP and Ultrasound Guidance Line Placement  Intra-op Plan:   Post-operative Plan: Possible Post-op intubation/ventilation  Informed Consent: I have reviewed the patients History and Physical, chart, labs and discussed the procedure including  the risks, benefits and alternatives for the proposed anesthesia with the patient or authorized representative who has indicated his/her understanding and acceptance.   Dental advisory given  Plan Discussed with: CRNA and Surgeon  Anesthesia Plan Comments: (Plan routine monitors, A line, Central line, GETA with transthoracic Echo, probable post op ventilation)       Anesthesia Quick Evaluation

## 2018-03-15 NOTE — H&P (Signed)
[address]       [place] [address]             [phone]      Cardiothoracic Surgery Admission History and Physical   Referring Provider is [name], MD  PCP is [name], MD      Chief Complaint  Patient presents with  . Aortic Stenosis       HPI:  The patient is a 77 year old gentleman with a history of hypertension, hyperlipidemia, hypothyroidism, obstructive sleep apnea intolerant to CPAP due to claustrophobia, previous heavy smoking with COPD, prostate cancer status diagnosed in 2018 status post XRT, aortoiliac vascular disease status post bilateral common iliac artery stenting, and aortic stenosis. Over the past year or so the patient has had significant swallowing difficulty and was diagnosed with achalasia. He has lost about 50 pounds over the past year. He was seen by Dr. Johney Maine and was felt that he would require surgical treatment but required cardiac clearance. He was evaluated by Dr. [name] and had an echocardiogram on 01/01/2018 which showed a severely calcified aortic valve with a mean gradient of 59 mmHg and a peak gradient of 120 mmHg. Dimensionless index was 0.23. Left ventricular ejection fraction was 60 to 65% with grade 1 diastolic dysfunction. He underwent cardiac catheterization on 01/25/2018 which showed no significant coronary disease. The aortic valve gradient was 50 mmHg. Aortic valve area was 0.55 cm. LVEDP was 23. PA pressure was 37/14 with a mean wedge pressure of 13. Bilateral iliac artery stents were patent.  The patient is here today with his wife and daughter. He says that he has not been very active because he gets tired easily and has no energy. He has shortness of breath with any significant exertion and occasionally has shortness of breath at night. He has frequent episodes of dizziness when he stands up but is never had any syncope. He denies peripheral edema. He occasionally has some brief chest pain but only last  for a few seconds.      Past Medical History:  Diagnosis Date  . AAA (abdominal aortic aneurysm) ([place])    found by berry at periphral vascular catherization w/ stenting 05-29-2016--- duplex 06-09-2016 distal AAA measures 3.0cm x 3.1cm  . Arthritis    "maybe in my hands" (01/25/2018)  . Carotid stenosis, asymptomatic, bilateral    per duplex --- bilateral <50% ICA  . Chronic bronchitis ([place])   . COPD with emphysema [facility])    pulmologist- dr [name]  . First degree heart block   . GERD (gastroesophageal reflux disease)   . HOH (hard of hearing)   . Hyperlipidemia    no meds normal presently  . Hypertension    pt stopped taking losartan apprx. April 2018 , per pt pcp instructed him to stop  . Hypothyroidism   . IBS (irritable bowel syndrome)    intermittant constipation  . Lesion of left lung    followed by dr [name] (pulmologist) post infection scarring  . Mild dementia   . OSA (obstructive sleep apnea)    has a cpap- intolerant; due to Claustaphobia (01/25/2018)  . Peripheral vascular disease [facility])    cardiologist-- dr berry-- per last ABI/Aorta/Iliac 06-09-2016 patent bilateral iliac kissing stents, >50% stenosis bilateral iliac artery (R>L) and left external iliac artery, patent IVC  . Prostate cancer [facility]) urologist- dr [name] oncologist- dr [name]   dx 03/ 2018-- Stage T1c, Gleason 4+5, PSA 8.15, vol 36cc--- currently  ADT and planned external radiation  . RLS (restless legs syndrome)   . Schatzki's ring   . Wears partial dentures    upper        Past Surgical History:  Procedure Laterality Date  . ABDOMINAL AORTOGRAM N/A 01/25/2018   Procedure: ABDOMINAL AORTOGRAM; Surgeon: [name], MD; Location: [place] [facility]; Service: Cardiovascular; Laterality: N/A;  . ANAL FISTULECTOMY  2010  . CARDIAC CATHETERIZATION Bilateral 01/25/2018  . CARDIOVASCULAR STRESS TEST  04-23-2016 dr berry   normal nuclear study w/ no ischemia/ normal LV function and wall function, nuclear  stress ef 57%  . CATARACT EXTRACTION W/ INTRAOCULAR LENS IMPLANT, BILATERAL Bilateral   . COLONOSCOPY    . ESOPHAGEAL MANOMETRY N/A 10/12/2017   Procedure: ESOPHAGEAL MANOMETRY (EM); Surgeon: [name], MD; Location: [facility]; Service: Gastroenterology; Laterality: N/A;  . ESOPHAGOGASTRODUODENOSCOPY N/A 10/12/2017   Procedure: ESOPHAGOGASTRODUODENOSCOPY (EGD); Surgeon: [name], MD; Location: [facility]; Service: Gastroenterology; Laterality: N/A;  . ESOPHAGOGASTRODUODENOSCOPY (EGD) WITH ESOPHAGEAL DILATION  last one 11-09-2015 dr Yolanda Bonine   dilation schatzki's ring  . GOLD SEED IMPLANT N/A 04/29/2017   Procedure: GOLD SEED IMPLANT; Surgeon: [name], MD; Location: [facility]; Service: Urology; Laterality: N/A;  . HERNIA REPAIR    . INGUINAL HERNIA REPAIR Right 02/23/2014   Procedure: HERNIA REPAIR INGUINAL ADULT; Surgeon: Joyice Faster. Cornett, MD; Location: [place]; Service: General; Laterality: Right;  . INSERTION OF MESH Right 02/23/2014   Procedure: INSERTION OF MESH; Surgeon: Joyice Faster. Cornett, MD; Location: [place]; Service: General; Laterality: Right;  . PERIPHERAL VASCULAR CATHETERIZATION N/A 05/29/2016   Procedure: Lower Extremity Angiography; Surgeon: [name], MD; Location: [place] [facility]; Service: Cardiovascular; Laterality: N/A; Diamondback orbital rotational atherectomy, PTA and stenting bilateral common iliac arteries   . PILONIDAL CYST / SINUS EXCISION  2000  . PROSTATE BIOPSY  09-22-2016 dr [name] office  . RIGHT/LEFT HEART CATH AND CORONARY ANGIOGRAPHY N/A 01/25/2018   Procedure: RIGHT/LEFT HEART CATH AND CORONARY ANGIOGRAPHY; Surgeon: [name], MD; Location: [place] [facility]; Service: Cardiovascular; Laterality: N/A;  . SAVORY DILATION N/A 10/12/2017   Procedure: SAVORY DILATION; Surgeon: [name], MD; Location: [facility]; Service: Gastroenterology; Laterality:  N/A;  . SPACE OAR INSTILLATION N/A 04/29/2017   Procedure: SPACE OAR INSTILLATION; Surgeon: [name], MD; Location: [facility]; Service: Urology; Laterality: N/A;  . TRANSTHORACIC ECHOCARDIOGRAM  05-05-2016 dr berry   ef 16-10%, grade 1diastolic dysfunction/ moderate AV stenosis with mod. AR (right coronary and noncoronary cusp mobility seversly restricted); valve area 1.25cm^2, mean gradient 27 mmHg, peak gradient 88mmHg/ mild MR  . UPPER GASTROINTESTINAL ENDOSCOPY          Family History  Problem Relation Age of Onset  . Heart attack Mother   . Stroke Father   . Asthma Father   . Cancer Neg Hx   . Colon cancer Neg Hx   . Stomach cancer Neg Hx   . Esophageal cancer Neg Hx   . Rectal cancer Neg Hx   . Pancreatic cancer Neg Hx   . Prostate cancer Neg Hx    Social History        Socioeconomic History  . Marital status: Married    Spouse name: Not on file  . Number of children: Not on file  . Years of education: Not on file  . Highest education level: Not on file  Occupational History  . Not on file  Social Needs  .  Financial resource strain: Not on file  . Food insecurity:    Worry: Not on file    Inability: Not on file  . Transportation needs:    Medical: Not on file    Non-medical: Not on file  Tobacco Use  . Smoking status: Former Smoker    Packs/day: 1.00    Years: 50.00    Pack years: 50.00    Types: Cigarettes    Last attempt to quit: 07/05/2005    Years since quitting: 12.6  . Smokeless tobacco: Never Used  Substance and Sexual Activity  . Alcohol use: No  . Drug use: Never  . Sexual activity: Not Currently  Lifestyle  . Physical activity:    Days per week: Not on file    Minutes per session: Not on file  . Stress: Not on file  Relationships  . Social connections:    Talks on phone: Not on file    Gets together: Not on file    Attends religious service: Not on file    Active member of club or organization: Not on file     Attends meetings of clubs or organizations: Not on file    Relationship status: Not on file  . Intimate partner violence:    Fear of current or ex partner: Not on file    Emotionally abused: Not on file    Physically abused: Not on file    Forced sexual activity: Not on file  Other Topics Concern  . Not on file  Social History Narrative  . Not on file         Current Outpatient Medications  Medication Sig Dispense Refill  . albuterol (PROVENTIL HFA;VENTOLIN HFA) 108 (90 Base) MCG/ACT inhaler Inhale 1 puff into the lungs every 6 (six) hours as needed for wheezing or shortness of breath.    . alfuzosin (UROXATRAL) 10 MG 24 hr tablet Take 1 tablet (10 mg total) by mouth daily with breakfast. 30 tablet 0  . aspirin EC 81 MG tablet Take 81 mg by mouth daily.    . Cholecalciferol (VITAMIN D3) 1000 units CAPS Take 7,000 Units by mouth daily.     . clopidogrel (PLAVIX) 75 MG tablet Take 75 mg daily by mouth.    . feeding supplement, ENSURE ENLIVE, (ENSURE ENLIVE) LIQD Take 237 mLs by mouth 2 (two) times daily between meals. 237 mL 12  . fluticasone (FLONASE) 50 MCG/ACT nasal spray Place 2 sprays into both nostrils daily. 16 g 3  . HYDROcodone-acetaminophen (NORCO/VICODIN) 5-325 MG tablet Take 1 tablet by mouth every 4 (four) hours as needed for moderate pain.  0  . levothyroxine (SYNTHROID, LEVOTHROID) 50 MCG tablet TAKE 1 TABLET BY MOUTH ONCE DAILY 90 tablet 1   No current facility-administered medications for this visit.         Allergies  Allergen Reactions  . Penicillins Anaphylaxis, Itching, Swelling and Other (See Comments)    Has patient had a PCN reaction causing immediate rash, facial/tongue/throat swelling, SOB or lightheadedness with hypotension: Yes  Has patient had a PCN reaction causing severe rash involving mucus membranes or skin necrosis: No  Has patient had a PCN reaction that required hospitalization: Yes  Has patient had a PCN reaction occurring within the last 10 years:  No  If all of the above answers are "NO", then may proceed with Cephalosporin use.   . Sulfa Antibiotics Anaphylaxis and Swelling  . Versed [Midazolam] Other (See Comments)    Confusion, delerium   Review  of Systems:  General: decreased appetite, decreased energy, no weight gain, + weight loss, no fever  Cardiac: + chest pain with exertion, no chest pain at rest, + SOB with exertion, + resting SOB, no PND, + orthopnea, no palpitations, no arrhythmia, no atrial fibrillation, no LE edema, + dizzy spells, no syncope  Respiratory: + shortness of breath, no home oxygen, no productive cough, no dry cough, no bronchitis, no wheezing, no hemoptysis, no asthma, no pain with inspiration or cough, + sleep apnea, does not use CPAP at night  GI: + difficulty swallowing, + reflux, no frequent heartburn, + hiatal hernia, no abdominal pain, + constipation, no diarrhea, no hematochezia, no hematemesis, no melena  GU: no dysuria, + frequency, no urinary tract infection, no hematuria, no enlarged prostate, no kidney stones, no kidney disease  Vascular: + pain suggestive of claudication, no pain in feet, no leg cramps, no varicose veins, no DVT, no non-healing foot ulcer  Neuro: no stroke, no TIA's, no seizures, no headaches, no temporary blindness one eye, + slurred speech, no peripheral neuropathy, no chronic pain, + instability of gait, + memory/cognitive dysfunction  Musculoskeletal: no arthritis, no joint swelling, no myalgias, some difficulty walking, reduced mobility  Skin: no rash, no itching, no skin infections, no pressure sores or ulcerations  Psych: no anxiety, no depression, no nervousness, no unusual recent stress  Eyes: + blurry vision, + floaters, + recent vision changes, wears glasses  ENT: + hearing loss, edentulous, no dentures,  Hematologic: no easy bruising, no abnormal bleeding, no clotting disorder, no frequent epistaxis  Endocrine: no diabetes, does not check CBG's at home  Physical Exam:    BP (!) 130/57  Pulse 84  Resp 18  Ht 5\' 5"  (1.651 m)  Wt 137 lb (62.1 kg)  SpO2 96% Comment: RA  BMI 22.80 kg/m  General: Elderly, frail-appearing  HEENT: Unremarkable, NCAT, PERLA, EOMI, oropharynx clear, edentulous  Neck: no JVD, no bruits, transmitted murmur to both sides of neck, no adenopathy or thyromegaly  Chest: clear to auscultation, symmetrical breath sounds, no wheezes, no rhonchi  CV: RRR, grade III/VI crescendo/decrescendo murmur heard best at RSB, II/VI diastolic murmur LLSB  Abdomen: soft, non-tender, no masses or organomegaly  Extremities: warm, well-perfused, pulses not palpable in feet, no LE edema  Rectal/GU Deferred  Neuro: Grossly non-focal and symmetrical throughout  Skin: Clean and dry, no rashes, no breakdown  Diagnostic Tests:  [facility]  [address], [place] [address]  [phone]  -------------------------------------------------------------------  Transthoracic Echocardiography  Patient: Robert Schaefer, Robert Schaefer  MR #: [id number]  Study Date: 01/01/2018  Gender: M  Age: 3  Height: 165.1 cm  Weight: 61.2 kg  BSA: 1.68 m^2  Pt. Status:  Room:  ATTENDING [name], M.D.  ORDERING Barrett, Rhonda G  REFERRING Barrett, Evelene Croon  SONOGRAPHER Wyatt Mage, RDCS  PERFORMING [facility], Outpatient  cc:  -------------------------------------------------------------------  LV EF: 60% - 65%  -------------------------------------------------------------------  Indications: Aortic stenosis (I35).  -------------------------------------------------------------------  History: PMH: Syncope. Aortic valve disease. Chronic  obstructive pulmonary disease. Risk factors: PVD. OSA. Former  tobacco use. Hypertension. Dyslipidemia.  -------------------------------------------------------------------  Study Conclusions  - Left ventricle: The cavity size was mildly dilated. Wall  thickness was normal. Systolic function was normal. The estimated   ejection fraction was in the range of 60% to 65%. Doppler  parameters are consistent with abnormal left ventricular  relaxation (grade 1 diastolic dysfunction).  - Aortic valve: AV is severely calcified with motion of the  left  cusp only. Gradients have increased significantly since last echo  mean 52 mmHg->64 mmHg and peak 85 mmHg -> 123 mmHG. There was  severe stenosis. There was moderate to severe regurgitation. Mean  gradient (S): 59 mm Hg. Peak gradient (S): 120 mm Hg.  - Left atrium: The atrium was mildly dilated.  - Atrial septum: No defect or patent foramen ovale was identified.  -------------------------------------------------------------------  Labs, prior tests, procedures, and surgery:  Transthoracic echocardiography (04/28/2017). The aortic valve  showed moderate to severe stenosis. EF was 60%. Aortic valve: peak  gradient of 85 mm Hg and mean gradient of 52 mm Hg.  -------------------------------------------------------------------  Study data: Comparison was made to the study of 04/28/2017. Study  status: Routine. Procedure: The patient reported no pain pre or  post test. Transthoracic echocardiography. Image quality was  adequate. Study completion: There were no complications.  Transthoracic echocardiography. M-mode, complete 2D, spectral  Doppler, and color Doppler. Birthdate: Patient birthdate:  09/14/1940. Age: Patient is 77 yr old. Sex: Gender: male.  BMI: 22.5 kg/m^2. Blood pressure: 134/62 Patient status:  Outpatient. Study date: Study date: 01/01/2018. Study time: 03:37  PM. Location: [place] [facility]  -------------------------------------------------------------------  -------------------------------------------------------------------  Left ventricle: The cavity size was mildly dilated. Wall thickness  was normal. Systolic function was normal. The estimated ejection  fraction was in the range of 60% to 65%. Doppler parameters are  consistent with abnormal  left ventricular relaxation (grade 1  diastolic dysfunction).  -------------------------------------------------------------------  Aortic valve: AV is severely calcified with motion of the left  cusp only. Gradients have increased significantly since last echo  mean 52 mmHg->64 mmHg and peak 85 mmHg -> 123 mmHG. Trileaflet;  severely thickened, severely calcified leaflets. Doppler: There  was severe stenosis. There was moderate to severe regurgitation.  VTI ratio of LVOT to aortic valve: 0.24. Valve area (VTI): 0.77  cm^2. Indexed valve area (VTI): 0.46 cm^2/m^2. Peak velocity ratio  of LVOT to aortic valve: 0.23. Valve area (Vmax): 0.71 cm^2.  Indexed valve area (Vmax): 0.42 cm^2/m^2. Mean velocity ratio of  LVOT to aortic valve: 0.24. Valve area (Vmean): 0.77 cm^2. Indexed  valve area (Vmean): 0.46 cm^2/m^2. Mean gradient (S): 59 mm Hg.  Peak gradient (S): 120 mm Hg.  -------------------------------------------------------------------  Aorta: The aorta was normal, not dilated, and non-diseased.  -------------------------------------------------------------------  Mitral valve: Mildly thickened leaflets . Doppler: There was  trivial regurgitation. Valve area by pressure half-time: 2.53  cm^2. Indexed valve area by pressure half-time: 1.51 cm^2/m^2.  -------------------------------------------------------------------  Left atrium: The atrium was mildly dilated.  -------------------------------------------------------------------  Atrial septum: No defect or patent foramen ovale was identified.  -------------------------------------------------------------------  Right ventricle: The cavity size was normal. Wall thickness was  normal. Systolic function was normal.  -------------------------------------------------------------------  Pulmonic valve: Doppler: There was mild regurgitation.  -------------------------------------------------------------------  Tricuspid valve: Doppler:  There was mild regurgitation.  -------------------------------------------------------------------  Right atrium: The atrium was normal in size.  -------------------------------------------------------------------  Pericardium: The pericardium was normal in appearance.  -------------------------------------------------------------------  Post procedure conclusions  Ascending Aorta:  - The aorta was normal, not dilated, and non-diseased.  -------------------------------------------------------------------  Measurements  Left ventricle Value Reference  LV ID, ED, PLAX chordal 44 mm 43 - 52  LV ID, ES, PLAX chordal 32 mm 23 - 38  LV fx shortening, PLAX chordal (L) 27 % >=29  LV PW thickness, ED 11 mm ---------  IVS/LV PW ratio, ED 0.91 <=1.3  Stroke volume, 2D 96 ml ---------  Stroke volume/bsa, 2D 57  ml/m^2 ---------  LV e&', lateral 7.97 cm/s ---------  LV E/e&', lateral 4.93 ---------  LV e&', medial 5.98 cm/s ---------  LV E/e&', medial 6.57 ---------  LV e&', average 6.98 cm/s ---------  LV E/e&', average 5.63 ---------  Ventricular septum Value Reference  IVS thickness, ED 10 mm ---------  LVOT Value Reference  LVOT ID, S 20 mm ---------  LVOT area 3.14 cm^2 ---------  LVOT peak velocity, S 124 cm/s ---------  LVOT mean velocity, S 87.5 cm/s ---------  LVOT VTI, S 30.7 cm ---------  LVOT peak gradient, S 6 mm Hg ---------  Aortic valve Value Reference  Aortic valve peak velocity, S 547 cm/s ---------  Aortic valve mean velocity, S 358 cm/s ---------  Aortic valve VTI, S 126 cm ---------  Aortic mean gradient, S 59 mm Hg ---------  Aortic peak gradient, S 120 mm Hg ---------  VTI ratio, LVOT/AV 0.24 ---------  Aortic valve area, VTI 0.77 cm^2 ---------  Aortic valve area/bsa, VTI 0.46 cm^2/m^2 ---------  Velocity ratio, peak, LVOT/AV 0.23 ---------  Aortic valve area, peak velocity 0.71 cm^2 ---------  Aortic valve area/bsa, peak 0.42 cm^2/m^2 ---------  velocity    Velocity ratio, mean, LVOT/AV 0.24 ---------  Aortic valve area, mean velocity 0.77 cm^2 ---------  Aortic valve area/bsa, mean 0.46 cm^2/m^2 ---------  velocity  Aortic regurg pressure half-time 323 ms ---------  Aorta Value Reference  Aortic root ID, ED 34 mm ---------  Left atrium Value Reference  LA ID, A-P, ES 38 mm ---------  LA ID/bsa, A-P (H) 2.26 cm/m^2 <=2.2  LA volume, S 39.2 ml ---------  LA volume/bsa, S 23.3 ml/m^2 ---------  LA volume, ES, 1-p A4C 32.3 ml ---------  LA volume/bsa, ES, 1-p A4C 19.2 ml/m^2 ---------  LA volume, ES, 1-p A2C 47.8 ml ---------  LA volume/bsa, ES, 1-p A2C 28.5 ml/m^2 ---------  Mitral valve Value Reference  Mitral E-wave peak velocity 39.3 cm/s ---------  Mitral A-wave peak velocity 92.6 cm/s ---------  Mitral deceleration time (H) 296 ms 150 - 230  Mitral pressure half-time 87 ms ---------  Mitral E/A ratio, peak 0.4 ---------  Mitral valve area, PHT, DP 2.53 cm^2 ---------  Mitral valve area/bsa, PHT, DP 1.51 cm^2/m^2 ---------  Systemic veins Value Reference  Estimated CVP 3 mm Hg ---------  Right ventricle Value Reference  TAPSE 19.8 mm ---------  RV s&', lateral, S 10.6 cm/s ---------  Pulmonic valve Value Reference  Pulmonic regurg velocity, ED 150 cm/s ---------  Pulmonic regurg gradient, ED 9 mm Hg ---------  Legend:  (L) and (H) mark values outside specified reference range.  -------------------------------------------------------------------  Prepared and Electronically Authenticated by  [name], M.D.  2019-07-05T17:17:57  Robert Schaefer is a 77 y.o. male  [id number]  LOCATION: FACILITY: [facility]  PHYSICIAN: Quay Burow, M.D.  [birth year]  DATE OF PROCEDURE: 01/25/2018  DATE OF DISCHARGE:   CARDIAC CATHETERIZATION  History obtained from chart review.Robert Schaefer is a 77 y.o. married Caucasian male father of 2 children, grandfather 5 grandchildren who is covering by his wife [name] He was referred by Dr. Melford Aase for  cardiovascular evaluation because of chest pain and claudication. I last saw him in the office 09/16/2016. His cardiovascular risk factor profile is notable for 100 pack years of tobacco abuse having quit 10 years ago. He has treated hypertension and hyperlipidemia. There is a family history of heart disease with a mother who died of a myocardial infarction age 39. He has never had a heart attack or stroke. He  has had recent chest pain with a CT angiogram that ruled out pulmonary embolus but did show diffuse coronary calcification. An abdominal pelvic CT performed because of unexplained weight loss showed aortoiliac calcification size/atherosclerosis as well as atherosclerosis of the superior mesenteric artery and left renal artery. He complains of right greater than left leg claudication of the last year occurring after walking 4-5 minutes. Since I saw him in the office he's had a Myoview stress test which was nonischemic problem, a 2-D echo that showed normal LV function with moderate aortic stenosis, carotid Dopplers that showed no evidence of ICA stenosis and lower extremity Dopplers reveal a right ABI of 0.48 with an occluded right common iliac and a left ABI of 1.23 with moderate left common iliac artery stenosis. He also complains a 30 pound weight loss of and had SMA calcification on abdominal CT. He may have mesenteric ischemia as well. He underwent peripheral angiography and intervention by myself 05/29/16 demonstrating a small abdominal aortic aneurysm, 95% calcified ostial superior mesenteric artery stenosis, right common iliac chronic total occlusion was calcified and high-grade common iliac artery stenosis. He underwent diamondback orbital rotational atherectomy, PTA and covered stenting using Lifestream covered stents" of both common iliac arteries using "kissing stent technique. His claudication symptoms have resolved. The ABIs are normal. She does have a high-frequency signal in his right common  iliac artery demonstrated a duplex ultrasound 06/09/16 was asymptomatic from this.  Since I saw him last year he is done well. He does now have achalasia and is unable to eat and is losing weight. He has had syncope in the past as well as generalized fatigue. Recent 2D echo performed 01/01/2018 showed critical aortic stenosis with a valve area 0.42 cm and a peak aortic gradient of 120 mmHg. He presents today for elective outpatient right and left heart cath to define his anatomy and physiology.  IMPRESSION: Robert Schaefer has normal coronary arteries and critical aortic stenosis. His iliac stents are widely patent. Given his age and elevated blood pressure I think it is prudent to keep him overnight for observation. He will be gently hydrated and discharged home in the morning. I will refer him to  TCT S for surgical evaluation for aortic valve replacement. The sheath were removed and pressure held in the groin to achieve hemostasis. The patient left the lab in stable condition.  Quay Burow. MD, [facility]  01/25/2018  3:41 PM   Indications  Critical aortic valve stenosis [I35.0 (ICD-10-CM)]  Procedural Details/Technique  Technical Details PROCEDURE DESCRIPTION:   The patient was brought to the second floor [facility] lab in the postabsorptive state. He was not premedicated . His groin was prepped and shaved in usual sterile fashion. Xylocaine 1% was used for local anesthesia. A 6 French sheath was inserted into the common femoral artery using standard Seldinger technique. A 7 French sheath was inserted into the right common femoral vein. A 7 [place] balloontipped thermal dilution Swan-Ganz catheter was used to obtain sequential right heart pressures and obtain blood samples for the determination of Fick and thermal dilution cardiac outputs. 5 French right and left Judkins diagnostic catheters along with 5 French pigtail catheter were used for selective coronary angiography, crossing the aortic  valve with a straight wire and right Judkins catheter and distal abdominal aortography. Isovue dye was used for the entirety of the case. Retrograde aorta, left ventricular and pullback pressures were recorded.   Estimated blood loss <50 mL.  During this procedure no sedation  was administered.  Coronary Findings  Diagnostic  Dominance: Right  No diagnostic findings have been documented.  Intervention  No interventions have been documented.  Right Heart  Right Heart Pressures Hemodynamic findings consistent with aortic valve stenosis. Right atrial pressure- 6/4 Right ventricular pressure-34/1 Pulmonary artery pressure- 37/14, mean 22 Pulmonary wedge pressure- A-wave 18, V wave 17, mean 13 LVEDP- 23 Cardiac output by Fick- 5.3 L/min, thermodilution-8 L/min Aortic valve gradient- 50 mmHg Aortic valve area (Fick) 0.55 cm  Coronary Diagrams  Diagnostic Diagram     Implants     No implant documentation for this case.  MERGE Images  Link to Procedure Log   Show images for CARDIAC CATHETERIZATION Procedure Log  Hemo Data   Most Recent Value  Fick Cardiac Output 5.31 L/min  Fick Cardiac Output Index 3.13 (L/min)/BSA  Thermal Cardiac Output 8 L/min  Thermal Cardiac Output Index 4.72 (L/min)/BSA  Aortic Mean Gradient 50.7 mmHg  Aortic Peak Gradient 48 mmHg  Aortic Valve Area 0.55  Aortic Value Area Index 0.32 cm2/BSA  RA A Wave 6 mmHg  RA V Wave 4 mmHg  RA Mean 3 mmHg  RV Systolic Pressure 34 mmHg  RV Diastolic Pressure 1 mmHg  RV EDP 7 mmHg  PA Systolic Pressure 37 mmHg  PA Diastolic Pressure 14 mmHg  PA Mean 22 mmHg  PW A Wave 18 mmHg  PW V Wave 17 mmHg  PW Mean 13 mmHg  AO Systolic Pressure 539 mmHg  AO Diastolic Pressure 46 mmHg  AO Mean 89 mmHg  LV Systolic Pressure 767 mmHg  LV Diastolic Pressure 0 mmHg  LV EDP 23 mmHg  AOp Systolic Pressure 341 mmHg  AOp Diastolic Pressure 54 mmHg  AOp Mean Pressure 937 mmHg  LVp Systolic Pressure 902 mmHg  LVp Diastolic  Pressure 0 mmHg  LVp EDP Pressure 19 mmHg  QP/QS 1  TPVR Index 7.03 HRUI  TSVR Index 28.4 HRUI  PVR SVR Ratio 0.1  TPVR/TSVR Ratio 0.25  STS Adult Cardiac Surgery Database Version 2.9 RISK SCORES Procedure: Isolated AVR CALCULATE  Risk of Mortality:  6.287%   Renal Failure:  4.314%   Permanent Stroke:  2.065%   Prolonged Ventilation:  16.170%   DSW Infection:  0.138%   Reoperation:  9.557%   Morbidity or Mortality:  24.490%   Short Length of Stay:  17.155%   Long Length of Stay:  14.062%    Impression:   This 77 year old gentleman has stage D, severe, symptomatic aortic stenosis and moderate to severe aortic insufficiency with New York Heart Association class III symptoms of exertional fatigue and shortness of breath consistent with chronic diastolic congestive heart failure. I agree that aortic valve replacement is indicated in this patient for relief of his symptoms and to prevent left ventricular deterioration and worsening congestive heart failure. I think his operative risk for open surgical aortic valve replacement would be at least moderate due to his multiple comorbid risk factors including his age, diffuse vascular disease, COPD, recent prostate cancer with XRT, achalasia with significant dysphasia and weight loss. I have personally reviewed his echocardiogram, cardiac catheterization, and CT scan of the chest done 01/05/2017 to follow-up on a cavitary left upper lobe lung lesion. His echocardiogram shows a trileaflet,severely calcified aortic valve with slight motion of only the left cusp. The mean gradient has increased to 59 mmHg consistent with severe aortic stenosis. There is moderate to severe aortic insufficiency. Left ventricular ejection fraction is normal at 60 to 65% with grade 1  diastolic dysfunction. Cardiac catheterization shows no significant coronary disease and confirmed severe aortic stenosis with a mean gradient of 50 mmHg. CT scan of the chest shows decreased size of  the left upper lobe cavitary lung lesion suggesting that this is most likely postinfectious scarring. He does have some calcification of the ascending aorta. There is also extensive calcification of the aortic arch and branch vessels. His gated cardiac CTA shows anatomy that is suitable for transcatheter aortic valve replacement using a Sapien 3 valve. His abdominal and pelvic CTA shows severe abdominal aortic and iliac vascular disease with significant narrowing within the bilateral iliac stents.  Transfemoral access is not an option.  He has significant calcification at the origin of his left common carotid and left subclavian artery and I think the best access in this gentleman is going to be transapical.  He has a mildly dilated left ventricle with a normal ejection fraction.  I reviewed the transcatheter aortic valve procedure by transapical insertion with the patient and his wife and daughter. We discussed complications that might develop including but not limited to risks of death, stroke, paravalvular leak, aortic dissection or other major vascular complications, aortic annulus rupture, device embolization, cardiac rupture or perforation, mitral regurgitation, acute myocardial infarction, arrhythmia, heart block or bradycardia requiring permanent pacemaker placement, congestive heart failure, respiratory failure, renal failure, pneumonia, infection, other late complications related to structural valve deterioration or migration, or other complications that might ultimately cause a temporary or permanent loss of functional independence or other long term morbidity. The patient provides full informed consent for the procedure as described and all questions were answered.    Gaye Pollack, MD

## 2018-03-16 ENCOUNTER — Inpatient Hospital Stay (HOSPITAL_COMMUNITY)
Admission: RE | Admit: 2018-03-16 | Discharge: 2018-03-22 | DRG: 267 | Disposition: A | Discharge status: Home Health | Payer: Medicare Other | Source: Ambulatory Visit | Attending: Surgery | Admitting: Surgery

## 2018-03-16 ENCOUNTER — Encounter (HOSPITAL_COMMUNITY): Payer: Self-pay

## 2018-03-16 ENCOUNTER — Inpatient Hospital Stay (HOSPITAL_COMMUNITY): Payer: Medicare Other

## 2018-03-16 ENCOUNTER — Inpatient Hospital Stay (HOSPITAL_COMMUNITY): Payer: Medicare Other | Admitting: Certified Registered"

## 2018-03-16 ENCOUNTER — Inpatient Hospital Stay (HOSPITAL_COMMUNITY): Payer: Medicare Other | Admitting: Emergency Medicine

## 2018-03-16 ENCOUNTER — Other Ambulatory Visit: Payer: Self-pay | Admitting: Physician Assistant

## 2018-03-16 ENCOUNTER — Encounter (HOSPITAL_COMMUNITY)
Admission: RE | Disposition: A | Discharge status: Home Health | Payer: Self-pay | Source: Ambulatory Visit | Attending: Surgery

## 2018-03-16 DIAGNOSIS — K222 Esophageal obstruction: Secondary | ICD-10-CM

## 2018-03-16 DIAGNOSIS — R1312 Dysphagia, oropharyngeal phase: Secondary | ICD-10-CM | POA: Diagnosis present

## 2018-03-16 DIAGNOSIS — I10 Essential (primary) hypertension: Secondary | ICD-10-CM | POA: Diagnosis not present

## 2018-03-16 DIAGNOSIS — I352 Nonrheumatic aortic (valve) stenosis with insufficiency: Principal | ICD-10-CM | POA: Diagnosis present

## 2018-03-16 DIAGNOSIS — I110 Hypertensive heart disease with heart failure: Secondary | ICD-10-CM | POA: Diagnosis present

## 2018-03-16 DIAGNOSIS — F05 Delirium due to known physiological condition: Secondary | ICD-10-CM | POA: Diagnosis not present

## 2018-03-16 DIAGNOSIS — Z9119 Patient's noncompliance with other medical treatment and regimen: Secondary | ICD-10-CM

## 2018-03-16 DIAGNOSIS — I5032 Chronic diastolic (congestive) heart failure: Secondary | ICD-10-CM | POA: Diagnosis present

## 2018-03-16 DIAGNOSIS — E785 Hyperlipidemia, unspecified: Secondary | ICD-10-CM | POA: Diagnosis not present

## 2018-03-16 DIAGNOSIS — G4733 Obstructive sleep apnea (adult) (pediatric): Secondary | ICD-10-CM | POA: Diagnosis present

## 2018-03-16 DIAGNOSIS — I97190 Other postprocedural cardiac functional disturbances following cardiac surgery: Secondary | ICD-10-CM | POA: Diagnosis not present

## 2018-03-16 DIAGNOSIS — N401 Enlarged prostate with lower urinary tract symptoms: Secondary | ICD-10-CM | POA: Diagnosis present

## 2018-03-16 DIAGNOSIS — J439 Emphysema, unspecified: Secondary | ICD-10-CM | POA: Diagnosis present

## 2018-03-16 DIAGNOSIS — D649 Anemia, unspecified: Secondary | ICD-10-CM | POA: Diagnosis present

## 2018-03-16 DIAGNOSIS — E039 Hypothyroidism, unspecified: Secondary | ICD-10-CM | POA: Diagnosis present

## 2018-03-16 DIAGNOSIS — Z882 Allergy status to sulfonamides status: Secondary | ICD-10-CM

## 2018-03-16 DIAGNOSIS — Z880 Allergy status to penicillin: Secondary | ICD-10-CM

## 2018-03-16 DIAGNOSIS — F40240 Claustrophobia: Secondary | ICD-10-CM | POA: Diagnosis present

## 2018-03-16 DIAGNOSIS — K220 Achalasia of cardia: Secondary | ICD-10-CM | POA: Diagnosis present

## 2018-03-16 DIAGNOSIS — I6523 Occlusion and stenosis of bilateral carotid arteries: Secondary | ICD-10-CM | POA: Diagnosis present

## 2018-03-16 DIAGNOSIS — C61 Malignant neoplasm of prostate: Secondary | ICD-10-CM | POA: Diagnosis present

## 2018-03-16 DIAGNOSIS — I739 Peripheral vascular disease, unspecified: Secondary | ICD-10-CM | POA: Diagnosis present

## 2018-03-16 DIAGNOSIS — I700 Atherosclerosis of aorta: Secondary | ICD-10-CM | POA: Diagnosis present

## 2018-03-16 DIAGNOSIS — I447 Left bundle-branch block, unspecified: Secondary | ICD-10-CM | POA: Diagnosis not present

## 2018-03-16 DIAGNOSIS — H9190 Unspecified hearing loss, unspecified ear: Secondary | ICD-10-CM | POA: Diagnosis present

## 2018-03-16 DIAGNOSIS — Z79899 Other long term (current) drug therapy: Secondary | ICD-10-CM

## 2018-03-16 DIAGNOSIS — E782 Mixed hyperlipidemia: Secondary | ICD-10-CM | POA: Diagnosis present

## 2018-03-16 DIAGNOSIS — Z952 Presence of prosthetic heart valve: Secondary | ICD-10-CM | POA: Diagnosis not present

## 2018-03-16 DIAGNOSIS — M19041 Primary osteoarthritis, right hand: Secondary | ICD-10-CM | POA: Diagnosis present

## 2018-03-16 DIAGNOSIS — M545 Low back pain: Secondary | ICD-10-CM | POA: Diagnosis not present

## 2018-03-16 DIAGNOSIS — J9811 Atelectasis: Secondary | ICD-10-CM | POA: Diagnosis not present

## 2018-03-16 DIAGNOSIS — I440 Atrioventricular block, first degree: Secondary | ICD-10-CM | POA: Diagnosis present

## 2018-03-16 DIAGNOSIS — I493 Ventricular premature depolarization: Secondary | ICD-10-CM | POA: Diagnosis not present

## 2018-03-16 DIAGNOSIS — Z8679 Personal history of other diseases of the circulatory system: Secondary | ICD-10-CM

## 2018-03-16 DIAGNOSIS — R402363 Coma scale, best motor response, obeys commands, at hospital admission: Secondary | ICD-10-CM | POA: Diagnosis present

## 2018-03-16 DIAGNOSIS — G2581 Restless legs syndrome: Secondary | ICD-10-CM | POA: Diagnosis present

## 2018-03-16 DIAGNOSIS — R634 Abnormal weight loss: Secondary | ICD-10-CM | POA: Diagnosis present

## 2018-03-16 DIAGNOSIS — I480 Paroxysmal atrial fibrillation: Secondary | ICD-10-CM | POA: Diagnosis not present

## 2018-03-16 DIAGNOSIS — Z885 Allergy status to narcotic agent status: Secondary | ICD-10-CM

## 2018-03-16 DIAGNOSIS — I350 Nonrheumatic aortic (valve) stenosis: Secondary | ICD-10-CM | POA: Diagnosis not present

## 2018-03-16 DIAGNOSIS — F0390 Unspecified dementia without behavioral disturbance: Secondary | ICD-10-CM | POA: Diagnosis present

## 2018-03-16 DIAGNOSIS — Z006 Encounter for examination for normal comparison and control in clinical research program: Secondary | ICD-10-CM | POA: Diagnosis not present

## 2018-03-16 DIAGNOSIS — Z923 Personal history of irradiation: Secondary | ICD-10-CM

## 2018-03-16 DIAGNOSIS — Z6822 Body mass index (BMI) 22.0-22.9, adult: Secondary | ICD-10-CM

## 2018-03-16 DIAGNOSIS — Z8546 Personal history of malignant neoplasm of prostate: Secondary | ICD-10-CM

## 2018-03-16 DIAGNOSIS — K219 Gastro-esophageal reflux disease without esophagitis: Secondary | ICD-10-CM | POA: Diagnosis present

## 2018-03-16 DIAGNOSIS — Z87891 Personal history of nicotine dependence: Secondary | ICD-10-CM

## 2018-03-16 DIAGNOSIS — Z954 Presence of other heart-valve replacement: Secondary | ICD-10-CM | POA: Diagnosis not present

## 2018-03-16 DIAGNOSIS — D696 Thrombocytopenia, unspecified: Secondary | ICD-10-CM | POA: Diagnosis present

## 2018-03-16 DIAGNOSIS — Z7902 Long term (current) use of antithrombotics/antiplatelets: Secondary | ICD-10-CM

## 2018-03-16 DIAGNOSIS — N138 Other obstructive and reflux uropathy: Secondary | ICD-10-CM | POA: Diagnosis present

## 2018-03-16 DIAGNOSIS — Z7982 Long term (current) use of aspirin: Secondary | ICD-10-CM

## 2018-03-16 DIAGNOSIS — R402253 Coma scale, best verbal response, oriented, at hospital admission: Secondary | ICD-10-CM | POA: Diagnosis present

## 2018-03-16 DIAGNOSIS — Z955 Presence of coronary angioplasty implant and graft: Secondary | ICD-10-CM

## 2018-03-16 DIAGNOSIS — R402143 Coma scale, eyes open, spontaneous, at hospital admission: Secondary | ICD-10-CM | POA: Diagnosis present

## 2018-03-16 DIAGNOSIS — M19042 Primary osteoarthritis, left hand: Secondary | ICD-10-CM | POA: Diagnosis present

## 2018-03-16 DIAGNOSIS — M25519 Pain in unspecified shoulder: Secondary | ICD-10-CM | POA: Diagnosis not present

## 2018-03-16 DIAGNOSIS — Z9181 History of falling: Secondary | ICD-10-CM

## 2018-03-16 HISTORY — PX: TRANSCATHETER AORTIC VALVE REPLACEMENT, TRANSAPICAL: SHX6401

## 2018-03-16 HISTORY — DX: Presence of prosthetic heart valve: Z95.2

## 2018-03-16 HISTORY — DX: Nonrheumatic aortic (valve) stenosis: I35.0

## 2018-03-16 HISTORY — DX: Achalasia of cardia: K22.0

## 2018-03-16 LAB — POCT I-STAT, CHEM 8
BUN: 15 mg/dL (ref 8–23)
BUN: 14 mg/dL (ref 8–23)
BUN: 14 mg/dL (ref 8–23)
CALCIUM ION: 1.3 mmol/L (ref 1.15–1.40)
CALCIUM ION: 1.32 mmol/L (ref 1.15–1.40)
CHLORIDE: 105 mmol/L (ref 98–111)
CREATININE: 0.7 mg/dL (ref 0.61–1.24)
Calcium, Ion: 1.33 mmol/L (ref 1.15–1.40)
Chloride: 105 mmol/L (ref 98–111)
Chloride: 103 mmol/L (ref 98–111)
Creatinine, Ser: 0.7 mg/dL (ref 0.61–1.24)
Creatinine, Ser: 0.7 mg/dL (ref 0.61–1.24)
GLUCOSE: 114 mg/dL — AB (ref 70–99)
Glucose, Bld: 129 mg/dL — ABNORMAL HIGH (ref 70–99)
Glucose, Bld: 139 mg/dL — ABNORMAL HIGH (ref 70–99)
HCT: 28 % — ABNORMAL LOW (ref 39.0–52.0)
HCT: 25 % — ABNORMAL LOW (ref 39.0–52.0)
HEMATOCRIT: 25 % — AB (ref 39.0–52.0)
HEMOGLOBIN: 9.5 g/dL — AB (ref 13.0–17.0)
HEMOGLOBIN: 8.5 g/dL — AB (ref 13.0–17.0)
Hemoglobin: 8.5 g/dL — ABNORMAL LOW (ref 13.0–17.0)
POTASSIUM: 4.2 mmol/L (ref 3.5–5.1)
Potassium: 4.3 mmol/L (ref 3.5–5.1)
Potassium: 4 mmol/L (ref 3.5–5.1)
SODIUM: 139 mmol/L (ref 135–145)
SODIUM: 138 mmol/L (ref 135–145)
Sodium: 138 mmol/L (ref 135–145)
TCO2: 26 mmol/L (ref 22–32)
TCO2: 25 mmol/L (ref 22–32)
TCO2: 25 mmol/L (ref 22–32)

## 2018-03-16 LAB — POCT I-STAT 4, (NA,K, GLUC, HGB,HCT)
GLUCOSE: 178 mg/dL — AB (ref 70–99)
HCT: 28 % — ABNORMAL LOW (ref 39.0–52.0)
Hemoglobin: 9.5 g/dL — ABNORMAL LOW (ref 13.0–17.0)
POTASSIUM: 3.8 mmol/L (ref 3.5–5.1)
Sodium: 140 mmol/L (ref 135–145)

## 2018-03-16 LAB — POCT I-STAT 3, ART BLOOD GAS (G3+)
Acid-base deficit: 1 mmol/L (ref 0.0–2.0)
BICARBONATE: 25.8 mmol/L (ref 20.0–28.0)
O2 Saturation: 95 %
PCO2 ART: 48.2 mmHg — AB (ref 32.0–48.0)
PH ART: 7.327 — AB (ref 7.350–7.450)
Patient temperature: 35.2
TCO2: 27 mmol/L (ref 22–32)
pO2, Arterial: 73 mmHg — ABNORMAL LOW (ref 83.0–108.0)

## 2018-03-16 SURGERY — REPLACEMENT, AORTIC VALVE, TRANSAPICAL APPROACH
Anesthesia: General | Site: Chest | Laterality: Left

## 2018-03-16 MED ORDER — METOPROLOL TARTRATE 5 MG/5ML IV SOLN: 2.5000 mg | INTRAVENOUS | Status: DC | PRN

## 2018-03-16 MED ORDER — ASPIRIN 81 MG PO CHEW
81.0000 mg | CHEWABLE_TABLET | Freq: Every day | ORAL | Status: DC
  Administered 2018-03-17 – 2018-03-22 (×6): 81 mg via ORAL
  Filled 2018-03-16 (×6): qty 1

## 2018-03-16 MED ORDER — CHLORHEXIDINE GLUCONATE 4 % EX LIQD: 30.0000 mL | CUTANEOUS | Status: DC

## 2018-03-16 MED ORDER — MIDAZOLAM HCL 2 MG/2ML IJ SOLN
INTRAMUSCULAR | Status: AC
  Filled 2018-03-16: qty 2

## 2018-03-16 MED ORDER — SODIUM CHLORIDE 0.9% FLUSH: 10.0000 mL | INTRAVENOUS | Status: DC | PRN

## 2018-03-16 MED ORDER — ACETAMINOPHEN 325 MG PO TABS
650.0000 mg | ORAL_TABLET | Freq: Four times a day (QID) | ORAL | Status: DC | PRN
  Administered 2018-03-17 – 2018-03-18 (×2): 650 mg via ORAL
  Filled 2018-03-16 (×2): qty 2

## 2018-03-16 MED ORDER — POTASSIUM CHLORIDE 10 MEQ/50ML IV SOLN
10.0000 meq | INTRAVENOUS | Status: AC
  Administered 2018-03-16 (×2): 10 meq via INTRAVENOUS
  Filled 2018-03-16 (×2): qty 50

## 2018-03-16 MED ORDER — NITROGLYCERIN IN D5W 200-5 MCG/ML-% IV SOLN
0.0000 ug/min | INTRAVENOUS | Status: DC
  Administered 2018-03-16: 5 ug/min via INTRAVENOUS
  Filled 2018-03-16: qty 250

## 2018-03-16 MED ORDER — CHLORHEXIDINE GLUCONATE 0.12 % MT SOLN
15.0000 mL | Freq: Once | OROMUCOSAL | Status: AC
  Administered 2018-03-16: 15 mL via OROMUCOSAL
  Filled 2018-03-16: qty 15

## 2018-03-16 MED ORDER — CHLORHEXIDINE GLUCONATE CLOTH 2 % EX PADS
6.0000 | MEDICATED_PAD | Freq: Every day | CUTANEOUS | Status: DC
  Administered 2018-03-16: 6 via TOPICAL

## 2018-03-16 MED ORDER — VANCOMYCIN HCL IN DEXTROSE 1-5 GM/200ML-% IV SOLN
1000.0000 mg | Freq: Once | INTRAVENOUS | Status: AC
  Administered 2018-03-16: 1000 mg via INTRAVENOUS
  Filled 2018-03-16: qty 200

## 2018-03-16 MED ORDER — SODIUM CHLORIDE 0.9% FLUSH
10.0000 mL | Freq: Two times a day (BID) | INTRAVENOUS | Status: DC
  Administered 2018-03-16 (×2): 10 mL

## 2018-03-16 MED ORDER — SUGAMMADEX SODIUM 200 MG/2ML IV SOLN
INTRAVENOUS | Status: DC | PRN
  Administered 2018-03-16: 200 mg via INTRAVENOUS

## 2018-03-16 MED ORDER — HEPARIN SODIUM (PORCINE) 1000 UNIT/ML IJ SOLN
INTRAMUSCULAR | Status: AC
  Filled 2018-03-16: qty 1

## 2018-03-16 MED ORDER — DEXAMETHASONE SODIUM PHOSPHATE 10 MG/ML IJ SOLN
INTRAMUSCULAR | Status: AC
  Filled 2018-03-16: qty 1

## 2018-03-16 MED ORDER — SODIUM CHLORIDE 0.9% FLUSH
3.0000 mL | Freq: Two times a day (BID) | INTRAVENOUS | Status: DC
  Administered 2018-03-16 – 2018-03-22 (×10): 3 mL via INTRAVENOUS

## 2018-03-16 MED ORDER — SODIUM CHLORIDE 0.9% FLUSH: 3.0000 mL | INTRAVENOUS | Status: DC | PRN

## 2018-03-16 MED ORDER — ROCURONIUM BROMIDE 10 MG/ML (PF) SYRINGE
PREFILLED_SYRINGE | INTRAVENOUS | Status: DC | PRN
  Administered 2018-03-16 (×2): 10 mg via INTRAVENOUS
  Administered 2018-03-16: 40 mg via INTRAVENOUS

## 2018-03-16 MED ORDER — LEVOTHYROXINE SODIUM 50 MCG PO TABS
50.0000 ug | ORAL_TABLET | Freq: Every day | ORAL | Status: DC
  Administered 2018-03-17 – 2018-03-22 (×6): 50 ug via ORAL
  Filled 2018-03-16 (×6): qty 1

## 2018-03-16 MED ORDER — DEXAMETHASONE SODIUM PHOSPHATE 10 MG/ML IJ SOLN
INTRAMUSCULAR | Status: DC | PRN
  Administered 2018-03-16: 10 mg via INTRAVENOUS

## 2018-03-16 MED ORDER — HYDRALAZINE HCL 20 MG/ML IJ SOLN: 10.0000 mg | INTRAMUSCULAR | Status: DC | PRN

## 2018-03-16 MED ORDER — ONDANSETRON HCL 4 MG/2ML IJ SOLN
4.0000 mg | Freq: Four times a day (QID) | INTRAMUSCULAR | Status: DC | PRN
  Administered 2018-03-18 – 2018-03-21 (×2): 4 mg via INTRAVENOUS
  Filled 2018-03-16 (×2): qty 2

## 2018-03-16 MED ORDER — PROPOFOL 10 MG/ML IV BOLUS
INTRAVENOUS | Status: AC
  Filled 2018-03-16: qty 20

## 2018-03-16 MED ORDER — LACTATED RINGERS IV SOLN
INTRAVENOUS | Status: DC
  Administered 2018-03-16: 12:00:00 via INTRAVENOUS

## 2018-03-16 MED ORDER — SUCCINYLCHOLINE CHLORIDE 200 MG/10ML IV SOSY
PREFILLED_SYRINGE | INTRAVENOUS | Status: AC
  Filled 2018-03-16: qty 10

## 2018-03-16 MED ORDER — LACTATED RINGERS IV SOLN
INTRAVENOUS | Status: DC | PRN
  Administered 2018-03-16 (×2): via INTRAVENOUS

## 2018-03-16 MED ORDER — CHLORHEXIDINE GLUCONATE 4 % EX LIQD: 60.0000 mL | Freq: Once | CUTANEOUS | Status: DC

## 2018-03-16 MED ORDER — TRAMADOL HCL 50 MG PO TABS: 50.0000 mg | ORAL_TABLET | ORAL | Status: DC | PRN

## 2018-03-16 MED ORDER — SUFENTANIL CITRATE 50 MCG/ML IV SOLN
INTRAVENOUS | Status: DC | PRN
  Administered 2018-03-16 (×2): 5 ug via INTRAVENOUS
  Administered 2018-03-16 (×3): 10 ug via INTRAVENOUS

## 2018-03-16 MED ORDER — NOREPINEPHRINE BITARTRATE 1 MG/ML IV SOLN
INTRAVENOUS | Status: DC | PRN
  Administered 2018-03-16: 2 ug/min via INTRAVENOUS

## 2018-03-16 MED ORDER — SODIUM CHLORIDE 0.9 % IV SOLN
INTRAVENOUS | Status: DC | PRN
  Administered 2018-03-16: 500 mL

## 2018-03-16 MED ORDER — ROCURONIUM BROMIDE 50 MG/5ML IV SOSY
PREFILLED_SYRINGE | INTRAVENOUS | Status: AC
  Filled 2018-03-16: qty 10

## 2018-03-16 MED ORDER — FLUTICASONE PROPIONATE 50 MCG/ACT NA SUSP
2.0000 | Freq: Every day | NASAL | Status: DC | PRN
  Filled 2018-03-16: qty 16

## 2018-03-16 MED ORDER — CLOPIDOGREL BISULFATE 75 MG PO TABS
75.0000 mg | ORAL_TABLET | Freq: Every day | ORAL | Status: DC
  Administered 2018-03-17 – 2018-03-21 (×5): 75 mg via ORAL
  Filled 2018-03-16 (×5): qty 1

## 2018-03-16 MED ORDER — IODIXANOL 320 MG/ML IV SOLN
INTRAVENOUS | Status: DC | PRN
  Administered 2018-03-16: 60.9 mL via INTRA_ARTERIAL

## 2018-03-16 MED ORDER — SUFENTANIL CITRATE 50 MCG/ML IV SOLN
INTRAVENOUS | Status: AC
  Filled 2018-03-16: qty 1

## 2018-03-16 MED ORDER — PHENYLEPHRINE HCL-NACL 20-0.9 MG/250ML-% IV SOLN
0.0000 ug/min | INTRAVENOUS | Status: DC
  Filled 2018-03-16: qty 250

## 2018-03-16 MED ORDER — ALBUTEROL SULFATE (2.5 MG/3ML) 0.083% IN NEBU
3.0000 mL | INHALATION_SOLUTION | Freq: Four times a day (QID) | RESPIRATORY_TRACT | Status: DC | PRN
  Administered 2018-03-21: 3 mL via RESPIRATORY_TRACT
  Filled 2018-03-16: qty 3

## 2018-03-16 MED ORDER — ONDANSETRON HCL 4 MG/2ML IJ SOLN
INTRAMUSCULAR | Status: AC
  Filled 2018-03-16: qty 2

## 2018-03-16 MED ORDER — PROTAMINE SULFATE 10 MG/ML IV SOLN
INTRAVENOUS | Status: AC
  Filled 2018-03-16: qty 10

## 2018-03-16 MED ORDER — ORAL CARE MOUTH RINSE
15.0000 mL | Freq: Two times a day (BID) | OROMUCOSAL | Status: DC
  Administered 2018-03-16 – 2018-03-20 (×5): 15 mL via OROMUCOSAL

## 2018-03-16 MED ORDER — SODIUM CHLORIDE 0.9 % IV SOLN: INTRAVENOUS | Status: DC

## 2018-03-16 MED ORDER — MORPHINE SULFATE (PF) 2 MG/ML IV SOLN
2.0000 mg | INTRAVENOUS | Status: DC | PRN
  Administered 2018-03-16 – 2018-03-17 (×5): 2 mg via INTRAVENOUS
  Administered 2018-03-17: 4 mg via INTRAVENOUS
  Administered 2018-03-17: 2 mg via INTRAVENOUS
  Filled 2018-03-16: qty 2
  Filled 2018-03-16 (×2): qty 1
  Filled 2018-03-16: qty 2
  Filled 2018-03-16 (×2): qty 1

## 2018-03-16 MED ORDER — LIDOCAINE 2% (20 MG/ML) 5 ML SYRINGE
INTRAMUSCULAR | Status: DC | PRN
  Administered 2018-03-16: 50 mg via INTRAVENOUS

## 2018-03-16 MED ORDER — ONDANSETRON HCL 4 MG/2ML IJ SOLN
INTRAMUSCULAR | Status: DC | PRN
  Administered 2018-03-16: 4 mg via INTRAVENOUS

## 2018-03-16 MED ORDER — LIDOCAINE 2% (20 MG/ML) 5 ML SYRINGE
INTRAMUSCULAR | Status: AC
  Filled 2018-03-16: qty 5

## 2018-03-16 MED ORDER — LEVOFLOXACIN IN D5W 500 MG/100ML IV SOLN
INTRAVENOUS | Status: DC | PRN
  Administered 2018-03-16: 500 mg via INTRAVENOUS

## 2018-03-16 MED ORDER — SODIUM CHLORIDE 0.9 % IV SOLN
INTRAVENOUS | Status: AC
  Administered 2018-03-16: 12:00:00 via INTRAVENOUS

## 2018-03-16 MED ORDER — SODIUM CHLORIDE 0.9 % IV SOLN
INTRAVENOUS | Status: AC
  Filled 2018-03-16 (×3): qty 1.2

## 2018-03-16 MED ORDER — PROTAMINE SULFATE 10 MG/ML IV SOLN
INTRAVENOUS | Status: DC | PRN
  Administered 2018-03-16: 100 mg via INTRAVENOUS

## 2018-03-16 MED ORDER — DEXMEDETOMIDINE HCL IN NACL 400 MCG/100ML IV SOLN
INTRAVENOUS | Status: DC | PRN
  Administered 2018-03-16: 1 ug/kg/h via INTRAVENOUS

## 2018-03-16 MED ORDER — SODIUM CHLORIDE 0.9 % IV SOLN
250.0000 mL | INTRAVENOUS | Status: DC | PRN
  Administered 2018-03-17: 250 mL via INTRAVENOUS

## 2018-03-16 MED ORDER — SUCCINYLCHOLINE CHLORIDE 20 MG/ML IJ SOLN
INTRAMUSCULAR | Status: DC | PRN
  Administered 2018-03-16: 90 mg via INTRAVENOUS

## 2018-03-16 MED ORDER — ACETAMINOPHEN 650 MG RE SUPP: 650.0000 mg | Freq: Four times a day (QID) | RECTAL | Status: DC | PRN

## 2018-03-16 MED ORDER — LEVOFLOXACIN IN D5W 750 MG/150ML IV SOLN
750.0000 mg | INTRAVENOUS | Status: AC
  Administered 2018-03-17: 750 mg via INTRAVENOUS
  Filled 2018-03-16: qty 150

## 2018-03-16 MED ORDER — PROPOFOL 10 MG/ML IV BOLUS
INTRAVENOUS | Status: DC | PRN
  Administered 2018-03-16: 50 mg via INTRAVENOUS

## 2018-03-16 MED ORDER — VANCOMYCIN HCL 1000 MG IV SOLR
INTRAVENOUS | Status: DC | PRN
  Administered 2018-03-16: 1250 mg via INTRAVENOUS

## 2018-03-16 MED ORDER — OXYCODONE HCL 5 MG PO TABS
5.0000 mg | ORAL_TABLET | ORAL | Status: DC | PRN
  Administered 2018-03-17: 10 mg via ORAL
  Filled 2018-03-16: qty 2

## 2018-03-16 MED ORDER — SODIUM CHLORIDE 0.9 % IV SOLN
INTRAVENOUS | Status: DC | PRN
  Administered 2018-03-16: 12:00:00 via INTRAVENOUS

## 2018-03-16 MED ORDER — HEPARIN SODIUM (PORCINE) 1000 UNIT/ML IJ SOLN
INTRAMUSCULAR | Status: DC | PRN
  Administered 2018-03-16: 10000 [IU] via INTRAVENOUS

## 2018-03-16 SURGICAL SUPPLY — 107 items
ADAPTER UNIV SWAN GANZ BIP (ADAPTER) ×1 IMPLANT
ADAPTER UNV SWAN GANZ BIP (ADAPTER)
ADH SKN CLS APL DERMABOND .7 (GAUZE/BANDAGES/DRESSINGS)
ADPR CATH UNV NS SG CATH (ADAPTER)
ANTEGRADE CPLG (MISCELLANEOUS) IMPLANT
BAG DECANTER FOR FLEXI CONT (MISCELLANEOUS) ×3 IMPLANT
BAG SNAP BAND KOVER 36X36 (MISCELLANEOUS) ×3 IMPLANT
BATTERY MAXDRIVER (MISCELLANEOUS) ×4 IMPLANT
BLADE CLIPPER SURG (BLADE) ×3 IMPLANT
BLADE OSCILLATING /SAGITTAL (BLADE) IMPLANT
BLADE STERNUM SYSTEM 6 (BLADE) ×1 IMPLANT
CABLE ADAPT CONN TEMP 6FT (ADAPTER) ×3 IMPLANT
CANNULA FEM VENOUS REMOTE 22FR (CANNULA) IMPLANT
CANNULA OPTISITE PERFUSION 16F (CANNULA) IMPLANT
CANNULA OPTISITE PERFUSION 18F (CANNULA) IMPLANT
CANNULA SUMP PERICARDIAL (CANNULA) ×1 IMPLANT
CATH CROSS OVER TEMPO 5F (CATHETERS) IMPLANT
CATH DIAG EXPO 6F VENT PIG 145 (CATHETERS) ×3 IMPLANT
CATH S G BIP PACING (SET/KITS/TRAYS/PACK) ×8 IMPLANT
CLIP VESOCCLUDE MED 6/CT (CLIP) ×3 IMPLANT
CLIP VESOCCLUDE SM WIDE 6/CT (CLIP) ×3 IMPLANT
CONN ST 1/4X3/8  BEN (MISCELLANEOUS) ×2
CONN ST 1/4X3/8 BEN (MISCELLANEOUS) ×1 IMPLANT
CONT SPECI 4OZ STER CLIK (MISCELLANEOUS) ×5 IMPLANT
COVER BACK TABLE 24X17X13 BIG (DRAPES) ×3 IMPLANT
COVER DOME SNAP 22 D (MISCELLANEOUS) IMPLANT
CRADLE DONUT ADULT HEAD (MISCELLANEOUS) ×3 IMPLANT
DERMABOND ADVANCED (GAUZE/BANDAGES/DRESSINGS)
DERMABOND ADVANCED .7 DNX12 (GAUZE/BANDAGES/DRESSINGS) IMPLANT
DRAIN CHANNEL 28F RND 3/8 FF (WOUND CARE) ×3 IMPLANT
DRAPE INCISE IOBAN 66X45 STRL (DRAPES) IMPLANT
DRAPE SLUSH/WARMER DISC (DRAPES) ×3 IMPLANT
DRAPE SURG IRRIG POUCH 19X23 (DRAPES) ×4 IMPLANT
DRSG COVADERM 4X8 (GAUZE/BANDAGES/DRESSINGS) ×2 IMPLANT
DRSG TEGADERM 4X4.75 (GAUZE/BANDAGES/DRESSINGS) ×2 IMPLANT
ELECT BLADE 6.5 EXT (BLADE) ×2 IMPLANT
ELECT CAUTERY BLADE 6.4 (BLADE) ×3 IMPLANT
ELECT REM PT RETURN 9FT ADLT (ELECTROSURGICAL) ×6
ELECTRODE REM PT RTRN 9FT ADLT (ELECTROSURGICAL) ×2 IMPLANT
FELT TEFLON 6X6 (MISCELLANEOUS) ×3 IMPLANT
GAUZE SPONGE 4X4 12PLY STRL (GAUZE/BANDAGES/DRESSINGS) ×3 IMPLANT
GAUZE SPONGE 4X4 12PLY STRL LF (GAUZE/BANDAGES/DRESSINGS) ×2 IMPLANT
GLOVE BIO SURGEON STRL SZ7.5 (GLOVE) ×2 IMPLANT
GLOVE BIO SURGEON STRL SZ8 (GLOVE) ×6 IMPLANT
GLOVE EUDERMIC 7 POWDERFREE (GLOVE) ×4 IMPLANT
GLOVE ORTHO TXT STRL SZ7.5 (GLOVE) ×4 IMPLANT
GOWN STRL REUS W/ TWL LRG LVL3 (GOWN DISPOSABLE) ×3 IMPLANT
GOWN STRL REUS W/ TWL XL LVL3 (GOWN DISPOSABLE) ×6 IMPLANT
GOWN STRL REUS W/TWL LRG LVL3 (GOWN DISPOSABLE) ×9
GOWN STRL REUS W/TWL XL LVL3 (GOWN DISPOSABLE) ×18
GUIDEWIRE SAF TJ AMPL .035X180 (WIRE) ×1 IMPLANT
GUIDEWIRE STRAIGHT .035 260CM (WIRE) ×3 IMPLANT
GUIDEWIRE WHOLEY .035 145 JTIP (WIRE) ×1 IMPLANT
HEMOSTAT POWDER SURGIFOAM 1G (HEMOSTASIS) IMPLANT
INSERT FOGARTY SM (MISCELLANEOUS) ×3 IMPLANT
KIT DILATOR VASC 18G NDL (KITS) IMPLANT
KIT HEART LEFT (KITS) ×3 IMPLANT
KIT SUCTION CATH 14FR (SUCTIONS) ×3 IMPLANT
KIT TURNOVER KIT B (KITS) ×3 IMPLANT
LEAD PACING MYOCARDI (MISCELLANEOUS) IMPLANT
LOOP VESSEL MAXI BLUE (MISCELLANEOUS) IMPLANT
LOOP VESSEL MINI RED (MISCELLANEOUS) ×2 IMPLANT
NDL PERC 18GX7CM (NEEDLE) ×1 IMPLANT
NEEDLE 22X1 1/2 (OR ONLY) (NEEDLE) ×3 IMPLANT
NEEDLE PERC 18GX7CM (NEEDLE) ×3 IMPLANT
NS IRRIG 1000ML POUR BTL (IV SOLUTION) ×15 IMPLANT
PACK ENDOVASCULAR (PACKS) ×3 IMPLANT
PAD ARMBOARD 7.5X6 YLW CONV (MISCELLANEOUS) ×6 IMPLANT
PAD ELECT DEFIB RADIOL ZOLL (MISCELLANEOUS) ×3 IMPLANT
PENCIL BUTTON HOLSTER BLD 10FT (ELECTRODE) ×3 IMPLANT
RETRACTOR TRL SOFT TISSUE LG (INSTRUMENTS) IMPLANT
RETRACTOR TRM SOFT TISSUE 7.5 (INSTRUMENTS) IMPLANT
SHEATH AVANTI 11CM 8FR (SHEATH) IMPLANT
SHEATH PINNACLE 6F 10CM (SHEATH) ×6 IMPLANT
SLEEVE REPOSITIONING LENGTH 30 (MISCELLANEOUS) ×3 IMPLANT
SPONGE LAP 4X18 RFD (DISPOSABLE) ×3 IMPLANT
STOPCOCK MORSE 400PSI 3WAY (MISCELLANEOUS) ×4 IMPLANT
SUT BONE WAX W31G (SUTURE) IMPLANT
SUT ETHIBOND X763 2 0 SH 1 (SUTURE) ×6 IMPLANT
SUT PROLENE 2 0 MH 48 (SUTURE) ×6 IMPLANT
SUT PROLENE 4 0 RB 1 (SUTURE)
SUT PROLENE 4-0 RB1 .5 CRCL 36 (SUTURE) ×1 IMPLANT
SUT SILK  1 MH (SUTURE) ×2
SUT SILK 1 MH (SUTURE) ×1 IMPLANT
SUT SILK 2 0 SH CR/8 (SUTURE) ×3 IMPLANT
SUT VIC AB 1 CTX 36 (SUTURE) ×3
SUT VIC AB 1 CTX36XBRD ANBCTR (SUTURE) ×1 IMPLANT
SUT VIC AB 2-0 CTX 36 (SUTURE) ×6 IMPLANT
SUT VIC AB 3-0 X1 27 (SUTURE) ×4 IMPLANT
SUT VICRYL 2 TP 1 (SUTURE) ×2 IMPLANT
SYR 3ML LL SCALE MARK (SYRINGE) ×1 IMPLANT
SYR 50ML LL SCALE MARK (SYRINGE) ×3 IMPLANT
SYR 5ML LL (SYRINGE) ×1 IMPLANT
SYR BULB IRRIGATION 50ML (SYRINGE) ×2 IMPLANT
SYSTEM SAHARA CHEST DRAIN ATS (WOUND CARE) ×3 IMPLANT
TAPE CLOTH SURG 4X10 WHT LF (GAUZE/BANDAGES/DRESSINGS) ×2 IMPLANT
TOWEL GREEN STERILE (TOWEL DISPOSABLE) ×3 IMPLANT
TOWEL GREEN STERILE FF (TOWEL DISPOSABLE) ×3 IMPLANT
TOWEL OR NON WOVEN STRL DISP B (DISPOSABLE) ×1 IMPLANT
TRANSDUCER W/STOPCOCK (MISCELLANEOUS) ×6 IMPLANT
TRAY FOLEY SLVR 16FR TEMP STAT (SET/KITS/TRAYS/PACK) ×3 IMPLANT
TUBING HIGH PRESSURE 120CM (CONNECTOR) ×1 IMPLANT
VALVE HRT TRANSCATH CERT 26MM (Valve) ×2 IMPLANT
WIRE AMPLATZ SS-J .035X180CM (WIRE) ×2 IMPLANT
WIRE BENTSON .035X145CM (WIRE) ×1 IMPLANT
WIRE EMERALD 3MM-J .035X150CM (WIRE) ×2 IMPLANT
YANKAUER SUCT BULB TIP NO VENT (SUCTIONS) ×3 IMPLANT

## 2018-03-16 NOTE — Progress Notes (Signed)
Updated charge nurse Trilby Drummer RN on pt continuing confusion, yelling out, extreme restlessness, bending/movement of right leg with transvenous pacer in right femoral site. Bed alarm on, all tubes/lines out of sight, floor mats in place, and staff member sitting outside of room for close observation. Brought up the possibility of a getting a Air cabin crew for the pt. Charge RN recommends continuing above measures and staff taking turns sitting outside of the patients room without pulling a staff member to sit inside patient's room for direction observation and safety.

## 2018-03-16 NOTE — Anesthesia Procedure Notes (Signed)
Arterial Line Insertion Start/End9/17/2019 6:45 AM, 03/16/2018 6:55 AM Performed by: Moshe Salisbury, CRNA, CRNA  Patient location: Pre-op. Preanesthetic checklist: patient identified, IV checked, site marked, risks and benefits discussed, surgical consent, monitors and equipment checked, pre-op evaluation, timeout performed and anesthesia consent Lidocaine 1% used for infiltration and patient sedated Right, radial was placed Catheter size: 20 G Hand hygiene performed , maximum sterile barriers used  and Seldinger technique used Allen's test indicative of satisfactory collateral circulation Attempts: 1 Procedure performed without using ultrasound guided technique. Following insertion, dressing applied and Biopatch. Post procedure assessment: normal  Patient tolerated the procedure well with no immediate complications.

## 2018-03-16 NOTE — Progress Notes (Signed)
  [facility] VALVE TEAM  Patient doing well s/p TAVR. He had CHB after valve deployment and temp pacing wire left in place. He is hemodynamically stable. A line pressures are high but cuff pressures good. He was treated with IV nitro, but this has been discontinued.  Plan to DC arterial line. Groin site and chest wall site are stable. ECG shows sinus with new LBBB and 1st deg AV block but with no high grade block. Pacer currently off and he has not required pacing since transfer to ICU. Will discuss timing of temp wire removal with Dr. Barry Dienes.  He is a little disoriented and complaining of back and shoulder pain. He is irritated that he cannot move as this would make him feel better. I will order a soft diet for him as he has achalasia and no teeth.  Angelena Form PA-C  [facility]  Pager [phone]

## 2018-03-16 NOTE — Progress Notes (Signed)
  Echocardiogram 2D Echocardiogram Limited during TAVR has been performed.  Robert Schaefer M 03/16/2018, 10:19 AM

## 2018-03-16 NOTE — Interval H&P Note (Signed)
History and Physical Interval Note:  03/16/2018 6:56 AM  Robert Schaefer  has presented today for surgery, with the diagnosis of Severe Aortic Stenosis  The various methods of treatment have been discussed with the patient and family. After consideration of risks, benefits and other options for treatment, the patient has consented to  Procedure(s): TRANSCATHETER AORTIC VALVE REPLACEMENT, TRANSAPICAL (N/A) as a surgical intervention .  The patient's history has been reviewed, patient examined, no change in status, stable for surgery.  I have reviewed the patient's chart and labs.  Questions were answered to the patient's satisfaction.     Gaye Pollack

## 2018-03-16 NOTE — Op Note (Signed)
[facility]  TAVR OPERATIVE NOTE   Date of Procedure:  03/16/2018  Preoperative Diagnosis: Severe Aortic Stenosis   Postoperative Diagnosis: Same   Procedure:    Transcatheter Aortic Valve Replacement - Transapical Approach  Edwards Sapien 3 THV (size 26 mm, model # 9600TFX, serial # [id number])   Co-Surgeons:  Gilford Raid, MD and Sherren Mocha, MD    Anesthesiologist:  Annye Asa, MD  Echocardiographer:  Ena Dawley, MD  Pre-operative Echo Findings:  Severe aortic stenosis  Normal left ventricular systolic function   BRIEF CLINICAL NOTE AND INDICATIONS FOR SURGERY  Robert Schaefer is a 77 y.o. male presenting for TAVR.  The patient has been followed by Dr. [name] for lower extremity peripheral arterial disease and aortic stenosis.  He has undergone bilateral iliac stenting.  The patient has developed progressive and now critical aortic stenosis with associated symptoms of shortness of breath and fatigue.  He has been evaluated by Dr. Cyndia Bent with plans for TAVR for definitive treatment of his aortic stenosis.  He is undergone extensive evaluation including CTA studies, echo assessment, and cardiac catheterization.  He has no coronary artery disease.  The patient's iliac stents are patent but not of adequate dimensions to allow for transfemoral access for TAVR.  Because of diffuse vascular disease, he will undergo transapical access for TAVR.  He has occasional chest pain, but primarily is limited by shortness of breath which occurs with just walking up his driveway (NYHA III).  He also has fairly marked fatigue.  He denies frank syncope but has occasional lightheadedness.  He has developed problems related to an esophageal diverticulum and has a lot of difficulty swallowing.  He has lost 50 pounds over the past year or 2.  During the course of the patient's preoperative work up they have been evaluated comprehensively by a multidisciplinary team of  specialists coordinated through the [facility] in the [facility].  They have been demonstrated to suffer from symptomatic severe aortic stenosis as noted above. The patient has been counseled extensively as to the relative risks and benefits of all options for the treatment of severe aortic stenosis including long term medical therapy, conventional surgery for aortic valve replacement, and transcatheter aortic valve replacement.  The patient has been independently evaluated by cardiac surgery by Dr Cyndia Bent, and they are felt to be at high risk for conventional surgical aortic valve replacement. Based upon review of all of the patient's preoperative diagnostic tests they are felt to be candidate for transcatheter aortic valve replacement using the transapical approach as an alternative to high risk conventional surgery.    Following the decision to proceed with transcatheter aortic valve replacement, a discussion has been held regarding what types of management strategies would be attempted intraoperatively in the event of life-threatening complications, including whether or not the patient would be considered a candidate for the use of cardiopulmonary bypass and/or conversion to open sternotomy for attempted surgical intervention.  The patient has been advised of a variety of complications that might develop peculiar to this approach including but not limited to risks of death, stroke, paravalvular leak, aortic dissection or other major vascular complications, aortic annulus rupture, device embolization, cardiac rupture or perforation, acute myocardial infarction, arrhythmia, heart block or bradycardia requiring permanent pacemaker placement, congestive heart failure, respiratory failure, renal failure, pneumonia, infection, other late complications related to structural valve deterioration or migration, or other complications that might ultimately cause a  temporary or permanent  loss of functional independence or other long term morbidity.  The patient provides full informed consent for the procedure as described and all questions were answered preoperatively.    DETAILS OF THE OPERATIVE PROCEDURE  PREPARATION:   The patient is brought to the operating room on the above mentioned date and central monitoring was established by the anesthesia team including placement of a central venous line and radial arterial line. The patient is placed in the supine position on the operating table.  Intravenous antibiotics are administered. General endotracheal anesthesia is induced uneventfully.  TEE is not performed because of esophageal diverticulum. The patient's chest, abdomen, both groins, and both lower extremities are prepared and draped in a sterile manner. A time out procedure is performed.  PERIPHERAL ACCESS:   Using the modified Seldinger technique under direct US guidance, femoral arterial and venous access were obtained with placement of 6 Fr sheaths on the right side. [place] images are captured and stored in the patient's chart. A pigtail diagnostic catheter was passed through the femoral arterial sheath under fluoroscopic guidance into the aortic root.  A temporary transvenous pacemaker catheter was passed through the femoral venous sheath under fluoroscopic guidance into the right ventricle.  The pacemaker was tested to ensure stable lead placement and pacemaker capture. Aortic root angiography was performed in order to determine the optimal angiographic angle for valve deployment.  TRANSAPICAL ACCESS AND TRANSCATHETER HEART VALVE DEPLOYMENT:  Please see the complete operative note of Dr Cyndia Bent for details of transapical access.    After a 6Fr sheath is placed in the LV apex, a J-wire is derected into the descending thoracic aorta and exchanged out for an Amplatz Extra-stiff wire.  A Certitude transapical sheath is inserted without difficulty. An  Edwards Sapien 3 THV (size 26 mm) was prepared and crimped per manufacturer's guidelines, and the proper orientation of the valve is confirmed on the [place] certitude delivery system. The valve is inserted into appropriate position at the site of the native aortic valve. Once final position of the valve has been confirmed by angiographic assessment, the valve is deployed while temporarily holding ventilation and during rapid ventricular pacing to maintain systolic blood pressure < 50 mmHg and pulse pressure < 10 mmHg. The balloon inflation is held for >3 seconds after reaching full deployment volume. The valve is removed and an aortogram is performed to evaluate THV competence. There is no paravalvular regurgitation identified. The wire and balloon catheter are then removed from the sheath.  PROCEDURE COMPLETION:  The sheath was then removed and LV apex is repaired by Dr Cyndia Bent. Please see his separate report for details.  Protamine was administered once trans-apical repair was complete. The pigtail catheter and femoral sheath was removed with manual pressure used for hemostasis. The temporary pacing wire is left in place as the patient had transient heart block following valve deployment.   The patient tolerated the procedure well and is transported to the surgical intensive care in stable condition. There were no immediate intraoperative complications. All sponge instrument and needle counts are verified correct at completion of the operation.   The patient received a total of 61 mL of intravenous contrast during the procedure.  Sherren Mocha MD 03/16/2018 10:57 AM

## 2018-03-16 NOTE — Anesthesia Procedure Notes (Addendum)
Central Venous Catheter Insertion Performed by: Annye Asa, MD, anesthesiologist Start/End9/17/2019 6:54 AM, 03/16/2018 7:12 AM Patient location: Pre-op. Preanesthetic checklist: patient identified, IV checked, site marked, risks and benefits discussed, surgical consent, monitors and equipment checked, pre-op evaluation, timeout performed and anesthesia consent Position: Trendelenburg Lidocaine 1% used for infiltration and patient sedated Hand hygiene performed , maximum sterile barriers used  and Seldinger technique used Catheter size: 8 Fr Central line was placed.Double lumen Procedure performed using ultrasound guided technique. Ultrasound Notes:anatomy identified, needle tip was noted to be adjacent to the nerve/plexus identified, no ultrasound evidence of intravascular and/or intraneural injection and image(s) printed for medical record Attempts: 1 Following insertion, line sutured, dressing applied and Biopatch. Post procedure assessment: blood return through all ports, free fluid flow and no air  Patient tolerated the procedure well with no immediate complications. Additional procedure comments: CVP: Timeout, sterile prep, drape, FBP R neck.  Trendelenburg position.  1% lido local, finder and trocar RIJ 1st pass with US guidance.  2 lumen placed over J wire. Biopatch and sterile dressing on.  Patient tolerated well.  VSS.  Jenita Seashore, MD.

## 2018-03-16 NOTE — Progress Notes (Signed)
Patient ID: [id number] STRANGE, male   DOB: 02-Jul-1940, 77 y.o.   MRN: [id number] EVENING ROUNDS NOTE :     [address]       [place] [address]             [phone]                 Day of Surgery Procedure(s) (LRB): TRANSCATHETER AORTIC VALVE REPLACEMENT, TRANSAPICAL (Left)  Total Length of Stay:  LOS: 0 days  BP 111/64   Pulse (!) 101   Temp 98.6 F (37 C)   Resp (!) 22   Ht 5\' 5"  (1.651 m)   Wt 63 kg   SpO2 98%   BMI 23.11 kg/m   .Intake/Output      09/16 0701 - 09/17 0700 09/17 0701 - 09/18 0700   I.V. (mL/kg)  2003.9 (31.8)   IV Piggyback  63.5   Total Intake(mL/kg)  2067.4 (32.8)   Urine (mL/kg/hr)  585 (0.8)   Blood  75   Chest Tube  190   Total Output  850   Net  +1217.4          . sodium chloride 50 mL/hr at 03/16/18 1700  . sodium chloride    . sodium chloride 20 mL/hr at 03/16/18 1700  . lactated ringers 20 mL/hr at 03/16/18 1700  . [START ON 03/17/2018] levofloxacin (LEVAQUIN) IV    . nitroGLYCERIN Stopped (03/16/18 1304)  . phenylephrine (NEO-SYNEPHRINE) Adult infusion    . vancomycin 1,000 mg (03/16/18 1802)     Lab Results  Component Value Date   WBC 4.2 03/11/2018   HGB 9.5 (L) 03/16/2018   HCT 28.0 (L) 03/16/2018   PLT 105 (L) 03/11/2018   GLUCOSE 178 (H) 03/16/2018   CHOL 141 02/26/2017   TRIG 58 02/26/2017   HDL 50 02/26/2017   LDLCALC 78 02/26/2017   ALT 12 03/11/2018   AST 17 03/11/2018   NA 140 03/16/2018   K 3.8 03/16/2018   CL 103 03/16/2018   CREATININE 0.70 03/16/2018   BUN 14 03/16/2018   CO2 22 03/11/2018   TSH 3.10 02/26/2017   PSA 6.85 (H) 12/10/2015   INR 1.04 03/11/2018   HGBA1C 4.9 03/11/2018   MICROALBUR 2.5 02/26/2017   Venous pacer in place  Sinus, pacer off No bleeding from chest tube    Grace Isaac MD  Beeper [phone] Office [phone] 03/16/2018 6:09 PM

## 2018-03-16 NOTE — Op Note (Signed)
CARDIOTHORACIC SURGERY OPERATIVE NOTE  Date of Procedure:  03/16/2018  Preoperative Diagnosis: Severe Aortic Stenosis   Postoperative Diagnosis: Same   Procedure:    Transcatheter Aortic Valve Replacement - Transapical Approach  Edwards Sapien 3 THV (size 26 mm, model # 9600TFX, serial # [id number])   Co-Surgeons:  Gaye Pollack, MD and Sherren Mocha, MD   Anesthesiologist:  Annye Asa, MD  Echocardiographer:  Ena Dawley, MD  Pre-operative Echo Findings:   severe aortic stenosis   normal left ventricular systolic function   Post-operative Echo Findings:  no paravalvular leak  normal left ventricular systolic function   BRIEF CLINICAL NOTE AND INDICATIONS FOR SURGERY  The patient is a 78 year old gentleman with a history of hypertension, hyperlipidemia, hypothyroidism, obstructive sleep apnea intolerant to CPAP due to claustrophobia, previous heavy smoking with COPD, prostate cancer status diagnosed in 2018 status post XRT, aortoiliac vascular disease status post bilateral common iliac artery stenting, and aortic stenosis. Over the past year or so the patient has had significant swallowing difficulty and was diagnosed with achalasia. He has lost about 50 pounds over the past year. He was seen by Dr. Johney Maine and was felt that he would require surgical treatment but required cardiac clearance. He was evaluated by Dr. [name] and had an echocardiogram on 01/01/2018 which showed a severely calcified aortic valve with a mean gradient of 59 mmHg and a peak gradient of 120 mmHg. Dimensionless index was 0.23. Left ventricular ejection fraction was 60 to 65% with grade 1 diastolic dysfunction. He underwent cardiac catheterization on 01/25/2018 which showed no significant coronary disease. The aortic valve gradient was 50 mmHg. Aortic valve area was 0.55 cm. LVEDP was 23. PA pressure was 37/14 with a mean wedge pressure of 13. Bilateral iliac artery stents were patent.   He has stage  D, severe, symptomatic aortic stenosis and moderate to severe aortic insufficiency with New York Heart Association class III symptoms of exertional fatigue and shortness of breath consistent with chronic diastolic congestive heart failure. I agree that aortic valve replacement is indicated in this patient for relief of his symptoms and to prevent left ventricular deterioration and worsening congestive heart failure. I think his operative risk for open surgical aortic valve replacement would be at least moderate due to his multiple comorbid risk factors including his age, diffuse vascular disease, COPD, recent prostate cancer with XRT, achalasia with significant dysphasia and weight loss. I have personally reviewed his echocardiogram, cardiac catheterization, and CT scan of the chest done 01/05/2017 to follow-up on a cavitary left upper lobe lung lesion. His echocardiogram shows a trileaflet,severely calcified aortic valve with slight motion of only the left cusp. The mean gradient has increased to 59 mmHg consistent with severe aortic stenosis. There is moderate to severe aortic insufficiency. Left ventricular ejection fraction is normal at 60 to 65% with grade 1 diastolic dysfunction. Cardiac catheterization shows no significant coronary disease and confirmed severe aortic stenosis with a mean gradient of 50 mmHg. CT scan of the chest shows decreased size of the left upper lobe cavitary lung lesion suggesting that this is most likely postinfectious scarring. He does have some calcification of the ascending aorta. There is also extensive calcification of the aortic arch and branch vessels. His gated cardiac CTA shows anatomy that is suitable for transcatheter aortic valve replacement using a Sapien 3 valve. His abdominal and pelvic CTA shows severe abdominal aortic and iliac vascular disease with significant narrowing within the bilateral iliac stents. Transfemoral access is not an option.  He has significant  calcification at the origin of his left common carotid and left subclavian artery and I think the best access in this gentleman is going to be transapical. He has a mildly dilated left ventricle with a normal ejection fraction. I reviewed the transcatheter aortic valve procedure by transapical insertion with the patient and his wife and daughter. We discussed complications that might develop including but not limited to risks of death, stroke, paravalvular leak, aortic dissection or other major vascular complications, aortic annulus rupture, device embolization, cardiac rupture or perforation, mitral regurgitation, acute myocardial infarction, arrhythmia, heart block or bradycardia requiring permanent pacemaker placement, congestive heart failure, respiratory failure, renal failure, pneumonia, infection, other late complications related to structural valve deterioration or migration, or other complications that might ultimately cause a temporary or permanent loss of functional independence or other long term morbidity. The patient provides full informed consent for the procedure as described and all questions were answered.  DETAILS OF THE OPERATIVE PROCEDURE  PREPARATION:    The patient is brought to the operating room on the above mentioned date and central monitoring was established by the anesthesia team including placement of Swan-Ganz catheter and radial arterial line. The patient is placed in the supine position on the operating table.  Intravenous antibiotics are administered. General endotracheal anesthesia is induced uneventfully. A Foley catheter is placed.  Baseline 2-D echocardiogram was performed.  Findings were notable for severe calcific aortic stenosis with moderate to severe AI.  The patient's chest, abdomen, both groins, and both lower extremities are prepared and draped in a sterile manner. A time out procedure is performed.   PERIPHERAL ACCESS:    Femoral arterial and venous  access is obtained with placement of 6 Fr arterial and venous sheaths on the right side.  A pigtail diagnostic catheter is passed through the right arterial sheath under fluoroscopic guidance into the aortic root.  A temporary transvenous pacemaker catheter is passed through the right femoral venous sheath under fluoroscopic guidance into the right ventricle.  The pacemaker is tested to ensure stable lead placement and pacemaker capture.   TRANSAPICAL ACCESS:   The location of the left ventricular apex is confirmed using flouroscopy, and a miniature left thoracotomy incision is made directly over the left ventricular apex.  The left pleural space is entered.  No adhesions are encountered.  A soft tissue retractor is placed and the ribs gently spread to expose the pericardial surface.  A longitudinal incision is made in the pericardium and the left ventricular apex inspected.  There are no intrapericardial adhesions.  An appropriate site for left ventricular sheath placement is chosen well lateral to the left anterior descending coronary artery where a dimple was palpable.  Two pursestring sutures are placed using pledgeted 2-0 Prolene sutures in an overlapping triangular orientation.   The patient is heparinized systemically and ACT verified > 250 seconds.  The left ventricular apex is punctured using an  18 gauge needle and a soft J-tipped guidewire is passed into the left ventricle and through the aortic valve under fluoroscopic guidance.  A 6 Fr sheath is placed over the guidewire and across the aortic valve.  A JR-4 catheter is passed through the sheath and maneuvered around the aortic arch into the descending thoracic aorta under fluoroscopic guidance.  An Amplatz extra stiff guidewire is passed through the JR-4 catheter into the descending thoracic aorta and both the JR-4 catheter and the introducing sheath are removed.  A Certitude introducer sheath is passed over  the guidewire into the left ventricle.   The sheath position is continuously monitored and secured by the surgical assistant.    BALLOON AORTIC VALVULOPLASTY:  Not performed.   TRANSCATHETER HEART VALVE DEPLOYMENT:  An Edwards Sapien 3 transcatheter heart valve (size 26 mm, model # 9300TFX, serial #[id number]) is prepared and crimped per manufacturer's guidelines, and the proper orientation of the valve is confirmed on the [employer] Certitude delivery system.  The delivery system loader is advanced into the introducing sheath.  The valve and delivery system are advanced through the loader into the sheath and all air is evacuated.  The valve and balloon are advanced into the left ventricle and part way through the aortic valve.  The valve is then finely positioned in the aortic valve.  Once final position of the valve has been confirmed, the valve is deployed while temporarily holding ventilation and during rapid ventricular pacing to maintain systolic blood pressure < 50 mmHg and pulse pressure < 10 mmHg.  The balloon inflation is held for >3 seconds after reaching full deployment volume.  Once the balloon has fully deflated the balloon is retracted into the left ventricle and valve function is assessed using an aortogram. There is felt to be no paravalvular leak and no central aortic insufficiency.  Left ventricular function is  unchanged from preoperatively.  There is no mitral regurgitation.  The patient's hemodynamic recovery following valve deployment is excellent.   PROCEDURE COMPLETION:  The left ventricular sheath is removed during rapid ventricular pacing to maintain systolic blood pressure < 70 mmHg while the pursestring sutures are tied.  The apical closure is inspected and notably hemostatic.  Protamine is administered.    Once hemostasis has been ascertained, the left pleural space is drained using a single 28 Fr Bard chest tube. The divided costal cartilage was approximated using a sternal wire. Then 3 pericostal sutures were  placed and the ribs approximated.  The mini thoracotomy incision is closed in layers and the skin incision closed using a subcuticular skin closure.  The temporary pacemaker was left in place due to sinus bradycardia and new left bundle branch morphology. The pigtail catheters and all femoral sheaths are removed.  The patient tolerated the procedure well and is transported to the surgical intensive care in stable condition. There are no intraoperative complications. All sponge instrument and needle counts are verified correct at completion of the operation.  No blood products were administered during the operation.  The patient received a total of 61 mL of intravenous contrast during the procedure.   Gaye Pollack, MD 03/16/2018 1:18 PM

## 2018-03-16 NOTE — Transfer of Care (Signed)
Immediate Anesthesia Transfer of Care Note  Patient: Robert Schaefer  Procedure(s) Performed: TRANSCATHETER AORTIC VALVE REPLACEMENT, TRANSAPICAL (Left Chest)  Patient Location: ICU  Anesthesia Type:General  Level of Consciousness: awake and patient cooperative  Airway & Oxygen Therapy: Patient Spontanous Breathing and Patient connected to nasal cannula oxygen  Post-op Assessment: Report given to RN, Post -op Vital signs reviewed and stable and Patient moving all extremities  Post vital signs: Reviewed and stable  Last Vitals:  Vitals Value Taken Time  BP    Temp    Pulse 0 03/16/2018 10:38 AM  Resp    SpO2      Last Pain:  Vitals:   03/16/18 0622  TempSrc: Oral  PainSc:          Complications: No apparent anesthesia complications

## 2018-03-16 NOTE — Anesthesia Procedure Notes (Signed)
Procedure Name: Intubation Date/Time: 03/16/2018 7:49 AM Performed by: Moshe Salisbury, CRNA Pre-anesthesia Checklist: Patient identified, Emergency Drugs available, Suction available and Patient being monitored Patient Re-evaluated:Patient Re-evaluated prior to induction Oxygen Delivery Method: Circle System Utilized Preoxygenation: Pre-oxygenation with 100% oxygen Induction Type: IV induction Laryngoscope Size: Mac and 4 Grade View: Grade I Tube type: Oral Tube size: 8.0 mm Number of attempts: 1 Airway Equipment and Method: Stylet and Oral airway Placement Confirmation: ETT inserted through vocal cords under direct vision,  positive ETCO2 and breath sounds checked- equal and bilateral Secured at: 21 cm Tube secured with: Tape Dental Injury: Teeth and Oropharynx as per pre-operative assessment  Comments: Less than 25 ml emesis noted in oropharynx after induction and suctioned out before laryngoscopy

## 2018-03-17 ENCOUNTER — Inpatient Hospital Stay (HOSPITAL_COMMUNITY): Payer: Medicare Other

## 2018-03-17 ENCOUNTER — Encounter (HOSPITAL_COMMUNITY): Payer: Self-pay | Admitting: Cardiovascular Disease

## 2018-03-17 DIAGNOSIS — I10 Essential (primary) hypertension: Secondary | ICD-10-CM

## 2018-03-17 DIAGNOSIS — Z954 Presence of other heart-valve replacement: Secondary | ICD-10-CM

## 2018-03-17 DIAGNOSIS — I350 Nonrheumatic aortic (valve) stenosis: Secondary | ICD-10-CM

## 2018-03-17 LAB — BASIC METABOLIC PANEL
ANION GAP: 6 (ref 5–15)
BUN: 12 mg/dL (ref 8–23)
CO2: 27 mmol/L (ref 22–32)
Calcium: 9 mg/dL (ref 8.9–10.3)
Chloride: 104 mmol/L (ref 98–111)
Creatinine, Ser: 0.65 mg/dL (ref 0.61–1.24)
GFR calc Af Amer: >60 mL/min (ref >60)
GLUCOSE: 121 mg/dL — AB (ref 70–99)
POTASSIUM: 4.4 mmol/L (ref 3.5–5.1)
Sodium: 137 mmol/L (ref 135–145)

## 2018-03-17 LAB — CBC
HEMATOCRIT: 28.7 % — AB (ref 39.0–52.0)
HEMOGLOBIN: 9.6 g/dL — AB (ref 13.0–17.0)
MCH: 32.8 pg (ref 26.0–34.0)
MCHC: 33.4 g/dL (ref 30.0–36.0)
MCV: 98 fL (ref 78.0–100.0)
PLATELETS: 108 10*3/uL — AB (ref 150–400)
RBC: 2.93 MIL/uL — AB (ref 4.22–5.81)
RDW: 13.9 % (ref 11.5–15.5)
WBC: 8.7 10*3/uL (ref 4.0–10.5)

## 2018-03-17 LAB — ECHOCARDIOGRAM COMPLETE
Height: 65 in
Weight: 2222.24 oz

## 2018-03-17 LAB — MAGNESIUM: Magnesium: 1.9 mg/dL (ref 1.7–2.4)

## 2018-03-17 MED FILL — Phenylephrine HCl IV Soln 10 MG/ML: INTRAVENOUS | Qty: 2 | Status: AC

## 2018-03-17 MED FILL — Heparin Sodium (Porcine) Inj 1000 Unit/ML: INTRAMUSCULAR | Qty: 30 | Status: AC

## 2018-03-17 MED FILL — Sodium Chloride IV Soln 0.9%: INTRAVENOUS | Qty: 250 | Status: AC

## 2018-03-17 MED FILL — Potassium Chloride Inj 2 mEq/ML: INTRAVENOUS | Qty: 40 | Status: AC

## 2018-03-17 MED FILL — Magnesium Sulfate Inj 50%: INTRAMUSCULAR | Qty: 10 | Status: AC

## 2018-03-17 NOTE — Progress Notes (Signed)
CARDIAC REHAB PHASE I   PRE:  Rate/Rhythm: 97 SR  BP:  Supine:   Sitting: 131/74  Standing:    SaO2: 93%RA  MODE:  Ambulation: chair ft   POST:  Rate/Rhythm: 110 ST   1415-1442 Pt confused. Reoriented to hospital and need to stay until he is stronger. Pt did not want to attempt walking but agreed to go to recliner. Pt assisted to recliner with asst x 2. Pt had difficulty moving feet and stated he had to tell his feet to move. Helped to recliner and applied chair alarm. Pt made comments like he did not want the police to get him. Call light in hand and encouraged to call if he needs to get up. Will need PT eval.     Graylon Good, RN BSN  03/17/2018 2:36 PM

## 2018-03-17 NOTE — Progress Notes (Signed)
  Echocardiogram 2D Echocardiogram has been performed. Wound under left breast region. Tube in place. Unable to assess apicals accurately.   Robert Schaefer 03/17/2018, 9:56 AM

## 2018-03-17 NOTE — Progress Notes (Addendum)
Temporary pacing wire removed at bedside by Dr. Cyndia Bent. Per Dr. Cyndia Bent, bed rest for one hour starting 0940.

## 2018-03-17 NOTE — Progress Notes (Addendum)
[facility] VALVE TEAM  Patient Name: Robert Schaefer Date of Encounter: 03/17/2018  Primary Cardiologist: Dr. [name] / Dr. Burt Knack & Dr. Cyndia Bent (TAVR)  Hospital Problem List     Principal Problem:   S/P TAVR (transcatheter aortic valve replacement) Active Problems:   Essential hypertension   GERD   Hyperlipidemia   RLS (restless legs syndrome)   Hypothyroidism   BPH w/LUTS   PVD (peripheral vascular disease) (HCC)   Esophageal stricture   Prostate cancer (HCC)   OSA (obstructive sleep apnea)   Achalasia   Severe aortic stenosis     Subjective   No complaints. No chest pain or SOB. Not currently complaining of any pain. Had some restlessness and confusion last night. Better this am.  Inpatient Medications    Scheduled Meds: . aspirin  81 mg Oral Daily  . Chlorhexidine Gluconate Cloth  6 each Topical Daily  . clopidogrel  75 mg Oral Q breakfast  . levothyroxine  50 mcg Oral QAC breakfast  . mouth rinse  15 mL Mouth Rinse BID  . sodium chloride flush  10-40 mL Intracatheter Q12H  . sodium chloride flush  3 mL Intravenous Q12H   Continuous Infusions: . sodium chloride    . sodium chloride 20 mL/hr at 03/17/18 0653  . lactated ringers 20 mL/hr at 03/17/18 0653  . levofloxacin (LEVAQUIN) IV    . nitroGLYCERIN Stopped (03/16/18 1304)  . phenylephrine (NEO-SYNEPHRINE) Adult infusion     PRN Meds: sodium chloride, sodium chloride, acetaminophen **OR** acetaminophen, albuterol, fluticasone, hydrALAZINE, metoprolol tartrate, morphine injection, ondansetron (ZOFRAN) IV, oxyCODONE, sodium chloride flush, sodium chloride flush, traMADol   Vital Signs    Vitals:   03/17/18 0500 03/17/18 0600 03/17/18 0700 03/17/18 0753  BP: (!) 138/58 (!) 108/46 (!) 132/115   Pulse: 64 (!) 57 90   Resp: 17 14 (!) 21   Temp:    98.5 F (36.9 C)  TempSrc:    Oral  SpO2: 100% 100% 100%   Weight:      Height:        Intake/Output Summary (Last  24 hours) at 03/17/2018 0808 Last data filed at 03/17/2018 0700 Gross per 24 hour  Intake 2893.1 ml  Output 1885 ml  Net 1008.1 ml   Filed Weights   03/16/18 0604 03/16/18 0622  Weight: 63 kg 63 kg    Physical Exam   GEN: Well nourished, well developed, in no acute distress. A little disoriented this AM. Getting echo HEENT: Grossly normal.  Neck: Supple, no JVD, carotid bruits, or masses. Cardiac: RRR, no murmurs, rubs, or gallops. No clubbing, cyanosis, edema.  Radials/DP/PT 2+ and equal bilaterally.  Respiratory:  Course breath sounds and faint wheezing in precordium GI: Soft, nontender, nondistended, BS + x 4. MS: no deformity or atrophy. Skin: warm and dry, no rash. Chest tube site healing well. No erythema or ecchymosis. Right groin soft with temp pacer in place. Neuro:  Strength and sensation are intact. Psych: AAOx3.  Normal affect.  Labs    CBC Recent Labs    03/16/18 1109 03/17/18 0458  WBC  --  8.7  HGB 9.5* 9.6*  HCT 28.0* 28.7*  MCV  --  98.0  PLT  --  789*   Basic Metabolic Panel Recent Labs    03/16/18 0939 03/16/18 1109 03/17/18 0458  NA 138 140 137  K 4.2 3.8 4.4  CL 103  --  104  CO2  --   --  27  GLUCOSE 139* 178* 121*  BUN 14  --  12  CREATININE 0.70  --  0.65  CALCIUM  --   --  9.0  MG  --   --  1.9   Liver Function Tests No results for input(s): AST, ALT, ALKPHOS, BILITOT, PROT, ALBUMIN in the last 72 hours. No results for input(s): LIPASE, AMYLASE in the last 72 hours. Cardiac Enzymes No results for input(s): CKTOTAL, CKMB, CKMBINDEX, TROPONINI in the last 72 hours. BNP Invalid input(s): POCBNP D-Dimer No results for input(s): DDIMER in the last 72 hours. Hemoglobin A1C No results for input(s): HGBA1C in the last 72 hours. Fasting Lipid Panel No results for input(s): CHOL, HDL, LDLCALC, TRIG, CHOLHDL, LDLDIRECT in the last 72 hours. Thyroid Function Tests No results for input(s): TSH, T4TOTAL, T3FREE, THYROIDAB in the last 72  hours.  Invalid input(s): FREET3  Telemetry    Sinus with runs of sinus tachycardia and PVCs, intermittent LBBB - Personally Reviewed  ECG    NSR with non specific TW changes, resolved LBBB and 1st degree block - Personally Reviewed  Radiology    Dg Chest Port 1 View  Result Date: 03/16/2018 CLINICAL DATA:  Post TAVR EXAM: PORTABLE CHEST 1 VIEW COMPARISON:  Chest x-ray of 03/11/2018 and CT chest of 02/17/2017 FINDINGS: No active infiltrate or effusion is seen. Right central venous line tip overlies the mid SVC and no pneumothorax is noted. Mediastinal and hilar contours are unremarkable. Aortic valve replacement is present and cardiomegaly is stable. No bony abnormality is seen. IMPRESSION: Aortic valve replacement is present.  Stable mild cardiomegaly. Electronically Signed   By: Ivar Drape M.D.   On: 03/16/2018 11:29    Cardiac Studies   CARDIOTHORACIC SURGERY OPERATIVE NOTE  Date of Procedure:                03/16/2018  Preoperative Diagnosis:      Severe Aortic Stenosis  Procedure:        Transcatheter Aortic Valve Replacement - Transapical Approach             Edwards Sapien 3 THV (size 26 mm, model # 9600TFX, serial # [id number])              Co-Surgeons:                        Gaye Pollack, MD and Sherren Mocha, MD   Pre-operative Echo Findings: ?  severe aortic stenosis ?  normal left ventricular systolic function   Post-operative Echo Findings: ? no paravalvular leak ? normal left ventricular systolic function   ____________________   Post operative echo 03/17/18: pending today    Patient Profile      Robert Schaefer is a 77 y.o. male with a history of HTN, HLD, hypothyroidism, OSA intolerant to CPAP, COPD, prostate cancer s/p XRT, PAD s/p bilateral common iliac artery stenting, achalasia with weight loss and severe AS who presented to [facility] on 03/16/18 for planned TAVR.    Assessment & Plan    Severe AS: s/p successful TAVR with a 26 mm Edwards  Sapien 3 THV via the transapical approach on 03/16/17. Post operative echo being done now.  -- Groin site looks good. ECG with NSR and no high grade heart block. LBBB and first degree block on ECG yesterday have resolved. He has not paced at all since being in ICU. Dr. Cyndia Bent removed temp wire -- Chest wall incision site looks good. Chest  tube drained 290 ccs total and 75 ccs since last night. CXR today pending. Chest tube to be removed today. After this he can be transferred to the floor -- Continue ASA and plavix. -- Will discontinue central line and foley. Condom cath to be placed. -- He has had some agitation over night and pain in his shoulder and back being treated with morphine.   HTN: BP well controlled today, high diastolic reading which is likely spurious. Not on any antihypertensives at home. Will continue to monitor.   Hypothyroidism: continue synthroid.   Anemia: Hg stable around 9.6  COPD: stable  Signed, Angelena Form, PA-C  03/17/2018, 8:08 AM  Pager [phone]   Chart reviewed, patient examined, agree with above. His ECG this am shows resolution of LBBB and he has had a stable rhythm overnight. I removed the pacer wire and sheath. CXR ok and chest tube output low. Remove tube. Echo done and result pending.

## 2018-03-17 NOTE — Discharge Summary (Addendum)
[place] VALVE TEAM   Discharge Summary    Patient ID: Robert Schaefer,  MRN: [id number], DOB/AGE: 01-16-1941 77 y.o.  Admit date: 03/16/2018 Discharge date: 03/22/2018  Primary Care Provider: [name] Primary Cardiologist: Dr. [name] / Dr. Burt Knack & Dr. Cyndia Bent (TAVR)   Discharge Diagnoses    Principal Problem:   S/P TAVR (transcatheter aortic valve replacement) Active Problems:   Essential hypertension   GERD   Hyperlipidemia   RLS (restless legs syndrome)   Hypothyroidism   BPH w/LUTS   PVD (peripheral vascular disease) (HCC)   Esophageal stricture   Prostate cancer (HCC)   OSA (obstructive sleep apnea)   Achalasia   Severe aortic stenosis   Allergies Allergies  Allergen Reactions  . Penicillins Anaphylaxis, Itching, Swelling and Other (See Comments)    Has patient had a PCN reaction causing immediate rash, facial/tongue/throat swelling, SOB or lightheadedness with hypotension: Yes Has patient had a PCN reaction causing severe rash involving mucus membranes or skin necrosis: No Has patient had a PCN reaction that required hospitalization: Yes Has patient had a PCN reaction occurring within the last 10 years: No If all of the above answers are "NO", then may proceed with Cephalosporin use.   . Sulfa Antibiotics Anaphylaxis and Swelling  . Versed [Midazolam] Other (See Comments)    Confusion, delerium     History of Present Illness     Robert Schaefer is a 77 y.o. male with a history of HTN, HLD, hypothyroidism, OSA intolerant to CPAP, COPD, prostate cancer s/p XRT, PAD s/p bilateral common iliac artery stenting, achalasia with weight loss and severe AS who presented to [facility] on 03/16/18 for planned TAVR.   Over the past year or so the patient has had significant swallowing difficulty and was diagnosed with achalasia. He has lost about 50 pounds over the past year. He was seen by Dr. Johney Maine and was felt that he would  require surgical treatment but required cardiac clearance. He was evaluated by Dr. [name] and had an echocardiogram on 01/01/2018 which showed a severely calcified aortic valve with a mean gradient of 59 mmHg and a peak gradient of 120 mmHg. Dimensionless index was 0.23. Left ventricular ejection fraction was 60 to 65% with grade 1 diastolic dysfunction. He underwent cardiac catheterization on 01/25/2018 which showed no significant coronary disease. The aortic valve gradient was 50 mmHg. Aortic valve area was 0.55 cm. LVEDP was 23. PA pressure was 37/14 with a mean wedge pressure of 13. Bilateral iliac artery stents were patent.   He has stage D, severe, symptomatic aortic stenosis and moderate to severe aortic insufficiency with New York Heart Association class III symptoms of exertional fatigue and shortness of breath consistent with chronic diastolic congestive heart failure. He was evaluated by the multidisciplinary valve team and felt to be a suitable TAVR candidate, which was set up for 03/16/18.   Hospital Course     Consultants: none  Severe AS: s/p successful TAVR with a 26 mm Edwards Sapien 3 THV via the transapical approach on 03/16/17. Post operative echo showed a EF 60% w/ a normally functioning TAVR valve (gradients not reported). -- Groin site and chest wall incision look good. Initial ECG showed new LBBB with 1st degree AV block, which resolved and then returned. -- Continue ASA and low dose eliquis 2.5mg  BID as he had new onset PAF during admission. He will be seen back in the office for close follow up on 03/31/18.  New onset paroxysmal atrial fibrillation: the patient was noted to go into afib during his admission, which is a new diagnosis for him. He spontaneously converted back to sinus. He was started on low dose Eliquis 2.5 mg BID for thromboembolic prophlaxis. Per Dr. Cyndia Bent, this may be discontinued later if he remains in sinus long term.   HTN: BPs have been well controlled    Hypothyroidism: continue synthroid.   Chronic unspecified Anemia: Hg stable ~10.3  COPD: stable  Achalasia: soft diet ordered here. He will likely require surgery for this in near future  Dementia with delirium: he had intermittent delirium and agitation during stay.  Initially PT recommended SNF, however family now feels like they can handle him at home and would like him to go home with home health.  I will take care of these orders.  The patient has had an uncomplicated hospital course and is recovering well. The femoral catheter site and chest wall sites are stable. She has been seen by Dr. Cyndia Bent today and deemed ready for discharge home. All follow-up appointments have been scheduled. Discharge medications are listed below.  _____________  Discharge Vitals Blood pressure (!) 136/53, pulse 75, temperature 97.7 F (36.5 C), temperature source Oral, resp. rate 15, height 5\' 5"  (1.651 m), weight 60 kg, SpO2 91 %.  Filed Weights   03/20/18 0419 03/21/18 0316 03/22/18 0303  Weight: 66.5 kg 60.3 kg 60 kg    Labs & Radiologic Studies     CBC Recent Labs    03/20/18 0321 03/21/18 0338  WBC 5.2 5.3  HGB 9.2* 10.3*  HCT 27.6* 31.4*  MCV 97.9 96.9  PLT 95* 884*   Basic Metabolic Panel Recent Labs    03/20/18 0321 03/21/18 0338  NA 141 140  K 4.0 3.7  CL 104 103  CO2 32 26  GLUCOSE 130* 131*  BUN 13 12  CREATININE 0.76 0.71  CALCIUM 9.2 9.3   Liver Function Tests No results for input(s): AST, ALT, ALKPHOS, BILITOT, PROT, ALBUMIN in the last 72 hours. No results for input(s): LIPASE, AMYLASE in the last 72 hours. Cardiac Enzymes No results for input(s): CKTOTAL, CKMB, CKMBINDEX, TROPONINI in the last 72 hours. BNP Invalid input(s): POCBNP D-Dimer No results for input(s): DDIMER in the last 72 hours. Hemoglobin A1C No results for input(s): HGBA1C in the last 72 hours. Fasting Lipid Panel No results for input(s): CHOL, HDL, LDLCALC, TRIG, CHOLHDL, LDLDIRECT  in the last 72 hours. Thyroid Function Tests No results for input(s): TSH, T4TOTAL, T3FREE, THYROIDAB in the last 72 hours.  Invalid input(s): FREET3  Dg Chest 2 View  Result Date: 03/12/2018 CLINICAL DATA:  Preop aortic valve replacement. EXAM: CHEST - 2 VIEW COMPARISON:  None. FINDINGS: There is hyperinflation of the lungs compatible with COPD. Biapical scarring. No confluent opacities or effusions. Heart is normal size. IMPRESSION: COPD/chronic changes.  No active disease. Electronically Signed   By: Rolm Baptise M.D.   On: 03/12/2018 07:55   Dg Chest Port 1 View  Result Date: 03/17/2018 CLINICAL DATA:  Status post transcatheter aortic valve replacement. EXAM: PORTABLE CHEST 1 VIEW COMPARISON:  03/16/2018 FINDINGS: Sequelae of TAVR are again identified. A right jugular catheter remains in place, terminating over the mid SVC. An inferior approach pacing lead projects in the region of the right ventricle. The cardiomediastinal silhouette is unchanged with the cardiac silhouette remaining mildly enlarged. Aortic atherosclerosis is noted. The lungs are hypoinflated with mild bibasilar atelectasis. No sizable pleural effusion or pneumothorax is  identified. There is mild subcutaneous emphysema in the left lateral chest wall. IMPRESSION: 1. Sequelae of TAVR with support devices as above. 2. Hypoinflation with mild bibasilar atelectasis. Electronically Signed   By: Logan Bores M.D.   On: 03/17/2018 10:47   Dg Chest Port 1 View  Result Date: 03/16/2018 CLINICAL DATA:  Post TAVR EXAM: PORTABLE CHEST 1 VIEW COMPARISON:  Chest x-ray of 03/11/2018 and CT chest of 02/17/2017 FINDINGS: No active infiltrate or effusion is seen. Right central venous line tip overlies the mid SVC and no pneumothorax is noted. Mediastinal and hilar contours are unremarkable. Aortic valve replacement is present and cardiomegaly is stable. No bony abnormality is seen. IMPRESSION: Aortic valve replacement is present.  Stable mild  cardiomegaly. Electronically Signed   By: Ivar Drape M.D.   On: 03/16/2018 11:29     Diagnostic Studies/Procedures    CARDIOTHORACIC SURGERY OPERATIVE NOTE  Date of Procedure:03/16/2018  Preoperative Diagnosis:Severe Aortic Stenosis  Procedure:   Transcatheter Aortic Valve Replacement - Transapical Approach Edwards Sapien 3THV (size 73mm, model # 9600TFX, serial #[id number])  Co-Surgeons:Bryan Alveria Apley, MD and Sherren Mocha, MD   Pre-operative Echo Findings: ? severe aortic stenosis ? normalleft ventricular systolic function   Post-operative Echo Findings: ? noparavalvular leak ? normalleft ventricular systolic function   ____________________   Post operative echo 03/17/18 Study Conclusions - Left ventricle: The cavity size was normal. Wall thickness was   normal. Systolic function was normal. The estimated ejection   fraction was in the range of 60% to 65%. Wall motion was normal;   there were no regional wall motion abnormalities. - Aortic valve: A bioprosthesis was present.   Disposition   Pt is being discharged home today in good condition.  Follow-up Plans & Appointments    Follow-up Information    Eileen Stanford, PA-C. Go on 03/31/2018.   Specialties:  Cardiology, Radiology Why:  @ 1pm  Contact information: [place] [phone] [phone]            Discharge Medications     Medication List    TAKE these medications   albuterol 108 (90 Base) MCG/ACT inhaler Commonly known as:  PROVENTIL HFA;VENTOLIN HFA Inhale 1 puff into the lungs every 6 (six) hours as needed for wheezing or shortness of breath.   apixaban 2.5 MG Tabs tablet Commonly known as:  ELIQUIS Take 1 tablet (2.5 mg total) by mouth 2 (two) times daily.   aspirin EC 81 MG tablet Take 81 mg by mouth daily.   feeding supplement (ENSURE ENLIVE) Liqd Take 237  mLs by mouth 3 (three) times daily between meals.   fluticasone 50 MCG/ACT nasal spray Commonly known as:  FLONASE Place 2 sprays into both nostrils daily as needed for allergies or rhinitis.   levothyroxine 50 MCG tablet Commonly known as:  SYNTHROID, LEVOTHROID Take 50 mcg by mouth daily before breakfast.   UROXATRAL 10 MG 24 hr tablet Generic drug:  alfuzosin Take 10 mg by mouth daily. After lunch   Vitamin D-3 5000 units Tabs Take 5,000 Units by mouth daily at 3 pm.        Outstanding Labs/Studies   none  Duration of Discharge Encounter   Greater than 30 minutes including physician time.  Signed, Angelena Form PA-C 03/22/2018, 12:34 PM

## 2018-03-17 NOTE — Consult Note (Signed)
Spoke with floor RN, Selena, she has taken out central lines

## 2018-03-17 NOTE — Progress Notes (Signed)
Pt received to 4E13 from [place]. Pt A&Ox2;VSS. All incisions clean & dry. Pt in bed with call bell within reach. Pt bed alarm on. Tele applied; CCMD notified. Will continue to monitor.  Arnell Sieving, RN, BSN

## 2018-03-17 NOTE — Progress Notes (Signed)
Patient transferred to 4E13. Report given to [place], Therapist, sports.

## 2018-03-18 ENCOUNTER — Other Ambulatory Visit: Payer: Self-pay

## 2018-03-18 LAB — URINALYSIS, ROUTINE W REFLEX MICROSCOPIC
BACTERIA UA: NONE SEEN
BILIRUBIN URINE: NEGATIVE
GLUCOSE, UA: NEGATIVE mg/dL
KETONES UR: 5 mg/dL — AB
Leukocytes, UA: NEGATIVE
NITRITE: NEGATIVE
PROTEIN: NEGATIVE mg/dL
Specific Gravity, Urine: 1.019 (ref 1.005–1.030)
pH: 5 (ref 5.0–8.0)

## 2018-03-18 LAB — CBC
HCT: 29.8 % — ABNORMAL LOW (ref 39.0–52.0)
Hemoglobin: 9.9 g/dL — ABNORMAL LOW (ref 13.0–17.0)
MCH: 32.5 pg (ref 26.0–34.0)
MCHC: 33.2 g/dL (ref 30.0–36.0)
MCV: 97.7 fL (ref 78.0–100.0)
Platelets: 94 10*3/uL — ABNORMAL LOW (ref 150–400)
RBC: 3.05 MIL/uL — ABNORMAL LOW (ref 4.22–5.81)
RDW: 14.2 % (ref 11.5–15.5)
WBC: 8.5 10*3/uL (ref 4.0–10.5)

## 2018-03-18 LAB — BASIC METABOLIC PANEL
ANION GAP: 7 (ref 5–15)
BUN: 14 mg/dL (ref 8–23)
CALCIUM: 9.1 mg/dL (ref 8.9–10.3)
CHLORIDE: 101 mmol/L (ref 98–111)
CO2: 28 mmol/L (ref 22–32)
Creatinine, Ser: 0.82 mg/dL (ref 0.61–1.24)
GFR calc Af Amer: >60 mL/min (ref >60)
GFR calc non Af Amer: >60 mL/min (ref >60)
GLUCOSE: 134 mg/dL — AB (ref 70–99)
POTASSIUM: 4.3 mmol/L (ref 3.5–5.1)
Sodium: 136 mmol/L (ref 135–145)

## 2018-03-18 NOTE — Progress Notes (Addendum)
      [address]       [place] [address]             [phone]      2 Days Post-Op Procedure(s) (LRB): TRANSCATHETER AORTIC VALVE REPLACEMENT, TRANSAPICAL (Left)   Subjective:  Patient with no new complaints.  Developed delirium and agitation yesterday afternoon.  Per nursing had mits on at one point to avoid pulling out lines.  He has a Actuary at bedside this morning.   He is cooperative, but is not oriented.  He also states that he is sore from where is grandfather grabbed him 2 days ago to keep him from falling.   Objective: Vital signs in last 24 hours: Temp:  [97.7 F (36.5 C)-99.2 F (37.3 C)] 97.7 F (36.5 C) (09/19 0425) Pulse Rate:  [72-101] 91 (09/19 0425) Cardiac Rhythm: Heart block (09/19 0700) Resp:  [15-26] 26 (09/19 0425) BP: (124-148)/(47-110) 134/54 (09/19 0425) SpO2:  [88 %-100 %] 93 % (09/19 0425) Weight:  [62.8 kg] 62.8 kg (09/19 0425)  Intake/Output from previous day: 09/18 0701 - 09/19 0700 In: 901.6 [P.O.:627; I.V.:124.6; IV Piggyback:150] Out: 475 [Urine:425; Chest Tube:50]  General appearance: alert, cooperative and no distress Heart: regular rate and rhythm Lungs: clear to auscultation bilaterally Abdomen: soft, non-tender; bowel sounds normal; no masses,  no organomegaly Extremities: edema none Wound: clean and dry  Lab Results: Recent Labs    03/17/18 0458 03/18/18 0350  WBC 8.7 8.5  HGB 9.6* 9.9*  HCT 28.7* 29.8*  PLT 108* 94*   BMET:  Recent Labs    03/17/18 0458 03/18/18 0350  NA 137 136  K 4.4 4.3  CL 104 101  CO2 27 28  GLUCOSE 121* 134*  BUN 12 14  CREATININE 0.65 0.82  CALCIUM 9.0 9.1    PT/INR: No results for input(s): LABPROT, INR in the last 72 hours. ABG    Component Value Date/Time   PHART 7.327 (L) 03/16/2018 1117   HCO3 25.8 03/16/2018 1117   TCO2 27 03/16/2018 1117   ACIDBASEDEF 1.0 03/16/2018 1117   O2SAT 95.0 03/16/2018 1117   CBG (last 3)  No results for input(s): GLUCAP in  the last 72 hours.  Assessment/Plan: S/P Procedure(s) (LRB): TRANSCATHETER AORTIC VALVE REPLACEMENT, TRANSAPICAL (Left)  1. CV- hemodynamically stable, Echocardiogram shows good functioning AVR prosthesis 2. Neuro- new onset delirium, will stop pain medications 3. Deconditioning- patient needs to ambulate better 4. Dispo- patient with new onset delirium, not ambulating well, not ready for d/c today   LOS: 2 days    Ellwood Handler 03/18/2018   Chart reviewed, patient examined, agree with above. 2D echo reviewed and looks fine. Reportedly confused this am but seems fine to me now. He does have mild dementia.  Cardiac rehab had concerns with walking him yesterday so will ask PT to evaluate. He was ambulating independently preop but slowly.

## 2018-03-18 NOTE — Evaluation (Signed)
Physical Therapy Evaluation Patient Details Name: Robert Schaefer MRN: [id number] DOB: 1940/12/25 Today's Date: 03/18/2018   History of Present Illness  Pt is a 77 y/o male s/p TAVR. PMH includes PVD, prostate cancer, COPD, AAA, and HTN.   Clinical Impression  Pt is s/p surgery above with deficits below. Pt presenting with new onset cognitive deficits, and responding to cues for safe ambulation inconsistently. Pt requiring mod A for mobility using RW this session and required multimodal cues throughout to ensure safety with ambulation. Pt very unaware of deficits and pt's family concerned about pt going home after d/c given increased assist required. Feel pt would benefit from post acute rehab to address current deficits. Will continue to follow acutely to maximize functional mobility independence and safety.      Follow Up Recommendations SNF;Supervision/Assistance - 24 hour    Equipment Recommendations  Rolling walker with 5" wheels    Recommendations for Other Services       Precautions / Restrictions Precautions Precautions: Fall Restrictions Weight Bearing Restrictions: No      Mobility  Bed Mobility Overal bed mobility: Needs Assistance Bed Mobility: Sit to Supine;Supine to Sit     Supine to sit: Max assist Sit to supine: Mod assist   General bed mobility comments: Max A for LE assist and trunk elevation to come to sitting. Required multimodal cues for sequencing and pt slow to respond to those cues. Mod A for LE assist for return to supine.   Transfers Overall transfer level: Needs assistance Equipment used: Rolling walker (2 wheeled) Transfers: Sit to/from Stand Sit to Stand: Mod assist         General transfer comment: Mod A for lift assist and steadying. Verbal cues for safe hand placement, however, pt not responding and then required manual assist for appropriate hand placement.   Ambulation/Gait Ambulation/Gait assistance: Mod assist;+2  safety/equipment Gait Distance (Feet): 50 Feet Assistive device: Rolling walker (2 wheeled) Gait Pattern/deviations: Step-through pattern;Decreased stride length;Shuffle;Trunk flexed Gait velocity: Decreased    General Gait Details: Slow, unsteady gait. Pt with decreased proximity to device and not responding to multimodal cues to correct. Pt running into wall on the R during ambulation. Asked if pt with visual deficits, and pt reporting "I have problems with my eyes and my speech" Pt not responding to cues for upright posture during gait.   Stairs            Wheelchair Mobility    Modified Rankin (Stroke Patients Only)       Balance Overall balance assessment: Needs assistance Sitting-balance support: No upper extremity supported;Feet supported Sitting balance-Leahy Scale: Fair     Standing balance support: Bilateral upper extremity supported;During functional activity Standing balance-Leahy Scale: Poor Standing balance comment: Reliant on UE and external support.                              Pertinent Vitals/Pain Pain Assessment: Faces Faces Pain Scale: Hurts even more Pain Location: L shoulder  Pain Descriptors / Indicators: Aching;Grimacing;Guarding Pain Intervention(s): Limited activity within patient's tolerance;Monitored during session;Repositioned    Home Living Family/patient expects to be discharged to:: Private residence Living Arrangements: Spouse/significant other Available Help at Discharge: Family;Available 24 hours/day Type of Home: House Home Access: Stairs to enter Entrance Stairs-Rails: Left Entrance Stairs-Number of Steps: 3 Home Layout: One level Home Equipment: None      Prior Function Level of Independence: Needs assistance   Gait / Transfers  Assistance Needed: Reports ambulated independently  ADL's / Homemaking Assistance Needed: Wife reports pt required assist with ADL/IADL tasks.         Hand Dominance         Extremity/Trunk Assessment   Upper Extremity Assessment Upper Extremity Assessment: Generalized weakness    Lower Extremity Assessment Lower Extremity Assessment: Generalized weakness    Cervical / Trunk Assessment Cervical / Trunk Assessment: Kyphotic  Communication   Communication: Expressive difficulties(slurred speech )  Cognition Arousal/Alertness: Awake/alert Behavior During Therapy: WFL for tasks assessed/performed Overall Cognitive Status: Impaired/Different from baseline Area of Impairment: Orientation;Attention;Memory;Following commands;Safety/judgement;Awareness;Problem solving                 Orientation Level: Disoriented to;Situation;Place Current Attention Level: Sustained Memory: Decreased short-term memory Following Commands: Follows one step commands inconsistently;Follows one step commands with increased time Safety/Judgement: Decreased awareness of safety;Decreased awareness of deficits Awareness: Emergent Problem Solving: Slow processing;Difficulty sequencing;Requires verbal cues;Requires tactile cues General Comments: Pt not responding to verbal safety cues for safe use of RW. Pt stating "I don't want to fall out of the trailer" upon return to bed. Pt running into the wall with use of RW on the R and not responding to directional cues.       General Comments General comments (skin integrity, edema, etc.): Pt's daughters and wife present during session. Very concerned about pt returning home at d/c.     Exercises     Assessment/Plan    PT Assessment Patient needs continued PT services  PT Problem List Cardiopulmonary status limiting activity;Decreased safety awareness;Decreased knowledge of use of DME;Decreased cognition;Decreased knowledge of precautions;Decreased mobility;Decreased balance;Decreased strength       PT Treatment Interventions DME instruction;Gait training;Functional mobility training;Therapeutic activities;Therapeutic  exercise;Balance training;Patient/family education    PT Goals (Current goals can be found in the Care Plan section)  Acute Rehab PT Goals Patient Stated Goal: For pt to be able to walk better prior to return home per family  PT Goal Formulation: With family Time For Goal Achievement: 04/01/18 Potential to Achieve Goals: Fair    Frequency Min 2X/week   Barriers to discharge        Co-evaluation               AM-PAC PT "6 Clicks" Daily Activity  Outcome Measure Difficulty turning over in bed (including adjusting bedclothes, sheets and blankets)?: Unable Difficulty moving from lying on back to sitting on the side of the bed? : Unable Difficulty sitting down on and standing up from a chair with arms (e.g., wheelchair, bedside commode, etc,.)?: Unable Help needed moving to and from a bed to chair (including a wheelchair)?: A Lot Help needed walking in hospital room?: A Lot Help needed climbing 3-5 steps with a railing? : Total 6 Click Score: 8    End of Session Equipment Utilized During Treatment: Gait belt Activity Tolerance: Patient tolerated treatment well Patient left: in bed;with call bell/phone within reach;with bed alarm set;with nursing/sitter in room;with family/visitor present Nurse Communication: Mobility status PT Visit Diagnosis: Unsteadiness on feet (R26.81);Muscle weakness (generalized) (M62.81);Other symptoms and signs involving the nervous system (R29.898)    Time: 6160-7371 PT Time Calculation (min) (ACUTE ONLY): 27 min   Charges:   PT Evaluation $PT Eval Moderate Complexity: 1 Mod PT Treatments $Gait Training: 8-22 mins        Leighton Ruff, PT, DPT  Acute Rehabilitation Services  Pager: [phone] Office: [phone]   Rudean Hitt 03/18/2018, 3:47 PM

## 2018-03-18 NOTE — Progress Notes (Signed)
CARDIAC REHAB PHASE I   PRE:  Rate/Rhythm: 84 SR  BP:  Supine:   Sitting: 136/59  Standing:    SaO2: 96%RA  MODE:  Ambulation: 250 ft   POST:  Rate/Rhythm: 118 ST  BP:  Supine:   Sitting: 190/66 right arm, 173/69 left arm  Standing:     SaO2: 95%RA 4034-7425 Pt confused and has difficulty following directions. Pt walked 250 ft on RA with gait belt use, rolling walker and asst x 2. Followed with rollator in case needed. Pt unsteady, walks on tip toes and shuffles. When we reached recliner he had difficulty understanding he needs to sit down. Chair alarm on. Sitter in room. Keep as asst x 2.    Graylon Good, RN BSN  03/18/2018 9:29 AM

## 2018-03-19 LAB — CBC
HCT: 30 % — ABNORMAL LOW (ref 39.0–52.0)
HEMOGLOBIN: 10.1 g/dL — AB (ref 13.0–17.0)
MCH: 32.5 pg (ref 26.0–34.0)
MCHC: 33.7 g/dL (ref 30.0–36.0)
MCV: 96.5 fL (ref 78.0–100.0)
Platelets: 100 10*3/uL — ABNORMAL LOW (ref 150–400)
RBC: 3.11 MIL/uL — ABNORMAL LOW (ref 4.22–5.81)
RDW: 14.3 % (ref 11.5–15.5)
WBC: 8.6 10*3/uL (ref 4.0–10.5)

## 2018-03-19 LAB — BASIC METABOLIC PANEL
Anion gap: 9 (ref 5–15)
BUN: 15 mg/dL (ref 8–23)
CALCIUM: 9 mg/dL (ref 8.9–10.3)
CHLORIDE: 102 mmol/L (ref 98–111)
CO2: 27 mmol/L (ref 22–32)
CREATININE: 0.75 mg/dL (ref 0.61–1.24)
GFR calc Af Amer: >60 mL/min (ref >60)
GFR calc non Af Amer: >60 mL/min (ref >60)
Glucose, Bld: 134 mg/dL — ABNORMAL HIGH (ref 70–99)
Potassium: 3.9 mmol/L (ref 3.5–5.1)
Sodium: 138 mmol/L (ref 135–145)

## 2018-03-19 NOTE — Progress Notes (Signed)
3 Days Post-Op Procedure(s) (LRB): TRANSCATHETER AORTIC VALVE REPLACEMENT, TRANSAPICAL (Left) Subjective: Wants to go home. Pain well-controlled  Objective: Vital signs in last 24 hours: Temp:  [97.5 F (36.4 C)-100.1 F (37.8 C)] 97.5 F (36.4 C) (09/20 0545) Pulse Rate:  [80-109] 80 (09/20 0545) Cardiac Rhythm: Atrial fibrillation;Bundle branch block (09/20 0545) Resp:  [18-26] 24 (09/20 0545) BP: (123-161)/(48-91) 127/61 (09/20 0545) SpO2:  [89 %-97 %] 97 % (09/20 0545) Weight:  [63.6 kg] 63.6 kg (09/20 0500)  Hemodynamic parameters for last 24 hours:    Intake/Output from previous day: 09/19 0701 - 09/20 0700 In: 1143 [P.O.:1140; I.V.:3] Out: -  Intake/Output this shift: No intake/output data recorded.  General appearance: alert and cooperative Neurologic: intact Heart: irregularly irregular rhythm Lungs: clear to auscultation bilaterally Extremities: extremities normal, atraumatic, no cyanosis or edema Wound: incision ok  Lab Results: Recent Labs    03/17/18 0458 03/18/18 0350  WBC 8.7 8.5  HGB 9.6* 9.9*  HCT 28.7* 29.8*  PLT 108* 94*   BMET:  Recent Labs    03/17/18 0458 03/18/18 0350  NA 137 136  K 4.4 4.3  CL 104 101  CO2 27 28  GLUCOSE 121* 134*  BUN 12 14  CREATININE 0.65 0.82  CALCIUM 9.0 9.1    PT/INR: No results for input(s): LABPROT, INR in the last 72 hours. ABG    Component Value Date/Time   PHART 7.327 (L) 03/16/2018 1117   HCO3 25.8 03/16/2018 1117   TCO2 27 03/16/2018 1117   ACIDBASEDEF 1.0 03/16/2018 1117   O2SAT 95.0 03/16/2018 1117   CBG (last 3)  No results for input(s): GLUCAP in the last 72 hours.  Assessment/Plan: S/P Procedure(s) (LRB): TRANSCATHETER AORTIC VALVE REPLACEMENT, TRANSAPICAL (Left)  POD 3 He has been hemodynamically stable.  Developed postop atrial fibrillation with controlled rate overnight and is still in atrial fib with rate of 80's this am. Would not use amio in this pt with LBBB and controlled  rate due to increased risk of high degree heart block. May plan to anticoagulate probably with Eliquis 2.5 bid if he stays in this today although I am concerned about risk of falls. He had been on ASA and Plavix prior to surgery for vascular disease and stents.   He was evaluated by PT yesterday and he needs continued improvement before he could go home. They recommended SNF but we will see how he does over the weekend before making that decision.   Hopefully PT and cardiac rehab will continue to ambulate him so that he improves. The longer he sits the harder it is going to be.   LOS: 3 days    Gaye Pollack 03/19/2018

## 2018-03-19 NOTE — Progress Notes (Signed)
@  approx. 0220 pt observed to have changed rhythms from NSR to Afib. Pt assessed, VSS, and pleasantly confused. Rhythm confirmed by 12 Lead EKG and on-call cardiologist paged (@0248 ). Page returned @0249  (Dr. Rutherford Lions) and no new orders received. Per review of pt's telemetry, onset of Afib began at approx. 01:16:31. Will continue to monitor and convey to Day Team for additional intervention.

## 2018-03-19 NOTE — Progress Notes (Signed)
Physical Therapy Treatment Patient Details Name: Robert Schaefer MRN: [id number] DOB: September 26, 1940 Today's Date: 03/19/2018    History of Present Illness Pt is a 77 y/o male s/p TAVR. PMH includes PVD, prostate cancer, COPD, AAA, and HTN.     PT Comments    Pt progressing towards goals, however, limited secondary to fatigue. Pt continues to present with cognitive deficits and required continuous safety cues for safe use of RW, however, pt not consistently responding to cues. Required mod A for mobility with RW. Per RN, pt's family reports they would prefer to take pt home if pt progresses. Continue to recommend SNF at d/c given current mobility and cognitive deficits. Will continue to follow acutely to maximize functional mobility independence and safety.    Follow Up Recommendations  SNF;Supervision/Assistance - 24 hour     Equipment Recommendations  Rolling walker with 5" wheels    Recommendations for Other Services       Precautions / Restrictions Precautions Precautions: Fall Restrictions Weight Bearing Restrictions: No    Mobility  Bed Mobility Overal bed mobility: Needs Assistance Bed Mobility: Sit to Supine;Supine to Sit     Supine to sit: Mod assist Sit to supine: Min assist   General bed mobility comments: Mod A for trunk elevation. Improved sequencing noted with LEs. Min A for LE lift assist for return to supine.   Transfers Overall transfer level: Needs assistance Equipment used: Rolling walker (2 wheeled) Transfers: Sit to/from Stand Sit to Stand: Mod assist         General transfer comment: Mod A for lift assist and steadying. Verbal cues for safe hand placement, however, pt still not responding and required manual assist for appropriate hand placement on RW.   Ambulation/Gait Ambulation/Gait assistance: Mod assist Gait Distance (Feet): 50 Feet Assistive device: Rolling walker (2 wheeled) Gait Pattern/deviations: Step-through pattern;Decreased stride  length;Shuffle;Trunk flexed Gait velocity: Decreased    General Gait Details: Continues to present with slow, unsteady gait. Pt not responding to manual/verbal cues for proximity to device. Pt outside of RW when turning around and reaching for middle of RW, instead of maintaining appropriate hand placement. Did not respond to cues for appropriate use of RW during turns. Distance limited secondary to fatigue.    Stairs             Wheelchair Mobility    Modified Rankin (Stroke Patients Only)       Balance Overall balance assessment: Needs assistance Sitting-balance support: No upper extremity supported;Feet supported Sitting balance-Leahy Scale: Fair     Standing balance support: Bilateral upper extremity supported;During functional activity Standing balance-Leahy Scale: Poor Standing balance comment: Reliant on UE and external support.                             Cognition Arousal/Alertness: Awake/alert Behavior During Therapy: WFL for tasks assessed/performed Overall Cognitive Status: Impaired/Different from baseline Area of Impairment: Attention;Memory;Following commands;Safety/judgement;Awareness;Problem solving                   Current Attention Level: Sustained Memory: Decreased short-term memory Following Commands: Follows one step commands inconsistently;Follows one step commands with increased time Safety/Judgement: Decreased awareness of safety;Decreased awareness of deficits Awareness: Emergent Problem Solving: Slow processing;Difficulty sequencing;Requires verbal cues;Requires tactile cues General Comments: Pt oriented this session, however, continues to presenting with difficulty sequencing and demonstrates delayed responses.       Exercises      General Comments General comments (  skin integrity, edema, etc.): Per RN, pt family reports they would prefer to take pt home if pt progresses well.       Pertinent Vitals/Pain Pain  Assessment: Faces Faces Pain Scale: Hurts little more Pain Location: L shoulder  Pain Descriptors / Indicators: Aching;Grimacing;Guarding Pain Intervention(s): Monitored during session;Limited activity within patient's tolerance;Repositioned    Home Living                      Prior Function            PT Goals (current goals can now be found in the care plan section) Acute Rehab PT Goals Patient Stated Goal: For pt to be able to walk better prior to return home per family  PT Goal Formulation: With family Time For Goal Achievement: 04/01/18 Potential to Achieve Goals: Fair Progress towards PT goals: Progressing toward goals    Frequency    Min 2X/week      PT Plan Current plan remains appropriate    Co-evaluation              AM-PAC PT "6 Clicks" Daily Activity  Outcome Measure  Difficulty turning over in bed (including adjusting bedclothes, sheets and blankets)?: Unable Difficulty moving from lying on back to sitting on the side of the bed? : Unable Difficulty sitting down on and standing up from a chair with arms (e.g., wheelchair, bedside commode, etc,.)?: Unable Help needed moving to and from a bed to chair (including a wheelchair)?: A Lot Help needed walking in hospital room?: A Lot Help needed climbing 3-5 steps with a railing? : Total 6 Click Score: 8    End of Session Equipment Utilized During Treatment: Gait belt Activity Tolerance: Patient tolerated treatment well Patient left: in bed;with call bell/phone within reach;with bed alarm set Nurse Communication: Mobility status PT Visit Diagnosis: Unsteadiness on feet (R26.81);Muscle weakness (generalized) (M62.81);Other symptoms and signs involving the nervous system (R29.898)     Time: 1255-1311 PT Time Calculation (min) (ACUTE ONLY): 16 min  Charges:  $Gait Training: 8-22 mins                     Leighton Ruff, PT, DPT  Acute Rehabilitation Services  Pager: [phone] Office: [phone]    Rudean Hitt 03/19/2018, 2:16 PM

## 2018-03-19 NOTE — Plan of Care (Signed)

## 2018-03-19 NOTE — Consult Note (Signed)
[facility] CM Primary Care Navigator  03/19/2018  Robert Schaefer Mar 02, 1941 [id number]   Met with patient, wife Robert Schaefer), daughter Robert Schaefer) and other family members at the bedside to identify possible discharge needs.  Per MD note, patient presented to [facility] for planned TAVR- (transcatheter aortic valve replacement). Daughter states that patient had been having chest pains, shortness of breath and difficulty swallowing. (severe aortic stenosis status post transcatheter aortic valve replacement, achalasia, new onset atrial fibrillation)  Patient endorses Dr.William Melford Schaefer with [facility] as theprimary care provider.   Patient's wife shared using [facility] [address] [place] to obtain medications without any problem.  Wife states that patient has been managing hismedications with her assistance using "pill box" system filled weekly. Although patient can be stubborn in taking his medications at times, per wife.  Patient's daughter has beendrivingand providing transportation to his doctors' appointments.  Patient's wife is the primary caregiver for patient at home.  Disposition for discharge isstillto be determined per MD note. SNF- skilled nursing facility has been recommended per therapy for rehab.  Patient and family voiced understanding to call primary care provider's office whenhe returns to home, for a post discharge follow-up appointment within 1- 2 weeks or sooner if needs arise.Patient letter (with PCP's contact number) was provided as a reminder.  Explained to patient and family about [facility] CM services available for healthmanagement and resources at home but still unsure of patient's discharge disposition per daughters.  Patient and family expressed understandingto seekreferral to [facility] care managementfrom primary care providerif deemed necessary and appropriate foranyservices in thenear  future.  [facility] care management information provided for future needs that patient may have.   Primary care provider's office is listed as providing transition of care (TOC) follow-up.   For additional questions please contact:  Edwena Felty A. Aidah Forquer, BSN, RN-BC [facility] PRIMARY CARE Navigator Cell: [phone]

## 2018-03-19 NOTE — Progress Notes (Signed)
CARDIAC REHAB PHASE I   PRE:  Rate/Rhythm: 91 afib  BP:  Supine:   Sitting: 121/76  Standing:    SaO2: 96%RA  MODE:  Ambulation: 370 ft   POST:  Rate/Rhythm: 124 afib peak   100 afib in room  BP:  Supine:   Sitting: 171/72  Standing:    SaO2: 96%RA(   91%RA hall when sitting) 0850-0930 Pt walked 370 ft on RA with gait belt use, rolling walker and asst x 2. Pt encouraged to stand upright and to stay close to walker. Not shuffling as much today. Pt had to sit once to rest due to SOB. Sats at 91% and HR at 124 a fib.  Pt did not know he was at [facility]. He thought he was at [facility]. To recliner with chair alarm and call bell. Pt coughed up productively after walk.    Graylon Good, RN BSN  03/19/2018 9:25 AM

## 2018-03-19 NOTE — Care Management Important Message (Signed)
Important Message  Patient Details  Name: Robert Schaefer MRN: [id number] Date of Birth: 08-19-1940   Medicare Important Message Given:  Yes    Barb Merino [place] 03/19/2018, 2:58 PM

## 2018-03-20 LAB — BASIC METABOLIC PANEL
ANION GAP: 5 (ref 5–15)
BUN: 13 mg/dL (ref 8–23)
CHLORIDE: 104 mmol/L (ref 98–111)
CO2: 32 mmol/L (ref 22–32)
Calcium: 9.2 mg/dL (ref 8.9–10.3)
Creatinine, Ser: 0.76 mg/dL (ref 0.61–1.24)
GFR calc Af Amer: >60 mL/min (ref >60)
GFR calc non Af Amer: >60 mL/min (ref >60)
GLUCOSE: 130 mg/dL — AB (ref 70–99)
POTASSIUM: 4 mmol/L (ref 3.5–5.1)
Sodium: 141 mmol/L (ref 135–145)

## 2018-03-20 LAB — CBC
HEMATOCRIT: 27.6 % — AB (ref 39.0–52.0)
Hemoglobin: 9.2 g/dL — ABNORMAL LOW (ref 13.0–17.0)
MCH: 32.6 pg (ref 26.0–34.0)
MCHC: 33.3 g/dL (ref 30.0–36.0)
MCV: 97.9 fL (ref 78.0–100.0)
Platelets: 95 10*3/uL — ABNORMAL LOW (ref 150–400)
RBC: 2.82 MIL/uL — ABNORMAL LOW (ref 4.22–5.81)
RDW: 14.3 % (ref 11.5–15.5)
WBC: 5.2 10*3/uL (ref 4.0–10.5)

## 2018-03-20 MED ORDER — POLYETHYLENE GLYCOL 3350 17 G PO PACK
17.0000 g | PACK | Freq: Every day | ORAL | Status: DC
  Administered 2018-03-20 – 2018-03-22 (×3): 17 g via ORAL
  Filled 2018-03-20 (×3): qty 1

## 2018-03-20 NOTE — Progress Notes (Signed)
CARDIAC REHAB PHASE I   PRE:  Rate/Rhythm: 80 afib    BP: sitting 143/69    SaO2: 96 RA  MODE:  Ambulation: 120 ft   POST:  Rate/Rhythm: 102 afib    BP: sitting 154/79     SaO2: 93 RA   Pt eager to walk. Pt very slow getting to EOB but was eventually able to get there independently with verbal cues. Slow to process what step is next. Stood with min assist with gait belt. Once up, pt needed max cues of feet placement. I tried to instruct him to have his feet inside RW, parallel with legs of RW. He actually stepped away from [place] when I asked him to step forward. This happened x2. During walk he leans over RW and keeps feet further back than he should. Small steps but he did seem to tolerate better and was steadier for most of the walk. Tired toward end. To recliner on chair alarm. Pt pleasant, wanting to watch the panthers.  [place], ACSM 03/20/2018 3:21 PM

## 2018-03-20 NOTE — Progress Notes (Addendum)
      [address]       [place] [address]             [phone]      4 Days Post-Op Procedure(s) (LRB): TRANSCATHETER AORTIC VALVE REPLACEMENT, TRANSAPICAL (Left)   Subjective:  No specific complaints.  States he doesn't know how is doing anymore.  Per nursing assistance, attempted to walk to the bathroom, but patient was unstable on feet and they were unable to complete.  Objective: Vital signs in last 24 hours: Temp:  [97.9 F (36.6 C)-99.4 F (37.4 C)] 98.3 F (36.8 C) (09/21 0419) Pulse Rate:  [74-83] 74 (09/21 0419) Cardiac Rhythm: Normal sinus rhythm;Bundle branch block;Heart block (09/21 0700) Resp:  [19-24] 19 (09/21 0419) BP: (122-135)/(54-58) 122/56 (09/21 0419) SpO2:  [95 %-97 %] 95 % (09/21 0419) Weight:  [66.5 kg] 66.5 kg (09/21 0419)  General appearance: alert, cooperative and no distress Heart: regular rate and rhythm Lungs: clear to auscultation bilaterally Abdomen: soft, non-tender; bowel sounds normal; no masses,  no organomegaly Extremities: extremities normal, atraumatic, no cyanosis or edema Wound: clean and dry  Lab Results: Recent Labs    03/19/18 0728 03/20/18 0321  WBC 8.6 5.2  HGB 10.1* 9.2*  HCT 30.0* 27.6*  PLT 100* 95*   BMET:  Recent Labs    03/19/18 0728 03/20/18 0321  NA 138 141  K 3.9 4.0  CL 102 104  CO2 27 32  GLUCOSE 134* 130*  BUN 15 13  CREATININE 0.75 0.76  CALCIUM 9.0 9.2    PT/INR: No results for input(s): LABPROT, INR in the last 72 hours. ABG    Component Value Date/Time   PHART 7.327 (L) 03/16/2018 1117   HCO3 25.8 03/16/2018 1117   TCO2 27 03/16/2018 1117   ACIDBASEDEF 1.0 03/16/2018 1117   O2SAT 95.0 03/16/2018 1117   CBG (last 3)  No results for input(s): GLUCAP in the last 72 hours.  Assessment/Plan: S/P Procedure(s) (LRB): TRANSCATHETER AORTIC VALVE REPLACEMENT, TRANSAPICAL (Left)  1. CV- PAF, converted to NSR around 1300 yesterday, has been maintaining ever since-if  patient converts back into Atrial Fibrillation will require Eliquis, no Amiodarone per Dr. Cyndia Bent. 2. Pulm- no acute issues, continue IS 3. Deconditioning- patient does not walk well prior to surgery per family, they are not comfortable taking patient home, PT evaluation recommends SNF placement, however patient wants to go home.  Will continue PT evaluations over the weekend, walking with staff and reassess plan for d/c on Monday    LOS: 4 days    Ellwood Handler 03/20/2018  Waiting decision about placement  I have seen and examined Yehuda Budd and agree with the above assessment  and plan.  Grace Isaac MD Beeper [phone] Office [phone] 03/20/2018 11:50 AM

## 2018-03-21 LAB — BASIC METABOLIC PANEL
Anion gap: 11 (ref 5–15)
BUN: 12 mg/dL (ref 8–23)
CHLORIDE: 103 mmol/L (ref 98–111)
CO2: 26 mmol/L (ref 22–32)
CREATININE: 0.71 mg/dL (ref 0.61–1.24)
Calcium: 9.3 mg/dL (ref 8.9–10.3)
GFR calc Af Amer: >60 mL/min (ref >60)
GFR calc non Af Amer: >60 mL/min (ref >60)
Glucose, Bld: 131 mg/dL — ABNORMAL HIGH (ref 70–99)
Potassium: 3.7 mmol/L (ref 3.5–5.1)
Sodium: 140 mmol/L (ref 135–145)

## 2018-03-21 LAB — CBC
HCT: 31.4 % — ABNORMAL LOW (ref 39.0–52.0)
HEMOGLOBIN: 10.3 g/dL — AB (ref 13.0–17.0)
MCH: 31.8 pg (ref 26.0–34.0)
MCHC: 32.8 g/dL (ref 30.0–36.0)
MCV: 96.9 fL (ref 78.0–100.0)
Platelets: 104 10*3/uL — ABNORMAL LOW (ref 150–400)
RBC: 3.24 MIL/uL — ABNORMAL LOW (ref 4.22–5.81)
RDW: 14 % (ref 11.5–15.5)
WBC: 5.3 10*3/uL (ref 4.0–10.5)

## 2018-03-21 MED ORDER — APIXABAN 2.5 MG PO TABS
2.5000 mg | ORAL_TABLET | Freq: Two times a day (BID) | ORAL | Status: DC
  Administered 2018-03-21 – 2018-03-22 (×3): 2.5 mg via ORAL
  Filled 2018-03-21 (×3): qty 1

## 2018-03-21 NOTE — Progress Notes (Signed)
Pt ambulated in hallway with rolling walker and tolerated fine. Ambulated approximately 100 feet. Pt resting with call bell within reach.  Will continue to monitor. NT Megan at bedside for safety.

## 2018-03-21 NOTE — Progress Notes (Addendum)
ANTICOAGULATION CONSULT NOTE - Initial Consult  Pharmacy Consult for apixaban Indication: atrial fibrillation  Allergies  Allergen Reactions  . Penicillins Anaphylaxis, Itching, Swelling and Other (See Comments)    Has patient had a PCN reaction causing immediate rash, facial/tongue/throat swelling, SOB or lightheadedness with hypotension: Yes Has patient had a PCN reaction causing severe rash involving mucus membranes or skin necrosis: No Has patient had a PCN reaction that required hospitalization: Yes Has patient had a PCN reaction occurring within the last 10 years: No If all of the above answers are "NO", then may proceed with Cephalosporin use.   . Sulfa Antibiotics Anaphylaxis and Swelling  . Versed [Midazolam] Other (See Comments)    Confusion, delerium    Patient Measurements: Height: 5\' 5"  (165.1 cm) Weight: 132 lb 14.4 oz (60.3 kg) IBW/kg (Calculated) : 61.5  Vital Signs: Temp: 98.3 F (36.8 C) (09/22 0748) Temp Source: Oral (09/22 0748) BP: 148/78 (09/22 0748) Pulse Rate: 73 (09/22 0310)  Labs: Recent Labs    03/19/18 0728 03/20/18 0321 03/21/18 0338  HGB 10.1* 9.2* 10.3*  HCT 30.0* 27.6* 31.4*  PLT 100* 95* 104*  CREATININE 0.75 0.76 0.71    Estimated Creatinine Clearance: 64.9 mL/min (by C-G formula based on SCr of 0.71 mg/dL).   Medical History: Past Medical History:  Diagnosis Date  . AAA (abdominal aortic aneurysm) ([place])   . Achalasia   . Carotid stenosis, asymptomatic, bilateral    per duplex --- bilateral <50% ICA  . COPD (chronic obstructive pulmonary disease) ([place])    pulmologist-  dr [name]  . GERD (gastroesophageal reflux disease)   . Hyperlipidemia   . Hypertension   . Hypothyroidism   . Lesion of left lung    followed by dr [name] (pulmologist)  post infection scarring  . Mild dementia   . OSA (obstructive sleep apnea)    has a cpap-  intolerant; due to Claustaphobia (01/25/2018)  . Peripheral vascular disease [facility])    cardiologist-- dr berry-- per last ABI/Aorta/Iliac 06-09-2016  patent bilateral iliac kissing stents, >50% stenosis bilateral iliac artery (R>L) and left external iliac artery, patent IVC  . Prostate cancer [facility])    urologist-  dr [name] oncologist-  dr [name]. Dx'd 2018 s/p XRT  . RLS (restless legs syndrome)   . S/P TAVR (transcatheter aortic valve replacement)    s/p TAVR via transapical approach with an Edwards Sapien 3 THV (size 26 mm, model # 9600TFX, serial # [id number])  . Schatzki's ring   . Severe aortic stenosis    Assessment: Robert Schaefer is a 77y.o. Male who is now s/p TAVR 9/17. Pharmacy consulted for apixaban dosing for post-operative atrial fibrillation.   Patient meets criteria for apixaban 5mg  dosing (Scr <1.5, age <63, TBW >60kg). Discussed with PA Barrett, patient is confused and times and not ambulating well, concern for potential falls; but wanting to anticoagulate given high risk for stroke. Will give reduced dose due to these concerns. CBC stable/improving  Plan:  Will start apixaban 2.5mg  BID Continue to monitor renal function Monitor improvements of mentation, ambulation for increases in dose  Thank you for involving pharmacy in this patient's care.  Janae Bridgeman, PharmD PGY1 Pharmacy Resident Phone: [phone] 03/21/2018 8:49 AM

## 2018-03-21 NOTE — Progress Notes (Addendum)
      [address]       [place] [address]             [phone]      5 Days Post-Op Procedure(s) (LRB): TRANSCATHETER AORTIC VALVE REPLACEMENT, TRANSAPICAL (Left)   Subjective:  Patient is confused this morning.  He is getting up and out of bed without assistance.  He is pleasant for me.  Objective: Vital signs in last 24 hours: Temp:  [98 F (36.7 C)-98.4 F (36.9 C)] 98.4 F (36.9 C) (09/22 0310) Pulse Rate:  [69-77] 73 (09/22 0310) Cardiac Rhythm: Normal sinus rhythm;Atrial fibrillation (09/22 0744) Resp:  [19-20] 20 (09/22 0310) BP: (122-151)/(64-70) 151/70 (09/22 0310) SpO2:  [94 %-97 %] 94 % (09/22 0310) Weight:  [60.3 kg] 60.3 kg (09/22 0316)  Intake/Output from previous day: 09/21 0701 - 09/22 0700 In: 840 [P.O.:840] Out: 2050 [Urine:2050]  General appearance: alert, cooperative and no distress Heart: regular rate and rhythm Lungs: clear to auscultation bilaterally Abdomen: soft, non-tender; bowel sounds normal; no masses,  no organomegaly Extremities: extremities normal, atraumatic, no cyanosis or edema Wound: clean and dry  Lab Results: Recent Labs    03/20/18 0321 03/21/18 0338  WBC 5.2 5.3  HGB 9.2* 10.3*  HCT 27.6* 31.4*  PLT 95* 104*   BMET:  Recent Labs    03/20/18 0321 03/21/18 0338  NA 141 140  K 4.0 3.7  CL 104 103  CO2 32 26  GLUCOSE 130* 131*  BUN 13 12  CREATININE 0.76 0.71  CALCIUM 9.2 9.3    PT/INR: No results for input(s): LABPROT, INR in the last 72 hours. ABG    Component Value Date/Time   PHART 7.327 (L) 03/16/2018 1117   HCO3 25.8 03/16/2018 1117   TCO2 27 03/16/2018 1117   ACIDBASEDEF 1.0 03/16/2018 1117   O2SAT 95.0 03/16/2018 1117   CBG (last 3)  No results for input(s): GLUCAP in the last 72 hours.  Assessment/Plan: S/P Procedure(s) (LRB): TRANSCATHETER AORTIC VALVE REPLACEMENT, TRANSAPICAL (Left)  1. CV- Brief episode of Atrial Fibrillation, currently in NSR- he is at high risk for a  stroke, will start low dose Eliquis 2. Pulm- no acute issues, continue IS 3. Renal- creatinine WNL, Hgb stable 4. Expected Thrombocytopenia, at 104 5. Deconditioning- patient doesn't ambulate well, SNF recommended by PT 6. Dispo- patient with continued confusion at times, he does have mild dementia, he was pleasant and cooperative on exam, will start low dose Eliquis for repeated episodes of Atrial Fibrillation, continue current care possibly to SNF tomorrow vs Home with Home PT, depending on family support   LOS: 5 days    Ellwood Handler 03/21/2018  Patient remains confused  In afib- start Eliquis low dose , discussed with son in room today will need snf placement asap I have seen and examined Robert Schaefer and agree with the above assessment  and plan.  Grace Isaac MD Beeper [phone] Office [phone] 03/21/2018 11:19 AM

## 2018-03-21 NOTE — Discharge Instructions (Addendum)
ACTIVITY AND EXERCISE °• Daily activity and exercise are an important part of your recovery. People recover at different rates depending on their general health and type of valve procedure. °• Most people recovering from TAVR feel better relatively quickly  °• No lifting, pushing, pulling more than 10 pounds (examples to avoid: groceries, vacuuming, gardening, golfing): °            - For one week with a procedure through the groin. °            - For six weeks for procedures through the chest wall or neck °NOTE: You will typically see one of our providers 7-14 days after your procedure to discuss WHEN TO RESUME the above activities.  °  °  °DRIVING °• Do not drive for until you are seen for follow up and cleared by a provider. Generally, we ask patient to not drive for 1 week after their procedure. °• If you have been told by your doctor in the past that you may not drive, you must talk with him/her before you begin driving again. °  °  °DRESSING °• Groin site: you may leave the clear dressing over the site for up to one week or until it falls off. °  °  °HYGIENE °• If you had a femoral (leg) procedure, you may take a shower when you return home. After the shower, pat the site dry. Do NOT use powder, oils or lotions in your groin area until the site has completely healed. °• If you had a chest procedure, you may shower when you return home unless specifically instructed not to by your discharging practitioner. °            - DO NOT scrub incision; pat dry with a towel °            - DO NOT apply any lotions, oils, powders to the incision °            - No tub baths / swimming for at least 2 weeks. °• If you notice any fevers, chills, increased pain, swelling, bleeding or pus, please contact your doctor. °  °ADDITIONAL INFORMATION °• If you are going to have an upcoming dental procedure, please contact our office as you will require antibiotics ahead of time to prevent infection on your heart valve.  ° ° °If you  have any questions or concerns you can call the structural heart phone during normal business hours 8am-4pm. If you have an urgent need after hours or weekends please call [phone] to talk to the on call provider for general cardiology. If you have an emergency that requires immediate attention, please call 911.  ° ° °After TAVR Checklist ° °Check  Test Description  ° Follow up appointment in 1-2 weeks  Most of our patients will see our structural heart physician assistant, Katie Bryston Colocho. Your incision sites will be checked and you will be cleared to drive and resume all normal activities if you are doing well.    ° 1 month echo and follow up  You will have an echo to check on your new heart valve and be seen back in the office by Katie Ander Wamser. Many times the echo is not read by your appointment time, but Katie will call you later that day or the following day to report your results.  ° Follow up with your primary cardiologist You will need to be seen by your primary cardiologist in the following 3-6 months   after your 1 month appointment in the valve clinic. Often times your Plavix or Aspirin will be discontinued during this time, but this is decided on a case by case basis.    1 year echo and follow up You will have another echo to check on your heart valve after 1 year and be seen back in the office by Nell Range. This your last structural heart visit.   Bacterial endocarditis prophylaxis  You will have to take antibiotics for the rest of your life before all dental procedures (even teeth cleanings) to protect your heart valve. Antibiotics are also required before some surgeries. Please check with your cardiologist before scheduling any surgeries. Also, please make sure to tell us if you have a penicillin allergy as you will require an alternative antibiotic.     Information on my medicine - ELIQUIS (apixaban)  This medication education was reviewed with me or my healthcare representative as  part of my discharge preparation.   Why was Eliquis prescribed for you? Eliquis was prescribed for you to reduce the risk of a blood clot forming that can cause a stroke if you have a medical condition called atrial fibrillation (a type of irregular heartbeat).  What do You need to know about Eliquis ? Take your Eliquis TWICE DAILY - one tablet in the morning and one tablet in the evening with or without food. If you have difficulty swallowing the tablet whole please discuss with your pharmacist how to take the medication safely.  Take Eliquis exactly as prescribed by your doctor and DO NOT stop taking Eliquis without talking to the doctor who prescribed the medication.  Stopping may increase your risk of developing a stroke.  Refill your prescription before you run out.  After discharge, you should have regular check-up appointments with your healthcare provider that is prescribing your Eliquis.  In the future your dose may need to be changed if your kidney function or weight changes by a significant amount or as you get older.  What do you do if you miss a dose? If you miss a dose, take it as soon as you remember on the same day and resume taking twice daily.  Do not take more than one dose of ELIQUIS at the same time to make up a missed dose.  Important Safety Information A possible side effect of Eliquis is bleeding. You should call your healthcare provider right away if you experience any of the following: ? Bleeding from an injury or your nose that does not stop. ? Unusual colored urine (red or dark brown) or unusual colored stools (red or black). ? Unusual bruising for unknown reasons. ? A serious fall or if you hit your head (even if there is no bleeding).  Some medicines may interact with Eliquis and might increase your risk of bleeding or clotting while on Eliquis. To help avoid this, consult your healthcare provider or pharmacist prior to using any new prescription or  non-prescription medications, including herbals, vitamins, non-steroidal anti-inflammatory drugs (NSAIDs) and supplements.  This website has more information on Eliquis (apixaban): http://www.eliquis.com/eliquis/home

## 2018-03-21 NOTE — Progress Notes (Signed)
Pt confused, seeing wife in corner and talking to her. Pt says his bifocals are messed up and that is why he is seeing double. Called patient's wife to come and sit with him for safety. Pt currently with NT at bedside for safety with floor mats and bed alarm. Pt is pleasant. Payton Emerald, RN

## 2018-03-21 NOTE — NC FL2 (Signed)
[place] MEDICAID FL2 LEVEL OF CARE SCREENING TOOL     IDENTIFICATION  Patient Name: Robert Schaefer Birthdate: [birth year] Sex: male Admission Date (Current Location): 03/16/2018  [facility] and [place] Number:  Herbalist and Address:  [facility] [place] [address] [place], [place] [address]      Provider Number: [id number]  Attending Physician Name and Address:  Gaye Pollack, MD  Relative Name and Phone Number:  Robert Schaefer, spouse, [phone]    Current Level of Care: Hospital Recommended Level of Care: [place] Prior Approval Number:    Date Approved/Denied:   PASRR Number: [id number]  Discharge Plan: SNF    Current Diagnoses: Patient Active Problem List   Diagnosis Date Noted  . S/P TAVR (transcatheter aortic valve replacement) 03/16/2018  . Prostate cancer ([place])   . OSA (obstructive sleep apnea)   . Achalasia   . Severe aortic stenosis   . Esophageal stricture   . Cavitating mass in left upper lung lobe 08/12/2016  . PVD (peripheral vascular disease) ([place]) 05/31/2016  . Hypothyroidism 11/07/2014  . BPH w/LUTS 11/07/2014  . Medication management 04/11/2014  . Hyperlipidemia   . RLS (restless legs syndrome)   . Essential hypertension 09/28/2009  . GERD 09/28/2009    Orientation RESPIRATION BLADDER Height & Weight     Self  Normal Incontinent, External catheter Weight: 132 lb 14.4 oz (60.3 kg) Height:  5\' 5"  (165.1 cm)  BEHAVIORAL SYMPTOMS/MOOD NEUROLOGICAL BOWEL NUTRITION STATUS      Continent Diet(please see DC summary)  AMBULATORY STATUS COMMUNICATION OF NEEDS Skin   Limited Assist Verbally Surgical wounds(closed incisions chest and R groin)                       Personal Care Assistance Level of Assistance  Bathing, Feeding, Dressing Bathing Assistance: Limited assistance Feeding assistance: Independent Dressing Assistance: Limited assistance     Functional Limitations Info            SPECIAL CARE FACTORS FREQUENCY  PT (By licensed PT), OT (By licensed OT)     PT Frequency: 5x/week OT Frequency: 5x/week            Contractures Contractures Info: Not present    Additional Factors Info  Code Status, Allergies Code Status Info: Full Allergies Info: Penicillins, Sulfa Antibiotics, Versed Midazolam           Current Medications (03/21/2018):  This is the current hospital active medication list Current Facility-Administered Medications  Medication Dose Route Frequency Provider Last Rate Last Dose  . 0.9 %  sodium chloride infusion  250 mL Intravenous PRN Eileen Stanford, PA-C 10 mL/hr at 03/17/18 1300    . acetaminophen (TYLENOL) tablet 650 mg  650 mg Oral Q6H PRN Eileen Stanford, PA-C   650 mg at 03/18/18 2126   Or  . acetaminophen (TYLENOL) suppository 650 mg  650 mg Rectal Q6H PRN Eileen Stanford, PA-C      . albuterol (PROVENTIL) (2.5 MG/3ML) 0.083% nebulizer solution 3 mL  3 mL Nebulization Q6H PRN Eileen Stanford, PA-C   3 mL at 03/21/18 0804  . apixaban (ELIQUIS) tablet 2.5 mg  2.5 mg Oral BID Barrett, Erin R, PA-C   2.5 mg at 03/21/18 1053  . aspirin chewable tablet 81 mg  81 mg Oral Daily Eileen Stanford, PA-C   81 mg at 03/21/18 1053  . clopidogrel (PLAVIX) tablet 75 mg  75 mg  Oral Q breakfast Eileen Stanford, PA-C   75 mg at 03/21/18 0804  . fluticasone (FLONASE) 50 MCG/ACT nasal spray 2 spray  2 spray Each Nare Daily PRN Eileen Stanford, PA-C      . hydrALAZINE (APRESOLINE) injection 10 mg  10 mg Intravenous PRN Eileen Stanford, PA-C      . levothyroxine (SYNTHROID, LEVOTHROID) tablet 50 mcg  50 mcg Oral QAC breakfast Eileen Stanford, PA-C   50 mcg at 03/21/18 0804  . MEDLINE mouth rinse  15 mL Mouth Rinse BID Eileen Stanford, PA-C   15 mL at 03/20/18 0905  . metoprolol tartrate (LOPRESSOR) injection 2.5-5 mg  2.5-5 mg Intravenous Q2H PRN Eileen Stanford, PA-C      . ondansetron [facility]) injection 4 mg  4  mg Intravenous Q6H PRN Angelena Form R, PA-C   4 mg at 03/18/18 1238  . oxyCODONE (Oxy IR/ROXICODONE) immediate release tablet 5-10 mg  5-10 mg Oral Q3H PRN Eileen Stanford, PA-C   10 mg at 03/17/18 2056  . polyethylene glycol (MIRALAX / GLYCOLAX) packet 17 g  17 g Oral Daily Barrett, Erin R, PA-C   17 g at 03/21/18 1052  . sodium chloride flush (NS) 0.9 % injection 3 mL  3 mL Intravenous Q12H Eileen Stanford, PA-C   3 mL at 03/21/18 1055  . sodium chloride flush (NS) 0.9 % injection 3 mL  3 mL Intravenous PRN Eileen Stanford, PA-C      . traMADol Veatrice Bourbon) tablet 50-100 mg  50-100 mg Oral Q4H PRN Eileen Stanford, PA-C         Discharge Medications: Please see discharge summary for a list of discharge medications.  Relevant Imaging Results:  Relevant Lab Results:   Additional Information SSN: [id number]  Estanislado Emms, LCSW

## 2018-03-21 NOTE — Clinical Social Work Note (Signed)
Clinical Social Work Assessment  Patient Details  Name: Robert Schaefer MRN: [id number] Date of Birth: [birth year]  Date of referral:  03/21/18               Reason for consult:  Facility Placement, Discharge Planning                Permission sought to share information with:  Facility Sport and exercise psychologist, Family Supports Permission granted to share information::  No(patient not oriented, multiple family members at bedside)  Name::     Robert Schaefer  Agency::  SNFs  Relationship::  spouse  Contact Information:  [phone]  Housing/Transportation Living arrangements for the past 2 months:  Single Family Home Source of Information:  Adult Children, Spouse Patient Interpreter Needed:  None Criminal Activity/Legal Involvement Pertinent to Current Situation/Hospitalization:  No - Comment as needed Significant Relationships:  Spouse, Adult Children Lives with:  Spouse Do you feel safe going back to the place where you live?  Yes Need for family participation in patient care:  Yes (Comment)  Care giving concerns: Patient from home with spouse. PT recommending SNF.   Social Worker assessment / plan: CSW met with patient's family at bedside, including spouse Robert Schaefer, and patient's adult children. Patient not oriented, sleeping. CSW introduced self and role and discussed disposition planning - PT recommendation for SNF. Patient's family indicated they would like patient to go to rehab at SNF, as patient's spouse is not able to care for patient at home with his current needs. CSW sent out initial SNF referrals and provided list of facilities. CSW to provide bed offers to family when available. Will follow for disposition planning.  Employment status:  Retired Forensic scientist:  Medicare PT Recommendations:  [place] / Referral to community resources:  [place]  Patient/Family's Response to care: Patient's family appreciative of  care.  Patient/Family's Understanding of and Emotional Response to Diagnosis, Current Treatment, and Prognosis: Patient's family with good understanding of patient's conditions and care needs. They are agreeable to SNF.  Emotional Assessment Appearance:  Appears stated age Attitude/Demeanor/Rapport:  Unable to Assess Affect (typically observed):  Unable to Assess Orientation:  Oriented to Self Alcohol / Substance use:  Not Applicable Psych involvement (Current and /or in the community):  No (Comment)  Discharge Needs  Concerns to be addressed:  Care Coordination, Discharge Planning Concerns Readmission within the last 30 days:  No Current discharge risk:  Physical Impairment, Cognitively Impaired Barriers to Discharge:  Continued Medical Work up   Robert Emms, LCSW 03/21/2018, 11:55 AM

## 2018-03-22 ENCOUNTER — Encounter: Payer: Self-pay | Admitting: Thoracic Surgery (Cardiothoracic Vascular Surgery)

## 2018-03-22 ENCOUNTER — Encounter: Payer: Self-pay | Admitting: *Deleted

## 2018-03-22 MED ORDER — APIXABAN 2.5 MG PO TABS: 2.5000 mg | ORAL_TABLET | Freq: Two times a day (BID) | ORAL | 6 refills | Status: DC

## 2018-03-22 MED ORDER — POTASSIUM CHLORIDE CRYS ER 20 MEQ PO TBCR
20.0000 meq | EXTENDED_RELEASE_TABLET | Freq: Two times a day (BID) | ORAL | Status: DC
  Administered 2018-03-22: 20 meq via ORAL
  Filled 2018-03-22: qty 1

## 2018-03-22 MED ORDER — TRAMADOL HCL 50 MG PO TABS: 50.0000 mg | ORAL_TABLET | Freq: Four times a day (QID) | ORAL | Status: DC | PRN

## 2018-03-22 NOTE — Evaluation (Signed)
Occupational Therapy Evaluation Patient Details Name: Robert Schaefer MRN: [id number] DOB: [birth year] Today's Date: 03/22/2018    History of Present Illness Pt is a 77 y/o male s/p TAVR. PMH includes PVD, prostate cancer, COPD, AAA, and HTN.    Clinical Impression   PTA, pt was living with his wife who assisted with ADLs. Currently, pt requires Min A for UB ADLs, Mod A for LB ADLs, and Min A for functional mobility using RW. Pt presenting with decreased balance, cognition, and activity tolerance. Provided education to wife and pt on use of 3N1 over toilet as toilet riser or as a BSC at home and use of shower seat in tub; both verbalized understanding. Pt would benefit from further acute OT to facilitate safe dc. Recommend dc to home with HHOT for further OT to optimize safety, independence with ADLs, and return to PLOF. \      Follow Up Recommendations  Home health OT;Supervision/Assistance - 24 hour(Would benefit from [facility] if eligible)    Equipment Recommendations  3 in 1 bedside commode;Other (comment)(Rollator)    Recommendations for Other Services PT consult     Precautions / Restrictions Precautions Precautions: Fall Restrictions Weight Bearing Restrictions: No      Mobility Bed Mobility               General bed mobility comments: Pt in recliner upon arrival  Transfers Overall transfer level: Needs assistance Equipment used: Rolling walker (2 wheeled) Transfers: Sit to/from Stand Sit to Stand: Min assist         General transfer comment: Min A for power up into standing from recliner.    Balance Overall balance assessment: Needs assistance Sitting-balance support: No upper extremity supported;Feet supported Sitting balance-Leahy Scale: Fair     Standing balance support: Bilateral upper extremity supported;During functional activity Standing balance-Leahy Scale: Poor                             ADL either performed or assessed with  clinical judgement   ADL Overall ADL's : Needs assistance/impaired Eating/Feeding: Supervision/ safety;Sitting;Set up   Grooming: Oral care;Min guard;Standing Grooming Details (indicate cue type and reason): Min Guard A for safety while standing at sink for oral care Upper Body Bathing: Minimal assistance;Sitting   Lower Body Bathing: Moderate assistance;Sit to/from stand   Upper Body Dressing : Minimal assistance;Sitting   Lower Body Dressing: Moderate assistance;Sit to/from stand   Toilet Transfer: Minimal assistance;RW;Ambulation(simulated in room)           Functional mobility during ADLs: Minimal assistance;Rolling walker General ADL Comments: Pt with decreased cognition, balance, and activity tolerance. Pt pleasant and agreeable to therapy. Wife very supportive and wants to assist in any way possible     Vision Baseline Vision/History: Wears glasses Wears Glasses: At all times Patient Visual Report: No change from baseline       Perception     Praxis      Pertinent Vitals/Pain Pain Assessment: Faces Faces Pain Scale: Hurts a little bit Pain Location: generalized Pain Intervention(s): Monitored during session     Hand Dominance Right   Extremity/Trunk Assessment Upper Extremity Assessment Upper Extremity Assessment: Generalized weakness   Lower Extremity Assessment Lower Extremity Assessment: Generalized weakness   Cervical / Trunk Assessment Cervical / Trunk Assessment: Kyphotic   Communication Communication Communication: Expressive difficulties(slurred speech )   Cognition Arousal/Alertness: Awake/alert Behavior During Therapy: WFL for tasks assessed/performed Overall Cognitive Status: Impaired/Different from baseline  Area of Impairment: Attention;Memory;Following commands;Safety/judgement;Awareness;Problem solving                 Orientation Level: Disoriented to;Situation Current Attention Level: Sustained Memory: Decreased short-term  memory Following Commands: Follows one step commands inconsistently;Follows one step commands with increased time Safety/Judgement: Decreased awareness of safety;Decreased awareness of deficits Awareness: Emergent Problem Solving: Slow processing;Difficulty sequencing;Requires verbal cues;Requires tactile cues General Comments: Pt requiring cues throughout session and presenting with decreased awareness and problem solving. Upon arrival, pt stating he cannot walk with OT because he is currently havign a procedure. Pt also requiring cues throughout sess that he has on a condom cath and can urinate.    General Comments  Wife present throughout session    Exercises     Shoulder [place] expects to be discharged to:: Private residence Living Arrangements: Spouse/significant other Available Help at Discharge: Family;Available 24 hours/day Type of Home: House Home Access: Stairs to enter [employer] of Steps: 3 Entrance Stairs-Rails: Left Home Layout: One level     Bathroom Shower/Tub: Corporate investment banker: Standard     Home Equipment: Shower seat;Bedside commode          Prior Functioning/Environment Level of Independence: Needs assistance  Gait / Transfers Assistance Needed: Reports ambulated independently ADL's / Homemaking Assistance Needed: Wife reports pt required assist with ADL/IADL tasks.             OT Problem List: Decreased strength;Decreased range of motion;Decreased activity tolerance;Impaired balance (sitting and/or standing);Decreased safety awareness;Decreased cognition;Decreased knowledge of precautions;Decreased knowledge of use of DME or AE      OT Treatment/Interventions: Self-care/ADL training;Therapeutic exercise;DME and/or AE instruction;Energy conservation;Therapeutic activities;Patient/family education;Cognitive remediation/compensation    OT Goals(Current goals can be found in  the care plan section) Acute Rehab OT Goals Patient Stated Goal: Go home with wife OT Goal Formulation: With patient Time For Goal Achievement: 04/05/18 Potential to Achieve Goals: Good ADL Goals Pt Will Perform Grooming: with set-up;with supervision;standing Pt Will Perform Upper Body Dressing: with set-up;with supervision;sitting Pt Will Perform Lower Body Dressing: with min guard assist;sit to/from stand Pt Will Transfer to Toilet: with min guard assist;ambulating;bedside commode Pt Will Perform Tub/Shower Transfer: Tub transfer;shower seat;ambulating;rolling walker;with min guard assist  OT Frequency: Min 2X/week   Barriers to D/C:            Co-evaluation              AM-PAC PT "6 Clicks" Daily Activity     Outcome Measure Help from another person eating meals?: None Help from another person taking care of personal grooming?: A Little Help from another person toileting, which includes using toliet, bedpan, or urinal?: A Little Help from another person bathing (including washing, rinsing, drying)?: A Lot Help from another person to put on and taking off regular upper body clothing?: A Little Help from another person to put on and taking off regular lower body clothing?: A Lot 6 Click Score: 17   End of Session Equipment Utilized During Treatment: Surveyor, mining Communication: Mobility status  Activity Tolerance: Patient tolerated treatment well Patient left: in chair;with call bell/phone within reach;with chair alarm set;with family/visitor present  OT Visit Diagnosis: Unsteadiness on feet (R26.81);Other abnormalities of gait and mobility (R26.89);Muscle weakness (generalized) (M62.81);Other symptoms and signs involving cognitive function                Time: 3500-9381 OT Time Calculation (min): 24 min Charges:  OT  General Charges $OT Visit: 1 Visit OT Evaluation $OT Eval Moderate Complexity: 1 Mod OT Treatments $Self Care/Home Management : 8-22  mins  Johnedward Brodrick MSOT, OTR/L Acute Rehab Pager: [phone] Office: [name] 03/22/2018, 2:30 PM

## 2018-03-22 NOTE — Progress Notes (Signed)
6 Days Post-Op Procedure(s) (LRB): TRANSCATHETER AORTIC VALVE REPLACEMENT, TRANSAPICAL (Left) Subjective: No complaints.   Son-in-law stayed last night and says that patient slept all night.  Objective: Vital signs in last 24 hours: Temp:  [98 F (36.7 C)-98.8 F (37.1 C)] 98.8 F (37.1 C) (09/23 0400) Pulse Rate:  [66-78] 78 (09/23 0400) Cardiac Rhythm: Heart block (09/23 0700) Resp:  [18-23] 20 (09/23 0400) BP: (128-147)/(56-65) 128/58 (09/23 0400) SpO2:  [93 %-96 %] 95 % (09/23 0400) Weight:  [60 kg] 60 kg (09/23 0303)  Hemodynamic parameters for last 24 hours:    Intake/Output from previous day: 09/22 0701 - 09/23 0700 In: 600 [P.O.:600] Out: 550 [Urine:550] Intake/Output this shift: No intake/output data recorded.  General appearance: alert and cooperative Neurologic: intact Heart: regular rate and rhythm, S1, S2 normal, no murmur, click, rub or gallop Lungs: clear to auscultation bilaterally Extremities: extremities normal, atraumatic, no cyanosis or edema Wound: incision ok  Lab Results: Recent Labs    03/20/18 0321 03/21/18 0338  WBC 5.2 5.3  HGB 9.2* 10.3*  HCT 27.6* 31.4*  PLT 95* 104*   BMET:  Recent Labs    03/20/18 0321 03/21/18 0338  NA 141 140  K 4.0 3.7  CL 104 103  CO2 32 26  GLUCOSE 130* 131*  BUN 13 12  CREATININE 0.76 0.71  CALCIUM 9.2 9.3    PT/INR: No results for input(s): LABPROT, INR in the last 72 hours. ABG    Component Value Date/Time   PHART 7.327 (L) 03/16/2018 1117   HCO3 25.8 03/16/2018 1117   TCO2 27 03/16/2018 1117   ACIDBASEDEF 1.0 03/16/2018 1117   O2SAT 95.0 03/16/2018 1117   CBG (last 3)  No results for input(s): GLUCAP in the last 72 hours.  Assessment/Plan: S/P Procedure(s) (LRB): TRANSCATHETER AORTIC VALVE REPLACEMENT, TRANSAPICAL (Left)  POD 6 He has been hemodynamically stable and is in sinus rhythm 70's this am.   Postop atrial fib with controlled rate but now back in sinus over the weekend. He  was started on low dose Eliquis over the weekend. With the atrial fib I think it is probably best to use low dose Eliquis 2.5 bid and ASA 81 until we are sure that he is maintaining sinus for a while. If he is still in sinus when he is seen back in the office he can be switched to ASA and Plavix.   Dementia and confusion: He seems fine this morning but I am not surprised that he gets confused at times in the hospital. This should resolve when he eventually gets home. His family does not feel that they can take care of him at home now so will plan on rehab at [facility]. Social work has seen. I think he is medically stable to go whenever.    LOS: 6 days    Gaye Pollack 03/22/2018

## 2018-03-22 NOTE — Care Management Note (Signed)
Case Management Note Marvetta Gibbons RN, BSN Unit 4E- RN Care Coordinator  [phone]  Patient Details  Name: Robert Schaefer MRN: [id number] Date of Birth: [birth year]  Subjective/Objective:    Pt admitted s/p TAVR, post op afib and confusion               Action/Plan: PTA pt lived at home with spouse, hx dementia, per PT eval recommendation for SNF, CSW has been consulted and following for SNF, however per Caryl Pina, CSW on conversation with family today, they have decided to return home with pt and can provide 24/7 care initially. Spoke with pt's children regarding transition plan. They state that they can help to provide care this week and then intermittently next week. Discussed [facility] benefits of pt's insurance and offered Home First program with [facility] as part of pt's insurance benefits to help aide family in decision to take pt home. Family very interested in Home First program and agreeable to using [facility] for [facility] services. Also discussed DME needs- pt could benefit from 3n1 and rollator for home will have orders placed for transition home. Call made to [facility] with [facility] for DME needs- 3n1 and rollator to be delivered to room prior to discharge. Call also made [facility] with [facility] for Home First referral- Tommi Rumps will come meet family at bedside for Home First needs.   Expected Discharge Date:  03/22/18               Expected Discharge Plan:  Skilled Nursing Facility  In-House Referral:  Clinical Social Work  Discharge planning Services  CM Consult  Post Acute Care Choice:  Durable Medical Equipment, Home Health Choice offered to:  Adult Children  DME Arranged:  3-N-1, Walker rolling with seat DME Agency:  [place] Arranged:  RN, PT, OT, Refused SNF, Nurse's Aide, Social Work [employer] Agency:  [facility](Home First program)  Status of Service:  Completed, signed off  If discussed at [employer], dates discussed:    Discharge Disposition:  home/home health   Additional Comments:  Dawayne Patricia, RN 03/22/2018, 2:05 PM

## 2018-03-22 NOTE — Progress Notes (Signed)
Physical Therapy Treatment Patient Details Name: Robert Schaefer MRN: [id number] DOB: Nov 02, 1940 Today's Date: 03/22/2018    History of Present Illness Pt is a 77 y/o male s/p TAVR. PMH includes PVD, prostate cancer, COPD, AAA, and HTN.     PT Comments    Pt with much improved mobility. Did much better using rollator. Family present and pleased with progress. Family now planning to take pt home and provide 24 hour assist.   Follow Up Recommendations  Supervision/Assistance - 24 hour;Home health PT     Equipment Recommendations  3in1 (PT)(Pt has rollator at home)    Recommendations for Other Services       Precautions / Restrictions Precautions Precautions: Fall Restrictions Weight Bearing Restrictions: No    Mobility  Bed Mobility Overal bed mobility: Needs Assistance Bed Mobility: Supine to Sit     Supine to sit: Min assist;HOB elevated     General bed mobility comments: Assist to elevate trunk into sitting  Transfers Overall transfer level: Needs assistance Equipment used: 4-wheeled walker Transfers: Sit to/from Stand Sit to Stand: Min guard         General transfer comment: Assist for safety  Ambulation/Gait Ambulation/Gait assistance: Min guard;Supervision Gait Distance (Feet): 250 Feet Assistive device: 4-wheeled walker Gait Pattern/deviations: Step-through pattern;Decreased stride length Gait velocity: Decreased  Gait velocity interpretation: 1.31 - 2.62 ft/sec, indicative of limited community ambulator General Gait Details: Pt with improved stability and pace when using rollator. Only 1 verbal cue to stay closer to walker.   Stairs             Wheelchair Mobility    Modified Rankin (Stroke Patients Only)       Balance Overall balance assessment: Needs assistance Sitting-balance support: No upper extremity supported;Feet supported Sitting balance-Leahy Scale: Fair     Standing balance support: No upper extremity supported Standing  balance-Leahy Scale: Fair                              Cognition Arousal/Alertness: Awake/alert Behavior During Therapy: WFL for tasks assessed/performed Overall Cognitive Status: Impaired/Different from baseline Area of Impairment: Safety/judgement;Problem solving;Memory                     Memory: Decreased short-term memory   Safety/Judgement: Decreased awareness of safety   Problem Solving: Requires verbal cues        Exercises      General Comments General comments (skin integrity, edema, etc.): multiple family members present      Pertinent Vitals/Pain Pain Assessment: No/denies pain    Home Living Family/patient expects to be discharged to:: Private residence Living Arrangements: Spouse/significant other Available Help at Discharge: Family;Available 24 hours/day Type of Home: House Home Access: Stairs to enter Entrance Stairs-Rails: Left Home Layout: One level Home Equipment: Shower seat;Bedside commode      Prior Function Level of Independence: Needs assistance  Gait / Transfers Assistance Needed: Reports ambulated independently ADL's / Homemaking Assistance Needed: Wife reports pt required assist with ADL/IADL tasks.      PT Goals (current goals can now be found in the care plan section) Progress towards PT goals: Goals met and updated - see care plan    Frequency    Min 2X/week      PT Plan Discharge plan needs to be updated    Co-evaluation              AM-PAC PT "6 Clicks" Daily  Activity  Outcome Measure  Difficulty turning over in bed (including adjusting bedclothes, sheets and blankets)?: A Little Difficulty moving from lying on back to sitting on the side of the bed? : Unable Difficulty sitting down on and standing up from a chair with arms (e.g., wheelchair, bedside commode, etc,.)?: A Little Help needed moving to and from a bed to chair (including a wheelchair)?: A Little Help needed walking in hospital room?:  A Little Help needed climbing 3-5 steps with a railing? : A Little 6 Click Score: 16    End of Session Equipment Utilized During Treatment: Gait belt Activity Tolerance: Patient tolerated treatment well Patient left: with call bell/phone within reach;in chair;with chair alarm set;with family/visitor present Nurse Communication: Mobility status PT Visit Diagnosis: Unsteadiness on feet (R26.81);Muscle weakness (generalized) (M62.81)     Time: 7981-0254 PT Time Calculation (min) (ACUTE ONLY): 17 min  Charges:  $Gait Training: 8-22 mins                     [place] Pager [phone] Office [place] 03/22/2018, 2:46 PM

## 2018-03-22 NOTE — Progress Notes (Signed)
CSW following patient and family for support.CSW spoke with family (patients 2 daughters and there husbands) and they have decided to take patient back home. CSW invited RNCM on the floor to have discussion with family. Family agreeable with home health in the home to follow patient and patients daughter stated she will stay with patient until Monday. CSW signing off as social work needs have been met.   Rhea Pink, MSW,  [place]

## 2018-03-22 NOTE — Anesthesia Postprocedure Evaluation (Signed)
Anesthesia Post Note  Patient: Robert Schaefer  Procedure(s) Performed: TRANSCATHETER AORTIC VALVE REPLACEMENT, TRANSAPICAL (Left Chest)     Patient location during evaluation: SICU Anesthesia Type: General Level of consciousness: awake and alert, oriented and patient cooperative Pain management: pain level controlled Vital Signs Assessment: post-procedure vital signs reviewed and stable Respiratory status: spontaneous breathing, nonlabored ventilation, respiratory function stable and patient connected to nasal cannula oxygen Cardiovascular status: blood pressure returned to baseline and stable Postop Assessment: no apparent nausea or vomiting and adequate PO intake Anesthetic complications: no Comments: *delayed entry, pt eval post op same day as TAVR    Last Vitals:  Vitals:   03/21/18 2310 03/22/18 0400  BP:  (!) 128/58  Pulse: 66 78  Resp: (!) 23 20  Temp:  37.1 C  SpO2: 93% 95%    Last Pain:  Vitals:   03/22/18 0400  TempSrc: Oral  PainSc:                  Jeremiah Tarpley,E. Tyriq Moragne

## 2018-03-22 NOTE — Progress Notes (Signed)
  Speech Language Pathology Treatment: Dysphagia  Patient Details Name: Robert Schaefer MRN: [id number] DOB: December 23, 1940 Today's Date: 03/22/2018 Time: 1638-4536 SLP Time Calculation (min) (ACUTE ONLY): 23 min  Assessment / Plan / Recommendation Clinical Impression  Pt was alert and engaged throughout dysphagia treatment session during breakfast. Although pt demonstrated impulsivity evident in quick sips and bites, pt responded to min verbal cues from clinician to adjust behavior, and no overt s/s aspiration were observed with thin or puree texture PO's. Prolonged mastication and immedicate cough following D3 (soft) noted, due to pt's missing dentition secondary to previous radiation therapy. Discussed options to downgrade diet, although pt indicated preference to stay on D3. He commented he is currently awaiting dentures to arrive and reported ability to avoid foods that are difficult to masticate until then. Pt aware of esophogeal dysphagia and was receptive to SLPs education regarding esophogeal precautions to avoid discomfort during and after meals. Recommend continue current diet and d/c from [place], as no further treatment is indicated at this time.    HPI HPI: Patient is a 77 y.o. male s/p TAVR with PMH: PVD, prostate cancer, COPD, AAA, HTN. Over the past year, patient has had significant swallowing difficulty and was diagnosed with achalasia type II, severe GEJ stricture, large distal esophageal diverticulum.      SLP Plan  Discharge SLP treatment due to (comment)(no further services indicated)       Recommendations  Diet recommendations: Dysphagia 3 (mechanical soft);Thin liquid Liquids provided via: Cup;Straw Medication Administration: Whole meds with liquid Supervision: Patient able to self feed Compensations: Minimize environmental distractions;Slow rate;Small sips/bites Postural Changes and/or Swallow Maneuvers: Seated upright 90 degrees;Upright 30-60 min after meal                 Oral Care Recommendations: Oral care BID Follow up Recommendations: None SLP Visit Diagnosis: Dysphagia, unspecified (R13.10) Plan: Discharge SLP treatment due to (comment)(no further services indicated)       Jettie Booze, Student SLP                Jettie Booze 03/22/2018, 10:03 AM

## 2018-03-23 ENCOUNTER — Telehealth: Payer: Self-pay | Admitting: Physician Assistant

## 2018-03-23 ENCOUNTER — Other Ambulatory Visit: Payer: Self-pay | Admitting: *Deleted

## 2018-03-23 DIAGNOSIS — I480 Paroxysmal atrial fibrillation: Secondary | ICD-10-CM | POA: Diagnosis not present

## 2018-03-23 DIAGNOSIS — Z48812 Encounter for surgical aftercare following surgery on the circulatory system: Secondary | ICD-10-CM | POA: Diagnosis not present

## 2018-03-23 NOTE — Telephone Encounter (Signed)
  [place] VALVE TEAM   Patient contacted regarding discharge from [facility] on 03/22/18  Patient understands to follow up with provider Nell Range on 10/2 at [place].  Patient understands discharge instructions? yes Patient understands medications and regiment? yes Patient understands to bring all medications to this visit? yes  Angelena Form PA-C  [facility]

## 2018-03-23 NOTE — Patient Outreach (Signed)
Made aware by Tommi Rumps with [facility], of patient's enrollment with [facility] First program. Advised to contact either daughter Jeannene Patella or [name].  Telephone call made to [name] at [phone] to discuss that [place] [employer] will assist if transportation needs are identified while on the [place] program. [name] states she usually takes her father to his MD appointments so she does not think it will be an issue. Made [name] aware that [place] [employer] may potentially follow post [facility] First if community case management needs are identified.  [name] expressed appreciation of call.  Notification sent to [place] [employer] office to make aware of [facility] First enrollment.   Marthenia Rolling, MSN-Ed, RN,BSN [facility] Liaison [phone]

## 2018-03-24 DIAGNOSIS — Z48812 Encounter for surgical aftercare following surgery on the circulatory system: Secondary | ICD-10-CM | POA: Diagnosis not present

## 2018-03-24 DIAGNOSIS — I480 Paroxysmal atrial fibrillation: Secondary | ICD-10-CM | POA: Diagnosis not present

## 2018-03-25 ENCOUNTER — Ambulatory Visit: Payer: Medicare Other | Admitting: Physician Assistant

## 2018-03-25 DIAGNOSIS — I480 Paroxysmal atrial fibrillation: Secondary | ICD-10-CM | POA: Diagnosis not present

## 2018-03-25 DIAGNOSIS — Z48812 Encounter for surgical aftercare following surgery on the circulatory system: Secondary | ICD-10-CM | POA: Diagnosis not present

## 2018-03-26 DIAGNOSIS — I480 Paroxysmal atrial fibrillation: Secondary | ICD-10-CM | POA: Diagnosis not present

## 2018-03-26 DIAGNOSIS — Z48812 Encounter for surgical aftercare following surgery on the circulatory system: Secondary | ICD-10-CM | POA: Diagnosis not present

## 2018-03-28 NOTE — Progress Notes (Signed)
[facility] [place]                                       Cardiology Office Note    Date:  03/31/2018   ID:  Robert Schaefer, DOB Oct 02, 1940, MRN [id number]  PCP:  [name], MD  Cardiologist: Dr. [name] / Dr. Burt Knack & Dr. Cyndia Bent (TAVR)  CC: [facility] s/p TAVR  History of Present Illness:  Robert Schaefer is a 77 y.o. male with a history of HTN, HLD, hypothyroidism, OSA intolerant to CPAP, COPD, prostate cancer s/p XRT, PAD s/p bilateral common iliac artery stenting, achalasia with weight loss and severe AS s/p TAVR (03/16/18) who presents to clinic for follow up.   Over the past year or so the patient has had significant swallowing difficulty and was diagnosed with achalasia. He has lost about 50 pounds over the past year. He was seen by Dr. Johney Maine and was felt that he would require surgical treatment but required cardiac clearance. He was evaluated by Dr. [name] and had an echocardiogram on 01/01/2018 which showed a severely calcified aortic valve with a mean gradient of 59 mmHg and a peak gradient of 120 mmHg. Dimensionless index was 0.23. Left ventricular ejection fraction was 60 to 65% with grade 1 diastolic dysfunction. He underwent cardiac catheterization on 01/25/2018 which showed no significant coronary disease. The aortic valve gradient was 50 mmHg. Aortic valve area was 0.55 cm. LVEDP was 23. PA pressure was 37/14 with a mean wedge pressure of 13. Bilateral iliac artery stents were patent.TAVR with a54mm Edwards Sapien 3 THV via the transapicalapproach on 03/16/17. Post operative echoshowed a EF 60% w/ a normally functioning TAVR valve (gradients not reported).  He underwent successful TAVR with a55mm Edwards Sapien 3 THV via the transapicalapproach on 03/16/17. Post operative echoshowed a EF 60% w/ a normally functioning TAVR valve (gradients not reported). Initial ECG showed new LBBB with 1st degree AV block, which resolved and then  returned. His admission was prolonged due to chronic dementia with delirium. The patient was noted to go into afib during his admission, which is a new diagnosis for him. He spontaneously converted back to sinus. He was started on low dose Eliquis 2.5 mg BID for thromboembolic prophlaxis.   Today he presents to clinic for follow up. He can tell a big difference in his breathing. No SOB with physical therapy sessions. He has had a couple episodes where he had some "zingers" in his chest that come and go quickly. No LE edema, orthopnea or PND. No dizziness or syncope. No blood in stool or urine. No palpitations. He is not going to be able to afford Eliquis. It was over 400$ for a month supply.      Past Medical History:  Diagnosis Date  . AAA (abdominal aortic aneurysm) ([place])   . Achalasia   . Carotid stenosis, asymptomatic, bilateral    per duplex --- bilateral <50% ICA  . COPD (chronic obstructive pulmonary disease) ([place])    pulmologist-  dr [name]  . GERD (gastroesophageal reflux disease)   . Hyperlipidemia   . Hypertension   . Hypothyroidism   . Lesion of left lung    followed by dr [name] (pulmologist)  post infection scarring  . Mild dementia   . OSA (obstructive sleep apnea)    has a cpap-  intolerant; due to Claustaphobia (  01/25/2018)  . Peripheral vascular disease  [facility])    cardiologist-- dr berry-- per last ABI/Aorta/Iliac 06-09-2016  patent bilateral iliac kissing stents, >50% stenosis bilateral iliac artery (R>L) and left external iliac artery, patent IVC  . Prostate cancer [facility])    urologist-  dr [name] oncologist-  dr [name]. Dx'd 2018 s/p XRT  . RLS (restless legs syndrome)   . S/P TAVR (transcatheter aortic valve replacement)    s/p TAVR via transapical approach with an Edwards Sapien 3 THV (size 26 mm, model # 9600TFX, serial # [id number])  . Schatzki's ring   . Severe aortic stenosis     Past Surgical History:  Procedure Laterality Date  . ABDOMINAL AORTOGRAM N/A  01/25/2018   Procedure: ABDOMINAL AORTOGRAM;  Surgeon: [name], MD;  Location: [facility];  Service: Cardiovascular;  Laterality: N/A;  . ANAL FISTULECTOMY  2010  . CARDIAC CATHETERIZATION Bilateral 01/25/2018  . CARDIOVASCULAR STRESS TEST  04-23-2016   dr berry   normal nuclear study w/ no ischemia/  normal LV function and wall function, nuclear stress ef 57%  . CATARACT EXTRACTION W/ INTRAOCULAR LENS  IMPLANT, BILATERAL Bilateral   . COLONOSCOPY    . ESOPHAGEAL MANOMETRY N/A 10/12/2017   Procedure: ESOPHAGEAL MANOMETRY (EM);  Surgeon: [name], MD;  Location: [facility];  Service: Gastroenterology;  Laterality: N/A;  . ESOPHAGOGASTRODUODENOSCOPY N/A 10/12/2017   Procedure: ESOPHAGOGASTRODUODENOSCOPY (EGD);  Surgeon: [name], MD;  Location: [facility];  Service: Gastroenterology;  Laterality: N/A;  . ESOPHAGOGASTRODUODENOSCOPY (EGD) WITH ESOPHAGEAL DILATION  last one 11-09-2015  dr Yolanda Bonine   dilation schatzki's ring  . GOLD SEED IMPLANT N/A 04/29/2017   Procedure: GOLD SEED IMPLANT;  Surgeon: [name], MD;  Location: [facility];  Service: Urology;  Laterality: N/A;  . HERNIA REPAIR    . INGUINAL HERNIA REPAIR Right 02/23/2014   Procedure: HERNIA REPAIR INGUINAL ADULT;  Surgeon: Joyice Faster. Cornett, MD;  Location: [place];  Service: General;  Laterality: Right;  . INSERTION OF MESH Right 02/23/2014   Procedure: INSERTION OF MESH;  Surgeon: Joyice Faster. Cornett, MD;  Location: [place];  Service: General;  Laterality: Right;  . PERIPHERAL VASCULAR CATHETERIZATION N/A 05/29/2016   Procedure: Lower Extremity Angiography;  Surgeon: [name], MD;  Location: [place] [facility];  Service: Cardiovascular;  Laterality: N/A; [place] orbital rotational atherectomy, PTA and stenting bilateral common iliac arteries   . PILONIDAL CYST / SINUS EXCISION  2000  . PROSTATE BIOPSY  09-22-2016  dr [name]  office  . RIGHT/LEFT HEART CATH AND CORONARY ANGIOGRAPHY N/A 01/25/2018   Procedure: RIGHT/LEFT HEART CATH AND CORONARY ANGIOGRAPHY;  Surgeon: [name], MD;  Location: [place] [facility];  Service: Cardiovascular;  Laterality: N/A;  . SAVORY DILATION N/A 10/12/2017   Procedure: SAVORY DILATION;  Surgeon: [name], MD;  Location: [facility];  Service: Gastroenterology;  Laterality: N/A;  . SPACE OAR INSTILLATION N/A 04/29/2017   Procedure: SPACE OAR INSTILLATION;  Surgeon: [name], MD;  Location[facility];  Service: Urology;  Laterality: N/A;  . TRANSCATHETER AORTIC VALVE REPLACEMENT, TRANSAPICAL Left 03/16/2018   Procedure: TRANSCATHETER AORTIC VALVE REPLACEMENT, TRANSAPICAL;  Surgeon: Sherren Mocha, MD;  Location: [place];  Service: Open Heart Surgery;  Laterality: Left;  . TRANSTHORACIC ECHOCARDIOGRAM  05-05-2016   dr berry   ef 10-96%,  grade 1diastolic dysfunction/  moderate AV stenosis with mod. AR (right coronary and noncoronary cusp mobility seversly restricted);  valve area 1.25cm^2, mean gradient 27 mmHg, peak gradient 66mmHg/  mild MR  . UPPER GASTROINTESTINAL ENDOSCOPY      Current Medications: Outpatient Medications Prior to Visit  Medication Sig Dispense Refill  . albuterol (PROVENTIL HFA;VENTOLIN HFA) 108 (90 Base) MCG/ACT inhaler Inhale 1 puff into the lungs every 6 (six) hours as needed for wheezing or shortness of breath.    . alfuzosin (UROXATRAL) 10 MG 24 hr tablet Take 10 mg by mouth daily. After lunch    . apixaban (ELIQUIS) 2.5 MG TABS tablet Take 1 tablet (2.5 mg total) by mouth 2 (two) times daily. 60 tablet 6  . aspirin EC 81 MG tablet Take 81 mg by mouth daily.    . Cholecalciferol (VITAMIN D-3) 5000 units TABS Take 5,000 Units by mouth daily at 3 pm.    . feeding supplement, ENSURE ENLIVE, (ENSURE ENLIVE) LIQD Take 237 mLs by mouth 3 (three) times daily between meals.    . fluticasone (FLONASE) 50 MCG/ACT nasal spray  Place 2 sprays into both nostrils daily as needed for allergies or rhinitis.     [name] levothyroxine (SYNTHROID, LEVOTHROID) 50 MCG tablet Take 50 mcg by mouth daily before breakfast.     No facility-administered medications prior to visit.      Allergies:   Penicillins; Sulfa antibiotics; and Versed [midazolam]   Social History   Socioeconomic History  . Marital status: Married    Spouse name: Not on file  . Number of children: Not on file  . Years of education: Not on file  . Highest education level: Not on file  Occupational History  . Not on file  Social Needs  . Financial resource strain: Not on file  . Food insecurity:    Worry: Not on file    Inability: Not on file  . Transportation needs:    Medical: Not on file    Non-medical: Not on file  Tobacco Use  . Smoking status: Former Smoker    Packs/day: 1.00    Years: 50.00    Pack years: 50.00    Types: Cigarettes    Last attempt to quit: 07/05/2005    Years since quitting: 12.7  . Smokeless tobacco: Never Used  Substance and Sexual Activity  . Alcohol use: No  . Drug use: Never  . Sexual activity: Not Currently  Lifestyle  . Physical activity:    Days per week: Not on file    Minutes per session: Not on file  . Stress: Not on file  Relationships  . Social connections:    Talks on phone: Not on file    Gets together: Not on file    Attends religious service: Not on file    Active member of club or organization: Not on file    Attends meetings of clubs or organizations: Not on file    Relationship status: Not on file  Other Topics Concern  . Not on file  Social History Narrative  . Not on file     Family History:  The patient's family history includes Asthma in his father; Heart attack in his mother; Stroke in his father.      ROS:   Please see the history of present illness.    ROS All other systems reviewed and are negative.   PHYSICAL EXAM:   VS:  BP (!) 144/66   Pulse 77   Ht 5\' 5"  (1.651 m)   Wt  133 lb 6.4 oz (60.5 kg)   BMI 22.20  kg/m    GEN: Well nourished, well developed, in no acute distress  HEENT: normal  Neck: no JVD, carotid bruits, or masses Cardiac: RRR; no murmurs, rubs, or gallops,no edema  Respiratory:  clear to auscultation bilaterally, normal work of breathing GI: soft, nontender, nondistended, + BS MS: no deformity or atrophy  Skin: warm and dry, no rash Neuro:  Alert and Oriented x 3, Strength and sensation are intact Psych: euthymic mood, full affect     Wt Readings from Last 3 Encounters:  03/31/18 133 lb 6.4 oz (60.5 kg)  03/22/18 132 lb 4.4 oz (60 kg)  03/11/18 139 lb 4.8 oz (63.2 kg)      Studies/Labs Reviewed:   EKG:  EKG is ordered today.  The ekg ordered today demonstrates sinus with LBBB and 1st deg AV block. HR 77  Recent Labs: 03/11/2018: ALT 12; B Natriuretic Peptide 164.1 03/17/2018: Magnesium 1.9 03/21/2018: BUN 12; Creatinine, Ser 0.71; Hemoglobin 10.3; Platelets 104; Potassium 3.7; Sodium 140   Lipid Panel    Component Value Date/Time   CHOL 141 02/26/2017 1046   TRIG 58 02/26/2017 1046   HDL 50 02/26/2017 1046   CHOLHDL 2.8 02/26/2017 1046   VLDL 19 09/15/2016 1729   LDLCALC 78 02/26/2017 1046    Additional studies/ records that were reviewed today include:  [place] NOTE  Date of Procedure:03/16/2018  Preoperative Diagnosis:Severe Aortic Stenosis  Procedure:   Transcatheter Aortic Valve Replacement - Transapical Approach Edwards Sapien 3THV (size 8mm, model # 9600TFX, serial #[id number])  Co-Surgeons:Bryan Alveria Apley, MD and Sherren Mocha, MD   Pre-operative Echo Findings: ? severe aortic stenosis ? normalleft ventricular systolic function   Post-operative Echo Findings: ? noparavalvular leak ? normalleft ventricular systolic function   ____________________   Post operative echo 03/17/18 Study  Conclusions - Left ventricle: The cavity size was normal. Wall thickness was normal. Systolic function was normal. The estimated ejection fraction was in the range of 60% to 65%. Wall motion was normal; there were no regional wall motion abnormalities. - Aortic valve: A bioprosthesis was present.    ASSESSMENT & PLAN:   Severe AS s/p TAVR: doing excellent. ECG with persistent LBBB and 1st deg AV block but no high grade block. Chest wall incision healing well. Chest tube suture removed. Groin site healing well. He notices a big difference in his breathing. Working hard with PT. SBE prophylaxis discussed. He has full dentures. I will see him back in 1 month for follow up and echo  Paroxysmal atrial fibrillation: maintaining NSR. Continue Eliquis 2.5mg  BID. This is not going to be affordable for him. Samples given and patient assistance forms filled out.   HTN: BP with moderate control. No changes made  COPD: stable  Achalasia: he may require surgery for this in the future.   Medication Adjustments/Labs and Tests Ordered: Current medicines are reviewed at length with the patient today.  Concerns regarding medicines are outlined above.  Medication changes, Labs and Tests ordered today are listed in the Patient Instructions below. Patient Instructions  Medication Instructions:  Your provider recommends that you continue on your current medications as directed. Please refer to the Current Medication list given to you today.    Labwork: None  Testing/Procedures: Please keep your scheduled appointment for your echocardiogram on 10/23!  Follow-Up: Please keep your appointments on 04/21/18. You will need to arrive by 1:15PM for your visit with Nell Range. You will have your echocardiogram right after the ultrasound.   Any Other Special  Instructions Will Be Listed Below (If Applicable).     If you need a refill on your cardiac medications before your next appointment,  please call your pharmacy.      Signed, Angelena Form, PA-C  03/31/2018 1:43 PM    [facility] [address], [place], [place]  [address] Phone: [phone]; Fax: [phone]

## 2018-03-29 DIAGNOSIS — I480 Paroxysmal atrial fibrillation: Secondary | ICD-10-CM | POA: Diagnosis not present

## 2018-03-29 DIAGNOSIS — Z48812 Encounter for surgical aftercare following surgery on the circulatory system: Secondary | ICD-10-CM | POA: Diagnosis not present

## 2018-03-30 ENCOUNTER — Telehealth: Payer: Self-pay | Admitting: *Deleted

## 2018-03-30 DIAGNOSIS — I480 Paroxysmal atrial fibrillation: Secondary | ICD-10-CM | POA: Diagnosis not present

## 2018-03-30 DIAGNOSIS — Z48812 Encounter for surgical aftercare following surgery on the circulatory system: Secondary | ICD-10-CM | POA: Diagnosis not present

## 2018-03-30 NOTE — Telephone Encounter (Signed)
Robert Schaefer, OT with [facility], called and reported the patient's BP is 120/54 at his visit. He is in no distress.  Per Dr Melford Aase, the BP is OK and he may have his therapy session today.

## 2018-03-31 ENCOUNTER — Ambulatory Visit (INDEPENDENT_AMBULATORY_CARE_PROVIDER_SITE_OTHER): Payer: Medicare Other | Admitting: Physician Assistant

## 2018-03-31 VITALS — BP 144/66 | HR 77 | Ht 65.0 in | Wt 133.4 lb

## 2018-03-31 DIAGNOSIS — J449 Chronic obstructive pulmonary disease, unspecified: Secondary | ICD-10-CM | POA: Diagnosis not present

## 2018-03-31 DIAGNOSIS — I10 Essential (primary) hypertension: Secondary | ICD-10-CM

## 2018-03-31 DIAGNOSIS — K220 Achalasia of cardia: Secondary | ICD-10-CM

## 2018-03-31 DIAGNOSIS — I480 Paroxysmal atrial fibrillation: Secondary | ICD-10-CM

## 2018-03-31 DIAGNOSIS — Z952 Presence of prosthetic heart valve: Secondary | ICD-10-CM | POA: Diagnosis not present

## 2018-03-31 DIAGNOSIS — Z48812 Encounter for surgical aftercare following surgery on the circulatory system: Secondary | ICD-10-CM | POA: Diagnosis not present

## 2018-03-31 NOTE — Patient Instructions (Signed)
Medication Instructions:  Your provider recommends that you continue on your current medications as directed. Please refer to the Current Medication list given to you today.    Labwork: None  Testing/Procedures: Please keep your scheduled appointment for your echocardiogram on 10/23!  Follow-Up: Please keep your appointments on 04/21/18. You will need to arrive by 1:15PM for your visit with Nell Range. You will have your echocardiogram right after the ultrasound.   Any Other Special Instructions Will Be Listed Below (If Applicable).     If you need a refill on your cardiac medications before your next appointment, please call your pharmacy.

## 2018-04-01 ENCOUNTER — Telehealth: Payer: Self-pay | Admitting: *Deleted

## 2018-04-01 DIAGNOSIS — Z48812 Encounter for surgical aftercare following surgery on the circulatory system: Secondary | ICD-10-CM | POA: Diagnosis not present

## 2018-04-01 DIAGNOSIS — I480 Paroxysmal atrial fibrillation: Secondary | ICD-10-CM | POA: Diagnosis not present

## 2018-04-01 NOTE — Telephone Encounter (Signed)
Completed provider portion for [facility] patient assistance for [employer]. Will place in Dr York Cerise mailbox, he works Architectural technologist.

## 2018-04-02 ENCOUNTER — Telehealth: Payer: Self-pay

## 2018-04-02 NOTE — Telephone Encounter (Signed)
   [facility] Pre-operative Risk Assessment    Request for surgical clearance:  1. What type of surgery is being performed?  Denture try-in   2. When is this surgery scheduled?  04/05/18   3. What type of clearance is required (medical clearance vs. Pharmacy clearance to hold med vs. Both)? both  4. Are there any medications that need to be held prior to surgery and how long?   5. Practice name and name of physician performing surgery?  Dr Arita Miss   6. What is your office phone number [phone]    7.   What is your office fax number [phone]  8.   Anesthesia type (None, local, MAC, general) ?  local   Frederik Schmidt 04/02/2018, 9:07 AM  _________________________________________________________________   (provider comments below)

## 2018-04-02 NOTE — Telephone Encounter (Signed)
Please call the patient for procedure clarification. Per surgical clearance form it is listed as "denture try-in". Unclear as to what this entails. He had recent TAVR and was seen by Nell Range, PA on 03/31/18. No comment was made about procedure, however will likely need SBE given recent TAVR.   Kathyrn Drown NP-C [place] Pager: [phone]

## 2018-04-02 NOTE — Telephone Encounter (Signed)
Attempted to call the dentist office. They are closed on Friday; re-open on Monday.   Spoke to pt to ask if he knew why he would need clearance for the denture try-in. He stated he was not sure. Told pt I was unable to reach the dentist today since they are closed but we will try to call them next week and call him back about his clearance.   Pt verbalized thanks.

## 2018-04-05 NOTE — Telephone Encounter (Signed)
Yes agree to pre-medicate with ABX with his TAVR.

## 2018-04-05 NOTE — Telephone Encounter (Signed)
Faxed via Epic fax to Dr. Arita Miss [phone].

## 2018-04-05 NOTE — Telephone Encounter (Addendum)
Spoke with Peggy at Dr. Ronnald Ramp office. Not a surgical try in.  Just trying in a denture to be sure it fits correctly before they finish the dentures.  They decided that since they didn't hear back from [place] to just go ahead and pre medicate him with antibiotics, considering his recent TAVR.  He is going to be seen this afternoon.  They are giving clindamycin and expect that he may need to come 2 more times for this same thing.   Would still like to have information faxed back to them for his record.

## 2018-04-06 DIAGNOSIS — Z48812 Encounter for surgical aftercare following surgery on the circulatory system: Secondary | ICD-10-CM | POA: Diagnosis not present

## 2018-04-06 DIAGNOSIS — I480 Paroxysmal atrial fibrillation: Secondary | ICD-10-CM | POA: Diagnosis not present

## 2018-04-07 NOTE — Telephone Encounter (Signed)
Dr Burt Knack has signed the pts [facility] pt asst application and I have faxed it to [facility].

## 2018-04-08 DIAGNOSIS — I480 Paroxysmal atrial fibrillation: Secondary | ICD-10-CM | POA: Diagnosis not present

## 2018-04-08 DIAGNOSIS — Z48812 Encounter for surgical aftercare following surgery on the circulatory system: Secondary | ICD-10-CM | POA: Diagnosis not present

## 2018-04-10 NOTE — Progress Notes (Signed)
FOLLOW UP  Assessment:    Essential hypertension - continue medications, DASH diet, exercise and monitor at home. Call if greater than 130/80.  At goal  PVD (peripheral vascular disease) ([place]) Control blood pressure, cholesterol, glucose, increase exercise.   Atherosclerosis of aorta ([facility]) Control blood pressure, cholesterol, glucose, increase exercise.   COPD No triggers, well controlled symptoms, cont to monitor   Aortic stenosis, moderate S/p TAVR on the 17th,  SOB improved, doing well.   COPD with asthma ([place]) Not on CPAP, states can not wear, STRONGLY SUGGEST getting on it with heart/lung history, given information and will consider Follow up pulmonary  New Afib Continue eliquis Rate controlled Follow up cardio  Cerumen impaction right ear - stop using Qtips, irrigation used in the office without complications, use OTC drops/oil at home to prevent reoccurence  Over 30 minutes of exam, counseling, chart review and critical decision making was performed   Future Appointments  Date Time Provider [place]  04/21/2018  1:30 PM Eileen Stanford, PA-C [facility]  04/21/2018  2:00 PM MC-CV CH ECHO 2 [facility]SITE3ECHO [facility]  06/15/2018  9:00 AM MC-CV NL VASC 4 [facility]SECVI [facility]  06/15/2018 10:00 AM MC-CV NL VASC 4 [facility]SE[facility]I [facility]  07/06/2018 10:30 AM [name], MD [facility] None  03/16/2019  9:00 AM Vicie Mutters, PA-C [facility] None      Subjective:  Robert Schaefer is a 77 y.o. male who presents for follow up  He complains of right ear pain and fullness.   He has had elevated blood pressure since . His blood pressure has been controlled at home, today their BP is BP: 118/72 He does not workout. He denies chest pain, shortness of breath, dizziness.  AS and is s/p TAVR on the 17th, states his SOB is better, did have afib during the admission and was started on Eliquis and he is doing well. Patient assistance forms filled out.     History of PAD s/p  intervention with Dr. [name] to right common iliac and mesenteric.   He has COPD, is ex smoker, quit 10 years ago.   He is not on cholesterol medication and denies myalgias. His cholesterol is not at goal of 70 or less. The cholesterol last visit was:   Lab Results  Component Value Date   CHOL 141 02/26/2017   HDL 50 02/26/2017   LDLCALC 78 02/26/2017   TRIG 58 02/26/2017   CHOLHDL 2.8 02/26/2017    Medication Review: Current Outpatient Medications on File Prior to Visit  Medication Sig Dispense Refill  . albuterol (PROVENTIL HFA;VENTOLIN HFA) 108 (90 Base) MCG/ACT inhaler Inhale 1 puff into the lungs every 6 (six) hours as needed for wheezing or shortness of breath.    . alfuzosin (UROXATRAL) 10 MG 24 hr tablet Take 10 mg by mouth daily. After lunch    . apixaban (ELIQUIS) 2.5 MG TABS tablet Take 1 tablet (2.5 mg total) by mouth 2 (two) times daily. 60 tablet 6  . aspirin EC 81 MG tablet Take 81 mg by mouth daily.    . Cholecalciferol (VITAMIN D-3) 5000 units TABS Take 5,000 Units by mouth daily at 3 pm.    . feeding supplement, ENSURE ENLIVE, (ENSURE ENLIVE) LIQD Take 237 mLs by mouth 3 (three) times daily between meals.    . fluticasone (FLONASE) 50 MCG/ACT nasal spray Place 2 sprays into both nostrils daily as needed for allergies or rhinitis.     [name] levothyroxine (SYNTHROID, LEVOTHROID) 50 MCG tablet Take 50  mcg by mouth daily before breakfast.     No current facility-administered medications on file prior to visit.     Current Problems (verified) Patient Active Problem List   Diagnosis Date Noted  . S/P TAVR (transcatheter aortic valve replacement) 03/16/2018  . Prostate cancer ([place])   . OSA (obstructive sleep apnea)   . Achalasia   . Severe aortic stenosis   . Esophageal stricture   . Cavitating mass in left upper lung lobe 08/12/2016  . PVD (peripheral vascular disease) ([place]) 05/31/2016  . Hypothyroidism 11/07/2014  . BPH w/LUTS 11/07/2014  .  Medication management 04/11/2014  . Hyperlipidemia   . RLS (restless legs syndrome)   . Essential hypertension 09/28/2009  . GERD 09/28/2009    Allergies Allergies  Allergen Reactions  . Penicillins Anaphylaxis, Itching, Swelling and Other (See Comments)    Has patient had a PCN reaction causing immediate rash, facial/tongue/throat swelling, SOB or lightheadedness with hypotension: Yes Has patient had a PCN reaction causing severe rash involving mucus membranes or skin necrosis: No Has patient had a PCN reaction that required hospitalization: Yes Has patient had a PCN reaction occurring within the last 10 years: No If all of the above answers are "NO", then may proceed with Cephalosporin use.   . Sulfa Antibiotics Anaphylaxis and Swelling  . Versed [Midazolam] Other (See Comments)    Confusion, delerium    SURGICAL HISTORY He  has a past surgical history that includes Colonoscopy; Pilonidal cyst / sinus excision (2000); Insertion of mesh (Right, 02/23/2014); Inguinal hernia repair (Right, 02/23/2014); Hernia repair; Cataract extraction w/ intraocular lens  implant, bilateral (Bilateral); Esophagogastroduodenoscopy (egd) with esophageal dilation (last one 11-09-2015  dr Yolanda Bonine); Cardiac catheterization (N/A, 05/29/2016); Prostate biopsy (09-22-2016  dr [name] office); Cardiovascular stress test (04-23-2016   dr berry); transthoracic echocardiogram (05-05-2016   dr berry); Gold seed implant (N/A, 04/29/2017); SPACE OAR INSTILLATION (N/A, 04/29/2017); Upper gastrointestinal endoscopy; Esophageal manometry (N/A, 10/12/2017); Esophagogastroduodenoscopy (N/A, 10/12/2017); Savory dilation (N/A, 10/12/2017); Cardiac catheterization (Bilateral, 01/25/2018); Anal fistulectomy (2010); RIGHT/LEFT HEART CATH AND CORONARY ANGIOGRAPHY (N/A, 01/25/2018); ABDOMINAL AORTOGRAM (N/A, 01/25/2018); and Transcatheter aortic valve replacement, transapical (Left, 03/16/2018). FAMILY HISTORY His family history includes Asthma  in his father; Heart attack in his mother; Stroke in his father. SOCIAL HISTORY He  reports that he quit smoking about 12 years ago. His smoking use included cigarettes. He has a 50.00 pack-year smoking history. He has never used smokeless tobacco. He reports that he does not drink alcohol or use drugs.  Review of Systems  Constitutional: Negative for chills, fever and malaise/fatigue.  HENT: Positive for ear pain and hearing loss. Negative for congestion and sore throat.   Respiratory: Negative for cough, shortness of breath and wheezing.   Cardiovascular: Negative for chest pain, palpitations and leg swelling.  Gastrointestinal: Negative for abdominal pain, blood in stool, constipation, diarrhea, heartburn, melena, nausea and vomiting.  Genitourinary: Negative.   Musculoskeletal: Positive for joint pain and myalgias.  Skin: Negative.   Neurological: Negative for dizziness, sensory change, loss of consciousness and headaches.  Psychiatric/Behavioral: Negative for depression. The patient is not nervous/anxious and does not have insomnia.      Objective:     Today's Vitals   04/12/18 1505  BP: 118/72  Pulse: 69  Resp: 16  Temp: 98.6 F (37 C)  SpO2: 98%  Weight: 133 lb 9.6 oz (60.6 kg)  Height: 5\' 5"  (1.651 m)   Body mass index is 22.23 kg/m.  General appearance: alert, no distress, WD/WN, male  HEENT: normocephalic, sclerae anicteric, TMs pearly, nares patent, no discharge or erythema, pharynx normal. Right ear cerumen removed, TM normal after that.  Oral cavity: MMM, no lesions Neck: supple, no lymphadenopathy, no thyromegaly, no masses Heart: RRR, normal S1, S2, Lungs: CTA bilaterally, no wheezes, rhonchi, or rales Abdomen: +bs, soft, non tender, non distended, no masses, no hepatomegaly, no splenomegaly Musculoskeletal: nontender, no swelling, no obvious deformity Extremities: no edema, no cyanosis, no clubbing Pulses: 2+ symmetric, upper and lower extremities, normal cap  refill Neurological: alert, oriented x 3, CN2-12 intact, strength normal upper extremities and lower extremities, sensation normal throughout, DTRs 2+ throughout, no cerebellar signs, gait Normal Psychiatric: normal affect, behavior normal, pleasant    Vicie Mutters, PA-C   04/12/2018

## 2018-04-12 ENCOUNTER — Encounter: Payer: Self-pay | Admitting: Physician Assistant

## 2018-04-12 ENCOUNTER — Ambulatory Visit (INDEPENDENT_AMBULATORY_CARE_PROVIDER_SITE_OTHER): Payer: Medicare Other | Admitting: Physician Assistant

## 2018-04-12 VITALS — BP 118/72 | HR 69 | Temp 98.6°F | Resp 16 | Ht 65.0 in | Wt 133.6 lb

## 2018-04-12 DIAGNOSIS — H9191 Unspecified hearing loss, right ear: Secondary | ICD-10-CM | POA: Diagnosis not present

## 2018-04-12 DIAGNOSIS — J449 Chronic obstructive pulmonary disease, unspecified: Secondary | ICD-10-CM

## 2018-04-12 DIAGNOSIS — H9201 Otalgia, right ear: Secondary | ICD-10-CM | POA: Diagnosis not present

## 2018-04-12 DIAGNOSIS — I350 Nonrheumatic aortic (valve) stenosis: Secondary | ICD-10-CM | POA: Diagnosis not present

## 2018-04-12 DIAGNOSIS — I739 Peripheral vascular disease, unspecified: Secondary | ICD-10-CM | POA: Diagnosis not present

## 2018-04-12 DIAGNOSIS — I10 Essential (primary) hypertension: Secondary | ICD-10-CM

## 2018-04-12 DIAGNOSIS — I700 Atherosclerosis of aorta: Secondary | ICD-10-CM

## 2018-04-14 NOTE — Telephone Encounter (Signed)
Letter received via fax from [place] stating that they have approved the pt for pt asst with Eliquis. Approval good from 04/13/2018 until 06/29/2018. Application Case: [id number]

## 2018-04-16 DIAGNOSIS — Z48812 Encounter for surgical aftercare following surgery on the circulatory system: Secondary | ICD-10-CM | POA: Diagnosis not present

## 2018-04-16 DIAGNOSIS — I480 Paroxysmal atrial fibrillation: Secondary | ICD-10-CM | POA: Diagnosis not present

## 2018-04-20 NOTE — Progress Notes (Signed)
[facility] [place]                                       Cardiology Office Note    Date:  04/22/2018   ID:  Robert Schaefer, DOB Aug 31, 1940, MRN [id number]  PCP:  [name], MD  Cardiologist: Dr. [name] / Dr. Burt Knack & Dr. Cyndia Bent (TAVR)  CC: 1 month s/p TAVR  History of Present Illness:  Robert Schaefer is a 77 y.o. male with a history of HTN, HLD, hypothyroidism, OSA intolerant to CPAP, COPD, prostate cancer s/p XRT, PAD s/p bilateral common iliac artery stenting, achalasia with weight loss and severe AS s/p TAVR (03/16/18) who presents to clinic for follow up.   Over the past year or so the patient has had significant swallowing difficulty and was diagnosed with achalasia. He has lost about 50 pounds over the past year. He was seen by Dr. Johney Maine and was felt that he would require surgical treatment but required cardiac clearance. He was evaluated by Dr. [name] and had an echocardiogram on 01/01/2018 which showed a severely calcified aortic valve with a mean gradient of 59 mmHg and a peak gradient of 120 mmHg. Dimensionless index was 0.23. Left ventricular ejection fraction was 60 to 65% with grade 1 diastolic dysfunction. He underwent cardiac catheterization on 01/25/2018 which showed no significant coronary disease. The aortic valve gradient was 50 mmHg. Aortic valve area was 0.55 cm. LVEDP was 23. PA pressure was 37/14 with a mean wedge pressure of 13. Bilateral iliac artery stents were patent.  He underwent successful TAVR with a48mm Edwards Sapien 3 THV via the transapicalapproach on 03/16/17. Post operative echoshoweda EF 60% w/ a normally functioning TAVR valve (gradients not reported). Initial ECG showed new LBBB with 1st degree AV block, which resolved and then returned. His admission was prolonged due to chronic dementia with delirium. The patient was noted to go into afib during his admission, which is a new diagnosis for him.He  spontaneously converted back to sinus. He was started on low dose Eliquis 2.5 mg BID for thromboembolic prophlaxis.   Today he presents to clinic for follow up. He is doing quite well. No CP or SOB. No LE edema, orthopnea or PND. No dizziness or syncope. No blood in stool or urine. No palpitations. He is able to walk with his walker down the driveway and back up with no issues. He does get some fatigue at least once a day. He gets short of breath with moderate exertion but no issues with daily activities. He works with [facility] PT once a week. He is feeling much better since having his TAVR.    Past Medical History:  Diagnosis Date  . AAA (abdominal aortic aneurysm) ([place])   . Achalasia   . Carotid stenosis, asymptomatic, bilateral    per duplex --- bilateral <50% ICA  . COPD (chronic obstructive pulmonary disease) ([place])    pulmologist-  dr [name]  . GERD (gastroesophageal reflux disease)   . Hyperlipidemia   . Hypertension   . Hypothyroidism   . Lesion of left lung    followed by dr [name] (pulmologist)  post infection scarring  . Mild dementia ([place])   . OSA (obstructive sleep apnea)    has a cpap-  intolerant; due to Claustaphobia (01/25/2018)  . Peripheral vascular disease [facility])    cardiologist-- dr berry--  per last ABI/Aorta/Iliac 06-09-2016  patent bilateral iliac kissing stents, >50% stenosis bilateral iliac artery (R>L) and left external iliac artery, patent IVC  . Prostate cancer [facility])    urologist-  dr [name] oncologist-  dr [name]. Dx'd 2018 s/p XRT  . RLS (restless legs syndrome)   . S/P TAVR (transcatheter aortic valve replacement)    s/p TAVR via transapical approach with an Edwards Sapien 3 THV (size 26 mm, model # 9600TFX, serial # [id number])  . Schatzki's ring   . Severe aortic stenosis     Past Surgical History:  Procedure Laterality Date  . ABDOMINAL AORTOGRAM N/A 01/25/2018   Procedure: ABDOMINAL AORTOGRAM;  Surgeon: [name], MD;  Location: [place] [facility];   Service: Cardiovascular;  Laterality: N/A;  . ANAL FISTULECTOMY  2010  . CARDIAC CATHETERIZATION Bilateral 01/25/2018  . CARDIOVASCULAR STRESS TEST  04-23-2016   dr berry   normal nuclear study w/ no ischemia/  normal LV function and wall function, nuclear stress ef 57%  . CATARACT EXTRACTION W/ INTRAOCULAR LENS  IMPLANT, BILATERAL Bilateral   . COLONOSCOPY    . ESOPHAGEAL MANOMETRY N/A 10/12/2017   Procedure: ESOPHAGEAL MANOMETRY (EM);  Surgeon: [name], MD;  Location: [facility];  Service: Gastroenterology;  Laterality: N/A;  . ESOPHAGOGASTRODUODENOSCOPY N/A 10/12/2017   Procedure: ESOPHAGOGASTRODUODENOSCOPY (EGD);  Surgeon: [name], MD;  Location: [facility];  Service: Gastroenterology;  Laterality: N/A;  . ESOPHAGOGASTRODUODENOSCOPY (EGD) WITH ESOPHAGEAL DILATION  last one 11-09-2015  dr Yolanda Bonine   dilation schatzki's ring  . GOLD SEED IMPLANT N/A 04/29/2017   Procedure: GOLD SEED IMPLANT;  Surgeon: [name], MD;  [facility];  Service: Urology;  Laterality: N/A;  . HERNIA REPAIR    . INGUINAL HERNIA REPAIR Right 02/23/2014   Procedure: HERNIA REPAIR INGUINAL ADULT;  Surgeon: Joyice Faster. Cornett, MD;  Location: [place];  Service: General;  Laterality: Right;  . INSERTION OF MESH Right 02/23/2014   Procedure: INSERTION OF MESH;  Surgeon: Joyice Faster. Cornett, MD;  Location: [place];  Service: General;  Laterality: Right;  . PERIPHERAL VASCULAR CATHETERIZATION N/A 05/29/2016   Procedure: Lower Extremity Angiography;  Surgeon: [name], MD;  Location: [place] [facility];  Service: Cardiovascular;  Laterality: N/A; [place] orbital rotational atherectomy, PTA and stenting bilateral common iliac arteries   . PILONIDAL CYST / SINUS EXCISION  2000  . PROSTATE BIOPSY  09-22-2016  dr [name] office  . RIGHT/LEFT HEART CATH AND CORONARY ANGIOGRAPHY N/A 01/25/2018   Procedure: RIGHT/LEFT HEART CATH AND  CORONARY ANGIOGRAPHY;  Surgeon: [name], MD;  Location: [place] [facility];  Service: Cardiovascular;  Laterality: N/A;  . SAVORY DILATION N/A 10/12/2017   Procedure: SAVORY DILATION;  Surgeon: [name], MD;  Location: [facility];  Service: Gastroenterology;  Laterality: N/A;  . SPACE OAR INSTILLATION N/A 04/29/2017   Procedure: SPACE OAR INSTILLATION;  Surgeon: [name], MD;  Location[facility];  Service: Urology;  Laterality: N/A;  . TRANSCATHETER AORTIC VALVE REPLACEMENT, TRANSAPICAL Left 03/16/2018   Procedure: TRANSCATHETER AORTIC VALVE REPLACEMENT, TRANSAPICAL;  Surgeon: Sherren Mocha, MD;  Location: [place];  Service: Open Heart Surgery;  Laterality: Left;  . TRANSTHORACIC ECHOCARDIOGRAM  05-05-2016   dr berry   ef 46-65%,  grade 1diastolic dysfunction/  moderate AV stenosis with mod. AR (right coronary and noncoronary cusp mobility seversly restricted);  valve area 1.25cm^2, mean gradient 27 mmHg, peak gradient 57mmHg/  mild  MR  . UPPER GASTROINTESTINAL ENDOSCOPY      Current Medications: Outpatient Medications Prior to Visit  Medication Sig Dispense Refill  . albuterol (PROVENTIL HFA;VENTOLIN HFA) 108 (90 Base) MCG/ACT inhaler Inhale 1 puff into the lungs every 6 (six) hours as needed for wheezing or shortness of breath.    . alfuzosin (UROXATRAL) 10 MG 24 hr tablet Take 10 mg by mouth daily. After lunch    . apixaban (ELIQUIS) 2.5 MG TABS tablet Take 1 tablet (2.5 mg total) by mouth 2 (two) times daily. 60 tablet 6  . aspirin EC 81 MG tablet Take 81 mg by mouth daily.    . Cholecalciferol (VITAMIN D-3) 5000 units TABS Take 5,000 Units by mouth daily at 3 pm.    . feeding supplement, ENSURE ENLIVE, (ENSURE ENLIVE) LIQD Take 237 mLs by mouth 3 (three) times daily between meals.    . fluticasone (FLONASE) 50 MCG/ACT nasal spray Place 2 sprays into both nostrils daily as needed for allergies or rhinitis.     [name] levothyroxine (SYNTHROID,  LEVOTHROID) 50 MCG tablet Take 50 mcg by mouth daily before breakfast.     No facility-administered medications prior to visit.      Allergies:   Penicillins; Sulfa antibiotics; and Versed [midazolam]   Social History   Socioeconomic History  . Marital status: Married    Spouse name: Not on file  . Number of children: Not on file  . Years of education: Not on file  . Highest education level: Not on file  Occupational History  . Not on file  Social Needs  . Financial resource strain: Not on file  . Food insecurity:    Worry: Not on file    Inability: Not on file  . Transportation needs:    Medical: Not on file    Non-medical: Not on file  Tobacco Use  . Smoking status: Former Smoker    Packs/day: 1.00    Years: 50.00    Pack years: 50.00    Types: Cigarettes    Last attempt to quit: 07/05/2005    Years since quitting: 12.8  . Smokeless tobacco: Never Used  Substance and Sexual Activity  . Alcohol use: No  . Drug use: Never  . Sexual activity: Not Currently  Lifestyle  . Physical activity:    Days per week: Not on file    Minutes per session: Not on file  . Stress: Not on file  Relationships  . Social connections:    Talks on phone: Not on file    Gets together: Not on file    Attends religious service: Not on file    Active member of club or organization: Not on file    Attends meetings of clubs or organizations: Not on file    Relationship status: Not on file  Other Topics Concern  . Not on file  Social History Narrative  . Not on file     Family History:  The patient's family history includes Asthma in his father; Heart attack in his mother; Stroke in his father.      ROS:   Please see the history of present illness.    ROS All other systems reviewed and are negative.   PHYSICAL EXAM:   VS:  BP 130/70   Pulse 85   Ht 5\' 5"  (1.651 m)   Wt 132 lb (59.9 kg)   BMI 21.97 kg/m    GEN: Well nourished, well developed, in no acute distress HEENT:  normal  Neck: no JVD or masses Cardiac: RRR; no murmurs, rubs, or gallops,no edema  Respiratory:  clear to auscultation bilaterally, normal work of breathing GI: soft, nontender, nondistended, + BS MS: no deformity or atrophy Skin: warm and dry, no rash Neuro:  Alert and Oriented x 3, Strength and sensation are intact Psych: euthymic mood, full affect   Wt Readings from Last 3 Encounters:  04/21/18 132 lb (59.9 kg)  04/12/18 133 lb 9.6 oz (60.6 kg)  03/31/18 133 lb 6.4 oz (60.5 kg)      Studies/Labs Reviewed:   EKG:  EKG is ordered today.  The ekg ordered today demonstrates  Sinus with 1st deg AV block and diffuse TWIs in inferior and anterolateral leads.   Recent Labs: 03/11/2018: ALT 12; B Natriuretic Peptide 164.1 03/17/2018: Magnesium 1.9 03/21/2018: BUN 12; Creatinine, Ser 0.71; Hemoglobin 10.3; Platelets 104; Potassium 3.7; Sodium 140   Lipid Panel    Component Value Date/Time   CHOL 141 02/26/2017 1046   TRIG 58 02/26/2017 1046   HDL 50 02/26/2017 1046   CHOLHDL 2.8 02/26/2017 1046   VLDL 19 09/15/2016 1729   LDLCALC 78 02/26/2017 1046    Additional studies/ records that were reviewed today include:  [place] NOTE  Date of Procedure:03/16/2018  Preoperative Diagnosis:Severe Aortic Stenosis  Procedure:   Transcatheter Aortic Valve Replacement - Transapical Approach Edwards Sapien 3THV (size 82mm, model # 9600TFX, serial #[id number])  Co-Surgeons:Bryan Alveria Apley, MD and Sherren Mocha, MD   Pre-operative Echo Findings: ? severe aortic stenosis ? normalleft ventricular systolic function   Post-operative Echo Findings: ? noparavalvular leak ? normalleft ventricular systolic function   __________   Post operative echo 03/17/18 Study Conclusions - Left ventricle: The cavity size was normal. Wall thickness was normal. Systolic function was normal. The  estimated ejection fraction was in the range of 60% to 65%. Wall motion was normal; there were no regional wall motion abnormalities. - Aortic valve: A bioprosthesis was present.   ____________   Echo 04/21/18 ( 1 month s/p TAVR) Study Conclusions - Left ventricle: The cavity size was normal. Systolic function was   normal. The estimated ejection fraction was in the range of 55%   to 60%. Wall motion was normal; there were no regional wall   motion abnormalities. There was an increased relative   contribution of atrial contraction to ventricular filling.   Doppler parameters are consistent with abnormal left ventricular   relaxation (grade 1 diastolic dysfunction). - Aortic valve: S/P 41mm Edwards Sapien 3 bioprosthetic AV well   seated with normal function. There is mild perivavlvular AI. Mean   gradient (S): 11 mm Hg.   ASSESSMENT & PLAN:   Severe AS s/p TAVR: echo today shows EF 55%, normally functioning TAVR with mean gradient of 11 mm HG and mild PVL. He has NYHA class II symptoms, mainly of fatigue, but he is much improved since having TAVR. SBE prophylaxis discussed; he has full dentures that were just refit and does not visit the dentist. ASA can be discontinued after 6 months of therapy (08/2018). He will continue on eliquis 2.5mg  BID.  Paroxysmalatrial fibrillation: maintaining sinus by ECG. Continue Eliquis 2.5mg  BID. He was approved for patient assistance for this.  HTN: BP well controlled today   COPD: stable.   Achalasia: stable.   Medication Adjustments/Labs and Tests Ordered: Current medicines are reviewed at length with the patient today.  Concerns regarding medicines are outlined above.  Medication changes, Labs and Tests ordered today are  listed in the Patient Instructions below. Patient Instructions  Medication Instructions:  You may STOP ASPIRIN on September 14, 2018 (unless instructed by a doctor NOT to stop).  Labwork: None    Testing/Procedures: Your provider has requested that you have an echocardiogram in 1 YEAR. Echocardiography is a painless test that uses sound waves to create images of your heart. It provides your doctor with information about the size and shape of your heart and how well your heart's chambers and valves are working. This procedure takes approximately one hour. There are no restrictions for this procedure.  Follow-Up: You have an appointment with Dr. [name] directly after your testing on December 17.  You will be called to arrange your 1 year TAVR echo and office visit when the schedule opens.     Signed, Angelena Form, PA-C  04/22/2018 9:52 AM    [facility] [address], [place], [place]  [address] Phone: [phone]; Fax: [phone]

## 2018-04-21 ENCOUNTER — Other Ambulatory Visit: Payer: Self-pay

## 2018-04-21 ENCOUNTER — Ambulatory Visit (HOSPITAL_COMMUNITY): Payer: Medicare Other | Attending: Physician Assistant

## 2018-04-21 ENCOUNTER — Encounter: Payer: Self-pay | Admitting: Physician Assistant

## 2018-04-21 ENCOUNTER — Other Ambulatory Visit (HOSPITAL_COMMUNITY): Payer: Medicare Other

## 2018-04-21 ENCOUNTER — Ambulatory Visit (INDEPENDENT_AMBULATORY_CARE_PROVIDER_SITE_OTHER): Payer: Medicare Other | Admitting: Physician Assistant

## 2018-04-21 VITALS — BP 130/70 | HR 85 | Ht 65.0 in | Wt 132.0 lb

## 2018-04-21 DIAGNOSIS — Z952 Presence of prosthetic heart valve: Secondary | ICD-10-CM

## 2018-04-21 DIAGNOSIS — I10 Essential (primary) hypertension: Secondary | ICD-10-CM

## 2018-04-21 DIAGNOSIS — J449 Chronic obstructive pulmonary disease, unspecified: Secondary | ICD-10-CM

## 2018-04-21 DIAGNOSIS — I480 Paroxysmal atrial fibrillation: Secondary | ICD-10-CM

## 2018-04-21 DIAGNOSIS — K220 Achalasia of cardia: Secondary | ICD-10-CM

## 2018-04-21 LAB — ECHOCARDIOGRAM COMPLETE
Height: 65 in
Weight: 2112 oz

## 2018-04-21 NOTE — Patient Instructions (Addendum)
Medication Instructions:  You may STOP ASPIRIN on September 14, 2018 (unless instructed by a doctor NOT to stop).  Labwork: None   Testing/Procedures: Your provider has requested that you have an echocardiogram in 1 YEAR. Echocardiography is a painless test that uses sound waves to create images of your heart. It provides your doctor with information about the size and shape of your heart and how well your heart's chambers and valves are working. This procedure takes approximately one hour. There are no restrictions for this procedure.  Follow-Up: You have an appointment with Dr. [name] directly after your testing on December 17.  You will be called to arrange your 1 year TAVR echo and office visit when the schedule opens.

## 2018-04-22 DIAGNOSIS — Z48812 Encounter for surgical aftercare following surgery on the circulatory system: Secondary | ICD-10-CM | POA: Diagnosis not present

## 2018-04-22 DIAGNOSIS — I480 Paroxysmal atrial fibrillation: Secondary | ICD-10-CM | POA: Diagnosis not present

## 2018-04-23 ENCOUNTER — Encounter: Payer: Self-pay | Admitting: Thoracic Surgery (Cardiothoracic Vascular Surgery)

## 2018-04-26 DIAGNOSIS — Z48812 Encounter for surgical aftercare following surgery on the circulatory system: Secondary | ICD-10-CM | POA: Diagnosis not present

## 2018-04-26 DIAGNOSIS — I480 Paroxysmal atrial fibrillation: Secondary | ICD-10-CM | POA: Diagnosis not present

## 2018-04-30 DIAGNOSIS — I480 Paroxysmal atrial fibrillation: Secondary | ICD-10-CM | POA: Diagnosis not present

## 2018-04-30 DIAGNOSIS — Z48812 Encounter for surgical aftercare following surgery on the circulatory system: Secondary | ICD-10-CM | POA: Diagnosis not present

## 2018-05-03 DIAGNOSIS — Z48812 Encounter for surgical aftercare following surgery on the circulatory system: Secondary | ICD-10-CM | POA: Diagnosis not present

## 2018-05-03 DIAGNOSIS — I480 Paroxysmal atrial fibrillation: Secondary | ICD-10-CM | POA: Diagnosis not present

## 2018-05-07 DIAGNOSIS — I480 Paroxysmal atrial fibrillation: Secondary | ICD-10-CM | POA: Diagnosis not present

## 2018-05-07 DIAGNOSIS — Z48812 Encounter for surgical aftercare following surgery on the circulatory system: Secondary | ICD-10-CM | POA: Diagnosis not present

## 2018-05-10 DIAGNOSIS — I480 Paroxysmal atrial fibrillation: Secondary | ICD-10-CM | POA: Diagnosis not present

## 2018-05-10 DIAGNOSIS — Z48812 Encounter for surgical aftercare following surgery on the circulatory system: Secondary | ICD-10-CM | POA: Diagnosis not present

## 2018-05-11 DIAGNOSIS — C61 Malignant neoplasm of prostate: Secondary | ICD-10-CM | POA: Diagnosis not present

## 2018-05-13 DIAGNOSIS — I480 Paroxysmal atrial fibrillation: Secondary | ICD-10-CM | POA: Diagnosis not present

## 2018-05-13 DIAGNOSIS — Z48812 Encounter for surgical aftercare following surgery on the circulatory system: Secondary | ICD-10-CM | POA: Diagnosis not present

## 2018-05-18 ENCOUNTER — Ambulatory Visit: Payer: Medicare Other | Admitting: Cardiovascular Disease

## 2018-05-19 DIAGNOSIS — I480 Paroxysmal atrial fibrillation: Secondary | ICD-10-CM | POA: Diagnosis not present

## 2018-05-19 DIAGNOSIS — Z48812 Encounter for surgical aftercare following surgery on the circulatory system: Secondary | ICD-10-CM | POA: Diagnosis not present

## 2018-05-24 DIAGNOSIS — R351 Nocturia: Secondary | ICD-10-CM | POA: Diagnosis not present

## 2018-05-24 DIAGNOSIS — C61 Malignant neoplasm of prostate: Secondary | ICD-10-CM | POA: Diagnosis not present

## 2018-05-25 ENCOUNTER — Telehealth: Payer: Self-pay

## 2018-05-25 NOTE — Telephone Encounter (Signed)
**Note De-Identified  Obfuscation** The pt brought in his [employer] pt asst application for Eliquis. I have completed the providers part and placed it in Dr York Cerise mail bin awaiting his signature.

## 2018-06-03 NOTE — Telephone Encounter (Signed)
I faxed completed Robert Schaefer patient assistance forms for [facility] along with signed provider part.

## 2018-06-15 ENCOUNTER — Ambulatory Visit (HOSPITAL_COMMUNITY)
Admission: RE | Admit: 2018-06-15 | Discharge: 2018-06-15 | Disposition: A | Discharge status: Home / Self Care | Payer: Medicare Other | Source: Ambulatory Visit | Attending: Cardiology | Admitting: Cardiology

## 2018-06-15 ENCOUNTER — Encounter: Payer: Self-pay | Admitting: Cardiovascular Disease

## 2018-06-15 ENCOUNTER — Ambulatory Visit (HOSPITAL_BASED_OUTPATIENT_CLINIC_OR_DEPARTMENT_OTHER)
Admission: RE | Admit: 2018-06-15 | Discharge: 2018-06-15 | Disposition: A | Discharge status: Home / Self Care | Payer: Medicare Other | Source: Ambulatory Visit | Attending: Cardiovascular Disease | Admitting: Cardiovascular Disease

## 2018-06-15 ENCOUNTER — Ambulatory Visit (INDEPENDENT_AMBULATORY_CARE_PROVIDER_SITE_OTHER): Payer: Medicare Other | Admitting: Cardiovascular Disease

## 2018-06-15 ENCOUNTER — Other Ambulatory Visit: Payer: Self-pay | Admitting: *Deleted

## 2018-06-15 DIAGNOSIS — I480 Paroxysmal atrial fibrillation: Secondary | ICD-10-CM | POA: Insufficient documentation

## 2018-06-15 DIAGNOSIS — I739 Peripheral vascular disease, unspecified: Secondary | ICD-10-CM

## 2018-06-15 DIAGNOSIS — E7800 Pure hypercholesterolemia, unspecified: Secondary | ICD-10-CM

## 2018-06-15 DIAGNOSIS — Z952 Presence of prosthetic heart valve: Secondary | ICD-10-CM

## 2018-06-15 DIAGNOSIS — I10 Essential (primary) hypertension: Secondary | ICD-10-CM

## 2018-06-15 NOTE — Assessment & Plan Note (Signed)
History of hyperlipidemia not on statin therapy with lipid profile performed 02/26/2017 revealing total cholesterol 141, LDL 78 and HDL 50

## 2018-06-15 NOTE — Addendum Note (Signed)
Addended by: Cristopher Estimable on: 06/15/2018 12:02 PM   Modules accepted: Orders

## 2018-06-15 NOTE — Progress Notes (Signed)
06/15/2018 Robert Schaefer   1940/12/04  [id number]  Primary Physician [name], MD Primary Cardiologist: [name] MD Lupe Carney, [place]  HPI:  Robert Schaefer is a 77 y.o.  married Caucasian male father of 2 children, grandfather 5 grandchildren who is covering by his wife [name] and daughter [name]. He was referred by Dr. Melford Aase for cardiovascular evaluation because of chest pain and claudication. I last saw him in the office  01/25/2018. His cardiovascular risk factor profile is notable for 100 pack years of tobacco abuse having quit 10 years ago. He has treated hypertension and hyperlipidemia. There is a family history of heart disease with a mother who died of a myocardial infarction age 53. He has never had a heart attack or stroke. He has had recent chest pain with a CT angiogram that ruled out pulmonary embolus but did show diffuse coronary calcification. An abdominal pelvic CT performed because of unexplained weight loss showed aortoiliac calcification size/atherosclerosis as well as atherosclerosis of the superior mesenteric artery and left renal artery. He complains of right greater than leftleg claudication of the last year occurring after walking 4-5 minutes. Since I saw him in the office he's had a Myoview stress test which was nonischemic problem, a 2-D echo that showed normal LV function with moderate aortic stenosis, carotid Dopplers that showed no evidence of ICA stenosis and lower extremity Dopplers reveal a right ABI of 0.48 with an occluded right common iliac and a left ABI of 1.23 with moderate left common iliac artery stenosis. He also complains a 30 pound weight loss of and had SMA calcification on abdominal CT. He may have mesenteric ischemia as well.He underwent peripheral angiography and intervention by myself 05/29/16 demonstrating a small abdominal aortic aneurysm, 95% calcified ostial superior mesenteric artery stenosis, right common iliac chronic  total occlusion was calcified and high-grade common iliac artery stenosis. He underwent diamondback orbital rotational atherectomy, PTA and covered stenting using Lifestream covered stents" of both common iliac arteries using "kissing stent technique. His claudication symptoms have resolved. The ABIs are normal. She does have a high-frequency signal in his right common iliac artery demonstrated a duplex ultrasound 06/09/16 was asymptomatic from this.  Since I did his heart cath 01/25/2018 which revealed critical left ear, normal coronary arteries and widely patent iliac stents he underwent a transapical TAVR procedure by Drs. Cooper and Monon with an excellent clinical result.  His follow-up echo revealed a normally functioning bioprosthetic aortic prosthesis, normal LV function.  He is clinically improved.  He was noted to be in PAF transiently during his hospitalization converting to sinus rhythm and he was begun on low-dose Eliquis.   Current Meds  Medication Sig  . albuterol (PROVENTIL HFA;VENTOLIN HFA) 108 (90 Base) MCG/ACT inhaler Inhale 1 puff into the lungs every 6 (six) hours as needed for wheezing or shortness of breath.  . alfuzosin (UROXATRAL) 10 MG 24 hr tablet Take 10 mg by mouth daily. After lunch  . apixaban (ELIQUIS) 2.5 MG TABS tablet Take 1 tablet (2.5 mg total) by mouth 2 (two) times daily.  [name] aspirin EC 81 MG tablet Take 81 mg by mouth daily.  . Cholecalciferol (VITAMIN D-3) 5000 units TABS Take 5,000 Units by mouth daily at 3 pm.  . feeding supplement, ENSURE ENLIVE, (ENSURE ENLIVE) LIQD Take 237 mLs by mouth 3 (three) times daily between meals.  . fluticasone (FLONASE) 50 MCG/ACT nasal spray Place 2 sprays into both nostrils daily as needed for  allergies or rhinitis.   [name] levothyroxine (SYNTHROID, LEVOTHROID) 50 MCG tablet Take 50 mcg by mouth daily before breakfast.     Allergies  Allergen Reactions  . Penicillins Anaphylaxis, Itching, Swelling and Other (See Comments)     Has patient had a PCN reaction causing immediate rash, facial/tongue/throat swelling, SOB or lightheadedness with hypotension: Yes Has patient had a PCN reaction causing severe rash involving mucus membranes or skin necrosis: No Has patient had a PCN reaction that required hospitalization: Yes Has patient had a PCN reaction occurring within the last 10 years: No If all of the above answers are "NO", then may proceed with Cephalosporin use.   . Sulfa Antibiotics Anaphylaxis and Swelling  . Versed [Midazolam] Other (See Comments)    Confusion, delerium    Social History   Socioeconomic History  . Marital status: Married    Spouse name: Not on file  . Number of children: Not on file  . Years of education: Not on file  . Highest education level: Not on file  Occupational History  . Not on file  Social Needs  . Financial resource strain: Not on file  . Food insecurity:    Worry: Not on file    Inability: Not on file  . Transportation needs:    Medical: Not on file    Non-medical: Not on file  Tobacco Use  . Smoking status: Former Smoker    Packs/day: 1.00    Years: 50.00    Pack years: 50.00    Types: Cigarettes    Last attempt to quit: 07/05/2005    Years since quitting: 12.9  . Smokeless tobacco: Never Used  Substance and Sexual Activity  . Alcohol use: No  . Drug use: Never  . Sexual activity: Not Currently  Lifestyle  . Physical activity:    Days per week: Not on file    Minutes per session: Not on file  . Stress: Not on file  Relationships  . Social connections:    Talks on phone: Not on file    Gets together: Not on file    Attends religious service: Not on file    Active member of club or organization: Not on file    Attends meetings of clubs or organizations: Not on file    Relationship status: Not on file  . Intimate partner violence:    Fear of current or ex partner: Not on file    Emotionally abused: Not on file    Physically abused: Not on file     Forced sexual activity: Not on file  Other Topics Concern  . Not on file  Social History Narrative  . Not on file     Review of Systems: General: negative for chills, fever, night sweats or weight changes.  Cardiovascular: negative for chest pain, dyspnea on exertion, edema, orthopnea, palpitations, paroxysmal nocturnal dyspnea or shortness of breath Dermatological: negative for rash Respiratory: negative for cough or wheezing Urologic: negative for hematuria Abdominal: negative for nausea, vomiting, diarrhea, bright red blood per rectum, melena, or hematemesis Neurologic: negative for visual changes, syncope, or dizziness All other systems reviewed and are otherwise negative except as noted above.    Blood pressure 110/60, pulse 60, height 5\' 5"  (1.651 m), weight 133 lb (60.3 kg).  General appearance: alert and no distress Neck: no adenopathy, no carotid bruit, no JVD, supple, symmetrical, trachea midline and thyroid not enlarged, symmetric, no tenderness/mass/nodules Lungs: clear to auscultation bilaterally Heart: regular rate and rhythm, S1, S2  normal, no murmur, click, rub or gallop Extremities: extremities normal, atraumatic, no cyanosis or edema Pulses: 2+ and symmetric Skin: Skin color, texture, turgor normal. No rashes or lesions Neurologic: Alert and oriented X 3, normal strength and tone. Normal symmetric reflexes. Normal coordination and gait  EKG not performed today  ASSESSMENT AND PLAN:   Essential hypertension History of essential hypertension her blood pressure measured today at 110/60.  He is on no antihypertensive medications.  Hyperlipidemia History of hyperlipidemia not on statin therapy with lipid profile performed 02/26/2017 revealing total cholesterol 141, LDL 78 and HDL 50  PVD (peripheral vascular disease) ([facility]) History of peripheral arterial disease with a known small abdominal aortic aneurysm status post bilateral iliac stenting by myself 05/29/2016.   He had a right common iliac artery CTO and high-grade left common iliac artery stenosis.  He also had a 7080% calcified mid right SFA stenosis with three-vessel runoff bilaterally.  In addition, I demonstrated a 95% calcified ostial superior mesenteric artery stenosis.  Recent lower extractions extremity Dopplers performed 06/15/2018 revealed ABIs that were normal bilaterally with patent stents.  His abdominal aorta measured 3.4 x 3.3 cm.  S/P TAVR (transcatheter aortic valve replacement) History of severe aortic stenosis status post transapical TAVR by Drs. Cooper and Baxter on 03/16/2018 with an excellent clinical result.  He no longer shortness of breath and his recent 2D echo revealed a normally functioning valve with normal LV function.  Paroxysmal atrial fibrillation (HCC) History of PAF converted to sinus rhythm during his recent hospitalization.  He was started on low-dose Eliquis.      [name] MD FACP,FACC,FAHA, [facility] 06/15/2018 11:54 AM

## 2018-06-15 NOTE — Assessment & Plan Note (Signed)
History of severe aortic stenosis status post transapical TAVR by Drs. Cooper and Newberry on 03/16/2018 with an excellent clinical result.  He no longer shortness of breath and his recent 2D echo revealed a normally functioning valve with normal LV function.

## 2018-06-15 NOTE — Patient Instructions (Incomplete)
Medication Instructions:  NO CHANGE If you need a refill on your cardiac medications before your next appointment, please call your pharmacy.   Lab work: If you have labs (blood work) drawn today and your tests are completely normal, you will receive your results only by: [name] MyChart Message (if you have MyChart) OR . A paper copy in the mail If you have any lab test that is abnormal or we need to change your treatment, we will call you to review the results.  Testing/Procedures: Your physician has requested that you have an echocardiogram. Echocardiography is a painless test that uses sound waves to create images of your heart. It provides your doctor with information about the size and shape of your heart and how well your heart's chambers and valves are working. This procedure takes approximately one hour. There are no restrictions for this procedure.SCHEDULE IN 1 YEAR  Your physician has requested that you have an aorta/iliac duplex. During this test, an ultrasound is used to evaluate blood flow to the aorta and iliac arteries. Allow one hour for this exam. Do not eat after midnight the day before and avoid carbonated beverages.   SCHEDULE IN 1 YEAR    Follow-Up: At [facility], you and your health needs are our priority.  As part of our continuing mission to provide you with exceptional heart care, we have created designated Provider Care Teams.  These Care Teams include your primary Cardiologist (physician) and Advanced Practice Providers (APPs -  Physician Assistants and Nurse Practitioners) who all work together to provide you with the care you need, when you need it. You will need a follow up appointment in 12 months.  Please call our office 2 months in advance to schedule this appointment.  You may see Quay Burow, MD or one of the following Advanced Practice Providers on your designated Care Team:   Kerin Ransom, PA-C Roby Lofts, [place] . Sande Rives, PA-C  Any Other  Special Instructions Will Be Listed Below (If Applicable). ***

## 2018-06-15 NOTE — Assessment & Plan Note (Signed)
History of PAF converted to sinus rhythm during his recent hospitalization.  He was started on low-dose Eliquis.

## 2018-06-15 NOTE — Assessment & Plan Note (Signed)
History of peripheral arterial disease with a known small abdominal aortic aneurysm status post bilateral iliac stenting by myself 05/29/2016.  He had a right common iliac artery CTO and high-grade left common iliac artery stenosis.  He also had a 7080% calcified mid right SFA stenosis with three-vessel runoff bilaterally.  In addition, I demonstrated a 95% calcified ostial superior mesenteric artery stenosis.  Recent lower extractions extremity Dopplers performed 06/15/2018 revealed ABIs that were normal bilaterally with patent stents.  His abdominal aorta measured 3.4 x 3.3 cm.

## 2018-06-15 NOTE — Assessment & Plan Note (Signed)
History of essential hypertension her blood pressure measured today at 110/60.  He is on no antihypertensive medications.

## 2018-06-15 NOTE — Progress Notes (Signed)
VAS 

## 2018-06-22 ENCOUNTER — Other Ambulatory Visit: Payer: Self-pay | Admitting: *Deleted

## 2018-06-22 DIAGNOSIS — I739 Peripheral vascular disease, unspecified: Secondary | ICD-10-CM

## 2018-06-25 ENCOUNTER — Other Ambulatory Visit: Payer: Self-pay | Admitting: *Deleted

## 2018-06-25 DIAGNOSIS — I739 Peripheral vascular disease, unspecified: Secondary | ICD-10-CM

## 2018-07-05 ENCOUNTER — Encounter: Payer: Self-pay | Admitting: Internal Medicine

## 2018-07-05 NOTE — Progress Notes (Signed)
C  A  N  C  E  L  E  D    L  A  C  K      Of    T  R  A  N  S  P O  R  T  A  T  I  O  N                        This very nice 78 y.o.male presents for 3 month follow up with HTN, HLD, Hypothyroidism, Hx/o Prostate CA (Oct 2018 / Dr [name]),  Pre-Diabetes and Vitamin D Deficiency. He has COPD overlap w/ OSA and is mask intolerant. He's been followed in the past by Dr [name] for a LUL cavitary lesion decreasing in size & felt post-infectious.      Patient is treated for HTN (1995) & BP has been controlled at home. Today's  .  P)atient had Bilat AoIliac Arthrotomy by Dr [name] in Nov 2017 & is also has  Lt Renal Aa Stenosis and Mesenteric Aa Stenosis. In Sept 2019, he underwent TAVR & had po pAfib & was initiated on Eliquis. Patient has had no complaints of any cardiac type chest pain, palpitations, dyspnea / orthopnea / PND, dizziness, claudication, or dependent edema.     Hyperlipidemia is controlled with diet & meds. Patient denies myalgias or other med SE's. Last Lipids were at goal: Lab Results  Component Value Date   CHOL 141 02/26/2017   HDL 50 02/26/2017   LDLCALC 78 02/26/2017   TRIG 58 02/26/2017   CHOLHDL 2.8 02/26/2017      Also, the patient has history of PreDiabetes (A1c 6.1% / 2012 &  5.6% / 2016)  and  has had no symptoms of reactive hypoglycemia, diabetic polys, paresthesias or  visual blurring.  Last A1c was Normal & at goal:  Lab Results  Component Value Date   HGBA1C 4.9 03/11/2018      Patient was initiated omn Thyroid Replacement in 2014.      Further, the patient also has history of Vitamin D Deficiency and supplements vitamin D without any suspected side-effects. Last vitamin D was   Lab Results  Component Value Date   VD25OH 86 09/15/2016

## 2018-07-06 ENCOUNTER — Ambulatory Visit: Payer: Self-pay | Admitting: Internal Medicine

## 2018-07-12 ENCOUNTER — Telehealth: Payer: Self-pay

## 2018-07-12 NOTE — Telephone Encounter (Signed)
The pts daughter states that she has called [employer] and was advised that the pt application has not been processed yet and that it could take up to 10 days for it to be processed. She states that the pt has enough Eliquis to last until this Saturday and requested samples. I have advised her that Ive left him 2 boxes of Eliquis samples in the front office for them to pick up. She verbalized understanding and thanked me for my help.

## 2018-07-20 ENCOUNTER — Encounter: Payer: Self-pay | Admitting: Gastroenterology

## 2018-07-20 ENCOUNTER — Ambulatory Visit (INDEPENDENT_AMBULATORY_CARE_PROVIDER_SITE_OTHER): Payer: Medicare Other | Admitting: Gastroenterology

## 2018-07-20 VITALS — BP 112/60 | HR 70 | Ht 65.0 in | Wt 134.0 lb

## 2018-07-20 DIAGNOSIS — K219 Gastro-esophageal reflux disease without esophagitis: Secondary | ICD-10-CM

## 2018-07-20 DIAGNOSIS — K220 Achalasia of cardia: Secondary | ICD-10-CM

## 2018-07-20 NOTE — Progress Notes (Signed)
HPI :  78 y/o male here for a follow up for achalasia. He previously was referred to me about a year ago for dysphagia. He had a severe GEJ stenosis associated with a large distal esophageal diverticulum. Dilations performed to 74mm, then 14.17mm, and then 43mm did not provide much benefit. Barium swallow was concerning for possible achalasia. Type II achalasia was noted on manometry in April 2019. I referred him to Dr. Neysa Bonito for evaluation for heller myotomy. He referred the patient to cardiology for pre-operative evaluation and the patient was noted to have severe aortic stenosis. He ultimately had a workup for this by cardiology which led to TAVR being done by Dr. Burt Knack. He had a good result with follow up Echo normally functioning bioprosthetic aortic prothesis. This is the first time he has followed up for his achalasia since his surgical visit. He is present with his daughter and wife today.   He reports ongoing dysphagia his meals. He is limited to a soft diet, he will not try to eat any meat due to severe symptoms. He has been having some ongoing reflux and heartburn taking omeprazole once a day, as well as experiences some regurgitation. He sleeps with his head elevated on a few pillows at night. Sometimes he thinks he regurgitates very small specks of blood, although hard to say the contents is. He denies odynophagia. He reported she did not like his interaction with the surgeon and did not feel comfortable proceeding with that surgery and [place]. He is here to discuss other options.  EGD 07/10/17 - 2cm HH, large diverticulum at distal esophagus, associated GEJ stenosis, dilated to 43mm EGD 08/18/17 - 3cm HH, large diverticulum at distal esophagus, GEJ stenosis dilated to 14.59mm Barium swallow 09/03/17 - high grade stricture at GEJ, R epiphrenic diverticulum 5cm in maximal diameter with wide neck, nonspecific dysmotility EGD 10/12/17 - GEJ stricture dilated to 77mm, large diverticulum,  placement of manometry catheter Manometry 10/12/17 - c/w type II achalasia, referred him to surgery      Past Medical History:  Diagnosis Date  . AAA (abdominal aortic aneurysm) ([place])   . Achalasia   . Carotid stenosis, asymptomatic, bilateral    per duplex --- bilateral <50% ICA  . COPD (chronic obstructive pulmonary disease) ([place])    pulmologist-  dr [name]  . GERD (gastroesophageal reflux disease)   . Hyperlipidemia   . Hypertension   . Hypothyroidism   . Lesion of left lung    followed by dr [name] (pulmologist)  post infection scarring  . Mild dementia ([place])   . OSA (obstructive sleep apnea)    has a cpap-  intolerant; due to Claustaphobia (01/25/2018)  . Peripheral vascular disease [facility])    cardiologist-- dr berry-- per last ABI/Aorta/Iliac 06-09-2016  patent bilateral iliac kissing stents, >50% stenosis bilateral iliac artery (R>L) and left external iliac artery, patent IVC  . Prostate cancer [facility])    urologist-  dr [name] oncologist-  dr [name]. Dx'd 2018 s/p XRT  . RLS (restless legs syndrome)   . S/P TAVR (transcatheter aortic valve replacement)    s/p TAVR via transapical approach with an Edwards Sapien 3 THV (size 26 mm, model # 9600TFX, serial # [id number])  . Schatzki's ring   . Severe aortic stenosis      Past Surgical History:  Procedure Laterality Date  . ABDOMINAL AORTOGRAM N/A 01/25/2018   Procedure: ABDOMINAL AORTOGRAM;  Surgeon: [name], MD;  Location: [place] [facility];  Service: Cardiovascular;  Laterality: N/A;  . ANAL FISTULECTOMY  2010  . CARDIAC CATHETERIZATION Bilateral 01/25/2018  . CARDIOVASCULAR STRESS TEST  04-23-2016   dr berry   normal nuclear study w/ no ischemia/  normal LV function and wall function, nuclear stress ef 57%  . CATARACT EXTRACTION W/ INTRAOCULAR LENS  IMPLANT, BILATERAL Bilateral   . COLONOSCOPY    . ESOPHAGEAL MANOMETRY N/A 10/12/2017   Procedure: ESOPHAGEAL MANOMETRY (EM);  Surgeon: [name], MD;   [facility];  Service: Gastroenterology;  Laterality: N/A;  . ESOPHAGOGASTRODUODENOSCOPY N/A 10/12/2017   Procedure: ESOPHAGOGASTRODUODENOSCOPY (EGD);  Surgeon: [name], MD;  Location: [facility];  Service: Gastroenterology;  Laterality: N/A;  . ESOPHAGOGASTRODUODENOSCOPY (EGD) WITH ESOPHAGEAL DILATION  last one 11-09-2015  dr Yolanda Bonine   dilation schatzki's ring  . GOLD SEED IMPLANT N/A 04/29/2017   Procedure: GOLD SEED IMPLANT;  Surgeon: [name], MD;  Location[facility];  Service: Urology;  Laterality: N/A;  . HERNIA REPAIR    . INGUINAL HERNIA REPAIR Right 02/23/2014   Procedure: HERNIA REPAIR INGUINAL ADULT;  Surgeon: Joyice Faster. Cornett, MD;  Location: [place];  Service: General;  Laterality: Right;  . INSERTION OF MESH Right 02/23/2014   Procedure: INSERTION OF MESH;  Surgeon: Joyice Faster. Cornett, MD;  Location: [place];  Service: General;  Laterality: Right;  . PERIPHERAL VASCULAR CATHETERIZATION N/A 05/29/2016   Procedure: Lower Extremity Angiography;  Surgeon: [name], MD;  Location: [place] [facility];  Service: Cardiovascular;  Laterality: N/A; Diamondback orbital rotational atherectomy, PTA and stenting bilateral common iliac arteries   . PILONIDAL CYST / SINUS EXCISION  2000  . PROSTATE BIOPSY  09-22-2016  dr [name] office  . RIGHT/LEFT HEART CATH AND CORONARY ANGIOGRAPHY N/A 01/25/2018   Procedure: RIGHT/LEFT HEART CATH AND CORONARY ANGIOGRAPHY;  Surgeon: [name], MD;  Location: [place] [facility];  Service: Cardiovascular;  Laterality: N/A;  . SAVORY DILATION N/A 10/12/2017   Procedure: SAVORY DILATION;  Surgeon: [name], MD;  [facility];  Service: Gastroenterology;  Laterality: N/A;  . SPACE OAR INSTILLATION N/A 04/29/2017   Procedure: SPACE OAR INSTILLATION;  Surgeon: [name], MD;  Location: [facility];  Service: Urology;  Laterality:  N/A;  . TRANSCATHETER AORTIC VALVE REPLACEMENT, TRANSAPICAL Left 03/16/2018   Procedure: TRANSCATHETER AORTIC VALVE REPLACEMENT, TRANSAPICAL;  Surgeon: Sherren Mocha, MD;  Location: [place];  Service: Open Heart Surgery;  Laterality: Left;  . TRANSTHORACIC ECHOCARDIOGRAM  05-05-2016   dr berry   ef 67-61%,  grade 1diastolic dysfunction/  moderate AV stenosis with mod. AR (right coronary and noncoronary cusp mobility seversly restricted);  valve area 1.25cm^2, mean gradient 27 mmHg, peak gradient 6mmHg/  mild MR  . UPPER GASTROINTESTINAL ENDOSCOPY     Family History  Problem Relation Age of Onset  . Heart attack Mother   . Stroke Father   . Asthma Father   . Cancer Neg Hx   . Colon cancer Neg Hx   . Stomach cancer Neg Hx   . Esophageal cancer Neg Hx   . Rectal cancer Neg Hx   . Pancreatic cancer Neg Hx   . Prostate cancer Neg Hx    Social History   Tobacco Use  . Smoking status: Former Smoker    Packs/day: 1.00    Years: 50.00    Pack years: 50.00    Types: Cigarettes    Last attempt to quit: 07/05/2005    Years  since quitting: 13.0  . Smokeless tobacco: Never Used  Substance Use Topics  . Alcohol use: No  . Drug use: Never   Current Outpatient Medications  Medication Sig Dispense Refill  . albuterol (PROVENTIL HFA;VENTOLIN HFA) 108 (90 Base) MCG/ACT inhaler Inhale 1 puff into the lungs every 6 (six) hours as needed for wheezing or shortness of breath.    . alfuzosin (UROXATRAL) 10 MG 24 hr tablet Take 10 mg by mouth daily. After lunch    . apixaban (ELIQUIS) 2.5 MG TABS tablet Take 1 tablet (2.5 mg total) by mouth 2 (two) times daily. 60 tablet 6  . aspirin EC 81 MG tablet Take 81 mg by mouth daily.    . Cholecalciferol (VITAMIN D-3) 5000 units TABS Take 5,000 Units by mouth daily at 3 pm.    . feeding supplement, ENSURE ENLIVE, (ENSURE ENLIVE) LIQD Take 237 mLs by mouth 3 (three) times daily between meals.    . fluticasone (FLONASE) 50 MCG/ACT nasal spray Place 2 sprays into  both nostrils daily as needed for allergies or rhinitis.     [name] levothyroxine (SYNTHROID, LEVOTHROID) 50 MCG tablet Take 50 mcg by mouth daily before breakfast.     No current facility-administered medications for this visit.    Allergies  Allergen Reactions  . Penicillins Anaphylaxis, Itching, Swelling and Other (See Comments)  . Sulfa Antibiotics Anaphylaxis and Swelling  . Versed [Midazolam] Other (See Comments)    Confusion, delerium     Review of Systems: All systems reviewed and negative except where noted in HPI.    Lab Results  Component Value Date   WBC 5.3 03/21/2018   HGB 10.3 (L) 03/21/2018   HCT 31.4 (L) 03/21/2018   MCV 96.9 03/21/2018   PLT 104 (L) 03/21/2018    Lab Results  Component Value Date   CREATININE 0.71 03/21/2018   BUN 12 03/21/2018   NA 140 03/21/2018   K 3.7 03/21/2018   CL 103 03/21/2018   CO2 26 03/21/2018    Lab Results  Component Value Date   ALT 12 03/11/2018   AST 17 03/11/2018   ALKPHOS 63 03/11/2018   BILITOT 1.1 03/11/2018     Physical Exam: BP 112/60   Pulse 70   Ht 5\' 5"  (1.651 m)   Wt 134 lb (60.8 kg)   BMI 22.30 kg/m  Constitutional: Pleasant, male in no acute distress. HEENT: Normocephalic and atraumatic. Conjunctivae are normal. No scleral icterus. Neck supple.  Cardiovascular: Normal rate, regular rhythm.  Pulmonary/chest: Effort normal and breath sounds normal. No wheezing, rales or rhonchi. Abdominal: Soft, nondistended, nontender. There are no masses palpable. No hepatomegaly. Extremities: no edema Lymphadenopathy: No cervical adenopathy noted. Neurological: Alert and oriented to person place and time. Skin: Skin is warm and dry. No rashes noted. Psychiatric: Normal mood and affect. Behavior is normal.   ASSESSMENT AND PLAN: 78 year old male here for reassessment of the following issues:  Achalasia type II / GERD - ongoing dysphagia to which balloon dilation up to 18 mm did not provide benefit. This in  conjunction with large esophageal diverticulum led to workup for achalasia, manometry confirmed the diagnosis in April. He has seen surgery here in [place] and is not comfortable proceeding with that intervention here after discussion of the option. He has undergone significant cardiac workup in recent months is now recovered from that and doing pretty well, although is on Eliquis. I discussed options with him to include surgery, POEM, bag dilation, and Botox injections. POEM is  typically treatment of choice for type III, although can be used for type I and II. I'm going to discuss his case with my colleague Dr. Silverio Decamp who performs bag dilation to discuss his candidacy for this, versus referral to [facility] for consideration of POEM. He wishes to treat this noninvasively and avoid a surgery if possible which is reasonable. I will get back to them in the upcoming days. In the interim he will increase omeprazole to twice daily dosing and sleep with HOB elevated to minimize risk for aspiration.   Irondale Cellar, MD [facility] Gastroenterology

## 2018-07-26 ENCOUNTER — Telehealth: Payer: Self-pay | Admitting: Gastroenterology

## 2018-07-26 NOTE — Telephone Encounter (Signed)
Spoke with [name].  Patient will be seen here on 08/05/2018 at 11:00 am per dr Silverio Decamp.

## 2018-07-26 NOTE — Telephone Encounter (Signed)
Spoke with the patient. He asks that I call the daughter [name] [phone]

## 2018-07-26 NOTE — Telephone Encounter (Signed)
I spoke with Dr. Silverio Decamp about this case, regarding dilation versus POEM. May be best for her to see him in clinic to discuss these options further.  Beth can you let this patient know, and that Dr. Silverio Decamp agreed to see him in clinic to discuss dilation with her versus referral elsewhere for POEM evaluation. Can you help schedule him in her clinic? Thanks

## 2018-07-26 NOTE — Telephone Encounter (Signed)
Spoke with [name]. Consulting with Dr Silverio Decamp on scheduling.

## 2018-08-01 ENCOUNTER — Other Ambulatory Visit: Payer: Self-pay | Admitting: Physician Assistant

## 2018-08-05 ENCOUNTER — Telehealth: Payer: Self-pay | Admitting: *Deleted

## 2018-08-05 ENCOUNTER — Encounter: Payer: Self-pay | Admitting: Gastroenterology

## 2018-08-05 ENCOUNTER — Ambulatory Visit (INDEPENDENT_AMBULATORY_CARE_PROVIDER_SITE_OTHER): Payer: Medicare Other | Admitting: Gastroenterology

## 2018-08-05 VITALS — BP 120/58 | HR 66 | Ht 65.0 in | Wt 136.0 lb

## 2018-08-05 DIAGNOSIS — K222 Esophageal obstruction: Secondary | ICD-10-CM | POA: Diagnosis not present

## 2018-08-05 DIAGNOSIS — E43 Unspecified severe protein-calorie malnutrition: Secondary | ICD-10-CM

## 2018-08-05 DIAGNOSIS — R1310 Dysphagia, unspecified: Secondary | ICD-10-CM

## 2018-08-05 DIAGNOSIS — K220 Achalasia of cardia: Secondary | ICD-10-CM

## 2018-08-05 DIAGNOSIS — R1319 Other dysphagia: Secondary | ICD-10-CM

## 2018-08-05 DIAGNOSIS — R634 Abnormal weight loss: Secondary | ICD-10-CM

## 2018-08-05 DIAGNOSIS — Q396 Congenital diverticulum of esophagus: Secondary | ICD-10-CM | POA: Diagnosis not present

## 2018-08-05 NOTE — Progress Notes (Signed)
Robert Schaefer    [id number]    09/04/1940  Primary Care Physician:McKeown, Gwyndolyn Saxon, MD  Referring Physician: [name], MD [address] [place], [place] [address]  Chief complaint: Dysphagia  HPI: 78 year old male with severe dysphagia associated with significant weight loss to discuss further management of EGJ outflow obstruction possibly achalasia.  He is accompanied by his wife and daughter. Patient had evidence of severe GE junction stenosis associated with large distal esophageal diverticulum, underwent esophageal dilation X2 maximum 18 mm balloon.  Barium swallow showed findings concerning for achalasia.  Esophageal manometry with findings suggestive of type II achalasia. Other medical history includes severe aortic stenosis status post TAVR.  He is currently able to eat only pured food.  Patient says swallowing improved after last dilation in April 2019 but lasted for only couple months or so.  He was able to eat even steak during that time.  He has regurgitation with liquids.  Denies any aspiration or trouble breathing or cough.  EGD 07/10/17 - 2cm HH, large diverticulum at distal esophagus, associated GEJ stenosis, dilated to 42mm EGD 08/18/17 - 3cm HH, large diverticulum at distal esophagus, GEJ stenosis dilated to 14.23mm Barium swallow 09/03/17 - high grade stricture at GEJ, R epiphrenic diverticulum 5cm in maximal diameter with wide neck, nonspecific dysmotility EGD 10/12/17 - GEJ stricture dilated to 5mm, large diverticulum, placement of manometry catheter Manometry 10/12/17 - c/w type II achalasia, referred him to surgery Outpatient Encounter Medications as of 08/05/2018  Medication Sig  . albuterol (PROVENTIL HFA;VENTOLIN HFA) 108 (90 Base) MCG/ACT inhaler Inhale 1 puff into the lungs every 6 (six) hours as needed for wheezing or shortness of breath.  . alfuzosin (UROXATRAL) 10 MG 24 hr tablet Take 10 mg by mouth daily. After lunch  .  apixaban (ELIQUIS) 2.5 MG TABS tablet Take 1 tablet (2.5 mg total) by mouth 2 (two) times daily.  [name] aspirin EC 81 MG tablet Take 81 mg by mouth daily.  . Cholecalciferol (VITAMIN D-3) 5000 units TABS Take 5,000 Units by mouth daily at 3 pm.  . feeding supplement, ENSURE ENLIVE, (ENSURE ENLIVE) LIQD Take 237 mLs by mouth 3 (three) times daily between meals.  . fluticasone (FLONASE) 50 MCG/ACT nasal spray Place 2 sprays into both nostrils daily as needed for allergies or rhinitis.   [name] levothyroxine (SYNTHROID, LEVOTHROID) 50 MCG tablet Take 50 mcg by mouth daily before breakfast.  . omeprazole (PRILOSEC) 40 MG capsule Take 1 capsule (40 mg total) by mouth 2 (two) times daily. TAKE 1 CAPSULE BY MOUTH TWICE A DAY   No facility-administered encounter medications on file as of 08/05/2018.     Allergies as of 08/05/2018 - Review Complete 08/05/2018  Allergen Reaction Noted  . Penicillins Anaphylaxis, Itching, Swelling, and Other (See Comments) 07/04/2013  . Sulfa antibiotics Anaphylaxis and Swelling 07/04/2013  . Versed [midazolam] Other (See Comments) 05/31/2016    Past Medical History:  Diagnosis Date  . AAA (abdominal aortic aneurysm) ([place])   . Achalasia   . Carotid stenosis, asymptomatic, bilateral    per duplex --- bilateral <50% ICA  . COPD (chronic obstructive pulmonary disease) ([place])    pulmologist-  dr [name]  . GERD (gastroesophageal reflux disease)   . Hyperlipidemia   . Hypertension   . Hypothyroidism   . Lesion of left lung    followed by dr [name] (pulmologist)  post infection scarring  . Mild dementia ([place])   . OSA (  obstructive sleep apnea)    has a cpap-  intolerant; due to Claustaphobia (01/25/2018)  . Peripheral vascular disease [facility])    cardiologist-- dr berry-- per last ABI/Aorta/Iliac 06-09-2016  patent bilateral iliac kissing stents, >50% stenosis bilateral iliac artery (R>L) and left external iliac artery, patent IVC  . Prostate cancer [facility])    urologist-  dr [name]  oncologist-  dr [name]. Dx'd 2018 s/p XRT  . RLS (restless legs syndrome)   . S/P TAVR (transcatheter aortic valve replacement)    s/p TAVR via transapical approach with an Edwards Sapien 3 THV (size 26 mm, model # 9600TFX, serial # [id number])  . Schatzki's ring   . Severe aortic stenosis     Past Surgical History:  Procedure Laterality Date  . ABDOMINAL AORTOGRAM N/A 01/25/2018   Procedure: ABDOMINAL AORTOGRAM;  Surgeon: [name], MD;  Location: [place] [facility];  Service: Cardiovascular;  Laterality: N/A;  . ANAL FISTULECTOMY  2010  . CARDIAC CATHETERIZATION Bilateral 01/25/2018  . CARDIOVASCULAR STRESS TEST  04-23-2016   dr berry   normal nuclear study w/ no ischemia/  normal LV function and wall function, nuclear stress ef 57%  . CATARACT EXTRACTION W/ INTRAOCULAR LENS  IMPLANT, BILATERAL Bilateral   . COLONOSCOPY    . ESOPHAGEAL MANOMETRY N/A 10/12/2017   Procedure: ESOPHAGEAL MANOMETRY (EM);  Surgeon: [name], MD;  [facility];  Service: Gastroenterology;  Laterality: N/A;  . ESOPHAGOGASTRODUODENOSCOPY N/A 10/12/2017   Procedure: ESOPHAGOGASTRODUODENOSCOPY (EGD);  Surgeon: [name], MD;  [facility];  Service: Gastroenterology;  Laterality: N/A;  . ESOPHAGOGASTRODUODENOSCOPY (EGD) WITH ESOPHAGEAL DILATION  last one 11-09-2015  dr Yolanda Bonine   dilation schatzki's ring  . GOLD SEED IMPLANT N/A 04/29/2017   Procedure: GOLD SEED IMPLANT;  Surgeon: [name], MD;  Location: [facility];  Service: Urology;  Laterality: N/A;  . HERNIA REPAIR    . INGUINAL HERNIA REPAIR Right 02/23/2014   Procedure: HERNIA REPAIR INGUINAL ADULT;  Surgeon: Joyice Faster. Cornett, MD;  Location: [place];  Service: General;  Laterality: Right;  . INSERTION OF MESH Right 02/23/2014   Procedure: INSERTION OF MESH;  Surgeon: Joyice Faster. Cornett, MD;  Location: [place];  Service: General;  Laterality: Right;  .  PERIPHERAL VASCULAR CATHETERIZATION N/A 05/29/2016   Procedure: Lower Extremity Angiography;  Surgeon: [name], MD;  Location: [place] [facility];  Service: Cardiovascular;  Laterality: N/A; Diamondback orbital rotational atherectomy, PTA and stenting bilateral common iliac arteries   . PILONIDAL CYST / SINUS EXCISION  2000  . PROSTATE BIOPSY  09-22-2016  dr [name] office  . RIGHT/LEFT HEART CATH AND CORONARY ANGIOGRAPHY N/A 01/25/2018   Procedure: RIGHT/LEFT HEART CATH AND CORONARY ANGIOGRAPHY;  Surgeon: [name], MD;  Location: [place] [facility];  Service: Cardiovascular;  Laterality: N/A;  . SAVORY DILATION N/A 10/12/2017   Procedure: SAVORY DILATION;  Surgeon: [name], MD;  [facility];  Service: Gastroenterology;  Laterality: N/A;  . SPACE OAR INSTILLATION N/A 04/29/2017   Procedure: SPACE OAR INSTILLATION;  Surgeon: [name], MD;  [facility];  Service: Urology;  Laterality: N/A;  . TRANSCATHETER AORTIC VALVE REPLACEMENT, TRANSAPICAL Left 03/16/2018   Procedure: TRANSCATHETER AORTIC VALVE REPLACEMENT, TRANSAPICAL;  Surgeon: Sherren Mocha, MD;  Location: [place];  Service: Open Heart Surgery;  Laterality: Left;  . TRANSTHORACIC ECHOCARDIOGRAM  05-05-2016   dr berry   ef 85-88%,  grade 1diastolic dysfunction/  moderate AV stenosis with mod. AR (right coronary and noncoronary cusp mobility seversly restricted);  valve area 1.25cm^2, mean gradient 27 mmHg, peak gradient 37mmHg/  mild MR  . UPPER GASTROINTESTINAL ENDOSCOPY      Family History  Problem Relation Age of Onset  . Heart attack Mother   . Stroke Father   . Asthma Father   . Cancer Neg Hx   . Colon cancer Neg Hx   . Stomach cancer Neg Hx   . Esophageal cancer Neg Hx   . Rectal cancer Neg Hx   . Pancreatic cancer Neg Hx   . Prostate cancer Neg Hx     Social History   Socioeconomic History  . Marital status: Married    Spouse name: Not on file  .  Number of children: Not on file  . Years of education: Not on file  . Highest education level: Not on file  Occupational History  . Not on file  Social Needs  . Financial resource strain: Not on file  . Food insecurity:    Worry: Not on file    Inability: Not on file  . Transportation needs:    Medical: Not on file    Non-medical: Not on file  Tobacco Use  . Smoking status: Former Smoker    Packs/day: 1.00    Years: 50.00    Pack years: 50.00    Types: Cigarettes    Last attempt to quit: 07/05/2005    Years since quitting: 13.0  . Smokeless tobacco: Never Used  Substance and Sexual Activity  . Alcohol use: No  . Drug use: Never  . Sexual activity: Not Currently  Lifestyle  . Physical activity:    Days per week: Not on file    Minutes per session: Not on file  . Stress: Not on file  Relationships  . Social connections:    Talks on phone: Not on file    Gets together: Not on file    Attends religious service: Not on file    Active member of club or organization: Not on file    Attends meetings of clubs or organizations: Not on file    Relationship status: Not on file  . Intimate partner violence:    Fear of current or ex partner: Not on file    Emotionally abused: Not on file    Physically abused: Not on file    Forced sexual activity: Not on file  Other Topics Concern  . Not on file  Social History Narrative  . Not on file      Review of systems: Review of Systems  Constitutional: Negative for fever and chills.  Positive for weight loss HENT: Negative.   Eyes: Negative for blurred vision.  Respiratory: Negative for cough, shortness of breath and wheezing.   Cardiovascular: Negative for chest pain and palpitations.  Gastrointestinal: as per HPI Genitourinary: Negative for dysuria, urgency, frequency and hematuria.  Musculoskeletal: Negative for myalgias, back pain and joint pain.  Skin: Negative for itching and rash.  Neurological: Negative for dizziness,  tremors, focal weakness, seizures and loss of consciousness.  Endo/Heme/Allergies: Negative for seasonal allergies.  Psychiatric/Behavioral: Negative for depression, suicidal ideas and hallucinations.  All other systems reviewed and are negative.   Physical Exam: Vitals:   08/05/18 1057  BP: (!) 120/58  Pulse: 66   Body mass index is 22.63 kg/m. Gen:      No acute distress HEENT:  EOMI, sclera anicteric Neck:     No  masses; no thyromegaly Lungs:    Clear to auscultation bilaterally; normal respiratory effort CV:         Regular rate and rhythm; no murmurs Abd:      + bowel sounds; soft, non-tender; no palpable masses, no distension Ext:    No edema; adequate peripheral perfusion Skin:      Warm and dry; no rash Neuro: alert and oriented x 3 Psych: normal mood and affect  Data Reviewed:  Reviewed labs, radiology imaging, old records and pertinent past GI work up   Assessment and Plan/Recommendations:  78 year old male with history of severe dysphagia predominantly solids and regurgitation with liquids High-grade EG junction stenosis with large epiphrenic diverticula. Manometry and barium swallow findings suggestive of possible achalasia He is on Eliquis for anticoagulation Discussed different options available to relieve the EG J outflow obstruction.  Patient does not want to go out of town to [facility] for POEM or [facility]. He is willing to proceed with pneumatic balloon dilation. Discussed in detail the possible benefits and risks involved with pneumatic balloon dilation including risk for bleeding, esophageal tear and perforation  Given he had significant improvement in the past with TTS balloon dilation maximum size 18 mm, will first attempt to dilate with TTS balloon to 20 mm to see if he has any significant improvement of symptoms.  If not then will proceed to pneumatic balloon dilation in 4 weeks.  Continue high-calorie high-protein diet, soft pured small frequent  meals Discussed antireflux measures in detail, avoiding meals 3 to 4 hours before bedtime and sleeping with head and elevation to prevent aspiration  Will discuss with cardiology if okay to hold Eliquis for 2 days prior to the dilation.  40 minutes was spent face-to-face with the patient. Greater than 50% of the time used for counseling as well as treatment plan and follow-up.  He and family had multiple questions which were answered to their satisfaction  K. Denzil Magnuson , MD [phone]    CC: [name], MD

## 2018-08-05 NOTE — Patient Instructions (Signed)
You have been scheduled for an endoscopy. Please follow written instructions given to you at your visit today. If you use inhalers (even only as needed), please bring them with you on the day of your procedure.   We will contact you with your Endoscopy instructions for [facility] it will be scheduled on 09/21/2018  You will be contacted by our office prior to your procedure for directions on holding your Eliquis.  If you do not hear from our office 1 week prior to your scheduled procedure, please call [phone] to discuss.   I appreciate the  opportunity to care for you  Thank You   Harl Bowie , MD

## 2018-08-05 NOTE — Telephone Encounter (Signed)
[name] Pre-operative Risk Assessment     Request for surgical clearance:     Endoscopy Procedure  What type of surgery is being performed?     EGD  When is this surgery scheduled?     08/19/2018  And 09/21/2018  What type of clearance is required ?   Pharmacy  Are there any medications that need to be held prior to surgery and how long? Eliquis   Practice name and name of physician performing surgery?      [facility]  What is your office phone and fax number?      Phone- [phone]  [phone]  Anesthesia type (None, local, MAC, general) ?       MAC

## 2018-08-06 ENCOUNTER — Encounter: Payer: Self-pay | Admitting: Gastroenterology

## 2018-08-09 ENCOUNTER — Other Ambulatory Visit: Payer: Self-pay | Admitting: *Deleted

## 2018-08-09 ENCOUNTER — Telehealth: Payer: Self-pay | Admitting: *Deleted

## 2018-08-09 DIAGNOSIS — K220 Achalasia of cardia: Secondary | ICD-10-CM

## 2018-08-09 NOTE — Telephone Encounter (Signed)
Need to inform patient of date and time of his EGD with Pneumatics at [facility] and to plan on being there at least 5 hours

## 2018-08-09 NOTE — Telephone Encounter (Signed)
   Primary Cardiologist: Quay Burow, MD  Chart reviewed as part of pre-operative protocol coverage. Patient was contacted 08/09/2018 in reference to pre-operative risk assessment for pending surgery as outlined below.  Robert Schaefer was last seen on 06/15/2018 by Dr. [name].  Since that day, Robert Schaefer has done well without any chest pain or shortness of breath. His functional ability is quite limited, but echo in Oct 2019 and cardiac catheterization in July 2019 were reassuring.  Therefore, based on ACC/AHA guidelines, the patient would be at acceptable risk for the planned procedure without further cardiovascular testing.   I will route this recommendation to the requesting party via Epic fax function and remove from pre-op pool.  Please call with questions. Per recommendation by our clinical pharmacist: "we recommend he hold eliquis for 1-2 days prior to the procedure and restart after the procedure as soon as possible"  Almyra Deforest, [place] 08/09/2018, 2:54 PM

## 2018-08-09 NOTE — Telephone Encounter (Signed)
Patient takes Eliquis for afib with CHADS2VASc score of 4 (age x2, HTN, PVD). Renal function is normal. Ok to hold Eliquis for 1-2 days prior to each procedure date listed below.

## 2018-08-11 NOTE — Telephone Encounter (Signed)
Called patient about holding Eliquis and he is aware to hold two days prior to his proceures  He wants me to contact his daughter [name] with the date and times of the EGD with Pneumatics at [facility]  Which is scheduled on 09/21/2018 at 8:30am  Pt to arrive at 7am Will contact daughter after 2:30pm today per fathers request as the daughter works till 2:30pm

## 2018-08-12 NOTE — Telephone Encounter (Signed)
Spoke with patients daughter [name] about holding eliquis before each procedure and date and times of [facility] procedure on 09/21/2018 and to be NPO 12 hours prior to procedure

## 2018-08-19 ENCOUNTER — Ambulatory Visit (AMBULATORY_SURGERY_CENTER): Payer: Medicare Other | Admitting: Gastroenterology

## 2018-08-19 ENCOUNTER — Encounter: Payer: Self-pay | Admitting: Gastroenterology

## 2018-08-19 VITALS — BP 124/61 | HR 61 | Temp 98.0°F | Resp 13 | Ht 65.0 in | Wt 136.0 lb

## 2018-08-19 DIAGNOSIS — K449 Diaphragmatic hernia without obstruction or gangrene: Secondary | ICD-10-CM

## 2018-08-19 DIAGNOSIS — R1310 Dysphagia, unspecified: Secondary | ICD-10-CM | POA: Diagnosis not present

## 2018-08-19 DIAGNOSIS — R1319 Other dysphagia: Secondary | ICD-10-CM

## 2018-08-19 DIAGNOSIS — K222 Esophageal obstruction: Secondary | ICD-10-CM | POA: Diagnosis not present

## 2018-08-19 MED ORDER — SODIUM CHLORIDE 0.9 % IV SOLN: 500.0000 mL | Freq: Once | INTRAVENOUS | Status: DC

## 2018-08-19 NOTE — Op Note (Addendum)
[place] Patient Name: Robert Schaefer Procedure Date: 08/19/2018 10:10 AM MRN: [id number] Endoscopist: [name] , MD Age: 78 Referring MD:  Date of Birth: 03-04-1941 Gender: Male Account #: [id number] Procedure:                Upper GI endoscopy Indications:              Dysphagia Medicines:                Monitored Anesthesia Care Procedure:                Pre-Anesthesia Assessment:                           - Prior to the procedure, a History and Physical                            was performed, and patient medications and                            allergies were reviewed. The patient's tolerance of                            previous anesthesia was also reviewed. The risks                            and benefits of the procedure and the sedation                            options and risks were discussed with the patient.                            All questions were answered, and informed consent                            was obtained. Prior Anticoagulants: The patient                            last took Eliquis (apixaban) 2 days prior to the                            procedure. ASA Grade Assessment: III - A patient                            with severe systemic disease. After reviewing the                            risks and benefits, the patient was deemed in                            satisfactory condition to undergo the procedure.                           After obtaining informed consent, the endoscope was  passed under direct vision. Throughout the                            procedure, the patient's blood pressure, pulse, and                            oxygen saturations were monitored continuously. The                            Endoscope was introduced through the mouth, with                            the intention of advancing to the stomach. The                            scope was advanced to the gastric cardia  before the                            procedure was aborted. Medications were given. The                            upper GI endoscopy was technically difficult and                            complex due to presence of food. The patient                            tolerated the procedure well. Scope In: Scope Out: Findings:                 Food was found in the entire esophagus. Suctioned                            liquid but unable to suction the food bezoar.                           The lumen of the lower third of the esophagus was                            moderately dilated.                           A diverticulum with a large opening was found in                            the lower third of the esophagus.                           One benign-appearing, intrinsic moderate                            (circumferential scarring or stenosis; an endoscope  may pass) stenosis was found 36 to 37 cm from the                            incisors. This stenosis measured 1 cm (inner                            diameter) x less than one cm (in length). The                            stenosis was traversed.                           A small hiatal hernia was present. Complications:            No immediate complications. Estimated Blood Loss:     Estimated blood loss was minimal. Impression:               - Food in the esophagus.                           - Dilation in the lower third of the esophagus.                           - Diverticulum in the lower third of the esophagus.                           - Benign-appearing esophageal stenosis. (?Distal                            esophageal stricture is likely etiology of EGJ                            outflow obstruction than achalasia) He has                            aperistalsis on esophageal manometry                           - Small hiatal hernia.                           - No specimens  collected. Recommendation:           - Patient has a contact number available for                            emergencies. The signs and symptoms of potential                            delayed complications were discussed with the                            patient. Return to normal activities tomorrow.                            Written discharge instructions were  provided to the                            patient.                           - Full liquid/pureed diet                           - Continue present medications.                           - Repeat upper endoscopy with esophageal dilation                            with TTS balloon and +/- pneumatic balloon at                            appointment to be scheduled for retreatment at [facility]                            endoscopy with general anaesthesia                           - Follow an antireflux regimen indefinitely.                           - Resume Eliquis (apixaban) at prior dose today. [name], MD 08/19/2018 10:52:02 AM This report has been signed electronically.

## 2018-08-19 NOTE — Patient Instructions (Signed)
YOU HAD AN ENDOSCOPIC PROCEDURE TODAY AT [facility]:   Refer to the procedure report that was given to you for any specific questions about what was found during the examination.  If the procedure report does not answer your questions, please call your gastroenterologist to clarify.  If you requested that your care partner not be given the details of your procedure findings, then the procedure report has been included in a sealed envelope for you to review at your convenience later.  YOU SHOULD EXPECT: Some feelings of bloating in the abdomen. Passage of more gas than usual.  Walking can help get rid of the air that was put into your GI tract during the procedure and reduce the bloating. If you had a lower endoscopy (such as a colonoscopy or flexible sigmoidoscopy) you may notice spotting of blood in your stool or on the toilet paper. If you underwent a bowel prep for your procedure, you may not have a normal bowel movement for a few days.  Please Note:  You might notice some irritation and congestion in your nose or some drainage.  This is from the oxygen used during your procedure.  There is no need for concern and it should clear up in a day or so.  SYMPTOMS TO REPORT IMMEDIATELY:    Following upper endoscopy (EGD)  Vomiting of blood or coffee ground material  New chest pain or pain under the shoulder blades  Painful or persistently difficult swallowing  New shortness of breath  Fever of 100F or higher  Black, tarry-looking stools  For urgent or emergent issues, a gastroenterologist can be reached at any hour by calling [phone].   DIET:  Full liquid diet. Drink plenty of fluids but you should avoid alcoholic beverages for 24 hours.  Follow an anti-reflux regimen (please see handout given to you by your recovery nurse).  MEDICATIONS: Continue present medications. Resume your Eliquis today at the prior dose.  Repeat Upper Endoscopy at an appointment at [facility] for retreatment with general anesthesia. Dr. Woodward Ku nurse will call you tomorrow to schedule this.  ACTIVITY:  You should plan to take it easy for the rest of today and you should NOT DRIVE or use heavy machinery until tomorrow (because of the sedation medicines used during the test).    FOLLOW UP: Our staff will call the number listed on your records the next business day following your procedure to check on you and address any questions or concerns that you may have regarding the information given to you following your procedure. If we do not reach you, we will leave a message.  However, if you are feeling well and you are not experiencing any problems, there is no need to return our call.  We will assume that you have returned to your regular daily activities without incident.  If any biopsies were taken you will be contacted by phone or by letter within the next 1-3 weeks.  Please call us at [phone] if you have not heard about the biopsies in 3 weeks.   Thank you for allowing [place] to provide for your healthcare needs today.  SIGNATURES/CONFIDENTIALITY: You and/or your care partner have signed paperwork which will be entered into your electronic medical record.  These signatures attest to the fact that that the information above on your After Visit Summary has been reviewed and is understood.  Full responsibility of the confidentiality of this discharge information lies with you and/or your care-partner.

## 2018-08-19 NOTE — Progress Notes (Signed)
PT taken to PACU. Monitors in place. VSS. Report given to RN. 

## 2018-08-19 NOTE — Progress Notes (Signed)
Called to room to assist during endoscopic procedure.  Patient ID and intended procedure confirmed with present staff. Received instructions for my participation in the procedure from the performing physician.  

## 2018-08-19 NOTE — Progress Notes (Signed)
Pt's states no medical or surgical changes since previsit or office visit. 

## 2018-08-20 ENCOUNTER — Telehealth: Payer: Self-pay

## 2018-08-20 ENCOUNTER — Other Ambulatory Visit: Payer: Self-pay

## 2018-08-20 NOTE — Telephone Encounter (Signed)
Left message, will try again a little later this afternoon.

## 2018-08-20 NOTE — Telephone Encounter (Signed)
  Follow up Call-  Call back number 08/19/2018 08/18/2017 07/10/2017  Post procedure Call Back phone  # [phone] hm [phone]  Permission to leave phone message Yes Yes Yes  Some recent data might be hidden     Patient questions:  Do you have a fever, pain , or abdominal swelling? No. Pain Score  0 *  Have you tolerated food without any problems? Yes, discharged home on clear liquids  Have you been able to return to your normal activities? Yes.    Do you have any questions about your discharge instructions: Diet   No. Medications  No. Follow up visit  No.  Do you have questions or concerns about your Care? No.  Actions: * If pain score is 4 or above: No action needed, pain <4.

## 2018-08-23 ENCOUNTER — Telehealth: Payer: Self-pay

## 2018-08-23 NOTE — Telephone Encounter (Signed)
Spoke with the patient. Moved his EGD to 09/07/18 at [facility]. Spoke with the daughter per patient request. New instructions mailed.

## 2018-08-24 ENCOUNTER — Other Ambulatory Visit: Payer: Self-pay | Admitting: Internal Medicine

## 2018-09-02 ENCOUNTER — Encounter: Payer: Self-pay | Admitting: Adult Health

## 2018-09-02 ENCOUNTER — Ambulatory Visit: Payer: Self-pay | Admitting: Adult Health

## 2018-09-02 ENCOUNTER — Ambulatory Visit (INDEPENDENT_AMBULATORY_CARE_PROVIDER_SITE_OTHER): Payer: Medicare Other | Admitting: Adult Health

## 2018-09-02 ENCOUNTER — Ambulatory Visit
Admission: RE | Admit: 2018-09-02 | Discharge: 2018-09-02 | Disposition: A | Discharge status: Home / Self Care | Payer: Medicare Other | Source: Ambulatory Visit | Attending: Adult Health | Admitting: Adult Health

## 2018-09-02 VITALS — BP 112/62 | HR 76 | Temp 98.1°F | Wt 129.0 lb

## 2018-09-02 DIAGNOSIS — R410 Disorientation, unspecified: Secondary | ICD-10-CM | POA: Diagnosis not present

## 2018-09-02 DIAGNOSIS — R05 Cough: Secondary | ICD-10-CM

## 2018-09-02 DIAGNOSIS — R059 Cough, unspecified: Secondary | ICD-10-CM

## 2018-09-02 DIAGNOSIS — R8290 Unspecified abnormal findings in urine: Secondary | ICD-10-CM

## 2018-09-02 DIAGNOSIS — R0989 Other specified symptoms and signs involving the circulatory and respiratory systems: Secondary | ICD-10-CM

## 2018-09-02 DIAGNOSIS — R918 Other nonspecific abnormal finding of lung field: Secondary | ICD-10-CM | POA: Diagnosis not present

## 2018-09-02 MED ORDER — PROMETHAZINE-DM 6.25-15 MG/5ML PO SYRP: 5.0000 mL | ORAL_SOLUTION | Freq: Four times a day (QID) | ORAL | 1 refills | Status: DC | PRN

## 2018-09-02 MED ORDER — LEVOFLOXACIN 750 MG PO TABS: 750.0000 mg | ORAL_TABLET | Freq: Every day | ORAL | 0 refills | Status: AC

## 2018-09-02 NOTE — Progress Notes (Signed)
Assessment and Plan:  Robert Schaefer was seen today for acute visit.  Diagnoses and all orders for this visit:  Intermittent confusion Frail elder with dementia presents for reports of 1 week of intermittent confusion, hallucinations per family Has cough, abnormal lung sounds, strong odor to urine Check labs but will proceed with presumptive treatment to cover UTI and CAP Levaquin selected due to dysphagia and once daily dosing  Emphasized hydration The patient and family (wife, daughter who manages health) was advised to call immediately if he has any concerning symptoms in the interval. They voice understanding of current treatment options and is in agreement with the current care plan.The patient knows to call the clinic with any problems, questions or concerns or go to the ER if any further progression of symptoms.  Follow up if etiology remains clear with workup today and can check further labs -     DG Chest 2 View; Future -     CBC with Differential/Platelet -     COMPLETE METABOLIC PANEL WITH GFR -     Urinalysis w microscopic + reflex cultur -     levofloxacin (LEVAQUIN) 750 MG tablet; Take 1 tablet (750 mg total) by mouth daily for 5 days. -     promethazine-dextromethorphan (PROMETHAZINE-DM) 6.25-15 MG/5ML syrup; Take 5 mLs by mouth 4 (four) times daily as needed for cough.  Further disposition pending results of labs. Discussed med's effects and SE's.   Over 30 minutes of exam, counseling, chart review, and critical decision making was performed.   Future Appointments  Date Time Provider [place]  09/21/2018 10:30 AM WL-DG R/F 1 WL-DG [place]  03/16/2019  9:00 AM Vicie Mutters, PA-C [facility] None  06/16/2019 10:30 AM [facility]-CV CH ECHO 1 [facility]CHO [facility]  06/20/2019 10:00 AM MC-CV NL VASC 4 [facility]-SECVI [facility]  06/20/2019 11:00 AM MC-CV NL VASC 4 MC-SECVI [facility]     ------------------------------------------------------------------------------------------------------------------   HPI BP 112/62   Pulse 76   Temp 98.1 F (36.7 C)   Wt 129 lb (58.5 kg)   SpO2 96%   BMI 21.47 kg/m   78 y.o.male former smoker (50 pack year, quit in 2007) with hx of mild dementia, p. A. Fib, s/p TAVR in 02/2018, hx of COPD presents accompanied by his wife and daughter for valuation of intermittent confusion, hallucinations (seeing people who are not there) for the past week. He is also reporting some L flank pain (denies rash, hx of renal calculi), reporting strong odor to urine and "bad cough" x 1 week, though not significantly productive. He denies CP, dyspnea, wheezing, fatigue, fever/chills, dizziness, headache, dysuria, endorses frequency consistent with baseline (hx of prostate cancer). He denies hematuria, rash.   He is pleasant today, answers questions appropriately, a&ox3, telling jokes  He has been on liquid diet due to scheduled for esophageal stretching next week on 3/10 by Dr. Silverio Decamp.   BMI is Body mass index is 21.47 kg/m. Wt Readings from Last 3 Encounters:  09/02/18 129 lb (58.5 kg)  08/19/18 136 lb (61.7 kg)  08/05/18 136 lb (61.7 kg)   Lab Results  Component Value Date   CREATININE 0.71 03/21/2018     Lab Results  Component Value Date   GFRNONAA >60 03/21/2018       Past Medical History:  Diagnosis Date  . AAA (abdominal aortic aneurysm) ([place])   . Achalasia   . Carotid stenosis, asymptomatic, bilateral    per duplex --- bilateral <50% ICA  . COPD (chronic obstructive pulmonary disease) (  [place])    pulmologist-  dr [name]  . GERD (gastroesophageal reflux disease)   . Hyperlipidemia   . Hypertension   . Hypothyroidism   . Lesion of left lung    followed by dr [name] (pulmologist)  post infection scarring  . Mild dementia ([place])   . OSA (obstructive sleep apnea)    has a cpap-  intolerant; due to Claustaphobia (01/25/2018)  .  Peripheral vascular disease [facility])    cardiologist-- dr berry-- per last ABI/Aorta/Iliac 06-09-2016  patent bilateral iliac kissing stents, >50% stenosis bilateral iliac artery (R>L) and left external iliac artery, patent IVC  . Prostate cancer [facility])    urologist-  dr [name] oncologist-  dr [name]. Dx'd 2018 s/p XRT  . RLS (restless legs syndrome)   . S/P TAVR (transcatheter aortic valve replacement)    s/p TAVR via transapical approach with an Edwards Sapien 3 THV (size 26 mm, model # 9600TFX, serial # [id number])  . Schatzki's ring   . Severe aortic stenosis      Allergies  Allergen Reactions  . Penicillins Anaphylaxis, Itching, Swelling and Other (See Comments)  . Sulfa Antibiotics Anaphylaxis and Swelling  . Versed [Midazolam] Other (See Comments)    Confusion, delerium    Current Outpatient Medications on File Prior to Visit  Medication Sig  . albuterol (PROVENTIL HFA;VENTOLIN HFA) 108 (90 Base) MCG/ACT inhaler Inhale 1 puff into the lungs every 6 (six) hours as needed for wheezing or shortness of breath.  . alfuzosin (UROXATRAL) 10 MG 24 hr tablet Take 10 mg by mouth daily. After lunch  . apixaban (ELIQUIS) 2.5 MG TABS tablet Take 1 tablet (2.5 mg total) by mouth 2 (two) times daily.  [name] aspirin EC 81 MG tablet Take 81 mg by mouth daily.  . Cholecalciferol (VITAMIN D-3) 5000 units TABS Take 5,000 Units by mouth daily at 3 pm.  . feeding supplement, ENSURE ENLIVE, (ENSURE ENLIVE) LIQD Take 237 mLs by mouth 3 (three) times daily between meals.  . fluticasone (FLONASE) 50 MCG/ACT nasal spray Place 2 sprays into both nostrils daily as needed for allergies or rhinitis.   [name] levothyroxine (SYNTHROID, LEVOTHROID) 50 MCG tablet TAKE 1 TABLET BY MOUTH ONCE DAILY  . omeprazole (PRILOSEC) 40 MG capsule Take 1 capsule (40 mg total) by mouth 2 (two) times daily. TAKE 1 CAPSULE BY MOUTH TWICE A DAY   Current Facility-Administered Medications on File Prior to Visit  Medication  . 0.9 %  sodium  chloride infusion    ROS: all negative except above.   Physical Exam:  BP 112/62   Pulse 76   Temp 98.1 F (36.7 C)   Wt 129 lb (58.5 kg)   SpO2 96%   BMI 21.47 kg/m   General Appearance: Well nourished, in no apparent distress. Eyes: PERRLA, EOMs, conjunctiva no swelling or erythema Sinuses: No Frontal/maxillary tenderness ENT/Mouth: Ext aud canals clear, TMs without erythema, bulging. No erythema, swelling, or exudate on post pharynx.  Tonsils not swollen or erythematous. Hearing normal.  Neck: Supple, thyroid normal.  Respiratory: Respiratory effort normal, no retractions or accessory muscle use, BS somewhat diminished through with rhonchi and rales to bilateral bases that does not clear with cough, no wheezing or stridor.  Cardio: RRR with no MRGs. No JVD. Brisk peripheral pulses without edema.  Abdomen: Soft, + BS.  Non tender, no guarding, rebound, palpable hernias, masses. Lymphatics: Non tender without lymphadenopathy.  Musculoskeletal: Symmetrical strength, slow gait with walker.   Skin: Warm, dry without rashes, lesions,  ecchymosis.  Neuro: Cranial nerves intact. Normal muscle tone, Sensation intact.  Psych: Awake and oriented X 3, normal affect, Insight and Judgment fair.     Izora Ribas, NP 3:32 PM [facility]

## 2018-09-02 NOTE — Patient Instructions (Signed)
I am starting an antibiotic that will cover for both pneumonia and for a UTI  Will see what labs and chest xray show  If suddenly getting much worse, please go to the ED  Please call with any new/different symptoms    Levofloxacin tablets What is this medicine? LEVOFLOXACIN (lee voe FLOX a sin) is a quinolone antibiotic. It is used to treat certain kinds of bacterial infections. It will not work for colds, flu, or other viral infections. This medicine may be used for other purposes; ask your health care provider or pharmacist if you have questions. COMMON BRAND NAME(S): Levaquin, Levaquin Leva-Pak What should I tell my health care provider before I take this medicine? They need to know if you have any of these conditions: -bone problems -diabetes -heart disease -high blood pressure -history of irregular heartbeat -history of low levels of potassium in the blood -joint problems -kidney disease -liver disease -mental illness -myasthenia gravis -seizures -tendon problems -tingling of the fingers or toes, or other nerve disorder -an unusual or allergic reaction to levofloxacin, other quinolone antibiotics, foods, dyes, or preservatives -pregnant or trying to get pregnant -breast-feeding How should I use this medicine? Take this medicine by mouth with a full glass of water. Follow the directions on the prescription label. You can take it with or without food. If it upsets your stomach, take it with food. Take your medicine at regular intervals. Do not take your medicine more often than directed. Take all of your medicine as directed even if you think you are better. Do not skip doses or stop your medicine early. Avoid antacids, calcium, iron, and zinc products for 2 hours before and 2 hours after taking a dose of this medicine. A special MedGuide will be given to you by the pharmacist with each prescription and refill. Be sure to read this information carefully each time. Talk to your  pediatrician regarding the use of this medicine in children. While this drug may be prescribed for children as young as 6 months for selected conditions, precautions do apply. Overdosage: If you think you have taken too much of this medicine contact a poison control center or emergency room at once. NOTE: This medicine is only for you. Do not share this medicine with others. What if I miss a dose? If you miss a dose, take it as soon as you remember. If it is almost time for your next dose, take only that dose. Do not take double or extra doses. What may interact with this medicine? Do not take this medicine with any of the following medications: -cisapride -dofetilide -dronedarone -pimozide -thioridazine -ziprasidone This medicine may also interact with the following medications: -antacids -birth control pills -certain medicines for diabetes, like glipizide, glyburide, or insulin -certain medicines that treat or prevent blood clots like warfarin -didanosine buffered tablets or powder -multivitamins -NSAIDS, medicines for pain and inflammation, like ibuprofen or naproxen -other medicines that prolong the QT interval (cause an abnormal heart rhythm) -steroid medicines like prednisone or cortisone -sucralfate -theophylline This list may not describe all possible interactions. Give your health care provider a list of all the medicines, herbs, non-prescription drugs, or dietary supplements you use. Also tell them if you smoke, drink alcohol, or use illegal drugs. Some items may interact with your medicine. What should I watch for while using this medicine? Tell your doctor or healthcare professional if your symptoms do not start to get better or if they get worse. Do not treat diarrhea with over the  counter products. Contact your doctor if you have diarrhea that lasts more than 2 days or if it is severe and watery. Check with your doctor or health care professional if you get an attack of  severe diarrhea, nausea and vomiting, or if you sweat a lot. The loss of too much body fluid can make it dangerous for you to take this medicine. This medicine may affect blood sugar levels. If you have diabetes, check with your doctor or health care professional before you change your diet or the dose of your diabetic medicine. You may get drowsy or dizzy. Do not drive, use machinery, or do anything that needs mental alertness until you know how this medicine affects you. Do not sit or stand up quickly, especially if you are an older patient. This reduces the risk of dizzy or fainting spells. This medicine can make you more sensitive to the sun. Keep out of the sun. If you cannot avoid being in the sun, wear protective clothing and use a sunscreen. Do not use sun lamps or tanning beds/booths. What side effects may I notice from receiving this medicine? Side effects that you should report to your doctor or health care professional as soon as possible: -allergic reactions like skin rash or hives, swelling of the face, lips, or tongue -anxious -bloody or watery diarrhea -breathing problems -confusion -depressed mood -fast, irregular heartbeat -fever -hallucination, loss of contact with reality -joint, muscle, or tendon pain or swelling -loss of memory -muscle weakness -pain, tingling, numbness in the hands or feet -seizures -signs and symptoms of aortic dissection such as sudden chest, stomach, or back pain -signs and symptoms of high blood sugar such as dizziness; dry mouth; dry skin; fruity breath; nausea; stomach pain; increased hunger or thirst; increased urination -signs and symptoms of liver injury like dark yellow or brown urine; general ill feeling or flu-like symptoms; light-colored stools; loss of appetite; nausea; right upper belly pain; unusually weak or tired; yellowing of the eyes or skin -signs and symptoms of low blood sugar such as feeling anxious; confusion; dizziness;  increased hunger; unusually weak or tired; sweating; shakiness; cold; irritable; headache; blurred vision; fast heartbeat; loss of consciousness; pale skin -suicidal thoughts or other mood changes -sunburn -unusually weak or tired Side effects that usually do not require medical attention (report to your doctor or health care professional if they continue or are bothersome): -constipation -dry mouth -headache -nausea, vomiting -trouble sleeping This list may not describe all possible side effects. Call your doctor for medical advice about side effects. You may report side effects to FDA at 1-800-FDA-1088. Where should I keep my medicine? Keep out of the reach of children. Store at room temperature between 15 and 30 degrees C (59 and 86 degrees F). Keep in a tightly closed container. Throw away any unused medicine after the expiration date. NOTE: This sheet is a summary. It may not cover all possible information. If you have questions about this medicine, talk to your doctor, pharmacist, or health care provider.  2019 Elsevier/Gold Standard (2017-12-29 17:41:46)     Confusion Confusion is the inability to think with the usual speed or clarity. People who are confused often describe their thinking as cloudy or unclear. Confusion can also include feeling disoriented. This means you are unaware of where you are or who you are. You may also not know the date or time. When confused, you may have difficulty remembering, paying attention, or making decisions. Some people also act aggressively when they are  confused. In some cases, confusion may come on quickly. In other cases, it may develop slowly over time. How quickly confusion comes on depends on the cause. Confusion may be caused by:  Head injury (concussion).  Seizures.  Stroke.  Fever.  Brain tumor.  Decrease in brain function due to a vascular or neurologic condition (dementia).  Emotions, like rage or terror.  Inability to  know what is real and what is not (hallucinations).  Infections, such as a urinary tract infection (UTI).  Using too much alcohol, drugs, or medicines.  Loss of fluid (dehydration) or an imbalance of salts in the body (electrolytes).  Lack of sleep.  Low blood sugar (diabetes).  Low levels of oxygen. This comes from conditions such as chronic lung disorders.  Side effects of medicines, or taking medicines that affect other medicines (drug interactions).  Lack of certain nutrients, especially niacin, thiamine, vitamin C, or vitamin B.  Sudden drop in body temperature (hypothermia).  Change in routine, such as traveling or being hospitalized. Follow these instructions at home: Pay attention to your symptoms. Tell your health care provider about any changes or if you develop new symptoms. Follow these instructions to control or treat symptoms. Ask a family member or friend for help if needed. Medicines  Take over-the-counter and prescription medicines only as told by your health care provider.  Ask your health care provider about changing or stopping any medicines that may be causing your confusion.  Avoid pain medicines or sleep medicines until you have fully recovered.  Use a pillbox or an alarm to help you take the right medicines at the right time. Lifestyle   Eat a balanced diet that includes fruits and vegetables.  Get enough sleep. For most adults, this is 7-9 hours each night.  Do not drink alcohol.  Do not become isolated. Spend time with other people and make plans for your days.  Do not drive until your health care provider says that it is safe to do so.  Do not use any products that contain nicotine or tobacco, such as cigarettes and e-cigarettes. If you need help quitting, ask your health care provider.  Stop other activities that may increase your chances of getting hurt. These may include some work duties, sports activities, swimming, or bike riding. Ask  your health care provider what activities are safe for you. What caregivers can do  Find out if the person is confused. Ask the person to state his or her name, age, and the date. If the person is unsure or answers incorrectly, he or she may be confused.  Always introduce yourself, no matter how well the person knows you.  Remind the person of his or her location. Do this often.  Place a calendar and clock near the person who is confused.  Talk about current events and plans for the day.  Keep the environment calm, quiet, and peaceful.  Help the person do the things that he or she is unable to do. These include: ? Taking medicines. ? Keeping follow-up visits with his or her health care provider. ? Helping with household duties, including meal preparation. ? Running errands.  Get help if you need it. There are several support groups for caregivers.  If the person you are helping needs more support, consider day care, extended care programs, or a skilled nursing facility. The person's health care provider may be able to help evaluate these options. General instructions  Monitor yourself for any conditions you may have. These  may include: ? Checking your blood glucose levels, if you have diabetes. ? Watching your weight, if you are overweight. ? Monitoring your blood pressure, if you have hypertension. ? Monitoring your body temperature, if you have a fever.  Keep all follow-up visits as told by your health care provider. This is important. Contact a health care provider if:  Your symptoms get worse. Get help right away if you:  Feel that you are not able to care for yourself.  Develop severe headaches, repeated vomiting, seizures, blackouts, or slurred speech.  Have increasing confusion, weakness, numbness, restlessness, or personality changes.  Develop a loss of balance, have marked dizziness, feel uncoordinated, or fall.  Develop severe anxiety, or you have delusions or  hallucinations. These symptoms may represent a serious problem that is an emergency. Do not wait to see if the symptoms will go away. Get medical help right away. Call your local emergency services (911 in the [place] Do not drive yourself to the hospital. Summary  Confusion is the inability to think with the usual speed or clarity. People who are confused often describe their thinking as cloudy or unclear.  Confusion can also include having difficulty remembering, paying attention, or making decisions.  Confusion may come on quickly or develop slowly over time, depending on the cause. There are many different causes of confusion.  Ask for help from family members or friends if you are unable to take care of yourself. This information is not intended to replace advice given to you by your health care provider. Make sure you discuss any questions you have with your health care provider. Document Released: 07/24/2004 Document Revised: 06/18/2017 Document Reviewed: 06/18/2017 Elsevier Interactive Patient Education  2019 [employer].

## 2018-09-03 ENCOUNTER — Telehealth: Payer: Self-pay | Admitting: Gastroenterology

## 2018-09-03 LAB — CBC WITH DIFFERENTIAL/PLATELET
Absolute Monocytes: 614 cells/uL (ref 200–950)
Basophils Absolute: 52 cells/uL (ref 0–200)
Basophils Relative: 0.5 %
EOS ABS: 83 {cells}/uL (ref 15–500)
Eosinophils Relative: 0.8 %
HCT: 33.7 % — ABNORMAL LOW (ref 38.5–50.0)
Hemoglobin: 11.9 g/dL — ABNORMAL LOW (ref 13.2–17.1)
Lymphs Abs: 946 cells/uL (ref 850–3900)
MCH: 32.1 pg (ref 27.0–33.0)
MCHC: 35.3 g/dL (ref 32.0–36.0)
MCV: 90.8 fL (ref 80.0–100.0)
MPV: 10.8 fL (ref 7.5–12.5)
Monocytes Relative: 5.9 %
Neutro Abs: 8705 cells/uL — ABNORMAL HIGH (ref 1500–7800)
Neutrophils Relative %: 83.7 %
Platelets: 174 10*3/uL (ref 140–400)
RBC: 3.71 10*6/uL — ABNORMAL LOW (ref 4.20–5.80)
RDW: 12.8 % (ref 11.0–15.0)
Total Lymphocyte: 9.1 %
WBC: 10.4 10*3/uL (ref 3.8–10.8)

## 2018-09-03 LAB — COMPLETE METABOLIC PANEL WITH GFR
AG RATIO: 1.5 (calc) (ref 1.0–2.5)
ALT: 7 U/L — ABNORMAL LOW (ref 9–46)
AST: 12 U/L (ref 10–35)
Albumin: 4.1 g/dL (ref 3.6–5.1)
Alkaline phosphatase (APISO): 73 U/L (ref 35–144)
BUN: 12 mg/dL (ref 7–25)
CO2: 28 mmol/L (ref 20–32)
Calcium: 10 mg/dL (ref 8.6–10.3)
Chloride: 100 mmol/L (ref 98–110)
Creat: 0.86 mg/dL (ref 0.70–1.18)
GFR, EST AFRICAN AMERICAN: 96 mL/min/{1.73_m2} (ref >=60)
GFR, Est Non African American: 83 mL/min/{1.73_m2} (ref >=60)
Globulin: 2.7 g/dL (calc) (ref 1.9–3.7)
Glucose, Bld: 143 mg/dL — ABNORMAL HIGH (ref 65–99)
Potassium: 4.9 mmol/L (ref 3.5–5.3)
Sodium: 138 mmol/L (ref 135–146)
Total Bilirubin: 1 mg/dL (ref 0.2–1.2)
Total Protein: 6.8 g/dL (ref 6.1–8.1)

## 2018-09-03 LAB — URINALYSIS W MICROSCOPIC + REFLEX CULTURE
BACTERIA UA: NONE SEEN /HPF
BILIRUBIN URINE: NEGATIVE
Glucose, UA: NEGATIVE
Hgb urine dipstick: NEGATIVE
Hyaline Cast: NONE SEEN /LPF
Ketones, ur: NEGATIVE
Leukocyte Esterase: NEGATIVE
Nitrites, Initial: NEGATIVE
Protein, ur: NEGATIVE
SQUAMOUS EPITHELIAL / LPF: NONE SEEN /HPF (ref <=5)
Specific Gravity, Urine: 1.02 (ref 1.001–1.03)
WBC, UA: NONE SEEN /HPF (ref 0–5)
pH: 6 (ref 5.0–8.0)

## 2018-09-03 LAB — NO CULTURE INDICATED

## 2018-09-03 NOTE — Telephone Encounter (Signed)
Ok, please check back next week to see how he is doing.

## 2018-09-03 NOTE — Telephone Encounter (Signed)
Spoke with the daughter. She says her father is on antibiotics. Feels SOB when he tries to move around much. Afebrile. I have cancelled the EGD. The daughter is concerned he is not strong enough right now. Agrees to call me on Monday to give an update on his condition.

## 2018-09-03 NOTE — Telephone Encounter (Signed)
Pt's daughter [name] needs to r/s procedure scheduled on 3/10 at [facility] because pt has pneumonia.

## 2018-09-07 ENCOUNTER — Ambulatory Visit (HOSPITAL_COMMUNITY): Admission: RE | Admit: 2018-09-07 | Payer: Medicare Other | Source: Home / Self Care | Admitting: Gastroenterology

## 2018-09-07 SURGERY — EGD (ESOPHAGOGASTRODUODENOSCOPY)
Anesthesia: General

## 2018-09-07 NOTE — Telephone Encounter (Signed)
Doing better. Not pneumonia but maybe a COPD "flare." He sees his doctor tomorrow. The daughter agrees to call us when he gets the okay to schedule the EGD again.

## 2018-09-07 NOTE — Telephone Encounter (Signed)
Pt daughter Mrs.Karlton Lemon returned call

## 2018-09-07 NOTE — Telephone Encounter (Signed)
Left message to call with up date of his condition since being sick with URI.

## 2018-09-07 NOTE — Telephone Encounter (Signed)
Okay thank you

## 2018-09-08 ENCOUNTER — Other Ambulatory Visit: Payer: Self-pay

## 2018-09-08 ENCOUNTER — Ambulatory Visit (INDEPENDENT_AMBULATORY_CARE_PROVIDER_SITE_OTHER): Payer: Medicare Other | Admitting: Adult Health

## 2018-09-08 ENCOUNTER — Encounter: Payer: Self-pay | Admitting: Adult Health

## 2018-09-08 VITALS — BP 122/62 | HR 93 | Temp 97.7°F | Ht 65.0 in | Wt 130.0 lb

## 2018-09-08 DIAGNOSIS — Z79899 Other long term (current) drug therapy: Secondary | ICD-10-CM

## 2018-09-08 DIAGNOSIS — K222 Esophageal obstruction: Secondary | ICD-10-CM | POA: Diagnosis not present

## 2018-09-08 DIAGNOSIS — J449 Chronic obstructive pulmonary disease, unspecified: Secondary | ICD-10-CM | POA: Diagnosis not present

## 2018-09-08 LAB — CBC WITH DIFFERENTIAL/PLATELET
Absolute Monocytes: 407 cells/uL (ref 200–950)
Basophils Absolute: 50 cells/uL (ref 0–200)
Basophils Relative: 0.9 %
Eosinophils Absolute: 160 cells/uL (ref 15–500)
Eosinophils Relative: 2.9 %
HCT: 32.3 % — ABNORMAL LOW (ref 38.5–50.0)
HEMOGLOBIN: 11.2 g/dL — AB (ref 13.2–17.1)
Lymphs Abs: 759 cells/uL — ABNORMAL LOW (ref 850–3900)
MCH: 31.5 pg (ref 27.0–33.0)
MCHC: 34.7 g/dL (ref 32.0–36.0)
MCV: 90.7 fL (ref 80.0–100.0)
MPV: 10.6 fL (ref 7.5–12.5)
Monocytes Relative: 7.4 %
NEUTROS ABS: 4125 {cells}/uL (ref 1500–7800)
Neutrophils Relative %: 75 %
Platelets: 167 10*3/uL (ref 140–400)
RBC: 3.56 10*6/uL — AB (ref 4.20–5.80)
RDW: 12.9 % (ref 11.0–15.0)
Total Lymphocyte: 13.8 %
WBC: 5.5 10*3/uL (ref 3.8–10.8)

## 2018-09-08 MED ORDER — UMECLIDINIUM-VILANTEROL 62.5-25 MCG/INH IN AEPB: 1.0000 | INHALATION_SPRAY | Freq: Every day | RESPIRATORY_TRACT | 2 refills | Status: DC

## 2018-09-08 NOTE — Patient Instructions (Signed)
Chronic Obstructive Pulmonary Disease Exacerbation Chronic obstructive pulmonary disease (COPD) is a long-term (chronic) lung problem. In COPD, the flow of air from the lungs is limited. COPD exacerbations are times that breathing gets worse and you need more than your normal treatment. Without treatment, they can be life threatening. If they happen often, your lungs can become more damaged. If your COPD gets worse, your doctor may treat you with:  Medicines.  Oxygen.  Different ways to clear your airway, such as using a mask. Follow these instructions at home: Medicines  Take over-the-counter and prescription medicines only as told by your doctor.  If you take an antibiotic or steroid medicine, do not stop taking the medicine even if you start to feel better.  Keep up with shots (vaccinations) as told by your doctor. Be sure to get a yearly (annual) flu shot. Lifestyle  Do not smoke. If you need help quitting, ask your doctor.  Eat healthy foods.  Exercise regularly.  Get plenty of sleep.  Avoid tobacco smoke and other things that can bother your lungs.  Wash your hands often with soap and water. This will help keep you from getting an infection. If you cannot use soap and water, use hand sanitizer.  During flu season, avoid areas that are crowded with people. General instructions  Drink enough fluid to keep your pee (urine) clear or pale yellow. Do not do this if your doctor has told you not to.  Use a cool mist machine (vaporizer).  If you use oxygen or a machine that turns medicine into a mist (nebulizer), continue to use it as told.  Follow all instructions for rehabilitation. These are steps you can take to make your body work better.  Keep all follow-up visits as told by your doctor. This is important. Contact a doctor if:  Your COPD symptoms get worse than normal. Get help right away if:  You are short of breath and it gets worse.  You have trouble  talking.  You have chest pain.  You cough up blood.  You have a fever.  You keep throwing up (vomiting).  You feel weak or you pass out (faint).  You feel confused.  You are not able to sleep because of your symptoms.  You are not able to do daily activities. Summary  COPD exacerbations are times that breathing gets worse and you need more treatment than normal.  COPD exacerbations can be very serious and may cause your lungs to become more damaged.  Do not smoke. If you need help quitting, ask your doctor.  Stay up-to-date on your shots. Get a flu shot every year. This information is not intended to replace advice given to you by your health care provider. Make sure you discuss any questions you have with your health care provider. Document Released: 06/05/2011 Document Revised: 07/21/2016 Document Reviewed: 07/21/2016 Elsevier Interactive Patient Education  2019 [employer]; Vilanterol inhalation powder What is this medicine? UMECLIDINIUM; VILANTEROL (ue MEK li DIN ee um; vye LAN ter ol) inhalation is a combination of two medicines that decrease inflammation and help to open up the airways of your lungs. It is for chronic obstructive pulmonary disease (COPD), including chronic bronchitis or emphysema. Do NOT use for asthma or an acute asthma attack. Do NOT use for a COPD attack. This medicine may be used for other purposes; ask your health care provider or pharmacist if you have questions. COMMON BRAND NAME(S): ANORO ELLIPTA What should I tell my health  care provider before I take this medicine? They need to know if you have any of these conditions: -bladder problems or difficulty passing urine -diabetes -glaucoma -heart disease or irregular heartbeat -high blood pressure -kidney disease -pheochromocytoma -prostate disease -seizures -thyroid disease -an unusual or allergic reaction to umeclidinium, vilanterol, lactose, milk proteins, other medicines,  foods, dyes, or preservatives -pregnant or trying to get pregnant -breast-feeding How should I use this medicine? This medicine is inhaled through the mouth. It is used once per day. Follow the directions on the prescription label. Do not use a spacer device with this inhaler. Take your medicine at regular intervals. Do not take your medicine more often than directed. Do not stop taking except on your doctor's advice. Make sure that you are using your inhaler correctly. Ask you doctor or health care provider if you have any questions. Talk to your pediatrician regarding the use of this medicine in children. Special care may be needed. Overdosage: If you think you have taken too much of this medicine contact a poison control center or emergency room at once. NOTE: This medicine is only for you. Do not share this medicine with others. What if I miss a dose? If you miss a dose, use it as soon as you can. If it is almost time for your next dose, use only that dose and continue with your regular schedule. Do not use double or extra doses. What may interact with this medicine? Do not take this medicine with any of the following medications: -cisapride -dofetilide -dronedarone -MAOIs like Carbex, Eldepryl, Marplan, Nardil, and Parnate -pimozide -thioridazine -ziprasidone This medicine may also interact with the following medications: -antihistamines for allergy -antiviral medicines for HIV or AIDS -atropine -beta-blockers like metoprolol and propranolol -certain medicines for bladder problems like oxybutynin, tolterodine -certain medicines for depression, anxiety, or psychotic disturbances -certain medicines for Parkinson's disease like benztropine, trihexyphenidyl -certain medicines for stomach problems like dicyclomine, hyoscyamine -certain medicines for travel sickness like scopolamine -diuretics -ipratropium -medicines for colds -medicines for fungal infections like ketoconazole and  itraconazole -other medicines for breathing problems -other medicines that prolong the QT interval (cause an abnormal heart rhythm) -tiotropium This list may not describe all possible interactions. Give your health care provider a list of all the medicines, herbs, non-prescription drugs, or dietary supplements you use. Also tell them if you smoke, drink alcohol, or use illegal drugs. Some items may interact with your medicine. What should I watch for while using this medicine? Visit your doctor or health care professional for regular checkups. Tell your doctor or health care professional if your symptoms do not get better. If your symptoms get worse or if you need your short-acting inhalers more often, call your doctor right away. Do not use this medicine more than once every 24 hours. What side effects may I notice from receiving this medicine? Side effects that you should report to your doctor or health care professional as soon as possible: -allergic reactions like skin rash or hives, swelling of the face, lips, or tongue -breathing problems right after inhaling your medicine -changes in vision -chest pain -eye pain -fast, irregular heartbeat -feeling faint or lightheaded, falls -fever or chills -nausea, vomiting -trouble passing urine or change in the amount of urine Side effects that usually do not require medical attention (report to your doctor or health care professional if they continue or are bothersome): -constipation -cough -diarrhea -headache -muscle cramps -nervousness -sore throat -tremor This list may not describe all possible side effects. Call  your doctor for medical advice about side effects. You may report side effects to FDA at 1-800-FDA-1088. Where should I keep my medicine? Keep out of the reach of children. Store at room temperature between 15 and 30 degrees C (59 and 86 degrees F). Store in a dry place away from direct heat or sunlight. Throw away 6 weeks  after you remove the inhaler from the foil tray, or after the dose indicator reads 0, whichever comes first. Throw away any unopened packages after the expiration date. NOTE: This sheet is a summary. It may not cover all possible information. If you have questions about this medicine, talk to your doctor, pharmacist, or health care provider.  2019 Elsevier/Gold Standard (2017-11-30 14:47:24)

## 2018-09-08 NOTE — Progress Notes (Signed)
Assessment and Plan:  Sotirios was seen today for medical clearance.  Diagnoses and all orders for this visit:  Chronic obstructive pulmonary disease, unspecified COPD type ([place]) Presume confusion r/t excerbation of chronic bronchitis; resolved with levaquin; not currently taking daily inhaler, discussed and will initiate anoro  VS, lung sounds at baseline/ normal today Will recheck CBC but pending that cleared for surgery for EGD with esophageal dilation -     umeclidinium-vilanterol (ANORO ELLIPTA) 62.5-25 MCG/INH AEPB; Inhale 1 puff into the lungs daily. Make sure you rinse your mouth after each use.  Medication management -     CBC with Differential/Platelet  Esophageal stricture Currently continues with liquid diet Cleared for surgery by Dr. Silverio Decamp that was postponed due to respiratory sx with standard periop precautions  Further disposition pending results of labs. Discussed med's effects and SE's.   Over 15 minutes of exam, counseling, chart review, and critical decision making was performed.   Future Appointments  Date Time Provider [place]  09/21/2018 10:30 AM [facility] R/F 1 [facility] [place]  03/16/2019  9:00 AM Vicie Mutters, PA-C [facility] None  06/16/2019 10:30 AM [facility] CH ECHO 1 [facility]  06/20/2019 10:00 AM [facility] NL VASC 4 [facility]I [facility]  06/20/2019 11:00 AM [facility] NL VASC 4 [facility]    ------------------------------------------------------------------------------------------------------------------  HPI BP 122/62   Pulse 93   Temp 97.7 F (36.5 C)   Ht 5\' 5"  (1.651 m)   Wt 130 lb (59 kg)   SpO2 96%   BMI 21.63 kg/m   78 y.o.male former smoker with hx of COPD, htn, AS, GERD, esophageal stricture and achalasia presents for 1 week follow up on reported confusion and hallucinations intermittently at home. He also reported productive cough, strong urine odor at that time; abnormal lung sounds bilaterally were noted, he was  initiated on Levaquin 750 mg daily x 5 days pending lab workup for possible UTI/CAP. CBC showed mildly elevated WBC from baseline with neutrophilia compared to baseline, CMP unremarkable, UA normal other than uric acid crystals, CXR was negative for acute changes, but family reports confusion immediately improved within 2 days. Presumed COPD exacerbation. He is not currently prescribed a daily inhaler though reports he has taken in the past.   He was scheduled for EGD with esophageal dilation with Dr. Silverio Decamp on 09/07/2018 but this was postponed in light of possible respiratory infection. He presents today for recheck, surgical clearance. Patient and family deny any sx today; report he is at his baseline.   Lab Results  Component Value Date   WBC 10.4 09/02/2018   HGB 11.9 (L) 09/02/2018   HCT 33.7 (L) 09/02/2018   MCV 90.8 09/02/2018   PLT 174 09/02/2018    BMI is Body mass index is 21.63 kg/m., he has remained on liquid diet due to severe esophageal stricture.  Wt Readings from Last 3 Encounters:  09/08/18 130 lb (59 kg)  09/02/18 129 lb (58.5 kg)  08/19/18 136 lb (61.7 kg)      Past Medical History:  Diagnosis Date  . AAA (abdominal aortic aneurysm) ([place])   . Achalasia   . Carotid stenosis, asymptomatic, bilateral    per duplex --- bilateral <50% ICA  . COPD (chronic obstructive pulmonary disease) ([place])    pulmologist-  dr [name]  . GERD (gastroesophageal reflux disease)   . Hyperlipidemia   . Hypertension   . Hypothyroidism   . Lesion of left lung    followed by dr [name] (pulmologist)  post infection  scarring  . Mild dementia ([place])   . OSA (obstructive sleep apnea)    has a cpap-  intolerant; due to Claustaphobia (01/25/2018)  . Peripheral vascular disease [facility])    cardiologist-- dr berry-- per last ABI/Aorta/Iliac 06-09-2016  patent bilateral iliac kissing stents, >50% stenosis bilateral iliac artery (R>L) and left external iliac artery, patent IVC  . Prostate cancer [facility])     urologist-  dr [name] oncologist-  dr [name]. Dx'd 2018 s/p XRT  . RLS (restless legs syndrome)   . S/P TAVR (transcatheter aortic valve replacement)    s/p TAVR via transapical approach with an Edwards Sapien 3 THV (size 26 mm, model # 9600TFX, serial # [id number])  . Schatzki's ring   . Severe aortic stenosis      Allergies  Allergen Reactions  . Penicillins Anaphylaxis, Itching, Swelling and Other (See Comments)  . Sulfa Antibiotics Anaphylaxis and Swelling  . Versed [Midazolam] Other (See Comments)    Confusion, delerium    Current Outpatient Medications on File Prior to Visit  Medication Sig  . albuterol (PROVENTIL HFA;VENTOLIN HFA) 108 (90 Base) MCG/ACT inhaler Inhale 1 puff into the lungs every 6 (six) hours as needed for wheezing or shortness of breath.  . alfuzosin (UROXATRAL) 10 MG 24 hr tablet Take 10 mg by mouth daily. After lunch  . apixaban (ELIQUIS) 2.5 MG TABS tablet Take 1 tablet (2.5 mg total) by mouth 2 (two) times daily.  [name] aspirin EC 81 MG tablet Take 81 mg by mouth daily.  . Cholecalciferol (VITAMIN D-3) 5000 units TABS Take 5,000 Units by mouth daily at 3 pm.  . feeding supplement, ENSURE ENLIVE, (ENSURE ENLIVE) LIQD Take 237 mLs by mouth 3 (three) times daily between meals.  . fluticasone (FLONASE) 50 MCG/ACT nasal spray Place 2 sprays into both nostrils daily as needed for allergies or rhinitis.   [name] levothyroxine (SYNTHROID, LEVOTHROID) 50 MCG tablet TAKE 1 TABLET BY MOUTH ONCE DAILY  . omeprazole (PRILOSEC) 40 MG capsule Take 1 capsule (40 mg total) by mouth 2 (two) times daily. TAKE 1 CAPSULE BY MOUTH TWICE A DAY  . promethazine-dextromethorphan (PROMETHAZINE-DM) 6.25-15 MG/5ML syrup Take 5 mLs by mouth 4 (four) times daily as needed for cough.   Current Facility-Administered Medications on File Prior to Visit  Medication  . 0.9 %  sodium chloride infusion    ROS: all negative except above.   Physical Exam:  BP 122/62   Pulse 93   Temp 97.7 F (36.5  C)   Ht 5\' 5"  (1.651 m)   Wt 130 lb (59 kg)   SpO2 96%   BMI 21.63 kg/m   General Appearance: Well nourished, in no apparent distress. Eyes: PERRLA, EOMs, conjunctiva no swelling or erythema Sinuses: No Frontal/maxillary tenderness ENT/Mouth: Ext aud canals clear, TMs without erythema, bulging. No erythema, swelling, or exudate on post pharynx.  Tonsils not swollen or erythematous. Hearing normal.  Neck: Supple, thyroid normal.  Respiratory: Respiratory effort normal, BS equal bilaterally without rales, rhonchi, wheezing or stridor.  Cardio: RRR with no MRGs. Symmetrical peripheral pulses without edema.  Abdomen: Soft, + BS.  Non tender, no guarding, rebound, hernias, masses. Lymphatics: Non tender without lymphadenopathy.  Musculoskeletal: Full ROM, 5/5 strength, normal gait.  Skin: Warm, dry without rashes, lesions, ecchymosis.  Neuro: Cranial nerves intact. Normal muscle tone, no cerebellar symptoms. Sensation intact.  Psych: Awake and oriented X 3, normal affect, Insight and Judgment appropriate.     Izora Ribas, NP 3:10 PM [facility]

## 2018-09-16 ENCOUNTER — Telehealth: Payer: Self-pay | Admitting: *Deleted

## 2018-09-16 NOTE — Telephone Encounter (Signed)
Patient is currently scheduled for barium esophagram on 09/21/2018 at [facility] for ?achalasia. Had recent endoscopy. Patient with COPD. Due to recent COVID-19 pandemic, [facility] has requested that all non-emergent cases be cancelled/rescheduled at this time. Please advise as to whether this procedure can be delayed at least 4 weeks.

## 2018-09-19 NOTE — Telephone Encounter (Signed)
Please cancel the barium esophagogram. Thanks

## 2018-09-20 ENCOUNTER — Telehealth: Payer: Self-pay

## 2018-09-20 NOTE — Telephone Encounter (Signed)
Barium study was initially scheduled to follow pneumatic balloon dilation for achalasia, which has been canceled. Please cancel the barium esophagram.  Will need recall to follow-up in 4 weeks and reschedule the procedure. Thank you

## 2018-09-20 NOTE — Telephone Encounter (Signed)
#  [phone]  [name] returning your call

## 2018-09-20 NOTE — Telephone Encounter (Signed)
Left message for patient to call back. I have cancelled barium esophagram study for now. Recall placed in system for 4 weeks.

## 2018-09-20 NOTE — Telephone Encounter (Signed)
Spoke to patient's daughter, [name] to advise that as of right now, we are cancelling barium esophagram until we are able to reschedule endoscopy with pneumatic balloon dilation. She verbalizes understanding.

## 2018-09-20 NOTE — Telephone Encounter (Signed)
Cancel all together or delay?

## 2018-09-20 NOTE — Telephone Encounter (Signed)
Cathy calling about patient's inhaler. It's over $400.00 at the pharmacy. He has almost finished the sample that was given to him at his visit. Requesting another option at this point. Please advise.

## 2018-09-21 ENCOUNTER — Ambulatory Visit (HOSPITAL_COMMUNITY): Payer: Medicare Other

## 2018-09-21 NOTE — Telephone Encounter (Signed)
LMTCB

## 2018-10-21 NOTE — Telephone Encounter (Signed)
Patient has been scheduled for a telephone visit with Dr. Silverio Decamp on 11/10/18 to discuss rescheduling of EGD/ BS.  Appts scheduled with the patient's daughter [name]

## 2018-10-22 ENCOUNTER — Ambulatory Visit (HOSPITAL_COMMUNITY): Payer: Medicare Other

## 2018-11-09 ENCOUNTER — Encounter: Payer: Self-pay | Admitting: General Surgery

## 2018-11-10 ENCOUNTER — Encounter: Payer: Self-pay | Admitting: Gastroenterology

## 2018-11-10 ENCOUNTER — Other Ambulatory Visit: Payer: Self-pay

## 2018-11-10 ENCOUNTER — Telehealth: Payer: Self-pay | Admitting: *Deleted

## 2018-11-10 ENCOUNTER — Other Ambulatory Visit: Payer: Self-pay | Admitting: Gastroenterology

## 2018-11-10 ENCOUNTER — Ambulatory Visit (INDEPENDENT_AMBULATORY_CARE_PROVIDER_SITE_OTHER): Payer: Medicare Other | Admitting: Gastroenterology

## 2018-11-10 VITALS — Ht 65.0 in | Wt 130.0 lb

## 2018-11-10 DIAGNOSIS — R634 Abnormal weight loss: Secondary | ICD-10-CM

## 2018-11-10 DIAGNOSIS — K220 Achalasia of cardia: Secondary | ICD-10-CM | POA: Diagnosis not present

## 2018-11-10 DIAGNOSIS — E43 Unspecified severe protein-calorie malnutrition: Secondary | ICD-10-CM | POA: Diagnosis not present

## 2018-11-10 DIAGNOSIS — R1310 Dysphagia, unspecified: Secondary | ICD-10-CM

## 2018-11-10 DIAGNOSIS — R1319 Other dysphagia: Secondary | ICD-10-CM

## 2018-11-10 NOTE — Telephone Encounter (Signed)
   Primary Cardiologist: Quay Burow, MD  Chart reviewed as part of pre-operative protocol coverage. Patient was contacted 11/10/2018 in reference to pre-operative risk assessment for pending surgery as outlined below.  Robert Schaefer was last seen on 06/15/18 by Dr. [name].  Since that day, Robert Schaefer has done well from a cardiac.  His mobility is limited but heart catheterization 01/25/2018 showed normal coronaries and critical left ear.  He has since undergone TAVR by our service.  He is done well he has discomfort after eating, which is being worked up by GI.  Per our pharmacy staff: Pt takes Eliquis for afib with CHADS2VASc score of 4 (age x2, HTN, CAD/PVD). Renal function is normal. Ok to hold Eliquis for 2 days prior to procedure.  Therefore, based on ACC/AHA guidelines, the patient would be at acceptable risk for the planned procedure without further cardiovascular testing.   I will route this recommendation to the requesting party via Epic fax function and remove from pre-op pool.  Please call with questions.  [place], PA 11/10/2018, 4:24 PM

## 2018-11-10 NOTE — Progress Notes (Signed)
Robert Schaefer    [id number]    1940-09-17  Primary Care Physician:McKeown, Gwyndolyn Saxon, MD  Referring Physician: [name], MD [address] [place], [place] [address]  This service was provided via audio only  telemedicine due to [place] 19 pandemic.  Patient location: Home Provider location: Office Used 2 patient identifiers to confirm the correct person. Explained the limitations in evaluation and management via telemedicine. Patient is aware of potential medical charges for this visit.  Patient consented to this virtual visit.  The persons participating in this telemedicine service were myself and the patient  Interactive audio and video telecommunications were attempted between this provider and patient, however failed, due to patient having technical difficulties OR patient did not have access to video capability. We continued and completed visit with audio only.    Chief complaint: Dysphagia  HPI:  78 year old male with a GJ outflow obstruction, findings concerning for achalasia. EGD February 2020: Esophagus filled with residual food, procedure was aborted.  He was scheduled for EGD with pneumatic balloon dilation at [facility] endoscopy under general anesthesia but was canceled was admitted with pneumonia. Procedure was further delayed due to current pandemic. Continues to have significant dysphagia, currently only tolerating full liquid diet.  He has lost 9 pounds in the past few weeks. He is only able to drink limited amounts as he regurgitates most of it back.  Other medical history includes severe aortic stenosis status post TAVR. Eliquis for chronic anticoagulation  EGD 07/10/17 - 2cm HH, large diverticulum at distal esophagus, associated GEJ stenosis, dilated to 73mm EGD 08/18/17 - 3cm HH, large diverticulum at distal esophagus, GEJ stenosis dilated to 14.71mm Barium swallow 09/03/17 - high grade stricture at GEJ, R epiphrenic  diverticulum 5cm in maximal diameter with wide neck, nonspecific dysmotility EGD 10/12/17 - GEJ stricture dilated to 15mm, large diverticulum, placement of manometry catheter Manometry 10/12/17 - c/w type II achalasia, referred him to surgery  Outpatient Encounter Medications as of 11/10/2018  Medication Sig  . albuterol (PROVENTIL HFA;VENTOLIN HFA) 108 (90 Base) MCG/ACT inhaler Inhale 1 puff into the lungs every 6 (six) hours as needed for wheezing or shortness of breath.  . alfuzosin (UROXATRAL) 10 MG 24 hr tablet Take 10 mg by mouth daily. After lunch  . apixaban (ELIQUIS) 2.5 MG TABS tablet Take 1 tablet (2.5 mg total) by mouth 2 (two) times daily.  [name] aspirin EC 81 MG tablet Take 81 mg by mouth daily.  . Cholecalciferol (VITAMIN D-3) 5000 units TABS Take 5,000 Units by mouth daily at 3 pm.  . feeding supplement, ENSURE ENLIVE, (ENSURE ENLIVE) LIQD Take 237 mLs by mouth 3 (three) times daily between meals.  . fluticasone (FLONASE) 50 MCG/ACT nasal spray Place 2 sprays into both nostrils daily as needed for allergies or rhinitis.   [name] levothyroxine (SYNTHROID, LEVOTHROID) 50 MCG tablet TAKE 1 TABLET BY MOUTH ONCE DAILY  . omeprazole (PRILOSEC) 40 MG capsule Take 1 capsule (40 mg total) by mouth 2 (two) times daily. TAKE 1 CAPSULE BY MOUTH TWICE A DAY  . promethazine-dextromethorphan (PROMETHAZINE-DM) 6.25-15 MG/5ML syrup Take 5 mLs by mouth 4 (four) times daily as needed for cough.  . umeclidinium-vilanterol (ANORO ELLIPTA) 62.5-25 MCG/INH AEPB Inhale 1 puff into the lungs daily. Make sure you rinse your mouth after each use.   Facility-Administered Encounter Medications as of 11/10/2018  Medication  . 0.9 %  sodium chloride infusion    Allergies as of  11/10/2018 - Review Complete 11/10/2018  Allergen Reaction Noted  . Penicillins Anaphylaxis, Itching, Swelling, and Other (See Comments) 07/04/2013  . Sulfa antibiotics Anaphylaxis and Swelling 07/04/2013  . Versed [midazolam] Other (See  Comments) 05/31/2016    Past Medical History:  Diagnosis Date  . AAA (abdominal aortic aneurysm) ([place])   . Achalasia   . Carotid stenosis, asymptomatic, bilateral    per duplex --- bilateral <50% ICA  . COPD (chronic obstructive pulmonary disease) ([place])    pulmologist-  dr [name]  . GERD (gastroesophageal reflux disease)   . Hyperlipidemia   . Hypertension   . Hypothyroidism   . Lesion of left lung    followed by dr [name] (pulmologist)  post infection scarring  . Mild dementia ([place])   . OSA (obstructive sleep apnea)    has a cpap-  intolerant; due to Claustaphobia (01/25/2018)  . Peripheral vascular disease [facility])    cardiologist-- dr berry-- per last ABI/Aorta/Iliac 06-09-2016  patent bilateral iliac kissing stents, >50% stenosis bilateral iliac artery (R>L) and left external iliac artery, patent IVC  . Prostate cancer [facility])    urologist-  dr [name] oncologist-  dr [name]. Dx'd 2018 s/p XRT  . RLS (restless legs syndrome)   . S/P TAVR (transcatheter aortic valve replacement)    s/p TAVR via transapical approach with an Edwards Sapien 3 THV (size 26 mm, model # 9600TFX, serial # [id number])  . Schatzki's ring   . Severe aortic stenosis     Past Surgical History:  Procedure Laterality Date  . ABDOMINAL AORTOGRAM N/A 01/25/2018   Procedure: ABDOMINAL AORTOGRAM;  Surgeon: [name], MD;  Location: [place];  Service: Cardiovascular;  Laterality: N/A;  . ANAL FISTULECTOMY  2010  . CARDIAC CATHETERIZATION Bilateral 01/25/2018  . CARDIOVASCULAR STRESS TEST  04-23-2016   dr berry   normal nuclear study w/ no ischemia/  normal LV function and wall function, nuclear stress ef 57%  . CATARACT EXTRACTION W/ INTRAOCULAR LENS  IMPLANT, BILATERAL Bilateral   . COLONOSCOPY    . ESOPHAGEAL MANOMETRY N/A 10/12/2017   Procedure: ESOPHAGEAL MANOMETRY (EM);  Surgeon: [name], MD;  [facility];  Service: Gastroenterology;  Laterality: N/A;  .  ESOPHAGOGASTRODUODENOSCOPY N/A 10/12/2017   Procedure: ESOPHAGOGASTRODUODENOSCOPY (EGD);  Surgeon: [name], MD;  Location: [facility];  Service: Gastroenterology;  Laterality: N/A;  . ESOPHAGOGASTRODUODENOSCOPY (EGD) WITH ESOPHAGEAL DILATION  last one 11-09-2015  dr Yolanda Bonine   dilation schatzki's ring  . GOLD SEED IMPLANT N/A 04/29/2017   Procedure: GOLD SEED IMPLANT;  Surgeon: [name], MD;  [facility];  Service: Urology;  Laterality: N/A;  . HERNIA REPAIR    . INGUINAL HERNIA REPAIR Right 02/23/2014   Procedure: HERNIA REPAIR INGUINAL ADULT;  Surgeon: Joyice Faster. Cornett, MD;  Location: [facility];  Service: General;  Laterality: Right;  . INSERTION OF MESH Right 02/23/2014   Procedure: INSERTION OF MESH;  Surgeon: Joyice Faster. Cornett, MD;  Location: [facility];  Service: General;  Laterality: Right;  . PERIPHERAL VASCULAR CATHETERIZATION N/A 05/29/2016   Procedure: Lower Extremity Angiography;  Surgeon: [name], MD;  Location: [facility];  Service: Cardiovascular;  Laterality: N/A; [place] orbital rotational atherectomy, PTA and stenting bilateral common iliac arteries   . PILONIDAL CYST / SINUS EXCISION  2000  . PROSTATE BIOPSY  09-22-2016  dr [name] office  . RIGHT/LEFT HEART CATH AND CORONARY ANGIOGRAPHY N/A 01/25/2018   Procedure: RIGHT/LEFT  HEART CATH AND CORONARY ANGIOGRAPHY;  Surgeon: [name], MD;  Location: [facility];  Service: Cardiovascular;  Laterality: N/A;  . SAVORY DILATION N/A 10/12/2017   Procedure: SAVORY DILATION;  Surgeon: [name], MD;  [facility];  Service: Gastroenterology;  Laterality: N/A;  . SPACE OAR INSTILLATION N/A 04/29/2017   Procedure: SPACE OAR INSTILLATION;  Surgeon: [name], MD;  [facility];  Service: Urology;  Laterality: N/A;  . TRANSCATHETER AORTIC VALVE REPLACEMENT, TRANSAPICAL Left 03/16/2018    Procedure: TRANSCATHETER AORTIC VALVE REPLACEMENT, TRANSAPICAL;  Surgeon: Sherren Mocha, MD;  Location: [facility];  Service: Open Heart Surgery;  Laterality: Left;  . TRANSTHORACIC ECHOCARDIOGRAM  05-05-2016   dr berry   ef 16-10%,  grade 1diastolic dysfunction/  moderate AV stenosis with mod. AR (right coronary and noncoronary cusp mobility seversly restricted);  valve area 1.25cm^2, mean gradient 27 mmHg, peak gradient 34mmHg/  mild MR  . UPPER GASTROINTESTINAL ENDOSCOPY      Family History  Problem Relation Age of Onset  . Heart attack Mother   . Stroke Father   . Asthma Father   . Cancer Neg Hx   . Colon cancer Neg Hx   . Stomach cancer Neg Hx   . Esophageal cancer Neg Hx   . Rectal cancer Neg Hx   . Pancreatic cancer Neg Hx   . Prostate cancer Neg Hx     Social History   Socioeconomic History  . Marital status: Married    Spouse name: Not on file  . Number of children: Not on file  . Years of education: Not on file  . Highest education level: Not on file  Occupational History  . Not on file  Social Needs  . Financial resource strain: Not on file  . Food insecurity:    Worry: Not on file    Inability: Not on file  . Transportation needs:    Medical: Not on file    Non-medical: Not on file  Tobacco Use  . Smoking status: Former Smoker    Packs/day: 1.00    Years: 50.00    Pack years: 50.00    Types: Cigarettes    Last attempt to quit: 07/05/2005    Years since quitting: 13.3  . Smokeless tobacco: Never Used  Substance and Sexual Activity  . Alcohol use: No  . Drug use: Never  . Sexual activity: Not Currently  Lifestyle  . Physical activity:    Days per week: Not on file    Minutes per session: Not on file  . Stress: Not on file  Relationships  . Social connections:    Talks on phone: Not on file    Gets together: Not on file    Attends religious service: Not on file    Active member of club or organization: Not on file    Attends meetings of clubs or  organizations: Not on file    Relationship status: Not on file  . Intimate partner violence:    Fear of current or ex partner: Not on file    Emotionally abused: Not on file    Physically abused: Not on file    Forced sexual activity: Not on file  Other Topics Concern  . Not on file  Social History Narrative  . Not on file      Review of systems: Review of Systems as per HPI All other systems reviewed and are negative.   Physical Exam: Vitals were not  taken and physical exam was not performed during this virtual visit.  Data Reviewed:  Reviewed labs, radiology imaging, old records and pertinent past GI work up   Assessment and Plan/Recommendations:  78 year old male with dysphagia, high-grade EG junction outflow obstruction likely secondary to achalasia  Plan for EGD with pneumatic balloon dilation under fluoroscopy or Botox injection if unable to proceed with pneumatic dilation, to be scheduled at [facility] endoscopy unit  Esophagus was filled with food debris on EGD February 2020.  Will need general anesthesia.  Will obtain clearance from cardiology if okay to hold Eliquis for 2 days prior to the procedure.  Continue protein shakes and pured diet as tolerated, small frequent meals  Antireflux measures  The risks and benefits as well as alternatives of endoscopic procedure(s) have been discussed and reviewed. All questions answered. The patient agrees to proceed.     Damaris Hippo , MD   CC: [name], MD

## 2018-11-10 NOTE — Telephone Encounter (Signed)
Pt takes Eliquis for afib with CHADS2VASc score of 4 (age x2, HTN, CAD/PVD). Renal function is normal. Ok to hold Eliquis for 2 days prior to procedure.

## 2018-11-10 NOTE — Patient Instructions (Addendum)
Plan for EGD with pneumatic balloon dilation under fluoroscopy or Botox injection if unable to proceed with pneumatic dilation, to be scheduled at Hospital endoscopy unit with general anesthesia. You will have a radiology study preformed after the Endoscopy   You will need COVID testing done on Friday the 15th before your procedure at [facility]  Hours 8am-3pm   Will obtain clearance from cardiology if okay to hold Eliquis for 2 days prior to the procedure. We will contact you with approval to hold Eliquis   Continue protein shakes and pured diet as tolerated, small frequent meals  Antireflux measures   Gastroesophageal Reflux Disease, Adult Gastroesophageal reflux (GER) happens when acid from the stomach flows up into the tube that connects the mouth and the stomach (esophagus). Normally, food travels down the esophagus and stays in the stomach to be digested. However, when a person has GER, food and stomach acid sometimes move back up into the esophagus. If this becomes a more serious problem, the person may be diagnosed with a disease called gastroesophageal reflux disease (GERD). GERD occurs when the reflux:  Happens often.  Causes frequent or severe symptoms.  Causes problems such as damage to the esophagus. When stomach acid comes in contact with the esophagus, the acid may cause soreness (inflammation) in the esophagus. Over time, GERD may create small holes (ulcers) in the lining of the esophagus. What are the causes? This condition is caused by a problem with the muscle between the esophagus and the stomach (lower esophageal sphincter, or LES). Normally, the LES muscle closes after food passes through the esophagus to the stomach. When the LES is weakened or abnormal, it does not close properly, and that allows food and stomach acid to go back up into the esophagus. The LES can be weakened by certain dietary substances, medicines, and medical conditions,  including:  Tobacco use.  Pregnancy.  Having a hiatal hernia.  Alcohol use.  Certain foods and beverages, such as coffee, chocolate, onions, and peppermint. What increases the risk? You are more likely to develop this condition if you:  Have an increased body weight.  Have a connective tissue disorder.  Use NSAID medicines. What are the signs or symptoms? Symptoms of this condition include:  Heartburn.  Difficult or painful swallowing.  The feeling of having a lump in the throat.  Abitter taste in the mouth.  Bad breath.  Having a large amount of saliva.  Having an upset or bloated stomach.  Belching.  Chest pain. Different conditions can cause chest pain. Make sure you see your health care provider if you experience chest pain.  Shortness of breath or wheezing.  Ongoing (chronic) cough or a night-time cough.  Wearing away of tooth enamel.  Weight loss. How is this diagnosed? Your health care provider will take a medical history and perform a physical exam. To determine if you have mild or severe GERD, your health care provider may also monitor how you respond to treatment. You may also have tests, including:  A test to examine your stomach and esophagus with a small camera (endoscopy).  A test thatmeasures the acidity level in your esophagus.  A test thatmeasures how much pressure is on your esophagus.  A barium swallow or modified barium swallow test to show the shape, size, and functioning of your esophagus. How is this treated? The goal of treatment is to help relieve your symptoms and to prevent complications. Treatment for this condition may vary depending on  how severe your symptoms are. Your health care provider may recommend:  Changes to your diet.  Medicine.  Surgery. Follow these instructions at home: Eating and drinking   Follow a diet as recommended by your health care provider. This may involve avoiding foods and drinks such  as: ? Coffee and tea (with or without caffeine). ? Drinks that containalcohol. ? Energy drinks and sports drinks. ? Carbonated drinks or sodas. ? Chocolate and cocoa. ? Peppermint and mint flavorings. ? Garlic and onions. ? Horseradish. ? Spicy and acidic foods, including peppers, chili powder, curry powder, vinegar, hot sauces, and barbecue sauce. ? Citrus fruit juices and citrus fruits, such as oranges, lemons, and limes. ? Tomato-based foods, such as red sauce, chili, salsa, and pizza with red sauce. ? Fried and fatty foods, such as donuts, french fries, potato chips, and high-fat dressings. ? High-fat meats, such as hot dogs and fatty cuts of red and white meats, such as rib eye steak, sausage, ham, and bacon. ? High-fat dairy items, such as whole milk, butter, and cream cheese.  Eat small, frequent meals instead of large meals.  Avoid drinking large amounts of liquid with your meals.  Avoid eating meals during the 2-3 hours before bedtime.  Avoid lying down right after you eat.  Do not exercise right after you eat. Lifestyle   Do not use any products that contain nicotine or tobacco, such as cigarettes, e-cigarettes, and chewing tobacco. If you need help quitting, ask your health care provider.  Try to reduce your stress by using methods such as yoga or meditation. If you need help reducing stress, ask your health care provider.  If you are overweight, reduce your weight to an amount that is healthy for you. Ask your health care provider for guidance about a safe weight loss goal. General instructions  Pay attention to any changes in your symptoms.  Take over-the-counter and prescription medicines only as told by your health care provider. Do not take aspirin, ibuprofen, or other NSAIDs unless your health care provider told you to do so.  Wear loose-fitting clothing. Do not wear anything tight around your waist that causes pressure on your abdomen.  Raise (elevate) the  head of your bed about 6 inches (15 cm).  Avoid bending over if this makes your symptoms worse.  Keep all follow-up visits as told by your health care provider. This is important. Contact a health care provider if:  You have: ? New symptoms. ? Unexplained weight loss. ? Difficulty swallowing or it hurts to swallow. ? Wheezing or a persistent cough. ? A hoarse voice.  Your symptoms do not improve with treatment. Get help right away if you:  Have pain in your arms, neck, jaw, teeth, or back.  Feel sweaty, dizzy, or light-headed.  Have chest pain or shortness of breath.  Vomit and your vomit looks like blood or coffee grounds.  Faint.  Have stool that is bloody or black.  Cannot swallow, drink, or eat. Summary  Gastroesophageal reflux happens when acid from the stomach flows up into the esophagus. GERD is a disease in which the reflux happens often, causes frequent or severe symptoms, or causes problems such as damage to the esophagus.  Treatment for this condition may vary depending on how severe your symptoms are. Your health care provider may recommend diet and lifestyle changes, medicine, or surgery.  Contact a health care provider if you have new or worsening symptoms.  Take over-the-counter and prescription medicines only as told  by your health care provider. Do not take aspirin, ibuprofen, or other NSAIDs unless your health care provider told you to do so.  Keep all follow-up visits as told by your health care provider. This is important. This information is not intended to replace advice given to you by your health care provider. Make sure you discuss any questions you have with your health care provider. Document Released: 03/26/2005 Document Revised: 12/23/2017 Document Reviewed: 12/23/2017 Elsevier Interactive Patient Education  2019 [employer].

## 2018-11-10 NOTE — Telephone Encounter (Signed)
Please comment on holding eliquis

## 2018-11-10 NOTE — H&P (View-Only) (Signed)
Robert Schaefer    [id number]    Jan 20, 1941  Primary Care Physician:McKeown, Gwyndolyn Saxon, MD  Referring Physician: [name], MD [address] [place], [place] [address]  This service was provided via audio only  telemedicine due to [place] 19 pandemic.  Patient location: Home Provider location: Office Used 2 patient identifiers to confirm the correct person. Explained the limitations in evaluation and management via telemedicine. Patient is aware of potential medical charges for this visit.  Patient consented to this virtual visit.  The persons participating in this telemedicine service were myself and the patient  Interactive audio and video telecommunications were attempted between this provider and patient, however failed, due to patient having technical difficulties OR patient did not have access to video capability. We continued and completed visit with audio only.    Chief complaint: Dysphagia  HPI:  78 year old male with a GJ outflow obstruction, findings concerning for achalasia. EGD February 2020: Esophagus filled with residual food, procedure was aborted.  He was scheduled for EGD with pneumatic balloon dilation at [facility] endoscopy under general anesthesia but was canceled was admitted with pneumonia. Procedure was further delayed due to current pandemic. Continues to have significant dysphagia, currently only tolerating full liquid diet.  He has lost 9 pounds in the past few weeks. He is only able to drink limited amounts as he regurgitates most of it back.  Other medical history includes severe aortic stenosis status post TAVR. Eliquis for chronic anticoagulation  EGD 07/10/17 - 2cm HH, large diverticulum at distal esophagus, associated GEJ stenosis, dilated to 4mm EGD 08/18/17 - 3cm HH, large diverticulum at distal esophagus, GEJ stenosis dilated to 14.30mm Barium swallow 09/03/17 - high grade stricture at GEJ, R epiphrenic  diverticulum 5cm in maximal diameter with wide neck, nonspecific dysmotility EGD 10/12/17 - GEJ stricture dilated to 74mm, large diverticulum, placement of manometry catheter Manometry 10/12/17 - c/w type II achalasia, referred him to surgery  Outpatient Encounter Medications as of 11/10/2018  Medication Sig  . albuterol (PROVENTIL HFA;VENTOLIN HFA) 108 (90 Base) MCG/ACT inhaler Inhale 1 puff into the lungs every 6 (six) hours as needed for wheezing or shortness of breath.  . alfuzosin (UROXATRAL) 10 MG 24 hr tablet Take 10 mg by mouth daily. After lunch  . apixaban (ELIQUIS) 2.5 MG TABS tablet Take 1 tablet (2.5 mg total) by mouth 2 (two) times daily.  [name] aspirin EC 81 MG tablet Take 81 mg by mouth daily.  . Cholecalciferol (VITAMIN D-3) 5000 units TABS Take 5,000 Units by mouth daily at 3 pm.  . feeding supplement, ENSURE ENLIVE, (ENSURE ENLIVE) LIQD Take 237 mLs by mouth 3 (three) times daily between meals.  . fluticasone (FLONASE) 50 MCG/ACT nasal spray Place 2 sprays into both nostrils daily as needed for allergies or rhinitis.   [name] levothyroxine (SYNTHROID, LEVOTHROID) 50 MCG tablet TAKE 1 TABLET BY MOUTH ONCE DAILY  . omeprazole (PRILOSEC) 40 MG capsule Take 1 capsule (40 mg total) by mouth 2 (two) times daily. TAKE 1 CAPSULE BY MOUTH TWICE A DAY  . promethazine-dextromethorphan (PROMETHAZINE-DM) 6.25-15 MG/5ML syrup Take 5 mLs by mouth 4 (four) times daily as needed for cough.  . umeclidinium-vilanterol (ANORO ELLIPTA) 62.5-25 MCG/INH AEPB Inhale 1 puff into the lungs daily. Make sure you rinse your mouth after each use.   Facility-Administered Encounter Medications as of 11/10/2018  Medication  . 0.9 %  sodium chloride infusion    Allergies as of  11/10/2018 - Review Complete 11/10/2018  Allergen Reaction Noted  . Penicillins Anaphylaxis, Itching, Swelling, and Other (See Comments) 07/04/2013  . Sulfa antibiotics Anaphylaxis and Swelling 07/04/2013  . Versed [midazolam] Other (See  Comments) 05/31/2016    Past Medical History:  Diagnosis Date  . AAA (abdominal aortic aneurysm) ([place])   . Achalasia   . Carotid stenosis, asymptomatic, bilateral    per duplex --- bilateral <50% ICA  . COPD (chronic obstructive pulmonary disease) ([place])    pulmologist-  dr [name]  . GERD (gastroesophageal reflux disease)   . Hyperlipidemia   . Hypertension   . Hypothyroidism   . Lesion of left lung    followed by dr [name] (pulmologist)  post infection scarring  . Mild dementia ([place])   . OSA (obstructive sleep apnea)    has a cpap-  intolerant; due to Claustaphobia (01/25/2018)  . Peripheral vascular disease [facility])    cardiologist-- dr berry-- per last ABI/Aorta/Iliac 06-09-2016  patent bilateral iliac kissing stents, >50% stenosis bilateral iliac artery (R>L) and left external iliac artery, patent IVC  . Prostate cancer [facility])    urologist-  dr [name] oncologist-  dr [name]. Dx'd 2018 s/p XRT  . RLS (restless legs syndrome)   . S/P TAVR (transcatheter aortic valve replacement)    s/p TAVR via transapical approach with an Edwards Sapien 3 THV (size 26 mm, model # 9600TFX, serial # [id number])  . Schatzki's ring   . Severe aortic stenosis     Past Surgical History:  Procedure Laterality Date  . ABDOMINAL AORTOGRAM N/A 01/25/2018   Procedure: ABDOMINAL AORTOGRAM;  Surgeon: [name], MD;  Location: [facility];  Service: Cardiovascular;  Laterality: N/A;  . ANAL FISTULECTOMY  2010  . CARDIAC CATHETERIZATION Bilateral 01/25/2018  . CARDIOVASCULAR STRESS TEST  04-23-2016   dr berry   normal nuclear study w/ no ischemia/  normal LV function and wall function, nuclear stress ef 57%  . CATARACT EXTRACTION W/ INTRAOCULAR LENS  IMPLANT, BILATERAL Bilateral   . COLONOSCOPY    . ESOPHAGEAL MANOMETRY N/A 10/12/2017   Procedure: ESOPHAGEAL MANOMETRY (EM);  Surgeon: [name], MD;  Location: [facility];  Service: Gastroenterology;  Laterality: N/A;  .  ESOPHAGOGASTRODUODENOSCOPY N/A 10/12/2017   Procedure: ESOPHAGOGASTRODUODENOSCOPY (EGD);  Surgeon: [name], MD;  Location: [facility];  Service: Gastroenterology;  Laterality: N/A;  . ESOPHAGOGASTRODUODENOSCOPY (EGD) WITH ESOPHAGEAL DILATION  last one 11-09-2015  dr Yolanda Bonine   dilation schatzki's ring  . GOLD SEED IMPLANT N/A 04/29/2017   Procedure: GOLD SEED IMPLANT;  Surgeon: [name], MD;  Location: [facility];  Service: Urology;  Laterality: N/A;  . HERNIA REPAIR    . INGUINAL HERNIA REPAIR Right 02/23/2014   Procedure: HERNIA REPAIR INGUINAL ADULT;  Surgeon: Joyice Faster. Cornett, MD;  Location: [facility];  Service: General;  Laterality: Right;  . INSERTION OF MESH Right 02/23/2014   Procedure: INSERTION OF MESH;  Surgeon: Joyice Faster. Cornett, MD;  Location: [facility];  Service: General;  Laterality: Right;  . PERIPHERAL VASCULAR CATHETERIZATION N/A 05/29/2016   Procedure: Lower Extremity Angiography;  Surgeon: [name], MD;  Location: [place] [facility];  Service: Cardiovascular;  Laterality: N/A; [place] orbital rotational atherectomy, PTA and stenting bilateral common iliac arteries   . PILONIDAL CYST / SINUS EXCISION  2000  . PROSTATE BIOPSY  09-22-2016  dr [name] office  . RIGHT/LEFT HEART CATH AND CORONARY ANGIOGRAPHY N/A 01/25/2018   Procedure: RIGHT/LEFT  HEART CATH AND CORONARY ANGIOGRAPHY;  Surgeon: [name], MD;  Location: [place] [facility];  Service: Cardiovascular;  Laterality: N/A;  . SAVORY DILATION N/A 10/12/2017   Procedure: SAVORY DILATION;  Surgeon: [name], MD;  Location: [facility];  Service: Gastroenterology;  Laterality: N/A;  . SPACE OAR INSTILLATION N/A 04/29/2017   Procedure: SPACE OAR INSTILLATION;  Surgeon: [name], MD;  Location: [facility];  Service: Urology;  Laterality: N/A;  . TRANSCATHETER AORTIC VALVE REPLACEMENT, TRANSAPICAL Left 03/16/2018    Procedure: TRANSCATHETER AORTIC VALVE REPLACEMENT, TRANSAPICAL;  Surgeon: Sherren Mocha, MD;  Location: [facility];  Service: Open Heart Surgery;  Laterality: Left;  . TRANSTHORACIC ECHOCARDIOGRAM  05-05-2016   dr berry   ef 59-56%,  grade 1diastolic dysfunction/  moderate AV stenosis with mod. AR (right coronary and noncoronary cusp mobility seversly restricted);  valve area 1.25cm^2, mean gradient 27 mmHg, peak gradient 60mmHg/  mild MR  . UPPER GASTROINTESTINAL ENDOSCOPY      Family History  Problem Relation Age of Onset  . Heart attack Mother   . Stroke Father   . Asthma Father   . Cancer Neg Hx   . Colon cancer Neg Hx   . Stomach cancer Neg Hx   . Esophageal cancer Neg Hx   . Rectal cancer Neg Hx   . Pancreatic cancer Neg Hx   . Prostate cancer Neg Hx     Social History   Socioeconomic History  . Marital status: Married    Spouse name: Not on file  . Number of children: Not on file  . Years of education: Not on file  . Highest education level: Not on file  Occupational History  . Not on file  Social Needs  . Financial resource strain: Not on file  . Food insecurity:    Worry: Not on file    Inability: Not on file  . Transportation needs:    Medical: Not on file    Non-medical: Not on file  Tobacco Use  . Smoking status: Former Smoker    Packs/day: 1.00    Years: 50.00    Pack years: 50.00    Types: Cigarettes    Last attempt to quit: 07/05/2005    Years since quitting: 13.3  . Smokeless tobacco: Never Used  Substance and Sexual Activity  . Alcohol use: No  . Drug use: Never  . Sexual activity: Not Currently  Lifestyle  . Physical activity:    Days per week: Not on file    Minutes per session: Not on file  . Stress: Not on file  Relationships  . Social connections:    Talks on phone: Not on file    Gets together: Not on file    Attends religious service: Not on file    Active member of club or organization: Not on file    Attends meetings of clubs or  organizations: Not on file    Relationship status: Not on file  . Intimate partner violence:    Fear of current or ex partner: Not on file    Emotionally abused: Not on file    Physically abused: Not on file    Forced sexual activity: Not on file  Other Topics Concern  . Not on file  Social History Narrative  . Not on file      Review of systems: Review of Systems as per HPI All other systems reviewed and are negative.   Physical Exam: Vitals were not  taken and physical exam was not performed during this virtual visit.  Data Reviewed:  Reviewed labs, radiology imaging, old records and pertinent past GI work up   Assessment and Plan/Recommendations:  78 year old male with dysphagia, high-grade EG junction outflow obstruction likely secondary to achalasia  Plan for EGD with pneumatic balloon dilation under fluoroscopy or Botox injection if unable to proceed with pneumatic dilation, to be scheduled at Hospital endoscopy unit  Esophagus was filled with food debris on EGD February 2020.  Will need general anesthesia.  Will obtain clearance from cardiology if okay to hold Eliquis for 2 days prior to the procedure.  Continue protein shakes and pured diet as tolerated, small frequent meals  Antireflux measures  The risks and benefits as well as alternatives of endoscopic procedure(s) have been discussed and reviewed. All questions answered. The patient agrees to proceed.     Damaris Hippo , MD   CC: [name], MD

## 2018-11-10 NOTE — Telephone Encounter (Signed)
 [facility] Pre-operative Risk Assessment     Request for surgical clearance:     Endoscopy Procedure  What type of surgery is being performed?     EGD   When is this surgery scheduled?     11/17/2018  What type of clearance is required ?   Pharmacy  Are there any medications that need to be held prior to surgery and how long? 24-48 hours  days  Practice name and name of physician performing surgery?      [facility]  What is your office phone and fax number?      Phone- [phone]  [phone]  Anesthesia type (None, local, MAC, general) ?       MAC

## 2018-11-12 ENCOUNTER — Other Ambulatory Visit: Payer: Self-pay

## 2018-11-12 ENCOUNTER — Other Ambulatory Visit (HOSPITAL_COMMUNITY)
Admission: RE | Admit: 2018-11-12 | Discharge: 2018-11-12 | Disposition: A | Discharge status: Home / Self Care | Payer: Medicare Other | Source: Ambulatory Visit | Attending: Gastroenterology | Admitting: Gastroenterology

## 2018-11-12 DIAGNOSIS — Z1159 Encounter for screening for other viral diseases: Secondary | ICD-10-CM | POA: Diagnosis not present

## 2018-11-13 LAB — NOVEL CORONAVIRUS, NAA (HOSP ORDER, SEND-OUT TO REF LAB; TAT 18-24 HRS): SARS-CoV-2, NAA: NOT DETECTED

## 2018-11-15 ENCOUNTER — Other Ambulatory Visit: Payer: Self-pay

## 2018-11-15 ENCOUNTER — Encounter (HOSPITAL_COMMUNITY): Payer: Self-pay | Admitting: *Deleted

## 2018-11-16 NOTE — Anesthesia Preprocedure Evaluation (Addendum)
Anesthesia Evaluation  Patient identified by MRN, date of birth, ID band Patient awake    Reviewed: Allergy & Precautions, NPO status , Patient's Chart, lab work & pertinent test results  Airway Mallampati: I  TM Distance: >3 FB Neck ROM: Full    Dental no notable dental hx. (+) Upper Dentures, Lower Dentures   Pulmonary sleep apnea , COPD, former smoker,    Pulmonary exam normal breath sounds clear to auscultation       Cardiovascular hypertension, Pt. on medications + Peripheral Vascular Disease  Normal cardiovascular exam Rhythm:Regular Rate:Normal     Neuro/Psych Dementia    GI/Hepatic   Endo/Other  diabetesHypothyroidism   Renal/GU      Musculoskeletal   Abdominal   Peds  Hematology   Anesthesia Other Findings Hx of achaalsia  Reproductive/Obstetrics                            Anesthesia Physical Anesthesia Plan  ASA: III  Anesthesia Plan: General   Post-op Pain Management:    Induction:   PONV Risk Score and Plan: Treatment may vary due to age or medical condition  Airway Management Planned: Nasal Cannula  Additional Equipment:   Intra-op Plan:   Post-operative Plan:   Informed Consent: I have reviewed the patients History and Physical, chart, labs and discussed the procedure including the risks, benefits and alternatives for the proposed anesthesia with the patient or authorized representative who has indicated his/her understanding and acceptance.     Dental advisory given  Plan Discussed with:   Anesthesia Plan Comments: (EGD w Dilatation  w MAC)      Anesthesia Quick Evaluation

## 2018-11-17 ENCOUNTER — Ambulatory Visit (HOSPITAL_COMMUNITY): Payer: Medicare Other

## 2018-11-17 ENCOUNTER — Encounter (HOSPITAL_COMMUNITY)
Admission: RE | Disposition: A | Discharge status: Home / Self Care | Payer: Self-pay | Source: Home / Self Care | Attending: Gastroenterology

## 2018-11-17 ENCOUNTER — Other Ambulatory Visit: Payer: Self-pay

## 2018-11-17 ENCOUNTER — Ambulatory Visit (HOSPITAL_COMMUNITY): Admission: RE | Admit: 2018-11-17 | Payer: Medicare Other | Source: Ambulatory Visit

## 2018-11-17 ENCOUNTER — Encounter (HOSPITAL_COMMUNITY): Payer: Self-pay | Admitting: Gastroenterology

## 2018-11-17 ENCOUNTER — Ambulatory Visit (HOSPITAL_COMMUNITY)
Admission: RE | Admit: 2018-11-17 | Discharge: 2018-11-17 | Disposition: A | Discharge status: Home / Self Care | Payer: Medicare Other | Attending: Gastroenterology | Admitting: Gastroenterology

## 2018-11-17 ENCOUNTER — Telehealth: Payer: Self-pay | Admitting: Gastroenterology

## 2018-11-17 ENCOUNTER — Ambulatory Visit (HOSPITAL_COMMUNITY): Payer: Medicare Other | Admitting: Anesthesiology

## 2018-11-17 DIAGNOSIS — Z79890 Hormone replacement therapy (postmenopausal): Secondary | ICD-10-CM | POA: Insufficient documentation

## 2018-11-17 DIAGNOSIS — E039 Hypothyroidism, unspecified: Secondary | ICD-10-CM | POA: Insufficient documentation

## 2018-11-17 DIAGNOSIS — Z87891 Personal history of nicotine dependence: Secondary | ICD-10-CM | POA: Insufficient documentation

## 2018-11-17 DIAGNOSIS — E1151 Type 2 diabetes mellitus with diabetic peripheral angiopathy without gangrene: Secondary | ICD-10-CM | POA: Diagnosis not present

## 2018-11-17 DIAGNOSIS — R1319 Other dysphagia: Secondary | ICD-10-CM

## 2018-11-17 DIAGNOSIS — K220 Achalasia of cardia: Secondary | ICD-10-CM | POA: Insufficient documentation

## 2018-11-17 DIAGNOSIS — G4733 Obstructive sleep apnea (adult) (pediatric): Secondary | ICD-10-CM | POA: Diagnosis not present

## 2018-11-17 DIAGNOSIS — J449 Chronic obstructive pulmonary disease, unspecified: Secondary | ICD-10-CM | POA: Insufficient documentation

## 2018-11-17 DIAGNOSIS — Z952 Presence of prosthetic heart valve: Secondary | ICD-10-CM | POA: Insufficient documentation

## 2018-11-17 DIAGNOSIS — Z8546 Personal history of malignant neoplasm of prostate: Secondary | ICD-10-CM | POA: Insufficient documentation

## 2018-11-17 DIAGNOSIS — I10 Essential (primary) hypertension: Secondary | ICD-10-CM | POA: Diagnosis not present

## 2018-11-17 DIAGNOSIS — K2970 Gastritis, unspecified, without bleeding: Secondary | ICD-10-CM | POA: Diagnosis not present

## 2018-11-17 DIAGNOSIS — K222 Esophageal obstruction: Secondary | ICD-10-CM | POA: Diagnosis not present

## 2018-11-17 DIAGNOSIS — Z7901 Long term (current) use of anticoagulants: Secondary | ICD-10-CM | POA: Insufficient documentation

## 2018-11-17 DIAGNOSIS — Z7951 Long term (current) use of inhaled steroids: Secondary | ICD-10-CM | POA: Insufficient documentation

## 2018-11-17 DIAGNOSIS — K228 Other specified diseases of esophagus: Secondary | ICD-10-CM | POA: Insufficient documentation

## 2018-11-17 DIAGNOSIS — T18128A Food in esophagus causing other injury, initial encounter: Secondary | ICD-10-CM | POA: Insufficient documentation

## 2018-11-17 DIAGNOSIS — K219 Gastro-esophageal reflux disease without esophagitis: Secondary | ICD-10-CM | POA: Insufficient documentation

## 2018-11-17 DIAGNOSIS — R634 Abnormal weight loss: Secondary | ICD-10-CM | POA: Diagnosis not present

## 2018-11-17 DIAGNOSIS — I6523 Occlusion and stenosis of bilateral carotid arteries: Secondary | ICD-10-CM | POA: Diagnosis not present

## 2018-11-17 DIAGNOSIS — Z7982 Long term (current) use of aspirin: Secondary | ICD-10-CM | POA: Diagnosis not present

## 2018-11-17 DIAGNOSIS — Z95820 Peripheral vascular angioplasty status with implants and grafts: Secondary | ICD-10-CM | POA: Insufficient documentation

## 2018-11-17 DIAGNOSIS — F0390 Unspecified dementia without behavioral disturbance: Secondary | ICD-10-CM | POA: Diagnosis not present

## 2018-11-17 DIAGNOSIS — Q396 Congenital diverticulum of esophagus: Secondary | ICD-10-CM

## 2018-11-17 DIAGNOSIS — K225 Diverticulum of esophagus, acquired: Secondary | ICD-10-CM

## 2018-11-17 DIAGNOSIS — E43 Unspecified severe protein-calorie malnutrition: Secondary | ICD-10-CM

## 2018-11-17 DIAGNOSIS — I350 Nonrheumatic aortic (valve) stenosis: Secondary | ICD-10-CM | POA: Insufficient documentation

## 2018-11-17 DIAGNOSIS — R1310 Dysphagia, unspecified: Secondary | ICD-10-CM | POA: Diagnosis not present

## 2018-11-17 DIAGNOSIS — I739 Peripheral vascular disease, unspecified: Secondary | ICD-10-CM | POA: Diagnosis not present

## 2018-11-17 HISTORY — DX: Type 2 diabetes mellitus without complications: E11.9

## 2018-11-17 HISTORY — PX: BALLOON DILATION: SHX5330

## 2018-11-17 HISTORY — PX: ESOPHAGOGASTRODUODENOSCOPY (EGD) WITH PROPOFOL: SHX5813

## 2018-11-17 HISTORY — PX: FOREIGN BODY REMOVAL: SHX962

## 2018-11-17 HISTORY — PX: SAVORY DILATION: SHX5439

## 2018-11-17 SURGERY — ESOPHAGOGASTRODUODENOSCOPY (EGD) WITH PROPOFOL
Anesthesia: Monitor Anesthesia Care

## 2018-11-17 MED ORDER — SUCCINYLCHOLINE CHLORIDE 200 MG/10ML IV SOSY
PREFILLED_SYRINGE | INTRAVENOUS | Status: DC | PRN
  Administered 2018-11-17: 60 mg via INTRAVENOUS

## 2018-11-17 MED ORDER — EPHEDRINE SULFATE-NACL 50-0.9 MG/10ML-% IV SOSY
PREFILLED_SYRINGE | INTRAVENOUS | Status: DC | PRN
  Administered 2018-11-17: 10 mg via INTRAVENOUS

## 2018-11-17 MED ORDER — LACTATED RINGERS IV SOLN
INTRAVENOUS | Status: DC
  Administered 2018-11-17: 10:00:00 via INTRAVENOUS

## 2018-11-17 MED ORDER — PROPOFOL 10 MG/ML IV BOLUS
INTRAVENOUS | Status: AC
  Filled 2018-11-17: qty 40

## 2018-11-17 MED ORDER — LIDOCAINE 2% (20 MG/ML) 5 ML SYRINGE
INTRAMUSCULAR | Status: DC | PRN
  Administered 2018-11-17: 1 mg via INTRAVENOUS
  Administered 2018-11-17: 100 mg via INTRAVENOUS

## 2018-11-17 MED ORDER — SODIUM CHLORIDE 0.9 % IV SOLN: INTRAVENOUS | Status: DC

## 2018-11-17 MED ORDER — PROPOFOL 10 MG/ML IV BOLUS
INTRAVENOUS | Status: DC | PRN
  Administered 2018-11-17: 100 mg via INTRAVENOUS

## 2018-11-17 MED ORDER — PHENYLEPHRINE 40 MCG/ML (10ML) SYRINGE FOR IV PUSH (FOR BLOOD PRESSURE SUPPORT)
PREFILLED_SYRINGE | INTRAVENOUS | Status: DC | PRN
  Administered 2018-11-17: 40 ug via INTRAVENOUS
  Administered 2018-11-17: 80 ug via INTRAVENOUS
  Administered 2018-11-17: 40 ug via INTRAVENOUS

## 2018-11-17 SURGICAL SUPPLY — 15 items

## 2018-11-17 NOTE — Anesthesia Procedure Notes (Signed)
Procedure Name: Intubation Date/Time: 11/17/2018 10:54 AM Performed by: Maxwell Caul, CRNA Pre-anesthesia Checklist: Patient identified, Emergency Drugs available, Suction available and Patient being monitored Patient Re-evaluated:Patient Re-evaluated prior to induction Oxygen Delivery Method: Circle system utilized Preoxygenation: Pre-oxygenation with 100% oxygen Induction Type: IV induction, Rapid sequence and Cricoid Pressure applied Laryngoscope Size: Mac and 4 Grade View: Grade I Tube type: Oral Tube size: 7.5 mm Number of attempts: 1 Airway Equipment and Method: Stylet Placement Confirmation: ETT inserted through vocal cords under direct vision,  positive ETCO2 and breath sounds checked- equal and bilateral Secured at: 21 cm Tube secured with: Tape Dental Injury: Teeth and Oropharynx as per pre-operative assessment

## 2018-11-17 NOTE — Interval H&P Note (Signed)
History and Physical Interval Note:  11/17/2018 9:11 AM  Robert Schaefer  has presented today for surgery, with the diagnosis of Acalashia.  The various methods of treatment have been discussed with the patient and family. After consideration of risks, benefits and other options for treatment, the patient has consented to  Procedure(s) with comments: ESOPHAGOGASTRODUODENOSCOPY (N/A) RIGIFLEX DILATION (N/A) - GASTROGRAFIN 2HR POST PROCEDURE as a surgical intervention.  The patient's history has been reviewed, patient examined, no change in status, stable for surgery.  I have reviewed the patient's chart and labs.  Questions were answered to the patient's satisfaction.     Irving Bloor

## 2018-11-17 NOTE — Transfer of Care (Signed)
Immediate Anesthesia Transfer of Care Note  Patient: Robert Schaefer  Procedure(s) Performed: ESOPHAGOGASTRODUODENOSCOPY (N/A ) SAVORY DILATION (N/A ) BALLOON DILATION (N/A )  Patient Location: PACU  Anesthesia Type:General  Level of Consciousness: awake, alert  and oriented  Airway & Oxygen Therapy: Patient Spontanous Breathing and Patient connected to face mask oxygen  Post-op Assessment: Report given to RN and Post -op Vital signs reviewed and stable  Post vital signs: Reviewed and stable  Last Vitals:  Vitals Value Taken Time  BP 161/56 11/17/2018 12:18 PM  Temp 36.4 C 11/17/2018 12:18 PM  Pulse 62 11/17/2018 12:18 PM  Resp 23 11/17/2018 12:18 PM  SpO2 100 % 11/17/2018 12:18 PM  Vitals shown include unvalidated device data.  Last Pain:  Vitals:   11/17/18 1218  TempSrc: Oral  PainSc:          Complications: No apparent anesthesia complications

## 2018-11-17 NOTE — Progress Notes (Signed)
Pt oxygen saturation in upper 80s. Pt encouraged to deep breathe and cough. Oxygen sats would increase to low 90s after deep breathing and coughing then drop back to upper 80s. Pt placed on 2L of oxygen. Dr. Valma Cava notified and he stated that he will come see the patient.

## 2018-11-17 NOTE — Anesthesia Postprocedure Evaluation (Signed)
Anesthesia Post Note  Patient: Robert Schaefer  Procedure(s) Performed: ESOPHAGOGASTRODUODENOSCOPY (N/A ) SAVORY DILATION (N/A ) BALLOON DILATION (N/A )     Patient location during evaluation: Endoscopy Anesthesia Type: MAC Level of consciousness: awake and alert Pain management: pain level controlled Vital Signs Assessment: post-procedure vital signs reviewed and stable Respiratory status: spontaneous breathing, nonlabored ventilation, respiratory function stable and patient connected to nasal cannula oxygen Cardiovascular status: blood pressure returned to baseline and stable Postop Assessment: no apparent nausea or vomiting Anesthetic complications: no    Last Vitals:  Vitals:   11/17/18 1312 11/17/18 1315  BP: (!) 127/41   Pulse: 63 65  Resp: (!) 27 11  Temp:    SpO2: (!) 89% 95%    Last Pain:  Vitals:   11/17/18 1218  TempSrc: Oral  PainSc: 0-No pain                 Barnet Glasgow

## 2018-11-17 NOTE — Discharge Instructions (Signed)
YOU HAD AN ENDOSCOPIC PROCEDURE TODAY: Refer to the procedure report and other information in the discharge instructions given to you for any specific questions about what was found during the examination. If this information does not answer your questions, please call [place] office at [phone] to clarify.   YOU SHOULD EXPECT: Some feelings of bloating in the abdomen. Passage of more gas than usual. Walking can help get rid of the air that was put into your GI tract during the procedure and reduce the bloating. If you had a lower endoscopy (such as a colonoscopy or flexible sigmoidoscopy) you may notice spotting of blood in your stool or on the toilet paper. Some abdominal soreness may be present for a day or two, also.  DIET: Your first meal following the procedure should be a light meal and then it is ok to progress to your normal diet. A half-sandwich or bowl of soup is an example of a good first meal. Heavy or fried foods are harder to digest and may make you feel nauseous or bloated. Drink plenty of fluids but you should avoid alcoholic beverages for 24 hours. If you had a esophageal dilation, please see attached instructions for diet.    ACTIVITY: Your care partner should take you home directly after the procedure. You should plan to take it easy, moving slowly for the rest of the day. You can resume normal activity the day after the procedure however YOU SHOULD NOT DRIVE, use power tools, machinery or perform tasks that involve climbing or major physical exertion for 24 hours (because of the sedation medicines used during the test).   SYMPTOMS TO REPORT IMMEDIATELY: A gastroenterologist can be reached at any hour. Please call [phone]  for any of the following symptoms:   Following upper endoscopy (EGD, EUS, ERCP, esophageal dilation) Vomiting of blood or coffee ground material  New, significant abdominal pain  New, significant chest pain or pain under the shoulder blades  Painful or  persistently difficult swallowing  New shortness of breath  Black, tarry-looking or red, bloody stools  FOLLOW UP:  If any biopsies were taken you will be contacted by phone or by letter within the next 1-3 weeks. Call [phone]  if you have not heard about the biopsies in 3 weeks.  Please also call with any specific questions about appointments or follow up tests.  

## 2018-11-17 NOTE — Op Note (Addendum)
[facility] Patient Name: Robert Schaefer Procedure Date: 11/17/2018 MRN: [id number] Attending MD: [name] , MD Date of Birth: 25-May-1941 CSN: [id number] Age: 78 Admit Type: Outpatient Procedure:                Upper GI endoscopy Indications:              Dysphagia Providers:                [name], MD, Cleda Daub, RN, Tinnie Gens, Technician, Virgia Land, CRNA Referring MD:              Medicines:                Monitored Anesthesia Care Complications:            No immediate complications. Estimated Blood Loss:     Estimated blood loss was minimal. Procedure:                Pre-Anesthesia Assessment:                           - Prior to the procedure, a History and Physical                            was performed, and patient medications and                            allergies were reviewed. The patient's tolerance of                            previous anesthesia was also reviewed. The risks                            and benefits of the procedure and the sedation                            options and risks were discussed with the patient.                            All questions were answered, and informed consent                            was obtained. Prior Anticoagulants: The patient                            last took Eliquis (apixaban) 2 days prior to the                            procedure. ASA Grade Assessment: IV - A patient                            with severe systemic disease that is a constant  threat to life. After reviewing the risks and                            benefits, the patient was deemed in satisfactory                            condition to undergo the procedure.                           After obtaining informed consent, the endoscope was                            passed under direct vision. Throughout the                            procedure, the  patient's blood pressure, pulse, and                            oxygen saturations were monitored continuously. The                            GIF-H190 ([id number]) Olympus gastroscope was                            introduced through the mouth, and advanced to the                            second part of duodenum. The upper GI endoscopy was                            technically difficult and complex due to stricture,                            abnormal anatomy with large esophageal diverticula                            and esophagus filled with fluid and residual food. Scope In: Scope Out: Findings:      The lumen of the esophagus was moderately dilated.      Food was found in the entire esophagus. Removal of food was accomplished.      A non-bleeding diverticulum with a large opening was found in the lower       third of the esophagus.      One benign-appearing, intrinsic severe (stenosis; an endoscope cannot       pass) stenosis was found 38 to 40 cm from the incisors. This stenosis       measured 4 mm (inner diameter) x 1 cm (in length). The stenosis was       traversed after downsizing scope to pediatric scope and dilating. A       guidewire was placed under fluoroscopic guidance and the scope was       withdrawn. Dilation was performed with a Savary dilator with no       resistance at 5 mm, 6 mm, 7 mm and 8 mm, mild resistance at 9 mm and 10  mm and moderate resistance at 11 mm and 12 mm. The dilation site was       examined following endoscope reinsertion and showed mild mucosal       disruption. Switched back to adult endoscope.      A TTS dilator was passed through the scope. Dilation with a 12-13.5-15       mm x 8 cm and 18-19-20 mm x 8 cm CRE balloon dilator was performed to 18       mm with mild mucosal disruption and superficial mucosal tear      Patchy mild inflammation characterized by congestion (edema) and       erythema was found in the entire examined stomach.       The examined duodenum was normal. Impression:               - Dilation in the entire esophagus.                           - Food in the esophagus. Removal was successful.                           - Diverticulum in the lower third of the esophagus.                           - Benign-appearing high grade esophageal stenosis.                            Dilated with savary after passing guidewire under                            fluoro to max 15mm followed by TTS balloon to 63mm.                           - Gastritis.                           - Normal examined duodenum. Moderate Sedation:      N/A Recommendation:           - Full liquid diet today, then advance as tolerated                            to mechanical soft diet indefinitely.                           - Continue present medications.                           - Lansoprazole solutab 30mg  twice daily                           - Antireflux measures                           - Resume Eliquis (apixaban) at prior dose in 3                            days. Refer to  managing physician for further                            adjustment of therapy.                           - Repeat upper endoscopy in 3 months for                            retreatment.                           - Return to GI clinic tele visit in 2 weeks. Procedure Code(s):        --- Professional ---                           [phone], 59, Esophagogastroduodenoscopy, flexible,                            transoral; with removal of foreign body(s)                           43248, 22, Esophagogastroduodenoscopy, flexible,                            transoral; with insertion of guide wire followed by                            passage of dilator(s) through esophagus over guide                            wire                           43249, Esophagogastroduodenoscopy, flexible,                            transoral; with transendoscopic balloon dilation of                             esophagus (less than 30 mm diameter)                           74360, Intraluminal dilation of strictures and/or                            obstructions (eg, esophagus), radiological                            supervision and interpretation Diagnosis Code(s):        --- Professional ---                           K22.8, Other specified diseases of esophagus                           T18.128A, Food in esophagus causing other injury,  initial encounter                           Q39.6, Congenital diverticulum of esophagus                           K22.2, Esophageal obstruction                           K29.70, Gastritis, unspecified, without bleeding                           R13.10, Dysphagia, unspecified CPT copyright 2019 [employer]. All rights reserved. The codes documented in this report are preliminary and upon coder review may  be revised to meet current compliance requirements. [name], MD 11/17/2018 12:33:38 PM This report has been signed electronically. Number of Addenda: 0

## 2018-11-17 NOTE — Telephone Encounter (Signed)
Daughter Elane Fritz called for update on her father's endoscopy results. We went over them together as she is on the patient's contact list. She asked about the use of Botox and I described that it was not mentioned in the chart. She was thankful for the call back and looks forward to the return telehealth visit in the next 2 to 3 weeks as noted in the endoscopy report. This is for documentation purposes and will be sent to Dr. Silverio Decamp for her information. She was appreciative for the call back and the care that her father has received at [facility].  Justice Britain, MD [facility] Office # [phone]

## 2018-11-18 NOTE — Telephone Encounter (Signed)
Thank you, called her yesterday evening and discussed the plan and follow up.

## 2018-11-18 NOTE — Anesthesia Postprocedure Evaluation (Signed)
Anesthesia Post Note  Patient: Robert Schaefer  Procedure(s) Performed: ESOPHAGOGASTRODUODENOSCOPY (N/A ) SAVORY DILATION (N/A ) BALLOON DILATION (N/A )     Patient location during evaluation: Endoscopy Anesthesia Type: MAC and General Level of consciousness: awake and alert Pain management: pain level controlled Vital Signs Assessment: post-procedure vital signs reviewed and stable Respiratory status: spontaneous breathing, nonlabored ventilation, respiratory function stable and patient connected to nasal cannula oxygen Cardiovascular status: blood pressure returned to baseline and stable Postop Assessment: no apparent nausea or vomiting Anesthetic complications: no    Last Vitals:  Vitals:   11/17/18 1328 11/17/18 1335  BP:    Pulse: (!) 58   Resp: (!) 21   Temp:    SpO2: 96% 93%    Last Pain:  Vitals:   11/18/18 1101  TempSrc:   PainSc: 0-No pain   Pain Goal:                   Barnet Glasgow

## 2018-11-18 NOTE — Addendum Note (Signed)
Addendum  created 11/18/18 1316 by Barnet Glasgow, MD   Clinical Note Signed

## 2018-11-19 ENCOUNTER — Encounter (HOSPITAL_COMMUNITY): Payer: Self-pay | Admitting: Gastroenterology

## 2018-11-26 DIAGNOSIS — C61 Malignant neoplasm of prostate: Secondary | ICD-10-CM | POA: Diagnosis not present

## 2018-12-02 IMAGING — CT NM PET TUM IMG INITIAL (PI) SKULL BASE T - THIGH
8 series · 25 of 25 positions shown · non-contrast
Comparison: CT chest 06/18/2016 and 03/27/2016.

CLINICAL DATA: Initial treatment strategy for cavitary mass in left
upper.

EXAM:
NUCLEAR MEDICINE PET SKULL BASE TO THIGH
TECHNIQUE: 8.7 mCi F-18 FDG was injected intravenously. Full-ring PET imaging
was performed from the skull base to thigh after the radiotracer. CT
data was obtained and used for attenuation correction and anatomic
localization.
FASTING BLOOD GLUCOSE:  Value: 97 mg/dl

[Series 3: pet sk_thigh ac · axial · 5.0mm · 4.07mm/px · z∈[-744,+100]mm · 4 of 212 slices shown]
[im 1/212]
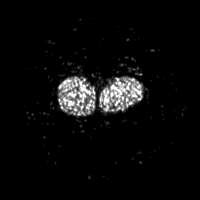
[im 71/212]
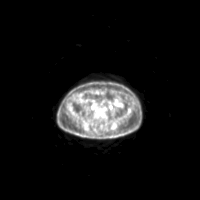
[im 141/212]
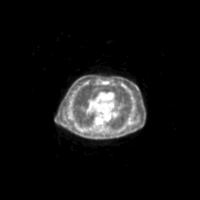
[im 212/212]
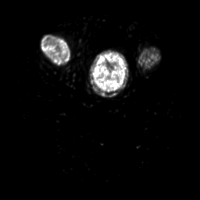

[Series 4: ct sk_thigh 5.0 b31f · axial · 5.0mm · 0.98mm/px · z∈[-744,+100]mm · 5 of 212 slices shown]
[im 1/212]
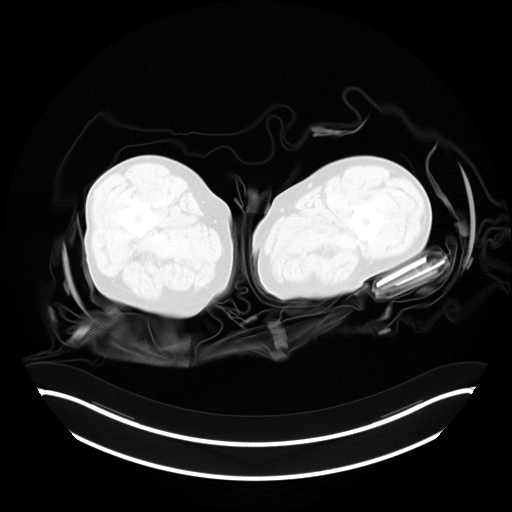
[im 53/212]
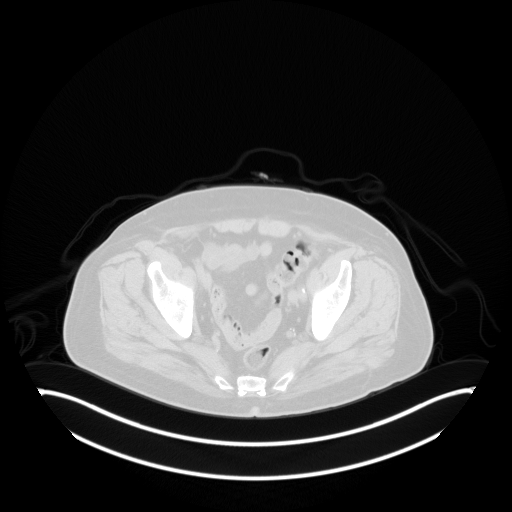
[im 106/212]
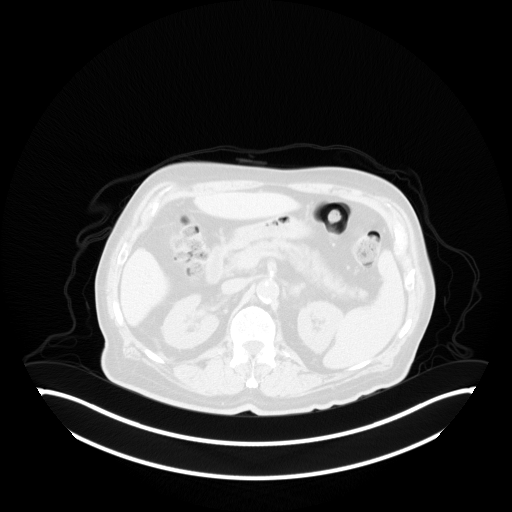
[im 159/212]
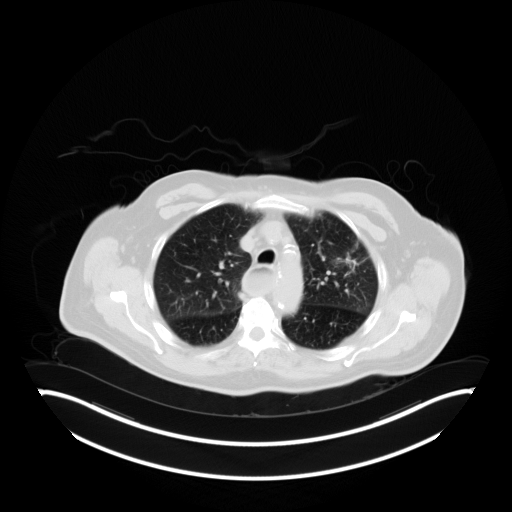
[im 212/212  brain]
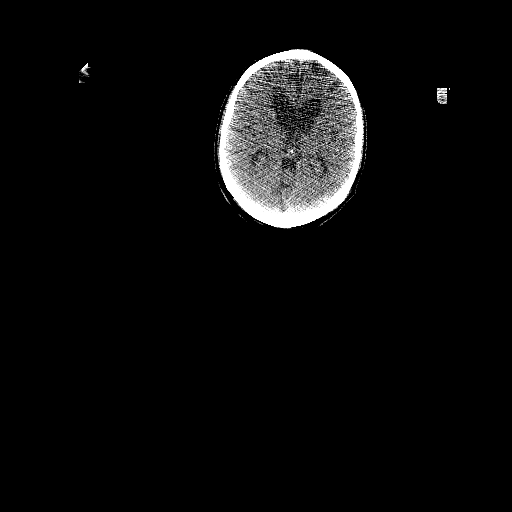

[Series 7: ct sk_thigh 5.0 b70f (id)_bone · axial · 5.0mm · 0.62mm/px · z∈[-274,-18]mm · 2 of 65 slices shown]
[im 1/65  bone]
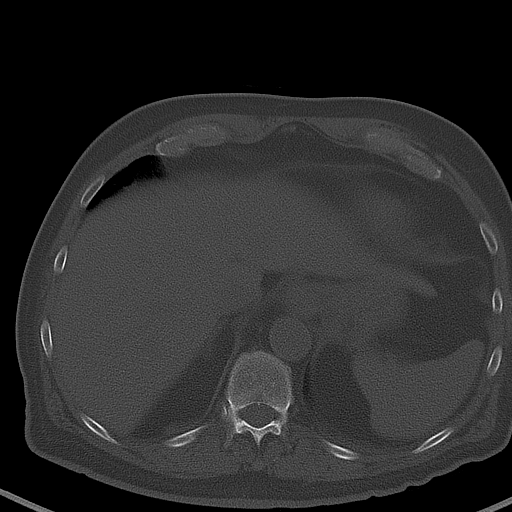
[im 65/65  bone]
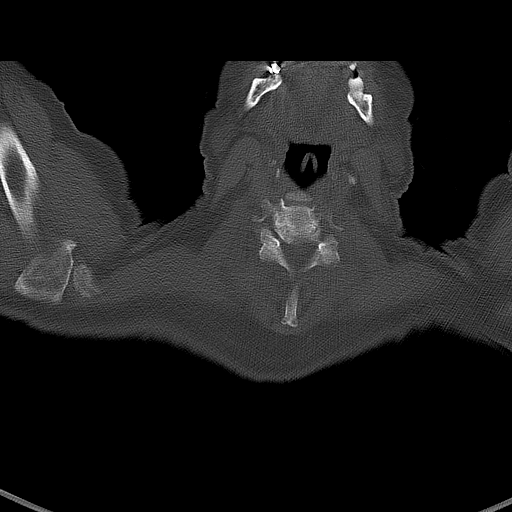

[Series 8: pet sk_thigh nac · axial · 5.0mm · 4.07mm/px · z∈[-744,+100]mm · 5 of 212 slices shown]
[im 1/212]
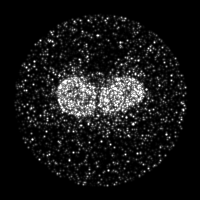
[im 53/212]
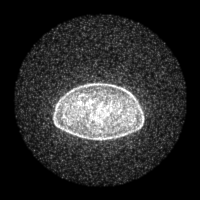
[im 106/212]
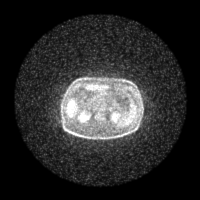
[im 159/212]
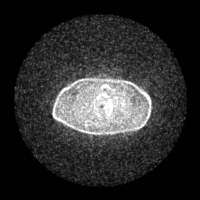
[im 212/212]
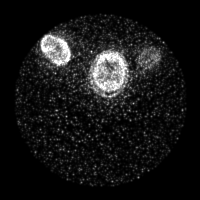

[Series 604: range-ct sk_thigh 5.0 (id)<alpha range> · 2 of 65 slices shown (1 of 2)]
[im 1/65]
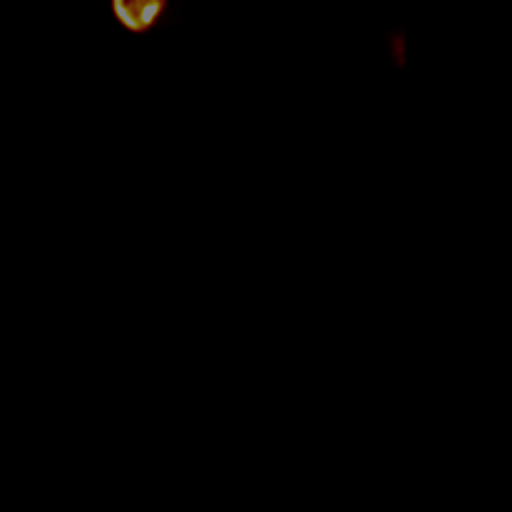
[im 65/65]
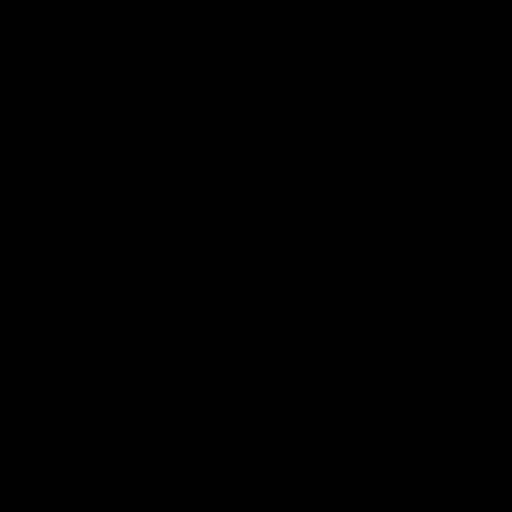

[Series 605: mip collection · coronal · 1.75mm/px · 1 of 32 slices shown]
[im 1/32]
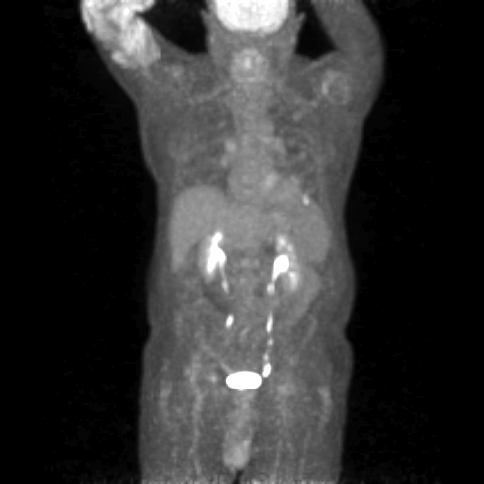

[Series 606: range-ct sk_thigh 5.0 (id)<alpha range> · 5 of 197 slices shown (2 of 2)]
[im 1/197]
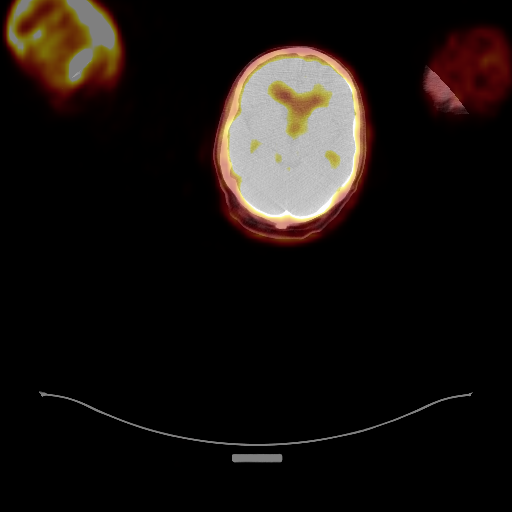
[im 50/197]
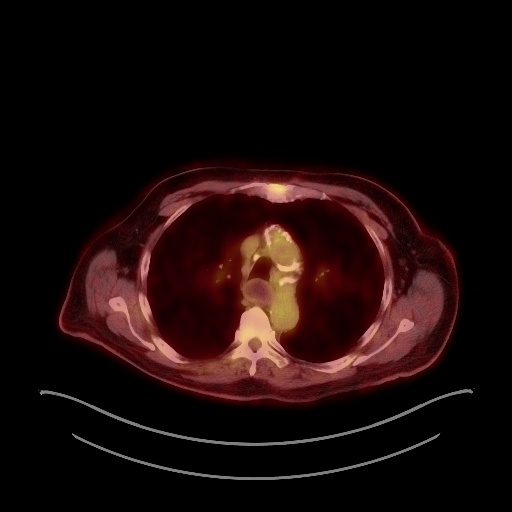
[im 99/197]
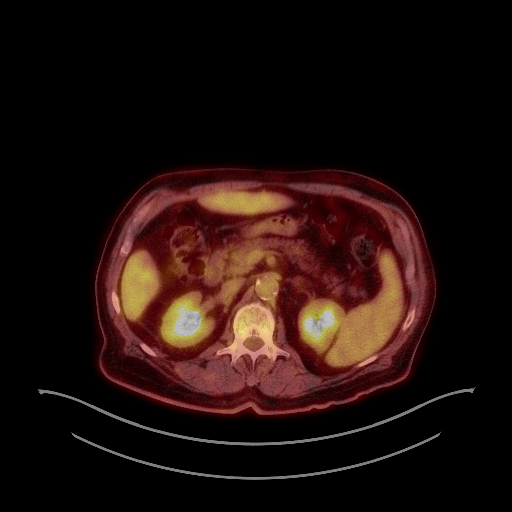
[im 148/197]
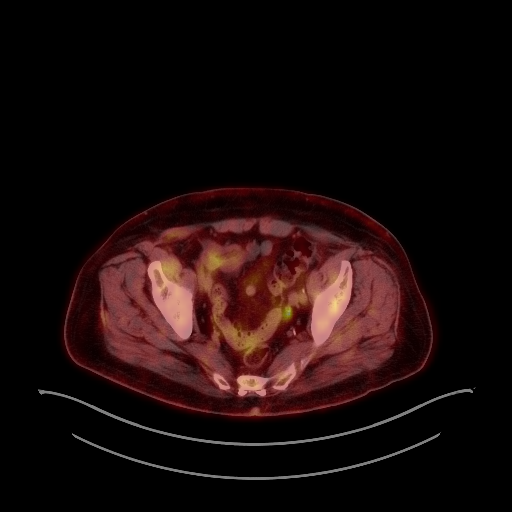
[im 197/197]
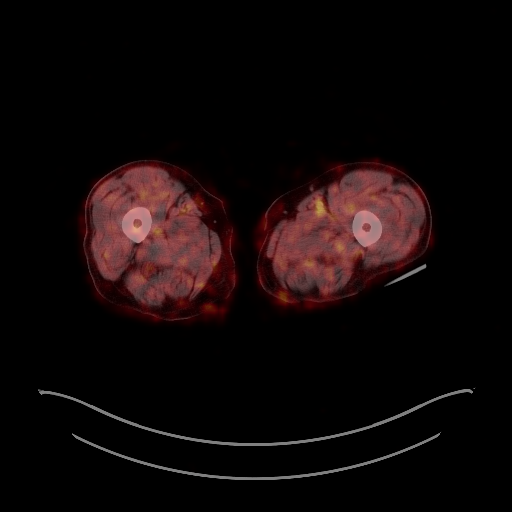

[Series 1047: results mm oncology reading · 1.00mm/px · 1 of 4 slices shown]
[im 1/4]
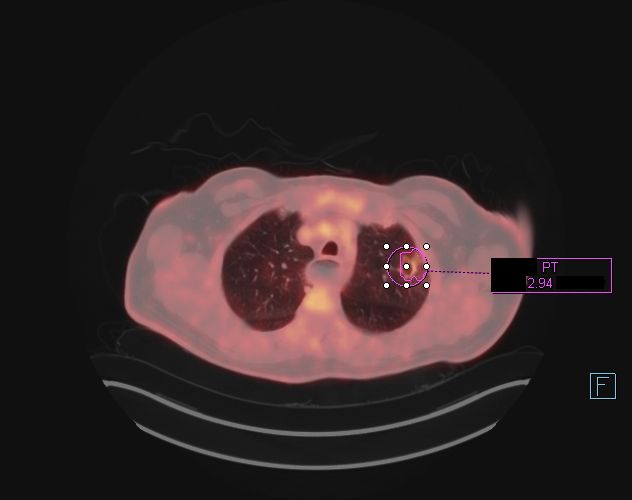

[25 of 25 positions shown; findings below may reference images not displayed]

FINDINGS: NECK

No hypermetabolic lymph nodes in the neck. CT images show no acute
findings.

CHEST

No hypermetabolic mediastinal, hilar or axillary lymph nodes. A
mildly thick-walled cavitary lesion in the left upper lobe measures
roughly 1.9 x 2.3 cm with an SUV max of 2.9. Surrounding
consolidation seen on 06/18/2016 has improved somewhat in the
interval. No additional areas of abnormal hypermetabolism in the
lungs. Esophagus is rather dilated and contains fluid and food
debris. There is a large lower esophageal diverticulum.
Atherosclerotic calcification of the aorta, aortic valve and
coronary arteries. Calcified mediastinal and hilar lymph nodes. No
pericardial effusion. Heart is at the upper limits of normal in
size. Scattered calcified granulomas in the lungs.

ABDOMEN/PELVIS

No abnormal hypermetabolism in the liver, adrenal glands, spleen or
pancreas. No hypermetabolic lymph nodes. Liver, gallbladder, adrenal
glands, kidneys, spleen, pancreas, stomach and bowel are grossly
unremarkable. Atherosclerotic calcification of the arterial
vasculature. Infrarenal aorta measures 3.2 cm.

SKELETON

Hypermetabolism is seen in the anterior aspects of the left third
through sixth ribs.
IMPRESSION: 1. Mildly hypermetabolic cavitary left upper lobe lesion may be due
to a chronic low grade infectious process, as the CT appearance
continues to improve. Underlying malignancy cannot be definitively
excluded.
2. Hypermetabolism in the anterior left third through sixth ribs is
likely posttraumatic in etiology.
3. Esophageal dilatation noted dysmotility. Large lower esophageal
diverticulum.
4. Infrarenal aortic aneurysm. Recommend followup by ultrasound in 3
years. This recommendation follows ACR consensus guidelines: White
Paper of the ACR Incidental Findings Committee II on Vascular
Findings. [HOSPITAL] 0937; [DATE].
5. Aortic atherosclerosis (L28KD-170.0). Coronary and artery
calcification.

## 2018-12-03 DIAGNOSIS — C61 Malignant neoplasm of prostate: Secondary | ICD-10-CM | POA: Diagnosis not present

## 2018-12-03 DIAGNOSIS — R351 Nocturia: Secondary | ICD-10-CM | POA: Diagnosis not present

## 2018-12-03 DIAGNOSIS — Z5111 Encounter for antineoplastic chemotherapy: Secondary | ICD-10-CM | POA: Diagnosis not present

## 2018-12-09 ENCOUNTER — Encounter: Payer: Self-pay | Admitting: Internal Medicine

## 2018-12-09 ENCOUNTER — Other Ambulatory Visit: Payer: Self-pay

## 2018-12-09 ENCOUNTER — Ambulatory Visit (INDEPENDENT_AMBULATORY_CARE_PROVIDER_SITE_OTHER): Payer: Medicare Other | Admitting: Internal Medicine

## 2018-12-09 VITALS — BP 116/62 | HR 60 | Temp 97.2°F | Resp 16 | Ht 65.0 in | Wt 121.4 lb

## 2018-12-09 DIAGNOSIS — I480 Paroxysmal atrial fibrillation: Secondary | ICD-10-CM | POA: Diagnosis not present

## 2018-12-09 DIAGNOSIS — R7309 Other abnormal glucose: Secondary | ICD-10-CM | POA: Diagnosis not present

## 2018-12-09 DIAGNOSIS — E782 Mixed hyperlipidemia: Secondary | ICD-10-CM | POA: Diagnosis not present

## 2018-12-09 DIAGNOSIS — E559 Vitamin D deficiency, unspecified: Secondary | ICD-10-CM | POA: Diagnosis not present

## 2018-12-09 DIAGNOSIS — I10 Essential (primary) hypertension: Secondary | ICD-10-CM

## 2018-12-09 DIAGNOSIS — E039 Hypothyroidism, unspecified: Secondary | ICD-10-CM | POA: Diagnosis not present

## 2018-12-09 DIAGNOSIS — Z79899 Other long term (current) drug therapy: Secondary | ICD-10-CM | POA: Diagnosis not present

## 2018-12-09 DIAGNOSIS — R7303 Prediabetes: Secondary | ICD-10-CM

## 2018-12-09 NOTE — Progress Notes (Signed)
History of Present Illness:         This very nice 78 y.o. WWM presents for 3 month follow up with Hypertension, Hyperlipidemia, Pre-Diabetes, GERD, Hypothyroidism, hx/o Prostate Ca, OSA/on CPAP and Vitamin D Deficiency. Patient is also being followed by Dr [name] for a LUL cavitary lesion slowly decreasing in size &  speculated infectious due to aspiration.       Patient is treated for HTN (1995)  & BP has been controlled at home. Today's BP is at goal - 116/62.  Patient has ASPVD and is post bilat Aortoiliac arthrotomy in Nov 2017 (Dr [name]) and patient also has a  L Renal Aa stenosis and Mesenteric Aa stenosis . Patient has had no complaints of any cardiac type chest pain, palpitations, dyspnea / orthopnea / PND, dizziness, claudication, or dependent edema.      Hyperlipidemia is controlled with diet & meds. Patient denies myalgias or other med SE's. Last Lipids were at goal: Lab Results  Component Value Date   CHOL 150 12/09/2018   HDL 54 12/09/2018   LDLCALC 75 12/09/2018   TRIG 119 12/09/2018   CHOLHDL 2.8 12/09/2018       Also, the patient has history of PreDiabetes (A1c 6.1% / 2012 & 5.6% / 2016)  and has had no symptoms of reactive hypoglycemia, diabetic polys, paresthesias or visual blurring.  Last A1c was Normal & at goal: Lab Results  Component Value Date   HGBA1C 4.8 12/09/2018      Also, patient has been on Thyroid replacement since 2014.      Further, the patient also has history of Vitamin D Deficiency and supplements vitamin D without any suspected side-effects. Last vitamin D was at goal: Lab Results  Component Value Date   VD25OH 63 12/09/2018   Current Outpatient Medications on File Prior to Visit  Medication Sig  . albuterol (PROVENTIL HFA;VENTOLIN HFA) 108 (90 Base) MCG/ACT inhaler Inhale 1 puff into the lungs every 6 (six) hours as needed for wheezing or shortness of breath.  . alfuzosin (UROXATRAL) 10 MG 24 hr tablet Take 10 mg by mouth daily.   [name] apixaban  (ELIQUIS) 2.5 MG TABS tablet Take 1 tablet (2.5 mg total) by mouth 2 (two) times daily.  [name] aspirin EC 81 MG tablet Take 81 mg by mouth daily.  . Cholecalciferol (VITAMIN D-3) 5000 units TABS Take 5,000 Units by mouth daily at 3 pm.  . feeding supplement, ENSURE ENLIVE, (ENSURE ENLIVE) LIQD Take 237 mLs by mouth 5 (five) times daily.   . fluticasone (FLONASE) 50 MCG/ACT nasal spray Place 2 sprays into both nostrils daily as needed for allergies or rhinitis.   [name] levothyroxine (SYNTHROID, LEVOTHROID) 50 MCG tablet TAKE 1 TABLET BY MOUTH ONCE DAILY (Patient taking differently: Take 50 mcg by mouth daily before breakfast. )  . omeprazole (PRILOSEC) 40 MG capsule Take 1 capsule (40 mg total) by mouth 2 (two) times daily. TAKE 1 CAPSULE BY MOUTH TWICE A DAY (Patient taking differently: Take 40 mg by mouth 2 (two) times daily. )   No current facility-administered medications on file prior to visit.    Allergies  Allergen Reactions  . Penicillins Anaphylaxis, Itching, Swelling and Other (See Comments)  . Sulfa Antibiotics Anaphylaxis and Swelling  . Versed [Midazolam] Other (See Comments)    Confusion, delerium   PMHx:   Past Medical History:  Diagnosis Date  . AAA (abdominal aortic aneurysm) ([place])    no longer monitoring was small area  .  Achalasia   . Carotid stenosis, asymptomatic, bilateral    per duplex --- bilateral <50% ICA  . COPD (chronic obstructive pulmonary disease) ([place])    pulmologist-  dr [name]  . Diabetes mellitus without complication (HCC)    diet controlled  . GERD (gastroesophageal reflux disease)   . Hyperlipidemia   . Hypertension   . Hypothyroidism   . Lesion of left lung    followed by dr [name] (pulmologist)  post infection scarring  . Mild dementia ([place])   . OSA (obstructive sleep apnea)    has a cpap-  intolerant; due to Claustaphobia (01/25/2018)  . Peripheral vascular disease [facility])    cardiologist-- dr berry-- per last ABI/Aorta/Iliac 06-09-2016  patent bilateral  iliac kissing stents, >50% stenosis bilateral iliac artery (R>L) and left external iliac artery, patent IVC  . Prostate cancer [facility])    urologist-  dr [name] oncologist-  dr [name]. Dx'd 2018 s/p XRT  . RLS (restless legs syndrome)   . S/P TAVR (transcatheter aortic valve replacement) 02/2018   s/p TAVR via transapical approach with an Edwards Sapien 3 THV (size 26 mm, model # 9600TFX, serial # [id number])  . Schatzki's ring   . Severe aortic stenosis    Immunization History  Administered Date(s) Administered  . DT 09/22/2013  . Influenza, High Dose Seasonal PF 04/11/2014, 04/20/2015  . Zoster 06/30/2005   Past Surgical History:  Procedure Laterality Date  . ABDOMINAL AORTOGRAM N/A 01/25/2018   Procedure: ABDOMINAL AORTOGRAM;  Surgeon: [name], MD;  Location: [facility] [facility];  Service: Cardiovascular;  Laterality: N/A;  . ANAL FISTULECTOMY  2010  . BALLOON DILATION N/A 11/17/2018   Procedure: BALLOON DILATION;  Surgeon: [name], MD;  Location: [facility];  Service: Endoscopy;  Laterality: N/A;  . CARDIAC CATHETERIZATION Bilateral 01/25/2018  . CARDIOVASCULAR STRESS TEST  04-23-2016   dr berry   normal nuclear study w/ no ischemia/  normal LV function and wall function, nuclear stress ef 57%  . CATARACT EXTRACTION W/ INTRAOCULAR LENS  IMPLANT, BILATERAL Bilateral   . COLONOSCOPY    . ESOPHAGEAL MANOMETRY N/A 10/12/2017   Procedure: ESOPHAGEAL MANOMETRY (EM);  Surgeon: [name], MD;  Location: [facility];  Service: Gastroenterology;  Laterality: N/A;  . ESOPHAGOGASTRODUODENOSCOPY N/A 10/12/2017   Procedure: ESOPHAGOGASTRODUODENOSCOPY (EGD);  Surgeon: [name], MD;  Location: [facility];  Service: Gastroenterology;  Laterality: N/A;  . ESOPHAGOGASTRODUODENOSCOPY (EGD) WITH ESOPHAGEAL DILATION  last one 11-09-2015  dr Yolanda Bonine   dilation schatzki's ring  . ESOPHAGOGASTRODUODENOSCOPY (EGD) WITH PROPOFOL N/A 11/17/2018   Procedure:  ESOPHAGOGASTRODUODENOSCOPY;  Surgeon: [name], MD;  Location: [facility];  Service: Endoscopy;  Laterality: N/A;  . FOREIGN BODY REMOVAL  11/17/2018   Procedure: FOREIGN BODY REMOVAL;  Surgeon: [name], MD;  Location: [facility];  Service: Endoscopy;;  . GOLD SEED IMPLANT N/A 04/29/2017   Procedure: GOLD SEED IMPLANT;  Surgeon: [name], MD;  Location: [facility];  Service: Urology;  Laterality: N/A;  . HERNIA REPAIR    . INGUINAL HERNIA REPAIR Right 02/23/2014   Procedure: HERNIA REPAIR INGUINAL ADULT;  Surgeon: Joyice Faster. Cornett, MD;  Location: [place];  Service: General;  Laterality: Right;  . INSERTION OF MESH Right 02/23/2014   Procedure: INSERTION OF MESH;  Surgeon: Joyice Faster. Cornett, MD;  Location: [place];  Service: General;  Laterality: Right;  . PERIPHERAL VASCULAR CATHETERIZATION N/A 05/29/2016   Procedure: Lower Extremity Angiography;  Surgeon: [name], MD;  Location: [place] [facility];  Service: Cardiovascular;  Laterality: N/A; [place] orbital rotational atherectomy, PTA and stenting bilateral common iliac arteries   . PILONIDAL CYST / SINUS EXCISION  2000  . PROSTATE BIOPSY  09-22-2016  dr [name] office  . RIGHT/LEFT HEART CATH AND CORONARY ANGIOGRAPHY N/A 01/25/2018   Procedure: RIGHT/LEFT HEART CATH AND CORONARY ANGIOGRAPHY;  Surgeon: [name], MD;  Location: [place] [facility];  Service: Cardiovascular;  Laterality: N/A;  . SAVORY DILATION N/A 10/12/2017   Procedure: SAVORY DILATION;  Surgeon: [name], MD;  [facility];  Service: Gastroenterology;  Laterality: N/A;  . SAVORY DILATION N/A 11/17/2018   Procedure: SAVORY DILATION;  Surgeon: [name], MD;  [facility];  Service: Endoscopy;  Laterality: N/A;  . SPACE OAR INSTILLATION N/A 04/29/2017   Procedure: SPACE OAR INSTILLATION;  Surgeon: [name], MD;  Location[facility];  Service: Urology;  Laterality: N/A;  . TRANSCATHETER AORTIC VALVE REPLACEMENT, TRANSAPICAL Left 03/16/2018   Procedure: TRANSCATHETER AORTIC VALVE REPLACEMENT, TRANSAPICAL;  Surgeon: Sherren Mocha, MD;  Location: [place];  Service: Open Heart Surgery;  Laterality: Left;  . TRANSTHORACIC ECHOCARDIOGRAM  05-05-2016   dr berry   ef 09-60%,  grade 1diastolic dysfunction/  moderate AV stenosis with mod. AR (right coronary and noncoronary cusp mobility seversly restricted);  valve area 1.25cm^2, mean gradient 27 mmHg, peak gradient 72mmHg/  mild MR  . UPPER GASTROINTESTINAL ENDOSCOPY     FHx:    Reviewed / unchanged  SHx:    Reviewed / unchanged   Systems Review:  Constitutional: Denies fever, chills, wt changes, headaches, insomnia, fatigue, night sweats, change in appetite. Eyes: Denies redness, blurred vision, diplopia, discharge, itchy, watery eyes.  ENT: Denies discharge, congestion, post nasal drip, epistaxis, sore throat, earache, hearing loss, dental pain, tinnitus, vertigo, sinus pain, snoring.  CV: Denies chest pain, palpitations, irregular heartbeat, syncope, dyspnea, diaphoresis, orthopnea, PND, claudication or edema. Respiratory: denies cough, dyspnea, DOE, pleurisy, hoarseness, laryngitis, wheezing.  Gastrointestinal: Denies dysphagia, odynophagia, heartburn, reflux, water brash, abdominal pain or cramps, nausea, vomiting, bloating, diarrhea, constipation, hematemesis, melena, hematochezia  or hemorrhoids. Genitourinary: Denies dysuria, frequency, urgency, nocturia, hesitancy, discharge, hematuria or flank pain. Musculoskeletal: Denies arthralgias, myalgias, stiffness, jt. swelling, pain, limping or strain/sprain.  Skin: Denies pruritus, rash, hives, warts, acne, eczema or change in skin lesion(s). Neuro: No weakness, tremor, incoordination, spasms, paresthesia or pain. Psychiatric: Denies confusion, memory loss or sensory loss. Endo: Denies change in weight, skin  or hair change.  Heme/Lymph: No excessive bleeding, bruising or enlarged lymph nodes.  Physical Exam  BP 116/62   Pulse 60   Temp (!) 97.2 F (36.2 C)   Resp 16   Ht 5\' 5"  (1.651 m)   Wt 121 lb 6.4 oz (55.1 kg)   BMI 20.20 kg/m   Appears  well nourished, well groomed  and in no distress.  Eyes: PERRLA, EOMs, conjunctiva no swelling or erythema. Sinuses: No frontal/maxillary tenderness ENT/Mouth: EAC's clear, TM's nl w/o erythema, bulging. Nares clear w/o erythema, swelling, exudates. Oropharynx clear without erythema or exudates. Oral hygiene is good. Tongue normal, non obstructing. Hearing intact.  Neck: Supple. Thyroid not palpable. Car 2+/2+ without bruits, nodes or JVD. Chest: Respirations nl with BS clear & equal w/o rales, rhonchi, wheezing or stridor.  Cor: Heart sounds normal w/ regular rate and rhythm without sig. murmurs, gallops, clicks or rubs. Peripheral pulses normal and equal  without edema.  Abdomen: Soft & bowel sounds normal. Non-tender w/o guarding, rebound, hernias, masses or organomegaly.  Lymphatics: Unremarkable.  Musculoskeletal: Full ROM all peripheral extremities, joint stability, 5/5 strength and normal gait.  Skin: Warm, dry without exposed rashes, lesions or ecchymosis apparent.  Neuro: Cranial nerves intact, reflexes equal bilaterally. Sensory-motor testing grossly intact. Tendon reflexes grossly intact.  Pysch: Alert & oriented x 3.  Insight and judgement nl & appropriate. No ideations.  Assessment and Plan:  1. Essential hypertension  - Continue medication, monitor blood pressure at home.  - Continue DASH diet.  Reminder to go to the ER if any CP,  SOB, nausea, dizziness, severe HA, changes vision/speech.  - CBC with Differential/Platelet - COMPLETE METABOLIC PANEL WITH GFR - Magnesium - TSH  2. H - Continue diet/meds, exercise,& lifestyle modifications.  - Continue monitor periodic cholesterol/liver & renal functions  yperlipidemia, mixed   - Lipid panel - TSH  3. Abnormal glucose  - Continue diet, exercise  - Lifestyle modifications.  - Monitor appropriate labs.  - Hemoglobin A1c - Insulin, random  4. Vitamin D deficiency  - Continue supplementation.  - VITAMIN D 25 Hydroxyl  5. Hypothyroidism  - TSH  6. Paroxysmal atrial fibrillation (HCC)  - TSH  7. Prediabetes  - Hemoglobin A1c - Insulin, random  8. Medication management  - CBC with Differential/Platelet - COMPLETE METABOLIC PANEL WITH GFR - Magnesium - Lipid panel - TSH - Hemoglobin A1c - Insulin, random - VITAMIN D 25 Hydroxyl       Discussed  regular exercise, BP monitoring, weight control to achieve/maintain BMI less than 25 and discussed med and SE's. Recommended labs to assess and monitor clinical status with further disposition pending results of labs.  I discussed the assessment and treatment plan with the patient. The patient was provided an opportunity to ask questions and all were answered. The patient agreed with the plan and demonstrated an understanding of the instructions.I provided over 30 minutes of exam, counseling, chart review and  complex critical decision making.   Kirtland Bouchard, MD

## 2018-12-09 NOTE — Patient Instructions (Signed)

## 2018-12-10 LAB — COMPLETE METABOLIC PANEL WITH GFR
AG Ratio: 1.7 (calc) (ref 1.0–2.5)
ALT: 9 U/L (ref 9–46)
AST: 14 U/L (ref 10–35)
Albumin: 4.2 g/dL (ref 3.6–5.1)
Alkaline phosphatase (APISO): 69 U/L (ref 35–144)
BUN: 19 mg/dL (ref 7–25)
CO2: 30 mmol/L (ref 20–32)
Calcium: 10.1 mg/dL (ref 8.6–10.3)
Chloride: 106 mmol/L (ref 98–110)
Creat: 0.74 mg/dL (ref 0.70–1.18)
GFR, Est African American: 102 mL/min/{1.73_m2} (ref >=60)
GFR, Est Non African American: 88 mL/min/{1.73_m2} (ref >=60)
Globulin: 2.5 g/dL (calc) (ref 1.9–3.7)
Glucose, Bld: 99 mg/dL (ref 65–99)
Potassium: 5.3 mmol/L (ref 3.5–5.3)
Sodium: 144 mmol/L (ref 135–146)
Total Bilirubin: 0.6 mg/dL (ref 0.2–1.2)
Total Protein: 6.7 g/dL (ref 6.1–8.1)

## 2018-12-10 LAB — CBC WITH DIFFERENTIAL/PLATELET
Absolute Monocytes: 302 cells/uL (ref 200–950)
Basophils Absolute: 42 cells/uL (ref 0–200)
Basophils Relative: 1 %
Eosinophils Absolute: 202 cells/uL (ref 15–500)
Eosinophils Relative: 4.8 %
HCT: 35 % — ABNORMAL LOW (ref 38.5–50.0)
Hemoglobin: 11.9 g/dL — ABNORMAL LOW (ref 13.2–17.1)
Lymphs Abs: 911 cells/uL (ref 850–3900)
MCH: 32.2 pg (ref 27.0–33.0)
MCHC: 34 g/dL (ref 32.0–36.0)
MCV: 94.6 fL (ref 80.0–100.0)
MPV: 11.1 fL (ref 7.5–12.5)
Monocytes Relative: 7.2 %
Neutro Abs: 2743 cells/uL (ref 1500–7800)
Neutrophils Relative %: 65.3 %
Platelets: 132 10*3/uL — ABNORMAL LOW (ref 140–400)
RBC: 3.7 10*6/uL — ABNORMAL LOW (ref 4.20–5.80)
RDW: 14.2 % (ref 11.0–15.0)
Total Lymphocyte: 21.7 %
WBC: 4.2 10*3/uL (ref 3.8–10.8)

## 2018-12-10 LAB — VITAMIN D 25 HYDROXY (VIT D DEFICIENCY, FRACTURES): Vit D, 25-Hydroxy: 63 ng/mL (ref 30–100)

## 2018-12-10 LAB — MAGNESIUM: Magnesium: 2.1 mg/dL (ref 1.5–2.5)

## 2018-12-10 LAB — LIPID PANEL
Cholesterol: 150 mg/dL (ref <200)
HDL: 54 mg/dL (ref >=40)
LDL Cholesterol (Calc): 75 mg/dL (calc)
Non-HDL Cholesterol (Calc): 96 mg/dL (calc) (ref <130)
Total CHOL/HDL Ratio: 2.8 (calc) (ref <5.0)
Triglycerides: 119 mg/dL (ref <150)

## 2018-12-10 LAB — HEMOGLOBIN A1C
Hgb A1c MFr Bld: 4.8 % of total Hgb (ref <5.7)
Mean Plasma Glucose: 91 (calc)
eAG (mmol/L): 5 (calc)

## 2018-12-10 LAB — INSULIN, RANDOM: Insulin: 8.6 u[IU]/mL

## 2018-12-10 LAB — TSH: TSH: 2.32 mIU/L (ref 0.40–4.50)

## 2019-02-23 ENCOUNTER — Other Ambulatory Visit (HOSPITAL_COMMUNITY): Payer: Medicare Other

## 2019-02-24 ENCOUNTER — Telehealth: Payer: Medicare Other | Admitting: Physician Assistant

## 2019-02-28 ENCOUNTER — Telehealth: Payer: Self-pay | Admitting: Physician Assistant

## 2019-02-28 NOTE — Telephone Encounter (Signed)
entered in error   

## 2019-03-01 NOTE — Progress Notes (Signed)
[facility]   MULTIDISCIPLINARY HEART VALVE TEAM  Virtual Visit via Telephone Note   This visit type was conducted due to national recommendations for restrictions regarding the COVID-19 Pandemic (e.g. social distancing) in an effort to limit this patient's exposure and mitigate transmission in our community.  Due to his co-morbid illnesses, this patient is at least at moderate risk for complications without adequate follow up.  This format is felt to be most appropriate for this patient at this time.  The patient did not have access to video technology/had technical difficulties with video requiring transitioning to audio format only (telephone).  All issues noted in this document were discussed and addressed.  No physical exam could be performed with this format.  Please refer to the patient's chart for his  consent to telehealth for [facility].   Evaluation Performed:  Follow-up visit  Date:  03/03/2019   ID:  Robert Schaefer, DOB [birth year], MRN [id number]  Patient Location: Home Provider Location: Office  PCP:  [name], MD  Cardiologist:  Quay Burow, MD / Dr. Burt Knack & Dr. Cyndia Bent (TAVR)  Chief Complaint:  1 year s/p TAVR  History of Present Illness:    Robert Schaefer is a 78 y.o. male with a history of HTN, HLD, hypothyroidism, OSA intolerant to CPAP, COPD, prostate cancer s/p XRT, PAD with small AAA and s/p bilateral common iliac artery stenting, achalasia and severe ASs/p TAVR (03/16/18) who presents for follow up.   The patient does not have symptoms concerning for COVID-19 infection (fever, chills, cough, or new shortness of breath).   Last year he was diagnosed with achalasia requiring surgical treatment. During cardiology pre op clearance he was found to have severe AS. L/RHC showed no significant CAD. Bilateral iliac artery stents were patent.  He underwent successfulTAVR with a47mm Edwards Sapien 3 THV via the transapicalapproach on 03/16/17.  Post operative echoshoweda EF 60% w/ a normally functioning TAVR valve (gradients not reported). Initial ECG showed new LBBB with 1st degree AV block, which resolved and then returned.His admission was prolonged due to chronic dementia with delirium. The patient was noted to go into afib during his admission, which is a new diagnosis for him.He spontaneously converted back to sinus. He was started on low dose Eliquis 2.5 mg BID for thromboembolic prophlaxis.  He has done quite well in follow up. He underwent successful EGD with dilation in 10/2018.  Today he presents to for follow up via telephone virtual visit. No CP. He does get some shortness of breath with moderate exertion like walking up his driveway which is on an incline. No LE edema, orthopnea or PND. No dizziness or syncope. No blood in stool or urine. No palpitations. He is doing quite well otherwise.     Past Medical History:  Diagnosis Date   AAA (abdominal aortic aneurysm) (HCC)    no longer monitoring was small area   Achalasia    Carotid stenosis, asymptomatic, bilateral    per duplex --- bilateral <50% ICA   COPD (chronic obstructive pulmonary disease) ([place])    pulmologist-  dr [name]   Diabetes mellitus without complication (HCC)    diet controlled   GERD (gastroesophageal reflux disease)    Hyperlipidemia    Hypertension    Hypothyroidism    Lesion of left lung    followed by dr [name] (pulmologist)  post infection scarring   Mild dementia ([place])    OSA (obstructive sleep apnea)    has a cpap-  intolerant; due to [place] (01/25/2018)   Peripheral vascular disease [facility])    cardiologist-- dr berry-- per last ABI/Aorta/Iliac 06-09-2016  patent bilateral iliac kissing stents, >50% stenosis bilateral iliac artery (R>L) and left external iliac artery, patent IVC   Prostate cancer [facility])    urologist-  dr [name] oncologist-  dr [name]. Dx'd 2018 s/p XRT   RLS (restless legs syndrome)    S/P TAVR  (transcatheter aortic valve replacement) 02/2018   s/p TAVR via transapical approach with an Edwards Sapien 3 THV (size 26 mm, model # 9600TFX, serial # [id number])   Schatzki's ring    Severe aortic stenosis    Past Surgical History:  Procedure Laterality Date   ABDOMINAL AORTOGRAM N/A 01/25/2018   Procedure: ABDOMINAL AORTOGRAM;  Surgeon: [name], MD;  Location: [place] [facility];  Service: Cardiovascular;  Laterality: N/A;   ANAL FISTULECTOMY  2010   BALLOON DILATION N/A 11/17/2018   Procedure: BALLOON DILATION;  Surgeon: [name], MD;  Location: [facility];  Service: Endoscopy;  Laterality: N/A;   CARDIAC CATHETERIZATION Bilateral 01/25/2018   CARDIOVASCULAR STRESS TEST  04-23-2016   dr berry   normal nuclear study w/ no ischemia/  normal LV function and wall function, nuclear stress ef 57%   CATARACT EXTRACTION W/ INTRAOCULAR LENS  IMPLANT, BILATERAL Bilateral    COLONOSCOPY     ESOPHAGEAL MANOMETRY N/A 10/12/2017   Procedure: ESOPHAGEAL MANOMETRY (EM);  Surgeon: [name], MD;  Location: [facility];  Service: Gastroenterology;  Laterality: N/A;   ESOPHAGOGASTRODUODENOSCOPY N/A 10/12/2017   Procedure: ESOPHAGOGASTRODUODENOSCOPY (EGD);  Surgeon: [name], MD;  Location: [facility];  Service: Gastroenterology;  Laterality: N/A;   ESOPHAGOGASTRODUODENOSCOPY (EGD) WITH ESOPHAGEAL DILATION  last one 11-09-2015  dr Yolanda Bonine   dilation schatzki's ring   ESOPHAGOGASTRODUODENOSCOPY (EGD) WITH PROPOFOL N/A 11/17/2018   Procedure: ESOPHAGOGASTRODUODENOSCOPY;  Surgeon: [name], MD;  Location: [facility];  Service: Endoscopy;  Laterality: N/A;   FOREIGN BODY REMOVAL  11/17/2018   Procedure: FOREIGN BODY REMOVAL;  Surgeon: [name], MD;  Location: [facility];  Service: Endoscopy;;   GOLD SEED IMPLANT N/A 04/29/2017   Procedure: GOLD SEED IMPLANT;  Surgeon: [name], MD;  Location: [facility];   Service: Urology;  Laterality: N/A;   HERNIA REPAIR     INGUINAL HERNIA REPAIR Right 02/23/2014   Procedure: HERNIA REPAIR INGUINAL ADULT;  Surgeon: Joyice Faster. Cornett, MD;  Location: [place];  Service: General;  Laterality: Right;   INSERTION OF MESH Right 02/23/2014   Procedure: INSERTION OF MESH;  Surgeon: Joyice Faster. Cornett, MD;  Location: [place];  Service: General;  Laterality: Right;   PERIPHERAL VASCULAR CATHETERIZATION N/A 05/29/2016   Procedure: Lower Extremity Angiography;  Surgeon: [name], MD;  Location: [place] [facility];  Service: Cardiovascular;  Laterality: N/A; [place] orbital rotational atherectomy, PTA and stenting bilateral common iliac arteries    PILONIDAL CYST / SINUS EXCISION  2000   PROSTATE BIOPSY  09-22-2016  dr [name] office   RIGHT/LEFT HEART CATH AND CORONARY ANGIOGRAPHY N/A 01/25/2018   Procedure: RIGHT/LEFT HEART CATH AND CORONARY ANGIOGRAPHY;  Surgeon: [name], MD;  Location: [place] [facility];  Service: Cardiovascular;  Laterality: N/A;   SAVORY DILATION N/A 10/12/2017   Procedure: SAVORY DILATION;  Surgeon: [name], MD;  Location: [facility];  Service: Gastroenterology;  Laterality: N/A;   SAVORY DILATION N/A 11/17/2018   Procedure: SAVORY DILATION;  Surgeon:  [name], MD;  Location[facility];  Service: Endoscopy;  Laterality: N/A;   SPACE OAR INSTILLATION N/A 04/29/2017   Procedure: SPACE OAR INSTILLATION;  Surgeon: [name], MD;  Location: [facility];  Service: Urology;  Laterality: N/A;   TRANSCATHETER AORTIC VALVE REPLACEMENT, TRANSAPICAL Left 03/16/2018   Procedure: TRANSCATHETER AORTIC VALVE REPLACEMENT, TRANSAPICAL;  Surgeon: Sherren Mocha, MD;  Location: [place];  Service: Open Heart Surgery;  Laterality: Left;   TRANSTHORACIC ECHOCARDIOGRAM  05-05-2016   dr berry   ef 0000000,  grade 1diastolic dysfunction/  moderate AV stenosis with  mod. AR (right coronary and noncoronary cusp mobility seversly restricted);  valve area 1.25cm^2, mean gradient 27 mmHg, peak gradient 67mmHg/  mild MR   UPPER GASTROINTESTINAL ENDOSCOPY       No outpatient medications have been marked as taking for the 03/03/19 encounter (Telemedicine) with Eileen Stanford, PA-C.     Allergies:   Penicillins, Sulfa antibiotics, and Versed [midazolam]   Social History   Tobacco Use   Smoking status: Former Smoker    Packs/day: 1.00    Years: 50.00    Pack years: 50.00    Types: Cigarettes    Quit date: 07/05/2005    Years since quitting: 13.6   Smokeless tobacco: Never Used  Substance Use Topics   Alcohol use: No   Drug use: Never     Family Hx: The patient's family history includes Asthma in his father; Heart attack in his mother; Stroke in his father. There is no history of Cancer, Colon cancer, Stomach cancer, Esophageal cancer, Rectal cancer, Pancreatic cancer, or Prostate cancer.  ROS:   Please see the history of present illness.    All other systems reviewed and are negative.   Prior CV studies:   The following studies were reviewed today:  CARDIOTHORACIC SURGERY OPERATIVE NOTE  Date of Procedure:03/16/2018  Preoperative Diagnosis:Severe Aortic Stenosis  Procedure:   Transcatheter Aortic Valve Replacement - Transapical Approach Edwards Sapien 3THV (size 54mm, model # 9600TFX, serial [id number])  Co-Surgeons:Bryan Alveria Apley, MD and Sherren Mocha, MD   Pre-operative Echo Findings: ? severe aortic stenosis ? normalleft ventricular systolic function   Post-operative Echo Findings: ? noparavalvular leak ? normalleft ventricular systolic function   __________   Echo 04/21/18 ( 1 month s/p TAVR) Study Conclusions - Left ventricle: The cavity size was normal. Systolic function was normal. The estimated ejection fraction was in the  range of 55% to 60%. Wall motion was normal; there were no regional wall motion abnormalities. There was an increased relative contribution of atrial contraction to ventricular filling. Doppler parameters are consistent with abnormal left ventricular relaxation (grade 1 diastolic dysfunction). - Aortic valve: S/P 25mm Edwards Sapien 3 bioprosthetic AV well seated with normal function. There is mild perivavlvular AI. Mean gradient (S): 11 mm Hg.   ____________   Echo 03/02/19 ( 1 year s/p TAVR) IMPRESSIONS  1. - TAVR: There is a 26 mm S2 in aortic position. The Vmax is 2.2 m/s. The peak/mean gradients are 15/9 mmHg. Mild to moderate paravalvular leak is detected in the 3 to 5 o'clock position. The degree of paravalvular leak appears simialr to the prior echo (04/21/2018). Consider TEE for better quantification of regurgitation if clinically indicated.  2. The left ventricle has normal systolic function with an ejection fraction of 60-65%. The cavity size was normal. There is mild concentric left ventricular hypertrophy. Left ventricular diastolic Doppler parameters are consistent with impaired  relaxation.  3. The right  ventricle has normal systolic function. The cavity was normal. There is no increase in right ventricular wall thickness. Right ventricular systolic pressure is normal.  4. Mildly thickened tricuspid valve leaflets.  5. The mitral valve is degenerative. Mild thickening of the mitral valve leaflet. Mild calcification of the mitral valve leaflet.  6. The tricuspid valve is grossly normal.  7. A 26 Edwards Sapien bioprosthetic aortic valve (TAVR) valve is present in the aortic position. Procedure Date: 03/16/2018 Echo findings are consistent with perivalvular leak of the aortic prosthesis.  8. Aortic valve regurgitation is mild to moderate by color flow Doppler.  9. The aorta is normal unless otherwise noted. 10. The aortic root is normal in size and structure. 11.  When compared to the prior study: No significant change from prior (04/21/2018).   Labs/Other Tests and Data Reviewed:    EKG:  No ECG reviewed.  Recent Labs: 03/11/2018: B Natriuretic Peptide 164.1 12/09/2018: ALT 9; BUN 19; Creat 0.74; Hemoglobin 11.9; Magnesium 2.1; Platelets 132; Potassium 5.3; Sodium 144; TSH 2.32   Recent Lipid Panel Lab Results  Component Value Date/Time   CHOL 150 12/09/2018 03:33 PM   TRIG 119 12/09/2018 03:33 PM   HDL 54 12/09/2018 03:33 PM   CHOLHDL 2.8 12/09/2018 03:33 PM   LDLCALC 75 12/09/2018 03:33 PM    Wt Readings from Last 3 Encounters:  12/09/18 121 lb 6.4 oz (55.1 kg)  11/17/18 125 lb (56.7 kg)  11/10/18 130 lb (59 kg)     Objective:    Vital Signs:  There were no vitals taken for this visit.   ASSESSMENT & PLAN:    Severe AS s/p TAVR: Echo from yesterday showed EF 60-65%, normally functioning TAVR with mean gradient of 8.8 mm Hg and mild-mod PVL (similar to previous echo). He is currently on aspirin and Eliquis 2.5mg  BID. He has NYHA class II symptoms. KCCQ was completed during our visit. SBE prophylaxis discussed; the patient is edentulous and does not go to the dentist. He does not need to be on both antiplatelet therapy and anticoagulation from a TAVR standpoint. See below.  Paroxysmalatrial fibrillation: on low dose Elqiuis 2.5 mg BID. He really should be on full dose Eliquis based on his age, weight and renal function for thromboembolic prophylaxis. Additionally, he just transient post op afib with no evidence of recurrence and may not need to be on long term anticoagulation at all. Will plan to place a 2 week Zio XT. If he does not have afib, we will discontinue Eliquis. If he is found to have afib, I will increase his dose to 5mg  BID until he turns 78 years old.    HTN: no BP taken today   COPD: stable   PAD: with known small AAA, right common iliac artery CTO and high-grade left common iliac artery stenosis.  He also had a 70-80%  calcified mid right SFA stenosis with three-vessel runoff bilaterally and 95% calcified ostial superior mesenteric artery stenosis. This is followed by Dr. [name].  If we stop his Eliquis for thromboembolic prophylaxis, we will continue a baby aspirin. We can consider adding a low dose Xarelto per the Compass trial with redueced major cardiovascular events and acute limb ischemia in those with chronic PAD on aspirin + low dose Xarelto.   COVID-19 Education: The signs and symptoms of COVID-19 were discussed with the patient and how to seek care for testing (follow up with PCP or arrange E-visit).  The importance of social distancing was discussed today.  Time:   Today, I have spent 20 minutes with the patient with telehealth technology discussing the above problems.     Medication Adjustments/Labs and Tests Ordered: Current medicines are reviewed at length with the patient today.  Concerns regarding medicines are outlined above.   Tests Ordered: No orders of the defined types were placed in this encounter.   Medication Changes: No orders of the defined types were placed in this encounter.   Disposition:  Follow up prn with structural heart clinic and as regularly scheduled with Dr. [name]   Signed, Angelena Form, PA-C  03/03/2019 10:30 AM    [place]

## 2019-03-02 ENCOUNTER — Ambulatory Visit (HOSPITAL_COMMUNITY): Payer: Medicare Other | Attending: Cardiovascular Disease

## 2019-03-02 ENCOUNTER — Other Ambulatory Visit: Payer: Self-pay

## 2019-03-02 DIAGNOSIS — Z952 Presence of prosthetic heart valve: Secondary | ICD-10-CM | POA: Diagnosis not present

## 2019-03-03 ENCOUNTER — Telehealth (INDEPENDENT_AMBULATORY_CARE_PROVIDER_SITE_OTHER): Payer: Medicare Other | Admitting: Physician Assistant

## 2019-03-03 ENCOUNTER — Telehealth: Payer: Medicare Other | Admitting: Physician Assistant

## 2019-03-03 ENCOUNTER — Telehealth: Payer: Self-pay | Admitting: *Deleted

## 2019-03-03 ENCOUNTER — Other Ambulatory Visit: Payer: Self-pay | Admitting: *Deleted

## 2019-03-03 DIAGNOSIS — I10 Essential (primary) hypertension: Secondary | ICD-10-CM

## 2019-03-03 DIAGNOSIS — Z952 Presence of prosthetic heart valve: Secondary | ICD-10-CM | POA: Diagnosis not present

## 2019-03-03 DIAGNOSIS — I739 Peripheral vascular disease, unspecified: Secondary | ICD-10-CM

## 2019-03-03 DIAGNOSIS — I480 Paroxysmal atrial fibrillation: Secondary | ICD-10-CM

## 2019-03-03 DIAGNOSIS — J449 Chronic obstructive pulmonary disease, unspecified: Secondary | ICD-10-CM

## 2019-03-03 NOTE — Telephone Encounter (Signed)
-----   Message from Eileen Stanford, PA-C sent at 03/03/2019 10:40 AM EDT ----- Regarding: zio XT, 2 week Can you get him set up to have a monitor mailed to him for PAF, Dr. [name] to read. Thank you!  KT

## 2019-03-03 NOTE — Patient Instructions (Signed)
Please continue on all your same medications for now. You were placed on Eliquis after you were found to have an irregular heart rhythm (called atrial fibrillation) during your hospital stay after your TAVR. This is common after surgery and may not reoccur. You are on the incorrect dose of Eliquis based on your weight, age and kidney function, but you may not need to be on this medication at all. To see if you have recurrent atrial fibrillation, I have recommended you wear a heart monitor. If we find that you do have atrial fib, we will need to continue you on Eliquis and increase the dose. If you do not have atrial fibrillation, we will stop the Eliquis and continue you on a baby aspirin. The office will mail you a monitor. Please continue regular follow up with Dr. [name].    Thank you,  Nell Range

## 2019-03-07 NOTE — Progress Notes (Signed)
Images reviewed and agree with Katie's comments. All looks stable. Would continue medical therapy.

## 2019-03-15 NOTE — Progress Notes (Deleted)
MEDICARE ANNUAL WELLNESS VISIT AND FOLLOW UP  Assessment:    Essential hypertension - continue medications, DASH diet, exercise and monitor at home. Call if greater than 130/80.  -     CBC with Differential/Platelet -     BASIC METABOLIC PANEL WITH GFR -     Hepatic function panel -     TSH -     Urinalysis, Routine w reflex microscopic -     Microalbumin / creatinine urine ratio  PVD (peripheral vascular disease) (HCC) Control blood pressure, cholesterol, glucose, increase exercise.   Atherosclerosis of aorta (HCC) Control blood pressure, cholesterol, glucose, increase exercise.   Mesenteric ischemia (HCC) Control blood pressure, cholesterol, glucose, increase exercise.   COPD No triggers, well controlled symptoms, cont to monitor  Hypothyroidism, unspecified type Hypothyroidism-check TSH level, continue medications the same, reminded to take on an empty stomach 30-61mins before food.  -     TSH  Pure hypercholesterolemia -continue medications, check lipids, decrease fatty foods, increase activity.  -     Lipid panel  Medication management -     Magnesium  Claudication (HCC) Control blood pressure, cholesterol, glucose, increase exercise.   Advanced care planning/counseling discussion Discussed with patient, will bring in papers With up coming surgery had a long discussion about this and wishes  Aortic stenosis, moderate + symptoms, getting TAVR on the 17th, discussed further with patient.   OSA and COPD overlap syndrome (HCC) Not on CPAP, states can not wear, STRONGLY SUGGEST getting on it with heart/lung history, given information and will consider  COPD with asthma ([place]) Not on CPAP, states can not wear, STRONGLY SUGGEST getting on it with heart/lung history, given information and will consider Follow up pulmonary  Cavitating mass in left upper lung lobe Follow up pulmonary  Hiatal hernia Monitor  Gastroesophageal reflux disease, esophagitis presence  not specified Continue PPI/H2 blocker, diet discussed  Irritable bowel syndrome, unspecified type If not on benefiber then add it, decrease stress,  if any worsening symptoms, blood in stool, AB pain, etc call office  BPH w/LUTS monitor  Prediabetes Discussed general issues about diabetes pathophysiology and management., Educational material distributed., Suggested low cholesterol diet., Encouraged aerobic exercise., Discussed foot care., Reminded to get yearly retinal exam.  RLS (restless legs syndrome)  Vitamin D deficiency Continue supplement  Prostate cancer ([place]) Continue follow up Following with Dr. [name]  Senile purpura  Discussed process, protect skin, sunscreen   Over 30 minutes of exam, counseling, chart review and critical decision making was performed   Future Appointments  Date Time Provider [place]  03/16/2019  9:00 AM Vicie Mutters, PA-C [facility] None  06/17/2019 11:00 AM [name], MD [facility] None  06/20/2019 10:00 AM [facility]  06/20/2019 11:00 AM [facility]     Plan:   During the course of the visit the patient was educated and counseled about appropriate screening and preventive services including:    Pneumococcal vaccine  Prevnar 13   Influenza vaccine  Td vaccine  Screening electrocardiogram  Bone densitometry screening  Colorectal cancer screening  Diabetes screening  Glaucoma screening  Nutrition counseling   Advanced directives: requested   Subjective:  Robert Schaefer is a 78 y.o. male who presents for Medicare Annual Wellness Visit and follow up  He has pain and decreased hearing in his right ear. Has not driven in a year.   He has had elevated blood pressure since . His blood pressure has been  controlled at home, today their BP is   He does not workout. He denies chest pain, shortness of breath, dizziness.   History of PAD s/p  intervention with Dr.  [name] to right common iliac and mesenteric. S/p TAVR 2019 for severe AS,  normal stress test 03/2016.  He is on eliquis for pAF after his TAVR.  He has COPD, is ex smoker, quit 10 years ago. Patient is also being followed by Dr [name] for a LUL cavitary lesion, speculated infectious due to aspiration.   BMI is There is no height or weight on file to calculate BMI., he is working on diet and exercise. Wt Readings from Last 3 Encounters:  12/09/18 121 lb 6.4 oz (55.1 kg)  11/17/18 125 lb (56.7 kg)  11/10/18 130 lb (59 kg)   He is following with Dr. [name] for prostate cancer, seeing every 3-6 months.   He is not on cholesterol medication and denies myalgias. His cholesterol is not at goal of 70 or less. The cholesterol last visit was:   Lab Results  Component Value Date   CHOL 150 12/09/2018   HDL 54 12/09/2018   LDLCALC 75 12/09/2018   TRIG 119 12/09/2018   CHOLHDL 2.8 12/09/2018   He has had prediabetes for years. He has been working on diet and exercise for prediabetes, and denies foot ulcerations, hyperglycemia, hypoglycemia , increased appetite, paresthesia of the feet, polydipsia, polyuria, visual disturbances and weight loss. Last A1C in the office was:  Lab Results  Component Value Date   HGBA1C 4.8 12/09/2018   Last GFR: Lab Results  Component Value Date   GFRNONAA 88 12/09/2018   Patient is on Vitamin D supplement, 7000 a day.   Lab Results  Component Value Date   VD25OH 39 12/09/2018   He is on thyroid medication. His medication was not changed last visit.   Lab Results  Component Value Date   TSH 2.32 12/09/2018  .   Medication Review: Current Outpatient Medications on File Prior to Visit  Medication Sig Dispense Refill  . albuterol (PROVENTIL HFA;VENTOLIN HFA) 108 (90 Base) MCG/ACT inhaler Inhale 1 puff into the lungs every 6 (six) hours as needed for wheezing or shortness of breath.    . alfuzosin (UROXATRAL) 10 MG 24 hr tablet Take 10 mg by mouth daily.     [name]  apixaban (ELIQUIS) 2.5 MG TABS tablet Take 1 tablet (2.5 mg total) by mouth 2 (two) times daily. 60 tablet 6  . aspirin EC 81 MG tablet Take 81 mg by mouth daily.    . Cholecalciferol (VITAMIN D-3) 5000 units TABS Take 5,000 Units by mouth daily at 3 pm.    . feeding supplement, ENSURE ENLIVE, (ENSURE ENLIVE) LIQD Take 237 mLs by mouth 5 (five) times daily.     . fluticasone (FLONASE) 50 MCG/ACT nasal spray Place 2 sprays into both nostrils daily as needed for allergies or rhinitis.     [name] levothyroxine (SYNTHROID, LEVOTHROID) 50 MCG tablet TAKE 1 TABLET BY MOUTH ONCE DAILY (Patient taking differently: Take 50 mcg by mouth daily before breakfast. ) 90 tablet 3  . omeprazole (PRILOSEC) 40 MG capsule Take 1 capsule (40 mg total) by mouth 2 (two) times daily. TAKE 1 CAPSULE BY MOUTH TWICE A DAY (Patient taking differently: Take 40 mg by mouth 2 (two) times daily. ) 180 capsule 1   No current facility-administered medications on file prior to visit.     Current Problems (verified) Patient Active Problem List  Diagnosis Date Noted  . Abnormal glucose 12/09/2018  . Esophageal diverticular disease   . Loss of weight   . Severe protein-calorie malnutrition ([place])   . Paroxysmal atrial fibrillation ([place]) 06/15/2018  . S/P TAVR (transcatheter aortic valve replacement) 03/16/2018  . Prostate cancer ([place])   . OSA (obstructive sleep apnea)   . Achalasia   . Severe aortic stenosis   . Esophageal stricture   . Cavitating mass in left upper lung lobe 08/12/2016  . PVD (peripheral vascular disease) ([place]) 05/31/2016  . Hypothyroidism 11/07/2014  . BPH w/LUTS 11/07/2014  . Medication management 04/11/2014  . Hyperlipidemia, mixed   . RLS (restless legs syndrome)   . Essential hypertension 09/28/2009  . GERD 09/28/2009    Screening Tests Immunization History  Administered Date(s) Administered  . DT 09/22/2013  . Influenza, High Dose Seasonal PF 04/11/2014, 04/20/2015  . Zoster 06/30/2005     Preventative care: Last colonoscopy: 2011 PFT 07/2016 EGD 2019 CT chest 01/2018 MGM 2015 US carotid 09/2014 Echo 12/2017 Stress test 03/2016 Cath 12/2017  Prior vaccinations: TD or Tdap: 2015  Influenza: 2016  Pneumococcal:  Prevnar13:  Shingles/Zostavax: 2007  Names of Other Physician/Practitioners you currently use: 1. [facility] here for primary care 2. Does not see one in particular, eye doctor, last visit 2015 3. Dr.Jones , dentist, last visit 2017  Patient Care Team: [name], MD as PCP - General (Internal Medicine) [name], MD as PCP - Cardiology (Cardiology) Jari Pigg, MD as Consulting Physician (Dermatology) Larey Dresser, MD as Consulting Physician (Cardiology) Carolan Clines, MD (Inactive) as Consulting Physician (Urology) Erroll Luna, MD as Consulting Physician (General Surgery) Josue Hector, MD as Consulting Physician (Cardiology) Michael Boston, MD as Consulting Physician (General Surgery) Armbruster, Carlota Raspberry, MD as Consulting Physician (Gastroenterology)  Allergies Allergies  Allergen Reactions  . Penicillins Anaphylaxis, Itching, Swelling and Other (See Comments)    Did it involve swelling of the face/tongue/throat, SOB, or low BP? Yes Did it involve sudden or severe rash/hives, skin peeling, or any reaction on the inside of your mouth or nose? No Did you need to seek medical attention at a hospital or doctor's office? Yes When did it last happen?Many years ago If all above answers are "NO", may proceed with cephalosporin use.   . Sulfa Antibiotics Anaphylaxis and Swelling  . Versed [Midazolam] Other (See Comments)    Confusion, delerium    SURGICAL HISTORY He  has a past surgical history that includes Colonoscopy; Pilonidal cyst / sinus excision (2000); Insertion of mesh (Right, 02/23/2014); Inguinal hernia repair (Right, 02/23/2014); Hernia repair; Cataract extraction w/  intraocular lens  implant, bilateral (Bilateral); Esophagogastroduodenoscopy (egd) with esophageal dilation (last one 11-09-2015  dr Yolanda Bonine); Cardiac catheterization (N/A, 05/29/2016); Prostate biopsy (09-22-2016  dr [name] office); Cardiovascular stress test (04-23-2016   dr berry); transthoracic echocardiogram (05-05-2016   dr berry); Gold seed implant (N/A, 04/29/2017); SPACE OAR INSTILLATION (N/A, 04/29/2017); Upper gastrointestinal endoscopy; Esophageal manometry (N/A, 10/12/2017); Esophagogastroduodenoscopy (N/A, 10/12/2017); Savory dilation (N/A, 10/12/2017); Cardiac catheterization (Bilateral, 01/25/2018); Anal fistulectomy (2010); RIGHT/LEFT HEART CATH AND CORONARY ANGIOGRAPHY (N/A, 01/25/2018); ABDOMINAL AORTOGRAM (N/A, 01/25/2018); Transcatheter aortic valve replacement, transapical (Left, 03/16/2018); Esophagogastroduodenoscopy (egd) with propofol (N/A, 11/17/2018); Savory dilation (N/A, 11/17/2018); Balloon dilation (N/A, 11/17/2018); and Foreign Body Removal (11/17/2018). FAMILY HISTORY His family history includes Asthma in his father; Heart attack in his mother; Stroke in his father. SOCIAL HISTORY He  reports that he quit smoking about 13 years ago. His smoking use  included cigarettes. He has a 50.00 pack-year smoking history. He has never used smokeless tobacco. He reports that he does not drink alcohol or use drugs.  MEDICARE WELLNESS OBJECTIVES: Physical activity:   Cardiac risk factors:   Depression/mood screen:   Depression screen [facility] 2/9 12/11/2018  Decreased Interest 0  Down, Depressed, Hopeless 0  PHQ - 2 Score 0  Some recent data might be hidden    ADLs:  In your present state of health, do you have any difficulty performing the following activities: 12/11/2018 07/05/2018  Hearing? N N  Vision? N N  Difficulty concentrating or making decisions? N N  Walking or climbing stairs? N N  Dressing or bathing? N N  Doing errands, shopping? N N  Some recent data might be hidden      Cognitive Testing  Alert? Yes  Normal Appearance?Yes  Oriented to person? Yes  Place? Yes   Time? Yes  Recall of three objects?  Yes  Can perform simple calculations? Yes  Displays appropriate judgment?Yes  Can read the correct time from a watch face?Yes  EOL planning:    Review of Systems  Constitutional: Negative for chills, fever and malaise/fatigue.  HENT: Negative for congestion and sore throat.   Respiratory: Negative for cough, shortness of breath and wheezing.   Cardiovascular: Negative for chest pain, palpitations and leg swelling.  Gastrointestinal: Negative for abdominal pain, blood in stool, constipation, diarrhea, heartburn, melena, nausea and vomiting.  Genitourinary: Negative.   Musculoskeletal: Positive for joint pain and myalgias.  Skin: Negative.   Neurological: Negative for dizziness, sensory change, loss of consciousness and headaches.  Psychiatric/Behavioral: Negative for depression. The patient is not nervous/anxious and does not have insomnia.      Objective:     There were no vitals filed for this visit. There is no height or weight on file to calculate BMI.  General appearance: alert, no distress, WD/WN, male HEENT: normocephalic, sclerae anicteric, TMs pearly, nares patent, no discharge or erythema, pharynx normal Oral cavity: MMM, no lesions Neck: supple, no lymphadenopathy, no thyromegaly, no masses Heart: RRR, normal S1, S2, crescendo decresendo murmur to the 2nd right ICS with radiation to the LSB Lungs: CTA bilaterally, no wheezes, rhonchi, or rales Abdomen: +bs, soft, non tender, non distended, no masses, no hepatomegaly, no splenomegaly Musculoskeletal: nontender, no swelling, no obvious deformity Extremities: no edema, no cyanosis, no clubbing Pulses: 2+ symmetric, upper and lower extremities, normal cap refill Neurological: alert, oriented x 3, CN2-12 intact, strength normal upper extremities and lower extremities, sensation normal  throughout, DTRs 2+ throughout, no cerebellar signs, gait Normal Psychiatric: normal affect, behavior normal, pleasant   Medicare Attestation I have personally reviewed: The patient's medical and social history Their use of alcohol, tobacco or illicit drugs Their current medications and supplements The patient's functional ability including ADLs,fall risks, home safety risks, cognitive, and hearing and visual impairment Diet and physical activities Evidence for depression or mood disorders  The patient's weight, height, BMI, and visual acuity have been recorded in the chart.  I have made referrals, counseling, and provided education to the patient based on review of the above and I have provided the patient with a written personalized care plan for preventive services.     Vicie Mutters, PA-C   03/15/2019

## 2019-03-16 ENCOUNTER — Ambulatory Visit: Payer: Self-pay | Admitting: Physician Assistant

## 2019-03-22 ENCOUNTER — Telehealth: Payer: Self-pay | Admitting: *Deleted

## 2019-03-22 NOTE — Telephone Encounter (Signed)
14 day ZIO XT long term holter monitor to be mailed to the patients home.  Instructions reviewed briefly as they are included in the monitor kit. 

## 2019-03-26 ENCOUNTER — Ambulatory Visit (INDEPENDENT_AMBULATORY_CARE_PROVIDER_SITE_OTHER): Payer: Medicare Other

## 2019-03-26 DIAGNOSIS — I480 Paroxysmal atrial fibrillation: Secondary | ICD-10-CM | POA: Diagnosis not present

## 2019-04-19 DIAGNOSIS — I480 Paroxysmal atrial fibrillation: Secondary | ICD-10-CM | POA: Diagnosis not present

## 2019-04-27 ENCOUNTER — Telehealth: Payer: Self-pay | Admitting: *Deleted

## 2019-04-27 NOTE — Telephone Encounter (Addendum)
Left message for pt to call   ----- Message from Eileen Stanford, PA-C sent at 04/26/2019  4:02 PM EDT ----- Suella Broad,  I ordered this to assess for afib. There doesn't seem to be any afib in which case his Eliquis can be discontinued and he can remain on a baby aspirin. It looks like he has had some bradycardia and pauses. If [name] thinks he needs ROV he can see him in the office or he can be seen by one of his Apps. I will result the note with the recommendation to stop eliquis and continue on a baby aspirin. Thank you!!KT ----- Message ----- From: Cristopher Estimable, RN Sent: 04/26/2019   3:46 PM EDT To: Eileen Stanford, PA-C  Looks like you ordered this monitor. ----- Message ----- From: [name], MD Sent: 04/26/2019   7:39 AM EDT To: Cristopher Estimable, RN  1. SR/SB/ST 2. Occasional PVCs 3. HR as low as the 30s with 3 sec pauses. 4. Needs ROV to discuss

## 2019-04-27 NOTE — Addendum Note (Signed)
Addended by: Cristopher Estimable on: 04/27/2019 02:45 PM   Modules accepted: Orders

## 2019-04-27 NOTE — Telephone Encounter (Signed)
Spoke with pt, aware to stop eliquis and continue aspirin 81 mg once daily. Follow up scheduled

## 2019-05-03 ENCOUNTER — Other Ambulatory Visit: Payer: Self-pay | Admitting: Gastroenterology

## 2019-05-20 ENCOUNTER — Telehealth: Payer: Self-pay | Admitting: Cardiovascular Disease

## 2019-05-20 NOTE — Telephone Encounter (Signed)
Daughter of the patient wants to make sure the patient's wife can come back with the patient during his upcoming appointment. The patient gets disoriented and has a hard time getting to the office. Once he gets in the waiting room, as long as he is with a staff member he will be fine. He will just need assistance before and after he enters the waiting room. Going up and down elevators, or to offices not directly adjacent to the main entrance are hard for him.  If the office is unable to reach the daughter, please leave a message on her answering machine.

## 2019-05-23 NOTE — Telephone Encounter (Signed)
Will route to primary nurse to make aware for appointment. 11/24

## 2019-05-23 NOTE — Telephone Encounter (Signed)
LVM for pt to let him know Dr [name] approved wife to accompany

## 2019-05-24 ENCOUNTER — Other Ambulatory Visit: Payer: Self-pay

## 2019-05-24 ENCOUNTER — Ambulatory Visit (INDEPENDENT_AMBULATORY_CARE_PROVIDER_SITE_OTHER): Payer: Medicare Other | Admitting: Cardiovascular Disease

## 2019-05-24 ENCOUNTER — Encounter: Payer: Self-pay | Admitting: Cardiovascular Disease

## 2019-05-24 VITALS — BP 147/71 | HR 62 | Temp 96.0°F | Ht 65.0 in | Wt 123.6 lb

## 2019-05-24 DIAGNOSIS — I495 Sick sinus syndrome: Secondary | ICD-10-CM

## 2019-05-24 DIAGNOSIS — I480 Paroxysmal atrial fibrillation: Secondary | ICD-10-CM

## 2019-05-24 DIAGNOSIS — R001 Bradycardia, unspecified: Secondary | ICD-10-CM

## 2019-05-24 DIAGNOSIS — I714 Abdominal aortic aneurysm, without rupture, unspecified: Secondary | ICD-10-CM

## 2019-05-24 DIAGNOSIS — I739 Peripheral vascular disease, unspecified: Secondary | ICD-10-CM | POA: Diagnosis not present

## 2019-05-24 DIAGNOSIS — Z952 Presence of prosthetic heart valve: Secondary | ICD-10-CM

## 2019-05-24 DIAGNOSIS — I455 Other specified heart block: Secondary | ICD-10-CM

## 2019-05-24 DIAGNOSIS — E782 Mixed hyperlipidemia: Secondary | ICD-10-CM | POA: Diagnosis not present

## 2019-05-24 NOTE — Assessment & Plan Note (Signed)
History of essential hypertension blood pressure measured today 147/71.  He is not on any antihypertensive medications.

## 2019-05-24 NOTE — Assessment & Plan Note (Signed)
History of PAF at the time of TAVR not seen on recent 2-week Zio patch and therefore Eliquis was discontinued.

## 2019-05-24 NOTE — Assessment & Plan Note (Signed)
History of TAVR for severe aortic stenosis via the transapical approach by Drs. Cooper and Stella 03/16/2018 with follow-up echo performed 03/02/2019 revealing normal LV function with a well-functioning aortic bioprosthesis.  There were findings consistent with a perivalvular leak however with mild to moderate AI.

## 2019-05-24 NOTE — Addendum Note (Signed)
Addended by: Cain Sieve on: 05/24/2019 04:30 PM   Modules accepted: Orders

## 2019-05-24 NOTE — Assessment & Plan Note (Signed)
History of hyperlipidemia not on statin therapy with lipid profile performed 12/09/2018 revealing total cholesterol 150, LDL 75 and HDL of 54.

## 2019-05-24 NOTE — Progress Notes (Signed)
05/24/2019 Robert Schaefer   02/06/1941  [id number]  Primary Physician [name], MD Primary Cardiologist: [name] MD Lupe Carney, [place]  HPI:  Robert Schaefer is a 78 y.o.   married Caucasian male father of 2 children, grandfather 5 grandchildren who is covering by his wife Robert Schaefer and daughter Robert Schaefer. He was referred by Dr. Melford Aase for cardiovascular evaluation because of chest pain and claudication. I last saw him in the office  06/15/2018. His cardiovascular risk factor profile is notable for 100 pack years of tobacco abuse having quit 10 years ago. He has treated hypertension and hyperlipidemia. There is a family history of heart disease with a mother who died of a myocardial infarction age 85. He has never had a heart attack or stroke. He has had recent chest pain with a CT angiogram that ruled out pulmonary embolus but did show diffuse coronary calcification. An abdominal pelvic CT performed because of unexplained weight loss showed aortoiliac calcification size/atherosclerosis as well as atherosclerosis of the superior mesenteric artery and left renal artery. He complains of right greater than leftleg claudication of the last year occurring after walking 4-5 minutes. Since I saw him in the office he's had a Myoview stress test which was nonischemic problem, a 2-D echo that showed normal LV function with moderate aortic stenosis, carotid Dopplers that showed no evidence of ICA stenosis and lower extremity Dopplers reveal a right ABI of 0.48 with an occluded right common iliac and a left ABI of 1.23 with moderate left common iliac artery stenosis. He also complains a 30 pound weight loss of and had SMA calcification on abdominal CT. He may have mesenteric ischemia as well.He underwent peripheral angiography and intervention by myself 05/29/16 demonstrating a small abdominal aortic aneurysm, 95% calcified ostial superior mesenteric artery stenosis, right common iliac chronic  total occlusion was calcified and high-grade common iliac artery stenosis. He underwent diamondback orbital rotational atherectomy, PTA and covered stenting using Lifestream covered stents" of both common iliac arteries using "kissing stent technique. His claudication symptoms have resolved. The ABIs are normal. She does have a high-frequency signal in his right common iliac artery demonstrated a duplex ultrasound 06/09/16 was asymptomatic from this.  Since I did his heart cath 01/25/2018 which revealed critical left ear, normal coronary arteries and widely patent iliac stents he underwent a transapical TAVR procedure by Drs. Cooper and [place] with an excellent clinical result.  His follow-up echo revealed a normally functioning bioprosthetic aortic prosthesis, normal LV function and a perivalvular leak with mild to moderate AI.Robert Schaefer  He is clinically improved.  He was noted to be in PAF transiently during his hospitalization converting to sinus rhythm and he was begun on low-dose Eliquis.  Since I saw him 11 months ago he continues to do well.  He denies chest pain shortness of breath or claudication.  His recent lower extremity Dopplers showed his stents to be widely patent.  He did have a 2-week Zio patch performed to see whether he had any PAF which he did not and therefore Eliquis was discontinued although he did have sinus bradycardia in the 30s with 3-second pauses.   Current Meds  Medication Sig   albuterol (PROVENTIL HFA;VENTOLIN HFA) 108 (90 Base) MCG/ACT inhaler Inhale 1 puff into the lungs every 6 (six) hours as needed for wheezing or shortness of breath.   aspirin EC 81 MG tablet Take 81 mg by mouth daily.   Cholecalciferol (VITAMIN D-3) 5000 units TABS Take  5,000 Units by mouth daily at 3 pm.   feeding supplement, ENSURE ENLIVE, (ENSURE ENLIVE) LIQD Take 237 mLs by mouth 5 (five) times daily.    fluticasone (FLONASE) 50 MCG/ACT nasal spray Place 2 sprays into both nostrils daily as needed  for allergies or rhinitis.    levothyroxine (SYNTHROID, LEVOTHROID) 50 MCG tablet TAKE 1 TABLET BY MOUTH ONCE DAILY (Patient taking differently: Take 50 mcg by mouth daily before breakfast. )   omeprazole (PRILOSEC) 40 MG capsule Take 1 capsule by mouth twice daily     Allergies  Allergen Reactions   Penicillins Anaphylaxis, Itching, Swelling and Other (See Comments)    Did it involve swelling of the face/tongue/throat, SOB, or low BP? Yes Did it involve sudden or severe rash/hives, skin peeling, or any reaction on the inside of your mouth or nose? No Did you need to seek medical attention at a hospital or doctor's office? Yes When did it last happen?Many years ago If all above answers are NO, may proceed with cephalosporin use.    Sulfa Antibiotics Anaphylaxis and Swelling   Versed [Midazolam] Other (See Comments)    Confusion, delerium    Social History   Socioeconomic History   Marital status: Married    Spouse name: Not on file   Number of children: Not on file   Years of education: Not on file   Highest education level: Not on file  Occupational History   Not on file  Social Needs   Financial resource strain: Not on file   Food insecurity    Worry: Not on file    Inability: Not on file   Transportation needs    Medical: Not on file    Non-medical: Not on file  Tobacco Use   Smoking status: Former Smoker    Packs/day: 1.00    Years: 50.00    Pack years: 50.00    Types: Cigarettes    Quit date: 07/05/2005    Years since quitting: 13.8   Smokeless tobacco: Never Used  Substance and Sexual Activity   Alcohol use: No   Drug use: Never   Sexual activity: Not Currently  Lifestyle   Physical activity    Days per week: Not on file    Minutes per session: Not on file   Stress: Not on file  Relationships   Social connections    Talks on phone: Not on file    Gets together: Not on file    Attends religious service: Not on file    Active  member of club or organization: Not on file    Attends meetings of clubs or organizations: Not on file    Relationship status: Not on file   Intimate partner violence    Fear of current or ex partner: Not on file    Emotionally abused: Not on file    Physically abused: Not on file    Forced sexual activity: Not on file  Other Topics Concern   Not on file  Social History Narrative   Not on file     Review of Systems: General: negative for chills, fever, night sweats or weight changes.  Cardiovascular: negative for chest pain, dyspnea on exertion, edema, orthopnea, palpitations, paroxysmal nocturnal dyspnea or shortness of breath Dermatological: negative for rash Respiratory: negative for cough or wheezing Urologic: negative for hematuria Abdominal: negative for nausea, vomiting, diarrhea, bright red blood per rectum, melena, or hematemesis Neurologic: negative for visual changes, syncope, or dizziness All other systems reviewed and  are otherwise negative except as noted above.    Blood pressure (!) 147/71, Schaefer 62, temperature (!) 96 F (35.6 C), height 5\' 5"  (1.651 m), weight 123 lb 9.6 oz (56.1 kg), SpO2 100 %.  General appearance: alert and no distress Neck: no adenopathy, no carotid bruit, no JVD, supple, symmetrical, trachea midline and thyroid not enlarged, symmetric, no tenderness/mass/nodules Lungs: clear to auscultation bilaterally Heart: regular rate and rhythm, S1, S2 normal, no murmur, click, rub or gallop Extremities: extremities normal, atraumatic, no cyanosis or edema Pulses: 2+ and symmetric Skin: Skin color, texture, turgor normal. No rashes or lesions Neurologic: Alert and oriented X 3, normal strength and tone. Normal symmetric reflexes. Normal coordination and gait  EKG sinus rhythm at 60 with left bundle branch block.  Personally reviewed this EKG.  ASSESSMENT AND PLAN:   Essential hypertension History of essential hypertension blood pressure measured  today 147/71.  He is not on any antihypertensive medications.  Hyperlipidemia, mixed History of hyperlipidemia not on statin therapy with lipid profile performed 12/09/2018 revealing total cholesterol 150, LDL 75 and HDL of 54.  PVD (peripheral vascular disease) ([place]) History of peripheral arterial disease status post PTA and stenting of the right common iliac artery CTO and stenting of a high-grade left common iliac artery stenosis 05/29/2016.  He did have a small abdominal aortic aneurysm.  He also had a 95% ostial SMA stenosis.  He denies claudication, lower extremity arterial Doppler studies performed 06/15/2018 revealed normal ABIs bilaterally with patent stents.  S/P TAVR (transcatheter aortic valve replacement) History of TAVR for severe aortic stenosis via the transapical approach by Drs. Cooper and [place] 03/16/2018 with follow-up echo performed 03/02/2019 revealing normal LV function with a well-functioning aortic bioprosthesis.  There were findings consistent with a perivalvular leak however with mild to moderate AI.  Paroxysmal atrial fibrillation (HCC) History of PAF at the time of TAVR not seen on recent 2-week Zio patch and therefore Eliquis was discontinued.  Sinus bradycardia Recent 2-week Zio patch showed sinus rhythm with sinus bradycardia in the 30s and 3-second pauses.  He does complain of weakness.  He is on no negative chronotropic drugs.  I am referring him to EP for further evaluation.      [name] MD FACP,FACC,FAHA, [facility] 05/24/2019 4:23 PM

## 2019-05-24 NOTE — Assessment & Plan Note (Signed)
Recent 2-week Zio patch showed sinus rhythm with sinus bradycardia in the 30s and 3-second pauses.  He does complain of weakness.  He is on no negative chronotropic drugs.  I am referring him to EP for further evaluation.

## 2019-05-24 NOTE — Patient Instructions (Signed)
Medication Instructions:  Your physician recommends that you continue on your current medications as directed. Please refer to the Current Medication list given to you today.  If you need a refill on your cardiac medications before your next appointment, please call your pharmacy.   Lab work: NONE  Testing/Procedures: NONE  Follow-Up: At [facility], you and your health needs are our priority.  As part of our continuing mission to provide you with exceptional heart care, we have created designated Provider Care Teams.  These Care Teams include your primary Cardiologist (physician) and Advanced Practice Providers (APPs -  Physician Assistants and Nurse Practitioners) who all work together to provide you with the care you need, when you need it. You may see Quay Burow, MD or one of the following Advanced Practice Providers on your designated Care Team:    Kerin Ransom, PA-C  [place], [place]  Coletta Memos, [place]  Your physician wants you to follow-up in: 6 months. You will receive a reminder letter in the mail two months in advance. If you don't receive a letter, please call our office to schedule the follow-up appointment.'  Any Other Special Instructions Will Be Listed Below (If Applicable). You have been referred to Electrophysiology.

## 2019-05-24 NOTE — Assessment & Plan Note (Signed)
History of peripheral arterial disease status post PTA and stenting of the right common iliac artery CTO and stenting of a high-grade left common iliac artery stenosis 05/29/2016.  He did have a small abdominal aortic aneurysm.  He also had a 95% ostial SMA stenosis.  He denies claudication, lower extremity arterial Doppler studies performed 06/15/2018 revealed normal ABIs bilaterally with patent stents.

## 2019-05-31 DIAGNOSIS — C61 Malignant neoplasm of prostate: Secondary | ICD-10-CM | POA: Diagnosis not present

## 2019-06-03 ENCOUNTER — Ambulatory Visit: Payer: Medicare Other | Admitting: Physician Assistant

## 2019-06-08 ENCOUNTER — Other Ambulatory Visit: Payer: Self-pay | Admitting: Pharmacist

## 2019-06-08 ENCOUNTER — Ambulatory Visit: Payer: Medicare Other | Admitting: Physician Assistant

## 2019-06-09 ENCOUNTER — Other Ambulatory Visit: Payer: Self-pay | Admitting: Cardiovascular Disease

## 2019-06-10 NOTE — Telephone Encounter (Signed)
32M 56.1 kg SCr 0.74 (11/2018), LOV [name] 05/2019

## 2019-06-13 DIAGNOSIS — R351 Nocturia: Secondary | ICD-10-CM | POA: Diagnosis not present

## 2019-06-13 DIAGNOSIS — C61 Malignant neoplasm of prostate: Secondary | ICD-10-CM | POA: Diagnosis not present

## 2019-06-16 ENCOUNTER — Encounter: Payer: Self-pay | Admitting: Cardiology

## 2019-06-16 ENCOUNTER — Other Ambulatory Visit: Payer: Self-pay

## 2019-06-16 ENCOUNTER — Ambulatory Visit (INDEPENDENT_AMBULATORY_CARE_PROVIDER_SITE_OTHER): Payer: Medicare Other | Admitting: Cardiology

## 2019-06-16 ENCOUNTER — Other Ambulatory Visit (HOSPITAL_COMMUNITY): Payer: Medicare Other

## 2019-06-16 VITALS — BP 124/70 | HR 68 | Ht 65.0 in | Wt 121.0 lb

## 2019-06-16 DIAGNOSIS — I495 Sick sinus syndrome: Secondary | ICD-10-CM

## 2019-06-16 DIAGNOSIS — I480 Paroxysmal atrial fibrillation: Secondary | ICD-10-CM

## 2019-06-16 NOTE — Progress Notes (Signed)
Electrophysiology Office Note   Date:  06/16/2019   ID:  Robert, Schaefer Sep 30, 1940, MRN [id number]  PCP:  [name], MD  Cardiologist:  [name] Primary Electrophysiologist:  [name] [name], MD    Chief Complaint: AF   History of Present Illness: Robert Schaefer is a 78 y.o. male who is being seen today for the evaluation of AF at the request of [name], MD. Presenting today for electrophysiology evaluation.  He has a history of AAA, carotid stenosis, COPD, diabetes, hypertension, hyperlipidemia, PAD, OSA, aortic stenosis status post TAVR.  He recently wore a 2week Zio patch which showed sinus bradycardia in 30s with 3-second pauses.  There was no atrial fibrillation noted.  He has been having intermittent symptoms of weakness and fatigue.  There is no pattern to the symptoms.  While wearing his cardiac monitor, he felt well without complaints.  He did not have any weakness, fatigue, or dizziness.  He says that his episodes occur every few weeks.  Sometimes he has multiple days that he feels this way.  Today, he denies symptoms of palpitations, chest pain, shortness of breath, orthopnea, PND, lower extremity edema, claudication, dizziness, presyncope, syncope, bleeding, or neurologic sequela. The patient is tolerating medications without difficulties.    Past Medical History:  Diagnosis Date  . AAA (abdominal aortic aneurysm) ([name])    no longer monitoring was small area  . Achalasia   . Carotid stenosis, asymptomatic, bilateral    per duplex --- bilateral <50% ICA  . COPD (chronic obstructive pulmonary disease) ([name])    pulmologist-  dr [name]  . Diabetes mellitus without complication (HCC)    diet controlled  . GERD (gastroesophageal reflux disease)   . Hyperlipidemia   . Hypertension   . Hypothyroidism   . Lesion of left lung    followed by dr [name] (pulmologist)  post infection scarring  . Mild dementia ([place])   . OSA (obstructive sleep apnea)    has a  cpap-  intolerant; due to Claustaphobia (01/25/2018)  . Peripheral vascular disease [facility])    cardiologist-- dr berry-- per last ABI/Aorta/Iliac 06-09-2016  patent bilateral iliac kissing stents, >50% stenosis bilateral iliac artery (R>L) and left external iliac artery, patent IVC  . Prostate cancer [facility])    urologist-  dr [name] oncologist-  dr [name]. Dx'd 2018 s/p XRT  . RLS (restless legs syndrome)   . S/P TAVR (transcatheter aortic valve replacement) 02/2018   s/p TAVR via transapical approach with an Edwards Sapien 3 THV (size 26 mm, model # 9600TFX, serial # [id number])  . Schatzki's ring   . Severe aortic stenosis    Past Surgical History:  Procedure Laterality Date  . ABDOMINAL AORTOGRAM N/A 01/25/2018   Procedure: ABDOMINAL AORTOGRAM;  Surgeon: [name], MD;  Location: [place] [facility];  Service: Cardiovascular;  Laterality: N/A;  . ANAL FISTULECTOMY  2010  . BALLOON DILATION N/A 11/17/2018   Procedure: BALLOON DILATION;  Surgeon: [name], MD;  Location: [facility];  Service: Endoscopy;  Laterality: N/A;  . CARDIAC CATHETERIZATION Bilateral 01/25/2018  . CARDIOVASCULAR STRESS TEST  04-23-2016   dr berry   normal nuclear study w/ no ischemia/  normal LV function and wall function, nuclear stress ef 57%  . CATARACT EXTRACTION W/ INTRAOCULAR LENS  IMPLANT, BILATERAL Bilateral   . COLONOSCOPY    . ESOPHAGEAL MANOMETRY N/A 10/12/2017   Procedure: ESOPHAGEAL MANOMETRY (EM);  Surgeon: [name], MD;  Location: [facility];  Service: Gastroenterology;  Laterality: N/A;  . ESOPHAGOGASTRODUODENOSCOPY N/A 10/12/2017   Procedure: ESOPHAGOGASTRODUODENOSCOPY (EGD);  Surgeon: [name], MD;  Location: [facility];  Service: Gastroenterology;  Laterality: N/A;  . ESOPHAGOGASTRODUODENOSCOPY (EGD) WITH ESOPHAGEAL DILATION  last one 11-09-2015  dr Yolanda Bonine   dilation schatzki's ring  . ESOPHAGOGASTRODUODENOSCOPY (EGD) WITH PROPOFOL N/A 11/17/2018    Procedure: ESOPHAGOGASTRODUODENOSCOPY;  Surgeon: [name], MD;  [facility]: [facility];  Service: Endoscopy;  Laterality: N/A;  . FOREIGN BODY REMOVAL  11/17/2018   Procedure: FOREIGN BODY REMOVAL;  Surgeon: [name], MD;  Location: [facility];  Service: Endoscopy;;  . GOLD SEED IMPLANT N/A 04/29/2017   Procedure: GOLD SEED IMPLANT;  Surgeon: [name], MD;  Location: [facility];  Service: Urology;  Laterality: N/A;  . HERNIA REPAIR    . INGUINAL HERNIA REPAIR Right 02/23/2014   Procedure: HERNIA REPAIR INGUINAL ADULT;  Surgeon: Joyice Faster. Cornett, MD;  Location: [place];  Service: General;  Laterality: Right;  . INSERTION OF MESH Right 02/23/2014   Procedure: INSERTION OF MESH;  Surgeon: Joyice Faster. Cornett, MD;  Location: [place];  Service: General;  Laterality: Right;  . PERIPHERAL VASCULAR CATHETERIZATION N/A 05/29/2016   Procedure: Lower Extremity Angiography;  Surgeon: [name], MD;  Location: [place] [facility];  Service: Cardiovascular;  Laterality: N/A; Diamondback orbital rotational atherectomy, PTA and stenting bilateral common iliac arteries   . PILONIDAL CYST / SINUS EXCISION  2000  . PROSTATE BIOPSY  09-22-2016  dr [name] office  . RIGHT/LEFT HEART CATH AND CORONARY ANGIOGRAPHY N/A 01/25/2018   Procedure: RIGHT/LEFT HEART CATH AND CORONARY ANGIOGRAPHY;  Surgeon: [name], MD;  Location: [place] [facility];  Service: Cardiovascular;  Laterality: N/A;  . SAVORY DILATION N/A 10/12/2017   Procedure: SAVORY DILATION;  Surgeon: [name], MD;  [facility];  Service: Gastroenterology;  Laterality: N/A;  . SAVORY DILATION N/A 11/17/2018   Procedure: SAVORY DILATION;  Surgeon: [name], MD;  [facility];  Service: Endoscopy;  Laterality: N/A;  . SPACE OAR INSTILLATION N/A 04/29/2017   Procedure: SPACE OAR INSTILLATION;  Surgeon: [name], MD;  [facility];  Service: Urology;  Laterality: N/A;  . TRANSCATHETER AORTIC VALVE REPLACEMENT, TRANSAPICAL Left 03/16/2018   Procedure: TRANSCATHETER AORTIC VALVE REPLACEMENT, TRANSAPICAL;  Surgeon: Sherren Mocha, MD;  Location: [place];  Service: Open Heart Surgery;  Laterality: Left;  . TRANSTHORACIC ECHOCARDIOGRAM  05-05-2016   dr berry   ef 0000000,  grade 1diastolic dysfunction/  moderate AV stenosis with mod. AR (right coronary and noncoronary cusp mobility seversly restricted);  valve area 1.25cm^2, mean gradient 27 mmHg, peak gradient 68mmHg/  mild MR  . UPPER GASTROINTESTINAL ENDOSCOPY       Current Outpatient Medications  Medication Sig Dispense Refill  . albuterol (PROVENTIL HFA;VENTOLIN HFA) 108 (90 Base) MCG/ACT inhaler Inhale 1 puff into the lungs every 6 (six) hours as needed for wheezing or shortness of breath.    [name] aspirin EC 81 MG tablet Take 81 mg by mouth daily.    . Cholecalciferol (VITAMIN D-3) 5000 units TABS Take 5,000 Units by mouth daily at 3 pm.    . feeding supplement, ENSURE ENLIVE, (ENSURE ENLIVE) LIQD Take 237 mLs by mouth 5 (five) times daily.     . fluticasone (FLONASE) 50 MCG/ACT nasal spray Place 2 sprays into both nostrils daily as needed for allergies or rhinitis.     [name]  levothyroxine (SYNTHROID, LEVOTHROID) 50 MCG tablet TAKE 1 TABLET BY MOUTH ONCE DAILY (Patient taking differently: Take 50 mcg by mouth daily before breakfast. ) 90 tablet 3  . omeprazole (PRILOSEC) 40 MG capsule Take 1 capsule by mouth twice daily 180 capsule 0   No current facility-administered medications for this visit.    Allergies:   Penicillins, Sulfa antibiotics, and Versed [midazolam]   Social History:  The patient  reports that he quit smoking about 13 years ago. His smoking use included cigarettes. He has a 50.00 pack-year smoking history. He has never used smokeless tobacco. He reports that he does not drink alcohol or use drugs.   Family History:  The patient's  family history includes Asthma in his father; Heart attack in his mother; Stroke in his father.    ROS:  Please see the history of present illness.   Otherwise, review of systems is positive for none.   All other systems are reviewed and negative.    PHYSICAL EXAM: VS:  BP 124/70   Pulse 68   Ht 5\' 5"  (1.651 m)   Wt 121 lb (54.9 kg)   SpO2 98%   BMI 20.14 kg/m  , BMI Body mass index is 20.14 kg/m. GEN: Well nourished, well developed, in no acute distress  HEENT: normal  Neck: no JVD, carotid bruits, or masses Cardiac: RRR; no murmurs, rubs, or gallops,no edema  Respiratory:  clear to auscultation bilaterally, normal work of breathing GI: soft, nontender, nondistended, + BS MS: no deformity or atrophy  Skin: warm and dry Neuro:  Strength and sensation are intact Psych: euthymic mood, full affect  EKG:  EKG is not ordered today. Personal review of the ekg ordered 05/24/19 shows sinus rhythm, first-degree AV block, left bundle branch block, rate 60  Recent Labs: 12/09/2018: ALT 9; BUN 19; Creat 0.74; Hemoglobin 11.9; Magnesium 2.1; Platelets 132; Potassium 5.3; Sodium 144; TSH 2.32    Lipid Panel     Component Value Date/Time   CHOL 150 12/09/2018 1533   TRIG 119 12/09/2018 1533   HDL 54 12/09/2018 1533   CHOLHDL 2.8 12/09/2018 1533   VLDL 19 09/15/2016 1729   LDLCALC 75 12/09/2018 1533     Wt Readings from Last 3 Encounters:  06/16/19 121 lb (54.9 kg)  05/24/19 123 lb 9.6 oz (56.1 kg)  12/09/18 121 lb 6.4 oz (55.1 kg)      Other studies Reviewed: Additional studies/ records that were reviewed today include: TTE 03/02/19  Review of the above records today demonstrates:   1. - TAVR: There is a 26 mm S2 in aortic position. The Vmax is 2.2 m/s. The peak/mean gradients are 15/9 mmHg. Mild to moderate paravalvular leak is detected in the 3 to 5 o'clock position. The degree of paravalvular leak appears simialr to the prior  echo (04/21/2018). Consider TEE for better  quantification of regurgitation if clinically indicated.  2. The left ventricle has normal systolic function with an ejection fraction of 60-65%. The cavity size was normal. There is mild concentric left ventricular hypertrophy. Left ventricular diastolic Doppler parameters are consistent with impaired  relaxation.  3. The right ventricle has normal systolic function. The cavity was normal. There is no increase in right ventricular wall thickness. Right ventricular systolic pressure is normal.  4. Mildly thickened tricuspid valve leaflets.  5. The mitral valve is degenerative. Mild thickening of the mitral valve leaflet. Mild calcification of the mitral valve leaflet.  6. The tricuspid valve is grossly normal.  7. A 26 Edwards Sapien bioprosthetic aortic valve (TAVR) valve is present in the aortic position. Procedure Date: 03/16/2018 Echo findings are consistent with perivalvular leak of the aortic prosthesis.  8. Aortic valve regurgitation is mild to moderate by color flow Doppler.  9. The aorta is normal unless otherwise noted. 10. The aortic root is normal in size and structure. 11. When compared to the prior study: No significant change from prior (04/21/2018).  Monitor 04/25/19 personally reviewed 1. SR/SB/ST 2. Occasional PVCs 3. HR as low as the 30s with 3 sec pauses. 4. Needs ROV to discuss   ASSESSMENT AND PLAN:  1.  Paroxysmal atrial fibrillation: Currently not anticoagulated.  His atrial fibrillation was around the time of his TAVR and none has recurred.  CHA2DS2-VASc of at least 4.  2.  Sinus bradycardia: Patient has weakness and fatigue on no chronotropic medications.  He wore a cardiac monitor, but did not have any episodes of weakness, fatigue, or dizziness while wearing the monitor.  Due to the fact that he also has paroxysmal atrial fibrillation, I would prefer to have a firm diagnosis for his symptoms prior to either pacemaker implant or further therapy for his atrial  fibrillation.  We Frady Taddeo thus plan for a 30-day monitor.  3.  Peripheral arterial disease: Multiple stents placed by Dr. Alvester Chou.  Plan per primary cardiology.  4.  Aortic stenosis: Status post TAVR.  5.  Hypertension: Currently well controlled  6.  Hyperlipidemia: Plan per primary cardiology.  Plan discussed with primary cardiology  Current medicines are reviewed at length with the patient today.   The patient does not have concerns regarding his medicines.  The following changes were made today:  none  Labs/ tests ordered today include:  Orders Placed This Encounter  Procedures  . Cardiac event monitor     Disposition:   FU with Shahin Knierim pending cardiac monitor  Signed, Amarrah Meinhart [name], MD  06/16/2019 4:17 PM     [facility] [place] [id number] [phone] (office) [phone] (fax)

## 2019-06-16 NOTE — Patient Instructions (Signed)
Medication Instructions:  Your physician recommends that you continue on your current medications as directed. Please refer to the Current Medication list given to you today.  * If you need a refill on your cardiac medications before your next appointment, please call your pharmacy.   Labwork: None ordered  Testing/Procedures: Your physician has recommended that you wear an event monitor. Event monitors are medical devices that record the heart's electrical activity. Doctors most often [place] these monitors to diagnose arrhythmias. Arrhythmias are problems with the speed or rhythm of the heartbeat. The monitor is a small, portable device. You can wear one while you do your normal daily activities. This is usually used to diagnose what is causing palpitations/syncope (passing out).  Follow-Up: To be determined after monitor is completed and reviewed   Thank you for choosing [facility]!!   Trinidad Curet, RN [phone]  Any Other Special Instructions Will Be Listed Below (If Applicable).   Ambulatory Cardiac Monitoring An ambulatory cardiac monitor is a small recording device that is used to detect abnormal heart rhythms (arrhythmias). Most monitors are connected by wires to flat, sticky disks (electrodes) that are then attached to your chest. You may need to wear a monitor if you have had symptoms such as:  Fast heartbeats (palpitations).  Dizziness.  Fainting or light-headedness.  Unexplained weakness.  Shortness of breath. There are several types of monitors. Some common monitors include:  Holter monitor. This records your heart rhythm continuously, usually for 24-48 hours.  Event (episodic) monitor. This monitor has a symptoms button, and when pushed, it will begin recording. You need to activate this monitor to record when you have a heart-related symptom.  Automatic detection monitor. This monitor will begin recording when it detects an abnormal heartbeat. What are  the risks? Generally, these devices are safe to use. However, it is possible that the skin under the electrodes will become irritated. How to prepare for monitoring Your health care provider will prepare your chest for the electrode placement and show you how to use the monitor.  Do not apply lotions to your chest before monitoring.  Follow directions on how to care for the monitor, and how to return the monitor when the testing period is complete. How to use your cardiac monitor  Follow directions about how long to wear the monitor, and if you can take the monitor off in order to shower or bathe. ? Do not let the monitor get wet. ? Do not bathe, swim, or use a hot tub while wearing the monitor.  Keep your skin clean. Do not put body lotion or moisturizer on your chest.  Change the electrodes as told by your health care provider, or any time they stop sticking to your skin. You may need to use medical tape to keep them on.  Try to put the electrodes in slightly different places on your chest to help prevent skin irritation. Follow directions from your health care provider about where to place the electrodes.  Make sure the monitor is safely clipped to your clothing or in a location close to your body as recommended by your health care provider.  If your monitor has a symptoms button, press the button to mark an event as soon as you feel a heart-related symptom, such as: ? Dizziness. ? Weakness. ? Light-headedness. ? Palpitations. ? Thumping or pounding in your chest. ? Shortness of breath. ? Unexplained weakness.  Keep a diary of your activities, such as walking, doing chores, and taking medicine.  It is very important to note what you were doing when you pushed the button to record your symptoms. This will help your health care provider determine what might be contributing to your symptoms.  Send the recorded information as recommended by your health care provider. It may take some  time for your health care provider to process the results.  Change the batteries as told by your health care provider.  Keep electronic devices away from your monitor. These include: ? Tablets. ? MP3 players. ? Cell phones.  While wearing your monitor you should avoid: ? Electric blankets. ? Armed forces operational officer. ? Electric toothbrushes. ? Microwave ovens. ? Magnets. ? Metal detectors. Get help right away if:  You have chest pain.  You have shortness of breath or extreme difficulty breathing.  You develop a very fast heartbeat that does not get better.  You develop dizziness that does not go away.  You faint or constantly feel like you are about to faint. Summary  An ambulatory cardiac monitor is a small recording device that is used to detect abnormal heart rhythms (arrhythmias).  Make sure you understand how to send the information from the monitor to your health care provider.  It is important to press the button on the monitor when you have any heart-related symptoms.  Keep a diary of your activities, such as walking, doing chores, and taking medicine. It is very important to note what you were doing when you pushed the button to record your symptoms. This will help your health care provider learn what might be causing your symptoms. This information is not intended to replace advice given to you by your health care provider. Make sure you discuss any questions you have with your health care provider. Document Released: 03/25/2008 Document Revised: 05/29/2017 Document Reviewed: 05/31/2016 Elsevier Patient Education  2020 [employer].

## 2019-06-17 ENCOUNTER — Encounter: Payer: Medicare Other | Admitting: Internal Medicine

## 2019-06-17 ENCOUNTER — Telehealth: Payer: Self-pay | Admitting: Radiology

## 2019-06-17 NOTE — Telephone Encounter (Signed)
Enrolled patient for a 30 day [employer] Event Monitor to be mailed to patients home  

## 2019-06-20 ENCOUNTER — Other Ambulatory Visit: Payer: Self-pay

## 2019-06-20 ENCOUNTER — Ambulatory Visit (HOSPITAL_BASED_OUTPATIENT_CLINIC_OR_DEPARTMENT_OTHER)
Admission: RE | Admit: 2019-06-20 | Discharge: 2019-06-20 | Disposition: A | Discharge status: Home / Self Care | Payer: Medicare Other | Source: Ambulatory Visit | Attending: Cardiovascular Disease | Admitting: Cardiovascular Disease

## 2019-06-20 ENCOUNTER — Ambulatory Visit (HOSPITAL_COMMUNITY)
Admission: RE | Admit: 2019-06-20 | Discharge: 2019-06-20 | Disposition: A | Discharge status: Home / Self Care | Payer: Medicare Other | Source: Ambulatory Visit | Attending: Cardiology | Admitting: Cardiology

## 2019-06-20 ENCOUNTER — Other Ambulatory Visit (HOSPITAL_COMMUNITY): Payer: Self-pay | Admitting: Cardiovascular Disease

## 2019-06-20 DIAGNOSIS — I739 Peripheral vascular disease, unspecified: Secondary | ICD-10-CM

## 2019-06-20 DIAGNOSIS — Z95828 Presence of other vascular implants and grafts: Secondary | ICD-10-CM

## 2019-06-20 DIAGNOSIS — C61 Malignant neoplasm of prostate: Secondary | ICD-10-CM | POA: Diagnosis not present

## 2019-06-26 ENCOUNTER — Other Ambulatory Visit: Payer: Self-pay | Admitting: Internal Medicine

## 2019-06-26 DIAGNOSIS — E039 Hypothyroidism, unspecified: Secondary | ICD-10-CM

## 2019-06-26 MED ORDER — LEVOTHYROXINE SODIUM 50 MCG PO TABS: ORAL_TABLET | ORAL | 3 refills | Status: DC

## 2019-06-27 ENCOUNTER — Telehealth: Payer: Self-pay | Admitting: Cardiology

## 2019-06-27 DIAGNOSIS — I480 Paroxysmal atrial fibrillation: Secondary | ICD-10-CM

## 2019-06-27 NOTE — Telephone Encounter (Signed)
According to daughter, [name], patient was sent a heart monitor but she believes that this is too difficult for him and her mother to figure out. States there is not someone who can go over to help them as often as they would need it. She would like to know if there are any alternatives to having this device.

## 2019-06-27 NOTE — Telephone Encounter (Signed)
Spoke with pt daughter, they received the [employer] 30 day event monitor and they do not feel that pt is capable of wearing the monitor.  Discussed differences in Zio patch and [employer] monitor.  Pt was able to complete 2 week with Zio patch but this looks too complicated for pt with confusion.  Daughter indicates that she is sending the monitor back without using it.

## 2019-06-28 ENCOUNTER — Telehealth: Payer: Self-pay | Admitting: Radiology

## 2019-06-28 NOTE — Addendum Note (Signed)
Addended by: Trena Platt E on: 06/28/2019 10:07 AM   Modules accepted: Orders

## 2019-06-28 NOTE — Telephone Encounter (Signed)
If insurance Hiep Ollis agree, plan for second 14 day monitor.

## 2019-06-28 NOTE — Telephone Encounter (Signed)
Enrolled patient for a 14 day Zio monitor to be mailed to patients home.  

## 2019-06-28 NOTE — Telephone Encounter (Signed)
Left detailed message for pt daughter, [name] advising new 14 day zio is being ordered, if approved by insurance MD would like pt to complete another 14 days.

## 2019-07-06 ENCOUNTER — Other Ambulatory Visit (INDEPENDENT_AMBULATORY_CARE_PROVIDER_SITE_OTHER): Payer: Medicare Other

## 2019-07-06 DIAGNOSIS — I480 Paroxysmal atrial fibrillation: Secondary | ICD-10-CM | POA: Diagnosis not present

## 2019-07-11 ENCOUNTER — Encounter: Payer: Self-pay | Admitting: Internal Medicine

## 2019-07-11 ENCOUNTER — Ambulatory Visit (INDEPENDENT_AMBULATORY_CARE_PROVIDER_SITE_OTHER): Payer: Medicare Other | Admitting: Internal Medicine

## 2019-07-11 ENCOUNTER — Other Ambulatory Visit: Payer: Self-pay

## 2019-07-11 VITALS — BP 112/60 | HR 68 | Temp 97.2°F | Resp 16 | Ht 66.0 in | Wt 121.8 lb

## 2019-07-11 DIAGNOSIS — Z79899 Other long term (current) drug therapy: Secondary | ICD-10-CM

## 2019-07-11 DIAGNOSIS — E559 Vitamin D deficiency, unspecified: Secondary | ICD-10-CM | POA: Diagnosis not present

## 2019-07-11 DIAGNOSIS — Z1211 Encounter for screening for malignant neoplasm of colon: Secondary | ICD-10-CM

## 2019-07-11 DIAGNOSIS — I480 Paroxysmal atrial fibrillation: Secondary | ICD-10-CM

## 2019-07-11 DIAGNOSIS — I739 Peripheral vascular disease, unspecified: Secondary | ICD-10-CM

## 2019-07-11 DIAGNOSIS — R7309 Other abnormal glucose: Secondary | ICD-10-CM | POA: Diagnosis not present

## 2019-07-11 DIAGNOSIS — N401 Enlarged prostate with lower urinary tract symptoms: Secondary | ICD-10-CM | POA: Diagnosis not present

## 2019-07-11 DIAGNOSIS — Z1212 Encounter for screening for malignant neoplasm of rectum: Secondary | ICD-10-CM

## 2019-07-11 DIAGNOSIS — I10 Essential (primary) hypertension: Secondary | ICD-10-CM

## 2019-07-11 DIAGNOSIS — Z125 Encounter for screening for malignant neoplasm of prostate: Secondary | ICD-10-CM

## 2019-07-11 DIAGNOSIS — E039 Hypothyroidism, unspecified: Secondary | ICD-10-CM | POA: Diagnosis not present

## 2019-07-11 DIAGNOSIS — I700 Atherosclerosis of aorta: Secondary | ICD-10-CM

## 2019-07-11 DIAGNOSIS — Z136 Encounter for screening for cardiovascular disorders: Secondary | ICD-10-CM

## 2019-07-11 DIAGNOSIS — Z8249 Family history of ischemic heart disease and other diseases of the circulatory system: Secondary | ICD-10-CM

## 2019-07-11 DIAGNOSIS — E782 Mixed hyperlipidemia: Secondary | ICD-10-CM

## 2019-07-11 DIAGNOSIS — Z87891 Personal history of nicotine dependence: Secondary | ICD-10-CM

## 2019-07-11 DIAGNOSIS — N138 Other obstructive and reflux uropathy: Secondary | ICD-10-CM

## 2019-07-11 NOTE — Patient Instructions (Signed)

## 2019-07-11 NOTE — Progress Notes (Signed)
Annual  Screening/Preventative Visit  & Comprehensive Evaluation & Examination     This very nice 79 y.o. MWM  presents for a Screening /Preventative Visit & comprehensive evaluation and management of multiple medical co-morbidities.  Patient has been followed for Hypertension, ASPAD,  aortic stenosis status post TAVR,  Hyperlipidemia, Pre-Diabetes, GERD, Hypothyroidism, hx/o Prostate Ca,  and Vitamin D Deficiency. Patient has hx/o OSA but is not on CPAP due to mask intolerance. Patient has hx/o a cavitary LUL lung lesion followed by Dr [name] with lesion resolving & felt due to infectious process. Patient has hx/o GERD& Achalasia & esophageal stricture dilated by Dr Silverio Decamp.      HTN predates since 1995. Patient's BP has been controlled at home.  Today's BP is at goal - 112/60.  Patient has been followed by Dr [name] for ASPVD & had PCA, arthrotomies & stenting of his LE's. Stress Myoview was reported Non-ischemic.  Patient also has a  Lt Renal Aa stenosis and Mesenteric Aa.   Patient had TAVR by Dr's Burt Knack & Bartle. Patient has had po pAfib and is followed by Dr [name] for this also. Patient denies any cardiac symptoms as chest pain, palpitations, shortness of breath, dizziness or ankle swelling.     Patient's hyperlipidemia is controlled with diet and medications. Patient denies myalgias or other medication SE's. Last lipids were at goal:  Lab Results  Component Value Date   CHOL 150 12/09/2018   HDL 54 12/09/2018   LDLCALC 75 12/09/2018   TRIG 119 12/09/2018   CHOLHDL 2.8 12/09/2018      Patient has hx/o prediabetes  (A1c 6.1% / 2002)  and patient denies reactive hypoglycemic symptoms, visual blurring, diabetic polys or paresthesias. Last A1c was Normal & at goal:  Lab Results  Component Value Date   HGBA1C 4.8 12/09/2018          Also, patient has been on Thyroid replacement since 2013.     Finally, patient has history of Vitamin D Deficiency of    and last vitamin D was at goal:   Lab Results  Component Value Date   VD25OH 63 12/09/2018   Current Outpatient Medications on File Prior to Visit  Medication Sig  . albuterol (PROVENTIL HFA;VENTOLIN HFA) 108 (90 Base) MCG/ACT inhaler Inhale 1 puff into the lungs every 6 (six) hours as needed for wheezing or shortness of breath.  [name] aspirin EC 81 MG tablet Take 81 mg by mouth daily.  . Cholecalciferol (VITAMIN D-3) 5000 units TABS Take 5,000 Units by mouth daily at 3 pm.  . feeding supplement, ENSURE ENLIVE, (ENSURE ENLIVE) LIQD Take 237 mLs by mouth 5 (five) times daily.   . fluticasone (FLONASE) 50 MCG/ACT nasal spray Place 2 sprays into both nostrils daily as needed for allergies or rhinitis.   [name] levothyroxine (SYNTHROID) 50 MCG tablet Take 1 tablet daily on an empty stomach with only water for 30 minutes & no Antacid meds, Calcium or Magnesium for 4 hours & avoid Biotin  . omeprazole (PRILOSEC) 40 MG capsule Take 1 capsule by mouth twice daily   No current facility-administered medications on file prior to visit.   Allergies  Allergen Reactions  . Penicillins Anaphylaxis, Itching, Swelling and Other   . Sulfa Antibiotics Anaphylaxis and Swelling  . Versed [Midazolam] Other (See Comments)    Confusion, delerium   Past Medical History:  Diagnosis Date  . AAA (abdominal aortic aneurysm) ([place])    no longer monitoring was small area  . Achalasia   .  Carotid stenosis, asymptomatic, bilateral    per duplex --- bilateral <50% ICA  . COPD (chronic obstructive pulmonary disease) ([place])    pulmologist-  dr [name]  . Diabetes mellitus without complication (HCC)    diet controlled  . GERD (gastroesophageal reflux disease)   . Hyperlipidemia   . Hypertension   . Hypothyroidism   . Lesion of left lung    followed by dr [name] (pulmologist)  post infection scarring  . Mild dementia ([place])   . OSA (obstructive sleep apnea)    has a cpap-  intolerant; due to Claustaphobia (01/25/2018)  . Peripheral vascular disease [facility])     cardiologist-- dr berry-- per last ABI/Aorta/Iliac 06-09-2016  patent bilateral iliac kissing stents, >50% stenosis bilateral iliac artery (R>L) and left external iliac artery, patent IVC  . Prostate cancer [facility])    urologist-  dr [name] oncologist-  dr [name]. Dx'd 2018 s/p XRT  . RLS (restless legs syndrome)   . S/P TAVR (transcatheter aortic valve replacement) 02/2018   s/p TAVR via transapical approach with an Edwards Sapien 3 THV (size 26 mm, model # 9600TFX, serial # [id number])  . Schatzki's ring   . Severe aortic stenosis    Health Maintenance  Topic Date Due  . PNA vac Low Risk Adult (1 of 2 - PCV13) 02/22/2005  . INFLUENZA VACCINE  01/29/2019  . TETANUS/TDAP  09/23/2023   Immunization History  Administered Date(s) Administered  . DT (Pediatric) 09/22/2013  . Influenza, High Dose Seasonal PF 04/11/2014, 04/20/2015  . Zoster 06/30/2005   Last Colon -  Past Surgical History:  Procedure Laterality Date  . ABDOMINAL AORTOGRAM N/A 01/25/2018   Procedure: ABDOMINAL AORTOGRAM;  Surgeon: [name], MD;  Location: [place] [facility];  Service: Cardiovascular;  Laterality: N/A;  . ANAL FISTULECTOMY  2010  . BALLOON DILATION N/A 11/17/2018   Procedure: BALLOON DILATION;  Surgeon: [name], MD;  Location: [facility];  Service: Endoscopy;  Laterality: N/A;  . CARDIAC CATHETERIZATION Bilateral 01/25/2018  . CARDIOVASCULAR STRESS TEST  04-23-2016   dr berry   normal nuclear study w/ no ischemia/  normal LV function and wall function, nuclear stress ef 57%  . CATARACT EXTRACTION W/ INTRAOCULAR LENS  IMPLANT, BILATERAL Bilateral   . COLONOSCOPY    . ESOPHAGEAL MANOMETRY N/A 10/12/2017   Procedure: ESOPHAGEAL MANOMETRY (EM);  Surgeon: [name], MD;  Location: [facility];  Service: Gastroenterology;  Laterality: N/A;  . ESOPHAGOGASTRODUODENOSCOPY N/A 10/12/2017   Procedure: ESOPHAGOGASTRODUODENOSCOPY (EGD);  Surgeon: [name], MD;  Location: [facility];  Service: Gastroenterology;  Laterality: N/A;  . ESOPHAGOGASTRODUODENOSCOPY (EGD) WITH ESOPHAGEAL DILATION  last one 11-09-2015  dr Yolanda Bonine   dilation schatzki's ring  . ESOPHAGOGASTRODUODENOSCOPY (EGD) WITH PROPOFOL N/A 11/17/2018   Procedure: ESOPHAGOGASTRODUODENOSCOPY;  Surgeon: [name], MD;  [facility]: [facility];  Service: Endoscopy;  Laterality: N/A;  . FOREIGN BODY REMOVAL  11/17/2018   Procedure: FOREIGN BODY REMOVAL;  Surgeon: [name], MD;  Location: [facility];  Service: Endoscopy;;  . GOLD SEED IMPLANT N/A 04/29/2017   Procedure: GOLD SEED IMPLANT;  Surgeon: [name], MD;  Location: [facility];  Service: Urology;  Laterality: N/A;  . HERNIA REPAIR    . INGUINAL HERNIA REPAIR Right 02/23/2014   Procedure: HERNIA REPAIR INGUINAL ADULT;  Surgeon: Joyice Faster. Cornett, MD;  Location: [place];  Service: General;  Laterality: Right;  . INSERTION OF MESH Right 02/23/2014   Procedure: INSERTION OF  MESH;  Surgeon: Joyice Faster. Cornett, MD;  Location: [place];  Service: General;  Laterality: Right;  . PERIPHERAL VASCULAR CATHETERIZATION N/A 05/29/2016   Procedure: Lower Extremity Angiography;  Surgeon: [name], MD;  Location: [place] [facility];  Service: Cardiovascular;  Laterality: N/A; [place] orbital rotational atherectomy, PTA and stenting bilateral common iliac arteries   . PILONIDAL CYST / SINUS EXCISION  2000  . PROSTATE BIOPSY  09-22-2016  dr [name] office  . RIGHT/LEFT HEART CATH AND CORONARY ANGIOGRAPHY N/A 01/25/2018   Procedure: RIGHT/LEFT HEART CATH AND CORONARY ANGIOGRAPHY;  Surgeon: [name], MD;  Location: [place] [facility];  Service: Cardiovascular;  Laterality: N/A;  . SAVORY DILATION N/A 10/12/2017   Procedure: SAVORY DILATION;  Surgeon: [name], MD;  [facility];  Service: Gastroenterology;  Laterality: N/A;  . SAVORY DILATION N/A 11/17/2018    Procedure: SAVORY DILATION;  Surgeon: [name], MD;  [facility];  Service: Endoscopy;  Laterality: N/A;  . SPACE OAR INSTILLATION N/A 04/29/2017   Procedure: SPACE OAR INSTILLATION;  Surgeon: [name], MD;  Location[facility];  Service: Urology;  Laterality: N/A;  . TRANSCATHETER AORTIC VALVE REPLACEMENT, TRANSAPICAL Left 03/16/2018   Procedure: TRANSCATHETER AORTIC VALVE REPLACEMENT, TRANSAPICAL;  Surgeon: Sherren Mocha, MD;  Location: [place];  Service: Open Heart Surgery;  Laterality: Left;  . TRANSTHORACIC ECHOCARDIOGRAM  05-05-2016   dr berry   ef 0000000,  grade 1diastolic dysfunction/  moderate AV stenosis with mod. AR (right coronary and noncoronary cusp mobility seversly restricted);  valve area 1.25cm^2, mean gradient 27 mmHg, peak gradient 21mmHg/  mild MR  . UPPER GASTROINTESTINAL ENDOSCOPY     Family History  Problem Relation Age of Onset  . Heart attack Mother   . Stroke Father   . Asthma Father   . Cancer Neg Hx   . Colon cancer Neg Hx   . Stomach cancer Neg Hx   . Esophageal cancer Neg Hx   . Rectal cancer Neg Hx   . Pancreatic cancer Neg Hx   . Prostate cancer Neg Hx    Social History   Socioeconomic History  . Marital status: Married    Spouse name: [name]  . Number of children: 2 daughters  Occupational History  . Not on file  Tobacco Use  . Smoking status: Former Smoker    Packs/day: 1.00    Years: 50.00    Pack years: 50.00    Types: Cigarettes    Quit date: 07/05/2005    Years since quitting: 14.0  . Smokeless tobacco: Never Used  Substance and Sexual Activity  . Alcohol use: No  . Drug use: Never  . Sexual activity: Not Currently     ROS Constitutional: Denies fever, chills, weight loss/gain, headaches, insomnia,  night sweats or change in appetite. Does c/o fatigue. Eyes: Denies redness, blurred vision, diplopia, discharge, itchy or watery eyes.  ENT: Denies discharge, congestion, post nasal drip,  epistaxis, sore throat, earache, hearing loss, dental pain, Tinnitus, Vertigo, Sinus pain or snoring.  Cardio: Denies chest pain, palpitations, irregular heartbeat, syncope, dyspnea, diaphoresis, orthopnea, PND, claudication or edema Respiratory: denies cough, dyspnea, DOE, pleurisy, hoarseness, laryngitis or wheezing.  Gastrointestinal: Denies dysphagia, heartburn, reflux, water brash, pain, cramps, nausea, vomiting, bloating, diarrhea, constipation, hematemesis, melena, hematochezia, jaundice or hemorrhoids Genitourinary: Denies dysuria, frequency, discharge, hematuria or flank pain. Has urgency, nocturia x 2-3 & occasional hesitancy. Musculoskeletal: Denies arthralgia, myalgia, stiffness, Jt. Swelling, pain, limp or  strain/sprain. Denies Falls. Skin: Denies puritis, rash, hives, warts, acne, eczema or change in skin lesion Neuro: No weakness, tremor, incoordination, spasms, paresthesia or pain Psychiatric: Denies confusion, memory loss or sensory loss. Denies Depression. Endocrine: Denies change in weight, skin, hair change, nocturia, and paresthesia, diabetic polys, visual blurring or hyper / hypo glycemic episodes.  Heme/Lymph: No excessive bleeding, bruising or enlarged lymph nodes.  Physical Exam  BP 112/60   Pulse 68   Temp (!) 97.2 F (36.2 C)   Resp 16   Ht 5\' 6"  (1.676 m)   Wt 121 lb 12.8 oz (55.2 kg)   BMI 19.66 kg/m   General Appearance: Well nourished and well groomed and in no apparent distress.  Eyes: PERRLA, EOMs, conjunctiva no swelling or erythema, normal fundi and vessels. Sinuses: No frontal/maxillary tenderness ENT/Mouth: EACs patent / TMs  nl. Nares clear without erythema, swelling, mucoid exudates. Oral hygiene is good. No erythema, swelling, or exudate. Tongue normal, non-obstructing. Tonsils not swollen or erythematous. Hearing normal.  Neck: Supple, thyroid not palpable. No bruits, nodes or JVD. Respiratory: Respiratory effort normal.  BS equal and clear  bilateral without rales, rhonci, wheezing or stridor. Cardio: Heart sounds are normal with regular rate and rhythm and no murmurs, rubs or gallops. Peripheral pulses are not palpable and without edema. No aortic or femoral bruits. Chest: symmetric with normal excursions and percussion.  Abdomen: Soft, with Nl bowel sounds. Nontender, no guarding, rebound, hernias, masses, or organomegaly.  Lymphatics: Non tender without lymphadenopathy.  Musculoskeletal: Full ROM all peripheral extremities, joint stability, 5/5 strength, and normal gait. Skin: Warm and dry without rashes, lesions, cyanosis, clubbing or  ecchymosis.  Neuro: Cranial nerves intact, reflexes equal bilaterally. Normal muscle tone, no cerebellar symptoms. Sensation intact.  Pysch: Alert and oriented X 3 with flat affect, and limited insight and judgment.   Assessment and Plan  1. Essential hypertension  - Urinalysis, Routine w reflex microscopic - Microalbumin / Creatinine Urine Ratio - CBC with Diff - COMPLETE METABOLIC PANEL WITH GFR - Magnesium - TSH  2. Hyperlipidemia, mixed  - Lipid Profile - TSH  3. Abnormal glucose  - Hemoglobin A1c ([facility]) - Insulin, random  4. Vitamin D deficiency  - Vitamin D (25 hydroxy)  5. PVD (peripheral vascular disease) ([place])  - Lipid Profile  6. Hypothyroidism, unspecified type  - TSH  7. Paroxysmal atrial fibrillation (HCC)   8. BPH with obstruction/lower urinary tract symptoms  - PSA  9. Screening for colorectal cancer  - POC Hemoccult Bld/Stl  10. Prostate cancer screening  - PSA  11. Screening for ischemic heart disease   12. FHx: heart disease   13. Former smoker   45. Atherosclerosis of aorta ([place])   15. Screening for AAA (aortic abdominal aneurysm)   16. Medication management  - Urinalysis, Routine w reflex microscopic - Microalbumin / Creatinine Urine Ratio - CBC with Diff - COMPLETE METABOLIC PANEL WITH GFR - Magnesium - Lipid  Profile - TSH - Hemoglobin A1c ([facility]) - Insulin, random - Vitamin D (25 hydroxy)        Patient was counseled in prudent diet, weight control to achieve/maintain BMI less than 25, BP monitoring, regular exercise and medications as discussed.  Discussed med effects and SE's. Routine screening labs and tests as requested with regular follow-up as recommended. Over 40 minutes of exam, counseling, chart review and high complex critical decision making was performed   Kirtland Bouchard, MD

## 2019-07-12 LAB — MICROALBUMIN / CREATININE URINE RATIO
Creatinine, Urine: 167 mg/dL (ref 20–320)
Microalb Creat Ratio: 11 mcg/mg creat (ref <30)
Microalb, Ur: 1.8 mg/dL

## 2019-07-12 LAB — LIPID PANEL
Cholesterol: 172 mg/dL (ref <200)
HDL: 64 mg/dL (ref >=40)
LDL Cholesterol (Calc): 92 mg/dL (calc)
Non-HDL Cholesterol (Calc): 108 mg/dL (calc) (ref <130)
Total CHOL/HDL Ratio: 2.7 (calc) (ref <5.0)
Triglycerides: 75 mg/dL (ref <150)

## 2019-07-12 LAB — COMPLETE METABOLIC PANEL WITH GFR
AG Ratio: 1.5 (calc) (ref 1.0–2.5)
ALT: 14 U/L (ref 9–46)
AST: 16 U/L (ref 10–35)
Albumin: 4.2 g/dL (ref 3.6–5.1)
Alkaline phosphatase (APISO): 83 U/L (ref 35–144)
BUN: 15 mg/dL (ref 7–25)
CO2: 29 mmol/L (ref 20–32)
Calcium: 10 mg/dL (ref 8.6–10.3)
Chloride: 104 mmol/L (ref 98–110)
Creat: 0.8 mg/dL (ref 0.70–1.18)
GFR, Est African American: 98 mL/min/{1.73_m2} (ref >=60)
GFR, Est Non African American: 85 mL/min/{1.73_m2} (ref >=60)
Globulin: 2.8 g/dL (calc) (ref 1.9–3.7)
Glucose, Bld: 139 mg/dL — ABNORMAL HIGH (ref 65–99)
Potassium: 4 mmol/L (ref 3.5–5.3)
Sodium: 142 mmol/L (ref 135–146)
Total Bilirubin: 0.7 mg/dL (ref 0.2–1.2)
Total Protein: 7 g/dL (ref 6.1–8.1)

## 2019-07-12 LAB — URINALYSIS, ROUTINE W REFLEX MICROSCOPIC
Bilirubin Urine: NEGATIVE
Glucose, UA: NEGATIVE
Hgb urine dipstick: NEGATIVE
Ketones, ur: NEGATIVE
Leukocytes,Ua: NEGATIVE
Nitrite: NEGATIVE
Protein, ur: NEGATIVE
Specific Gravity, Urine: 1.026 (ref 1.001–1.03)
pH: <=5 (ref 5.0–8.0)

## 2019-07-12 LAB — CBC WITH DIFFERENTIAL/PLATELET
Absolute Monocytes: 391 cells/uL (ref 200–950)
Basophils Absolute: 39 cells/uL (ref 0–200)
Basophils Relative: 0.7 %
Eosinophils Absolute: 187 cells/uL (ref 15–500)
Eosinophils Relative: 3.4 %
HCT: 37.6 % — ABNORMAL LOW (ref 38.5–50.0)
Hemoglobin: 13 g/dL — ABNORMAL LOW (ref 13.2–17.1)
Lymphs Abs: 858 cells/uL (ref 850–3900)
MCH: 33.2 pg — ABNORMAL HIGH (ref 27.0–33.0)
MCHC: 34.6 g/dL (ref 32.0–36.0)
MCV: 95.9 fL (ref 80.0–100.0)
MPV: 11 fL (ref 7.5–12.5)
Monocytes Relative: 7.1 %
Neutro Abs: 4026 cells/uL (ref 1500–7800)
Neutrophils Relative %: 73.2 %
Platelets: 144 10*3/uL (ref 140–400)
RBC: 3.92 10*6/uL — ABNORMAL LOW (ref 4.20–5.80)
RDW: 14.2 % (ref 11.0–15.0)
Total Lymphocyte: 15.6 %
WBC: 5.5 10*3/uL (ref 3.8–10.8)

## 2019-07-12 LAB — PSA: PSA: <0.1 ng/mL (ref <=4.0)

## 2019-07-12 LAB — INSULIN, RANDOM: Insulin: 10.8 u[IU]/mL

## 2019-07-12 LAB — HEMOGLOBIN A1C
Hgb A1c MFr Bld: 4.9 % of total Hgb (ref <5.7)
Mean Plasma Glucose: 94 (calc)
eAG (mmol/L): 5.2 (calc)

## 2019-07-12 LAB — TSH: TSH: 2.19 mIU/L (ref 0.40–4.50)

## 2019-07-12 LAB — MAGNESIUM: Magnesium: 2.1 mg/dL (ref 1.5–2.5)

## 2019-07-12 LAB — VITAMIN D 25 HYDROXY (VIT D DEFICIENCY, FRACTURES): Vit D, 25-Hydroxy: 37 ng/mL (ref 30–100)

## 2019-08-03 DIAGNOSIS — I480 Paroxysmal atrial fibrillation: Secondary | ICD-10-CM | POA: Diagnosis not present

## 2019-08-05 ENCOUNTER — Telehealth: Payer: Self-pay

## 2019-08-05 NOTE — Telephone Encounter (Signed)
The patient has been notified of the result and verbalized understanding.  All questions (if any) were answered. Wilma Flavin, RN 08/05/2019 2:00 PM

## 2019-08-05 NOTE — Telephone Encounter (Signed)
-----   Message from Will [name], MD sent at 08/04/2019  7:54 AM EST ----- Average HR 64, no atrial fibrillation noted. No obvious cause for fatigue.

## 2019-08-25 ENCOUNTER — Ambulatory Visit (INDEPENDENT_AMBULATORY_CARE_PROVIDER_SITE_OTHER): Payer: Medicare Other | Admitting: Gastroenterology

## 2019-08-25 ENCOUNTER — Encounter: Payer: Self-pay | Admitting: Gastroenterology

## 2019-08-25 VITALS — BP 114/62 | HR 64 | Temp 97.9°F | Ht 65.0 in | Wt 120.0 lb

## 2019-08-25 DIAGNOSIS — R1310 Dysphagia, unspecified: Secondary | ICD-10-CM

## 2019-08-25 DIAGNOSIS — K222 Esophageal obstruction: Secondary | ICD-10-CM

## 2019-08-25 DIAGNOSIS — R1319 Other dysphagia: Secondary | ICD-10-CM

## 2019-08-25 NOTE — Progress Notes (Addendum)
08/25/2019 Robert Schaefer [id number] 20-Mar-1941   HISTORY OF PRESENT ILLNESS: This is a 79 year old male who is a patient of Dr. Woodward Ku.  He was last seen for EGD in May 2020 for complaints of dysphagia.  Has history of high-grade esophageal stenosis and EGD at that time showed the following:  - Dilation in the entire esophagus. - Food in the esophagus. Removal was successful. - Diverticulum in the lower third of the esophagus. - Benign-appearing high grade esophageal stenosis. Dilated with savary after passing guidewire under fluoro to max 45mm followed by TTS balloon to 73mm. - Gastritis. - Normal examined duodenum.  He was supposed to have repeat EGD with dilation in 3 months, but for one reason or the other that never got performed.  He is here today with his wife with complaints of dysphagia.  Able to keep little to nothing down including water.  Is becoming very weak.  Just progressively been worsening over the past several months.  Past Medical History:  Diagnosis Date  . AAA (abdominal aortic aneurysm) ([place])    no longer monitoring was small area  . Achalasia   . Carotid stenosis, asymptomatic, bilateral    per duplex --- bilateral <50% ICA  . COPD (chronic obstructive pulmonary disease) ([place])    pulmologist-  dr [name]  . Diabetes mellitus without complication (HCC)    diet controlled  . GERD (gastroesophageal reflux disease)   . Hyperlipidemia   . Hypertension   . Hypothyroidism   . Lesion of left lung    followed by dr [name] (pulmologist)  post infection scarring  . Mild dementia ([place])   . OSA (obstructive sleep apnea)    has a cpap-  intolerant; due to Claustaphobia (01/25/2018)  . Peripheral vascular disease [facility])    cardiologist-- dr berry-- per last ABI/Aorta/Iliac 06-09-2016  patent bilateral iliac kissing stents, >50% stenosis bilateral iliac artery (R>L) and left external iliac artery, patent IVC  . Prostate cancer [facility])    urologist-  dr [name]  oncologist-  dr [name]. Dx'd 2018 s/p XRT  . RLS (restless legs syndrome)   . S/P TAVR (transcatheter aortic valve replacement) 02/2018   s/p TAVR via transapical approach with an Edwards Sapien 3 THV (size 26 mm, model # 9600TFX, serial # [id number])  . Schatzki's ring   . Severe aortic stenosis    Past Surgical History:  Procedure Laterality Date  . ABDOMINAL AORTOGRAM N/A 01/25/2018   Procedure: ABDOMINAL AORTOGRAM;  Surgeon: [name], MD;  Location: [place] [facility];  Service: Cardiovascular;  Laterality: N/A;  . ANAL FISTULECTOMY  2010  . BALLOON DILATION N/A 11/17/2018   Procedure: BALLOON DILATION;  Surgeon: [name], MD;  [facility];  Service: Endoscopy;  Laterality: N/A;  . CARDIAC CATHETERIZATION Bilateral 01/25/2018  . CARDIOVASCULAR STRESS TEST  04-23-2016   dr berry   normal nuclear study w/ no ischemia/  normal LV function and wall function, nuclear stress ef 57%  . CATARACT EXTRACTION W/ INTRAOCULAR LENS  IMPLANT, BILATERAL Bilateral   . COLONOSCOPY    . ESOPHAGEAL MANOMETRY N/A 10/12/2017   Procedure: ESOPHAGEAL MANOMETRY (EM);  Surgeon: [name], MD;  [facility];  Service: Gastroenterology;  Laterality: N/A;  . ESOPHAGOGASTRODUODENOSCOPY N/A 10/12/2017   Procedure: ESOPHAGOGASTRODUODENOSCOPY (EGD);  Surgeon: [name], MD;  Location: [facility];  Service: Gastroenterology;  Laterality: N/A;  . ESOPHAGOGASTRODUODENOSCOPY (EGD) WITH ESOPHAGEAL DILATION  last one 11-09-2015  dr Yolanda Bonine   dilation  schatzki's ring  . ESOPHAGOGASTRODUODENOSCOPY (EGD) WITH PROPOFOL N/A 11/17/2018   Procedure: ESOPHAGOGASTRODUODENOSCOPY;  Surgeon: [name], MD;  [facility];  Service: Endoscopy;  Laterality: N/A;  . FOREIGN BODY REMOVAL  11/17/2018   Procedure: FOREIGN BODY REMOVAL;  Surgeon: [name], MD;  [facility];  Service: Endoscopy;;  . GOLD SEED IMPLANT N/A 04/29/2017   Procedure:  GOLD SEED IMPLANT;  Surgeon: [name], MD;  Location: [facility];  Service: Urology;  Laterality: N/A;  . HERNIA REPAIR    . INGUINAL HERNIA REPAIR Right 02/23/2014   Procedure: HERNIA REPAIR INGUINAL ADULT;  Surgeon: Joyice Faster. Cornett, MD;  Location: [place];  Service: General;  Laterality: Right;  . INSERTION OF MESH Right 02/23/2014   Procedure: INSERTION OF MESH;  Surgeon: Joyice Faster. Cornett, MD;  Location: [place];  Service: General;  Laterality: Right;  . PERIPHERAL VASCULAR CATHETERIZATION N/A 05/29/2016   Procedure: Lower Extremity Angiography;  Surgeon: [name], MD;  Location: [place] [facility];  Service: Cardiovascular;  Laterality: N/A; Diamondback orbital rotational atherectomy, PTA and stenting bilateral common iliac arteries   . PILONIDAL CYST / SINUS EXCISION  2000  . PROSTATE BIOPSY  09-22-2016  dr [name] office  . RIGHT/LEFT HEART CATH AND CORONARY ANGIOGRAPHY N/A 01/25/2018   Procedure: RIGHT/LEFT HEART CATH AND CORONARY ANGIOGRAPHY;  Surgeon: [name], MD;  Location: [place] [facility];  Service: Cardiovascular;  Laterality: N/A;  . SAVORY DILATION N/A 10/12/2017   Procedure: SAVORY DILATION;  Surgeon: [name], MD;  [facility];  Service: Gastroenterology;  Laterality: N/A;  . SAVORY DILATION N/A 11/17/2018   Procedure: SAVORY DILATION;  Surgeon: [name], MD;  [facility];  Service: Endoscopy;  Laterality: N/A;  . SPACE OAR INSTILLATION N/A 04/29/2017   Procedure: SPACE OAR INSTILLATION;  Surgeon: [name], MD;  Location: [facility];  Service: Urology;  Laterality: N/A;  . TRANSCATHETER AORTIC VALVE REPLACEMENT, TRANSAPICAL Left 03/16/2018   Procedure: TRANSCATHETER AORTIC VALVE REPLACEMENT, TRANSAPICAL;  Surgeon: Sherren Mocha, MD;  Location: [place];  Service: Open Heart Surgery;  Laterality: Left;  . TRANSTHORACIC ECHOCARDIOGRAM   05-05-2016   dr berry   ef 0000000,  grade 1diastolic dysfunction/  moderate AV stenosis with mod. AR (right coronary and noncoronary cusp mobility seversly restricted);  valve area 1.25cm^2, mean gradient 27 mmHg, peak gradient 53mmHg/  mild MR  . UPPER GASTROINTESTINAL ENDOSCOPY      reports that he quit smoking about 14 years ago. His smoking use included cigarettes. He has a 50.00 pack-year smoking history. He has never used smokeless tobacco. He reports that he does not drink alcohol or use drugs. family history includes Asthma in his father; Heart attack in his mother; Stroke in his father. Allergies  Allergen Reactions  . Penicillins Anaphylaxis, Itching, Swelling and Other (See Comments)    Did it involve swelling of the face/tongue/throat, SOB, or low BP? Yes Did it involve sudden or severe rash/hives, skin peeling, or any reaction on the inside of your mouth or nose? No Did you need to seek medical attention at a hospital or doctor's office? Yes When did it last happen?Many years ago If all above answers are "NO", may proceed with cephalosporin use.   . Sulfa Antibiotics Anaphylaxis and Swelling  . Versed [Midazolam] Other (See Comments)    Confusion, delerium      Outpatient Encounter Medications as of 08/25/2019  Medication  Sig  . albuterol (PROVENTIL HFA;VENTOLIN HFA) 108 (90 Base) MCG/ACT inhaler Inhale 1 puff into the lungs every 6 (six) hours as needed for wheezing or shortness of breath.  [name] aspirin EC 81 MG tablet Take 81 mg by mouth daily.  . Cholecalciferol (VITAMIN D-3) 5000 units TABS Take 5,000 Units by mouth daily at 3 pm.  . feeding supplement, ENSURE ENLIVE, (ENSURE ENLIVE) LIQD Take 237 mLs by mouth 5 (five) times daily.   . fluticasone (FLONASE) 50 MCG/ACT nasal spray Place 2 sprays into both nostrils daily as needed for allergies or rhinitis.   [name] levothyroxine (SYNTHROID) 50 MCG tablet Take 1 tablet daily on an empty stomach with only water for 30 minutes & no  Antacid meds, Calcium or Magnesium for 4 hours & avoid Biotin  . omeprazole (PRILOSEC) 40 MG capsule Take 1 capsule by mouth twice daily   No facility-administered encounter medications on file as of 08/25/2019.     REVIEW OF SYSTEMS  : All other systems reviewed and negative except where noted in the History of Present Illness.   PHYSICAL EXAM: BP 114/62 (BP Location: Left Arm, Patient Position: Sitting, Cuff Size: Normal)   Pulse 64   Temp 97.9 F (36.6 C)   Ht 5\' 5"  (1.651 m)   Wt 120 lb (54.4 kg)   SpO2 96%   BMI 19.97 kg/m  General: Well developed white male in no acute distress Head: Normocephalic and atraumatic Eyes:  Sclerae anicteric, conjunctiva pink. Ears: Normal auditory acuity Lungs: Clear throughout to auscultation; no increased WOB. Heart: Regular rate and rhythm; no M/R/G. Abdomen: Soft, non-distended.  BS present.  Non-tender. Musculoskeletal: Symmetrical with no gross deformities  Skin: No lesions on visible extremities Extremities: No edema  Neurological: Alert oriented x 4, grossly non-focal Psychological:  Alert and cooperative. Normal mood and affect  ASSESSMENT AND PLAN: *79 year old male with high-grade esophageal stenosis dilated to 18 mm with balloon dilation in May 2020.  Was supposed to have this repeated in 3 months, but for some reason this was never performed.  He is here now with complaints of severe dysphagia.  He is to the point that he is not able to keep down much at all, even water.  He is becoming very weak.  I discussed with his son-in-law, Shela Leff, who is the office supervisor of a gastroenterology office here in [place].  I think that we need to plan to try to get him scheduled for an EGD in the hospital setting with general anesthesia for pneumatic balloon dilation sometime early next week.  I am waiting to hear back from Dr. Silverio Decamp regarding her availability.  If not possible then I will reach out to some of our other  physicians to see who can accommodate this.   CC:  [name], MD

## 2019-08-25 NOTE — H&P (View-Only) (Signed)
08/25/2019 HELIODORO GORIS [id number] 20-Apr-1941   HISTORY OF PRESENT ILLNESS: This is a 79 year old male who is a patient of Dr. Woodward Ku.  He was last seen for EGD in May 2020 for complaints of dysphagia.  Has history of high-grade esophageal stenosis and EGD at that time showed the following:  - Dilation in the entire esophagus. - Food in the esophagus. Removal was successful. - Diverticulum in the lower third of the esophagus. - Benign-appearing high grade esophageal stenosis. Dilated with savary after passing guidewire under fluoro to max 65mm followed by TTS balloon to 38mm. - Gastritis. - Normal examined duodenum.  He was supposed to have repeat EGD with dilation in 3 months, but for one reason or the other that never got performed.  He is here today with his wife with complaints of dysphagia.  Able to keep little to nothing down including water.  Is becoming very weak.  Just progressively been worsening over the past several months.  Past Medical History:  Diagnosis Date  . AAA (abdominal aortic aneurysm) ([place])    no longer monitoring was small area  . Achalasia   . Carotid stenosis, asymptomatic, bilateral    per duplex --- bilateral <50% ICA  . COPD (chronic obstructive pulmonary disease) ([place])    pulmologist-  dr [name]  . Diabetes mellitus without complication (HCC)    diet controlled  . GERD (gastroesophageal reflux disease)   . Hyperlipidemia   . Hypertension   . Hypothyroidism   . Lesion of left lung    followed by dr [name] (pulmologist)  post infection scarring  . Mild dementia ([place])   . OSA (obstructive sleep apnea)    has a cpap-  intolerant; due to Claustaphobia (01/25/2018)  . Peripheral vascular disease [facility])    cardiologist-- dr berry-- per last ABI/Aorta/Iliac 06-09-2016  patent bilateral iliac kissing stents, >50% stenosis bilateral iliac artery (R>L) and left external iliac artery, patent IVC  . Prostate cancer [facility])    urologist-  dr [name]  oncologist-  dr [name]. Dx'd 2018 s/p XRT  . RLS (restless legs syndrome)   . S/P TAVR (transcatheter aortic valve replacement) 02/2018   s/p TAVR via transapical approach with an Edwards Sapien 3 THV (size 26 mm, model # 9600TFX, serial # [id number])  . Schatzki's ring   . Severe aortic stenosis    Past Surgical History:  Procedure Laterality Date  . ABDOMINAL AORTOGRAM N/A 01/25/2018   Procedure: ABDOMINAL AORTOGRAM;  Surgeon: [name], MD;  Location: [place] [facility];  Service: Cardiovascular;  Laterality: N/A;  . ANAL FISTULECTOMY  2010  . BALLOON DILATION N/A 11/17/2018   Procedure: BALLOON DILATION;  Surgeon: [name], MD;  [facility];  Service: Endoscopy;  Laterality: N/A;  . CARDIAC CATHETERIZATION Bilateral 01/25/2018  . CARDIOVASCULAR STRESS TEST  04-23-2016   dr berry   normal nuclear study w/ no ischemia/  normal LV function and wall function, nuclear stress ef 57%  . CATARACT EXTRACTION W/ INTRAOCULAR LENS  IMPLANT, BILATERAL Bilateral   . COLONOSCOPY    . ESOPHAGEAL MANOMETRY N/A 10/12/2017   Procedure: ESOPHAGEAL MANOMETRY (EM);  Surgeon: [name], MD;  [facility];  Service: Gastroenterology;  Laterality: N/A;  . ESOPHAGOGASTRODUODENOSCOPY N/A 10/12/2017   Procedure: ESOPHAGOGASTRODUODENOSCOPY (EGD);  Surgeon: [name], MD;  [facility];  Service: Gastroenterology;  Laterality: N/A;  . ESOPHAGOGASTRODUODENOSCOPY (EGD) WITH ESOPHAGEAL DILATION  last one 11-09-2015  dr Yolanda Bonine   dilation  schatzki's ring  . ESOPHAGOGASTRODUODENOSCOPY (EGD) WITH PROPOFOL N/A 11/17/2018   Procedure: ESOPHAGOGASTRODUODENOSCOPY;  Surgeon: [name], MD;  [facility];  Service: Endoscopy;  Laterality: N/A;  . FOREIGN BODY REMOVAL  11/17/2018   Procedure: FOREIGN BODY REMOVAL;  Surgeon: [name], MD;  [facility];  Service: Endoscopy;;  . GOLD SEED IMPLANT N/A 04/29/2017   Procedure:  GOLD SEED IMPLANT;  Surgeon: [name], MD;  Location: [facility];  Service: Urology;  Laterality: N/A;  . HERNIA REPAIR    . INGUINAL HERNIA REPAIR Right 02/23/2014   Procedure: HERNIA REPAIR INGUINAL ADULT;  Surgeon: Joyice Faster. Cornett, MD;  Location: [place];  Service: General;  Laterality: Right;  . INSERTION OF MESH Right 02/23/2014   Procedure: INSERTION OF MESH;  Surgeon: Joyice Faster. Cornett, MD;  Location: [place];  Service: General;  Laterality: Right;  . PERIPHERAL VASCULAR CATHETERIZATION N/A 05/29/2016   Procedure: Lower Extremity Angiography;  Surgeon: [name], MD;  Location: [place] [facility];  Service: Cardiovascular;  Laterality: N/A; Diamondback orbital rotational atherectomy, PTA and stenting bilateral common iliac arteries   . PILONIDAL CYST / SINUS EXCISION  2000  . PROSTATE BIOPSY  09-22-2016  dr [name] office  . RIGHT/LEFT HEART CATH AND CORONARY ANGIOGRAPHY N/A 01/25/2018   Procedure: RIGHT/LEFT HEART CATH AND CORONARY ANGIOGRAPHY;  Surgeon: [name], MD;  Location: [name] [facility];  Service: Cardiovascular;  Laterality: N/A;  . SAVORY DILATION N/A 10/12/2017   Procedure: SAVORY DILATION;  Surgeon: [name], MD;  [facility];  Service: Gastroenterology;  Laterality: N/A;  . SAVORY DILATION N/A 11/17/2018   Procedure: SAVORY DILATION;  Surgeon: [name], MD;  [facility];  Service: Endoscopy;  Laterality: N/A;  . SPACE OAR INSTILLATION N/A 04/29/2017   Procedure: SPACE OAR INSTILLATION;  Surgeon: [name], MD;  Location: [facility];  Service: Urology;  Laterality: N/A;  . TRANSCATHETER AORTIC VALVE REPLACEMENT, TRANSAPICAL Left 03/16/2018   Procedure: TRANSCATHETER AORTIC VALVE REPLACEMENT, TRANSAPICAL;  Surgeon: Sherren Mocha, MD;  Location: [place];  Service: Open Heart Surgery;  Laterality: Left;  . TRANSTHORACIC ECHOCARDIOGRAM   05-05-2016   dr berry   ef 0000000,  grade 1diastolic dysfunction/  moderate AV stenosis with mod. AR (right coronary and noncoronary cusp mobility seversly restricted);  valve area 1.25cm^2, mean gradient 27 mmHg, peak gradient 80mmHg/  mild MR  . UPPER GASTROINTESTINAL ENDOSCOPY      reports that he quit smoking about 14 years ago. His smoking use included cigarettes. He has a 50.00 pack-year smoking history. He has never used smokeless tobacco. He reports that he does not drink alcohol or use drugs. family history includes Asthma in his father; Heart attack in his mother; Stroke in his father. Allergies  Allergen Reactions  . Penicillins Anaphylaxis, Itching, Swelling and Other (See Comments)    Did it involve swelling of the face/tongue/throat, SOB, or low BP? Yes Did it involve sudden or severe rash/hives, skin peeling, or any reaction on the inside of your mouth or nose? No Did you need to seek medical attention at a hospital or doctor's office? Yes When did it last happen?Many years ago If all above answers are "NO", may proceed with cephalosporin use.   . Sulfa Antibiotics Anaphylaxis and Swelling  . Versed [Midazolam] Other (See Comments)    Confusion, delerium      Outpatient Encounter Medications as of 08/25/2019  Medication  Sig  . albuterol (PROVENTIL HFA;VENTOLIN HFA) 108 (90 Base) MCG/ACT inhaler Inhale 1 puff into the lungs every 6 (six) hours as needed for wheezing or shortness of breath.  [name] aspirin EC 81 MG tablet Take 81 mg by mouth daily.  . Cholecalciferol (VITAMIN D-3) 5000 units TABS Take 5,000 Units by mouth daily at 3 pm.  . feeding supplement, ENSURE ENLIVE, (ENSURE ENLIVE) LIQD Take 237 mLs by mouth 5 (five) times daily.   . fluticasone (FLONASE) 50 MCG/ACT nasal spray Place 2 sprays into both nostrils daily as needed for allergies or rhinitis.   [name] levothyroxine (SYNTHROID) 50 MCG tablet Take 1 tablet daily on an empty stomach with only water for 30 minutes & no  Antacid meds, Calcium or Magnesium for 4 hours & avoid Biotin  . omeprazole (PRILOSEC) 40 MG capsule Take 1 capsule by mouth twice daily   No facility-administered encounter medications on file as of 08/25/2019.     REVIEW OF SYSTEMS  : All other systems reviewed and negative except where noted in the History of Present Illness.   PHYSICAL EXAM: BP 114/62 (BP Location: Left Arm, Patient Position: Sitting, Cuff Size: Normal)   Pulse 64   Temp 97.9 F (36.6 C)   Ht 5\' 5"  (1.651 m)   Wt 120 lb (54.4 kg)   SpO2 96%   BMI 19.97 kg/m  General: Well developed white male in no acute distress Head: Normocephalic and atraumatic Eyes:  Sclerae anicteric, conjunctiva pink. Ears: Normal auditory acuity Lungs: Clear throughout to auscultation; no increased WOB. Heart: Regular rate and rhythm; no M/R/G. Abdomen: Soft, non-distended.  BS present.  Non-tender. Musculoskeletal: Symmetrical with no gross deformities  Skin: No lesions on visible extremities Extremities: No edema  Neurological: Alert oriented x 4, grossly non-focal Psychological:  Alert and cooperative. Normal mood and affect  ASSESSMENT AND PLAN: *79 year old male with high-grade esophageal stenosis dilated to 18 mm with balloon dilation in May 2020.  Was supposed to have this repeated in 3 months, but for some reason this was never performed.  He is here now with complaints of severe dysphagia.  He is to the point that he is not able to keep down much at all, even water.  He is becoming very weak.  I discussed with his son-in-law, Shela Leff, who is the office supervisor of a gastroenterology office here in [place].  I think that we need to plan to try to get him scheduled for an EGD in the hospital setting with general anesthesia for pneumatic balloon dilation sometime early next week.  I am waiting to hear back from Dr. Silverio Decamp regarding her availability.  If not possible then I will reach out to some of our other  physicians to see who can accommodate this.   CC:  [name], MD

## 2019-08-26 ENCOUNTER — Other Ambulatory Visit: Payer: Self-pay

## 2019-08-26 ENCOUNTER — Emergency Department (HOSPITAL_COMMUNITY): Payer: Medicare Other | Admitting: Certified Registered Nurse Anesthetist

## 2019-08-26 ENCOUNTER — Emergency Department (HOSPITAL_COMMUNITY): Payer: Medicare Other

## 2019-08-26 ENCOUNTER — Emergency Department (HOSPITAL_COMMUNITY)
Admission: EM | Admit: 2019-08-26 | Discharge: 2019-08-26 | Disposition: A | Discharge status: Home / Self Care | Payer: Medicare Other | Attending: Emergency Medicine | Admitting: Emergency Medicine

## 2019-08-26 ENCOUNTER — Encounter (HOSPITAL_COMMUNITY)
Admission: EM | Disposition: A | Discharge status: Home / Self Care | Payer: Self-pay | Source: Home / Self Care | Attending: Emergency Medicine

## 2019-08-26 ENCOUNTER — Telehealth: Payer: Self-pay | Admitting: *Deleted

## 2019-08-26 ENCOUNTER — Encounter (HOSPITAL_COMMUNITY): Payer: Self-pay | Admitting: *Deleted

## 2019-08-26 DIAGNOSIS — R1110 Vomiting, unspecified: Secondary | ICD-10-CM | POA: Diagnosis not present

## 2019-08-26 DIAGNOSIS — Z8546 Personal history of malignant neoplasm of prostate: Secondary | ICD-10-CM | POA: Diagnosis not present

## 2019-08-26 DIAGNOSIS — Q396 Congenital diverticulum of esophagus: Secondary | ICD-10-CM | POA: Insufficient documentation

## 2019-08-26 DIAGNOSIS — Z79890 Hormone replacement therapy (postmenopausal): Secondary | ICD-10-CM | POA: Insufficient documentation

## 2019-08-26 DIAGNOSIS — I10 Essential (primary) hypertension: Secondary | ICD-10-CM | POA: Insufficient documentation

## 2019-08-26 DIAGNOSIS — Z952 Presence of prosthetic heart valve: Secondary | ICD-10-CM | POA: Diagnosis not present

## 2019-08-26 DIAGNOSIS — J449 Chronic obstructive pulmonary disease, unspecified: Secondary | ICD-10-CM | POA: Insufficient documentation

## 2019-08-26 DIAGNOSIS — Z20822 Contact with and (suspected) exposure to covid-19: Secondary | ICD-10-CM | POA: Diagnosis not present

## 2019-08-26 DIAGNOSIS — E1151 Type 2 diabetes mellitus with diabetic peripheral angiopathy without gangrene: Secondary | ICD-10-CM | POA: Diagnosis not present

## 2019-08-26 DIAGNOSIS — Z79899 Other long term (current) drug therapy: Secondary | ICD-10-CM | POA: Insufficient documentation

## 2019-08-26 DIAGNOSIS — K222 Esophageal obstruction: Secondary | ICD-10-CM | POA: Diagnosis not present

## 2019-08-26 DIAGNOSIS — Z7982 Long term (current) use of aspirin: Secondary | ICD-10-CM | POA: Diagnosis not present

## 2019-08-26 DIAGNOSIS — R0602 Shortness of breath: Secondary | ICD-10-CM | POA: Diagnosis not present

## 2019-08-26 DIAGNOSIS — Z8719 Personal history of other diseases of the digestive system: Secondary | ICD-10-CM | POA: Diagnosis not present

## 2019-08-26 DIAGNOSIS — K220 Achalasia of cardia: Secondary | ICD-10-CM | POA: Insufficient documentation

## 2019-08-26 DIAGNOSIS — K219 Gastro-esophageal reflux disease without esophagitis: Secondary | ICD-10-CM | POA: Diagnosis not present

## 2019-08-26 DIAGNOSIS — W44F3XA Food entering into or through a natural orifice, initial encounter: Secondary | ICD-10-CM

## 2019-08-26 DIAGNOSIS — T18128A Food in esophagus causing other injury, initial encounter: Secondary | ICD-10-CM | POA: Diagnosis not present

## 2019-08-26 DIAGNOSIS — G4733 Obstructive sleep apnea (adult) (pediatric): Secondary | ICD-10-CM | POA: Diagnosis not present

## 2019-08-26 DIAGNOSIS — E039 Hypothyroidism, unspecified: Secondary | ICD-10-CM | POA: Insufficient documentation

## 2019-08-26 DIAGNOSIS — F0390 Unspecified dementia without behavioral disturbance: Secondary | ICD-10-CM | POA: Insufficient documentation

## 2019-08-26 DIAGNOSIS — I714 Abdominal aortic aneurysm, without rupture: Secondary | ICD-10-CM | POA: Insufficient documentation

## 2019-08-26 DIAGNOSIS — X58XXXA Exposure to other specified factors, initial encounter: Secondary | ICD-10-CM | POA: Diagnosis not present

## 2019-08-26 DIAGNOSIS — K449 Diaphragmatic hernia without obstruction or gangrene: Secondary | ICD-10-CM | POA: Diagnosis not present

## 2019-08-26 DIAGNOSIS — I6523 Occlusion and stenosis of bilateral carotid arteries: Secondary | ICD-10-CM | POA: Insufficient documentation

## 2019-08-26 DIAGNOSIS — R1310 Dysphagia, unspecified: Secondary | ICD-10-CM | POA: Diagnosis present

## 2019-08-26 DIAGNOSIS — Z03818 Encounter for observation for suspected exposure to other biological agents ruled out: Secondary | ICD-10-CM | POA: Diagnosis not present

## 2019-08-26 DIAGNOSIS — Z87891 Personal history of nicotine dependence: Secondary | ICD-10-CM | POA: Diagnosis not present

## 2019-08-26 HISTORY — PX: BOTOX INJECTION: SHX5754

## 2019-08-26 HISTORY — PX: FOREIGN BODY REMOVAL: SHX962

## 2019-08-26 HISTORY — PX: ESOPHAGOGASTRODUODENOSCOPY (EGD) WITH PROPOFOL: SHX5813

## 2019-08-26 LAB — CBC WITH DIFFERENTIAL/PLATELET
Abs Immature Granulocytes: 0.01 10*3/uL (ref 0.00–0.07)
Basophils Absolute: 0 10*3/uL (ref 0.0–0.1)
Basophils Relative: 1 %
Eosinophils Absolute: 0.1 10*3/uL (ref 0.0–0.5)
Eosinophils Relative: 4 %
HCT: 35.5 % — ABNORMAL LOW (ref 39.0–52.0)
Hemoglobin: 11.8 g/dL — ABNORMAL LOW (ref 13.0–17.0)
Immature Granulocytes: 0 %
Lymphocytes Relative: 20 %
Lymphs Abs: 0.8 10*3/uL (ref 0.7–4.0)
MCH: 32.5 pg (ref 26.0–34.0)
MCHC: 33.2 g/dL (ref 30.0–36.0)
MCV: 97.8 fL (ref 80.0–100.0)
Monocytes Absolute: 0.3 10*3/uL (ref 0.1–1.0)
Monocytes Relative: 8 %
Neutro Abs: 2.6 10*3/uL (ref 1.7–7.7)
Neutrophils Relative %: 67 %
Platelets: 146 10*3/uL — ABNORMAL LOW (ref 150–400)
RBC: 3.63 MIL/uL — ABNORMAL LOW (ref 4.22–5.81)
RDW: 13.7 % (ref 11.5–15.5)
WBC: 3.9 10*3/uL — ABNORMAL LOW (ref 4.0–10.5)
nRBC: 0 % (ref 0.0–0.2)

## 2019-08-26 LAB — RESPIRATORY PANEL BY RT PCR (FLU A&B, COVID)
Influenza A by PCR: NEGATIVE
Influenza B by PCR: NEGATIVE
SARS Coronavirus 2 by RT PCR: NEGATIVE

## 2019-08-26 LAB — COMPREHENSIVE METABOLIC PANEL
ALT: 9 U/L (ref 0–44)
AST: 14 U/L — ABNORMAL LOW (ref 15–41)
Albumin: 3.8 g/dL (ref 3.5–5.0)
Alkaline Phosphatase: 60 U/L (ref 38–126)
Anion gap: 8 (ref 5–15)
BUN: 11 mg/dL (ref 8–23)
CO2: 27 mmol/L (ref 22–32)
Calcium: 9.8 mg/dL (ref 8.9–10.3)
Chloride: 106 mmol/L (ref 98–111)
Creatinine, Ser: 0.81 mg/dL (ref 0.61–1.24)
GFR calc Af Amer: >60 mL/min (ref >60)
GFR calc non Af Amer: >60 mL/min (ref >60)
Glucose, Bld: 114 mg/dL — ABNORMAL HIGH (ref 70–99)
Potassium: 4.1 mmol/L (ref 3.5–5.1)
Sodium: 141 mmol/L (ref 135–145)
Total Bilirubin: 0.8 mg/dL (ref 0.3–1.2)
Total Protein: 7.1 g/dL (ref 6.5–8.1)

## 2019-08-26 SURGERY — ESOPHAGOGASTRODUODENOSCOPY (EGD) WITH PROPOFOL
Anesthesia: General

## 2019-08-26 MED ORDER — SODIUM CHLORIDE (PF) 0.9 % IJ SOLN
INTRAMUSCULAR | Status: AC
  Filled 2019-08-26: qty 10

## 2019-08-26 MED ORDER — ESMOLOL HCL 100 MG/10ML IV SOLN
INTRAVENOUS | Status: DC | PRN
  Administered 2019-08-26: 20 mg via INTRAVENOUS

## 2019-08-26 MED ORDER — PROPOFOL 1000 MG/100ML IV EMUL
INTRAVENOUS | Status: AC
  Filled 2019-08-26: qty 100

## 2019-08-26 MED ORDER — ONABOTULINUMTOXINA 100 UNITS IJ SOLR
INTRAMUSCULAR | Status: DC | PRN
  Administered 2019-08-26: 100 [IU] via INTRAMUSCULAR

## 2019-08-26 MED ORDER — ONDANSETRON HCL 4 MG/2ML IJ SOLN
INTRAMUSCULAR | Status: DC | PRN
  Administered 2019-08-26: 4 mg via INTRAVENOUS

## 2019-08-26 MED ORDER — ONABOTULINUMTOXINA 100 UNITS IJ SOLR
INTRAMUSCULAR | Status: AC
  Filled 2019-08-26: qty 100

## 2019-08-26 MED ORDER — SUCRALFATE 1 GM/10ML PO SUSP: 1.0000 g | Freq: Two times a day (BID) | ORAL | 0 refills | Status: DC

## 2019-08-26 MED ORDER — LIDOCAINE 2% (20 MG/ML) 5 ML SYRINGE
INTRAMUSCULAR | Status: DC | PRN
  Administered 2019-08-26: 60 mg via INTRAVENOUS

## 2019-08-26 MED ORDER — PROPOFOL 10 MG/ML IV BOLUS
INTRAVENOUS | Status: DC | PRN
  Administered 2019-08-26: 100 mg via INTRAVENOUS

## 2019-08-26 MED ORDER — PANTOPRAZOLE SODIUM 20 MG PO TBEC: 20.0000 mg | DELAYED_RELEASE_TABLET | Freq: Two times a day (BID) | ORAL | 0 refills | Status: DC

## 2019-08-26 MED ORDER — EPHEDRINE SULFATE-NACL 50-0.9 MG/10ML-% IV SOSY
PREFILLED_SYRINGE | INTRAVENOUS | Status: DC | PRN
  Administered 2019-08-26 (×3): 5 mg via INTRAVENOUS

## 2019-08-26 MED ORDER — GLUCAGON HCL RDNA (DIAGNOSTIC) 1 MG IJ SOLR
1.0000 mg | Freq: Once | INTRAMUSCULAR | Status: AC
  Administered 2019-08-26: 14:00:00 1 mg via INTRAVENOUS
  Filled 2019-08-26: qty 1

## 2019-08-26 MED ORDER — LACTATED RINGERS IV SOLN
INTRAVENOUS | Status: DC
  Administered 2019-08-26: 16:00:00 1000 mL via INTRAVENOUS

## 2019-08-26 MED ORDER — SUCCINYLCHOLINE CHLORIDE 20 MG/ML IJ SOLN
INTRAMUSCULAR | Status: DC | PRN
  Administered 2019-08-26: 60 mg via INTRAVENOUS

## 2019-08-26 MED ORDER — PROPOFOL 500 MG/50ML IV EMUL: INTRAVENOUS | Status: DC | PRN

## 2019-08-26 MED ORDER — PHENYLEPHRINE 40 MCG/ML (10ML) SYRINGE FOR IV PUSH (FOR BLOOD PRESSURE SUPPORT)
PREFILLED_SYRINGE | INTRAVENOUS | Status: DC | PRN
  Administered 2019-08-26 (×3): 80 ug via INTRAVENOUS

## 2019-08-26 MED ORDER — SODIUM CHLORIDE 0.9 % IV BOLUS (SEPSIS)
500.0000 mL | Freq: Once | INTRAVENOUS | Status: AC
  Administered 2019-08-26: 500 mL via INTRAVENOUS

## 2019-08-26 SURGICAL SUPPLY — 15 items

## 2019-08-26 NOTE — ED Notes (Signed)
Patient transported to endo.  

## 2019-08-26 NOTE — ED Notes (Signed)
Patient's wife called family to provide transport.

## 2019-08-26 NOTE — ED Notes (Signed)
Per MD request, attempted to call patient's wife to join patient at bedside with no answer.

## 2019-08-26 NOTE — Interval H&P Note (Signed)
History and Physical Interval Note:  08/26/2019 3:40 PM  Robert Schaefer  has presented today for surgery, with the diagnosis of Achalasia/food impaction.  The various methods of treatment have been discussed with the patient and family. After consideration of risks, benefits and other options for treatment, the patient has consented to  Procedure(s): ESOPHAGOGASTRODUODENOSCOPY (EGD) WITH PROPOFOL (N/A) as a surgical intervention.  The patient's history has been reviewed, patient examined, no change in status, stable for surgery.  I have reviewed the patient's chart and labs.  Questions were answered to the patient's satisfaction.     [name]

## 2019-08-26 NOTE — Progress Notes (Signed)
He has distal esophageal diverticula and severe esophageal stricture likely peptic which was dilated to 18 mm 10/2018. He didn't come back for serial esophageal dilations to prevent recurrent stricture. Based on history, worried that he is not tolerating any PO intake including liquids/water. Will need to exclude food impaction, patient was advised to come to ER, reluctant but agreed to come in this afternoon.  He will need IV hydration and admission. Plan for EGD as inpatient, will discuss with inpatient team.  Reviewed and agree with documentation and assessment and plan. Damaris Hippo , MD

## 2019-08-26 NOTE — ED Notes (Signed)
Per patient's daughter, [name], patient has had worsening vomiting after PO intake x2 weeks. Hx dysphagia and dementia. Reports patient's wife will come to be with patient. MD made aware.

## 2019-08-26 NOTE — Telephone Encounter (Signed)
Left message for patient to call office.  

## 2019-08-26 NOTE — H&P (View-Only) (Signed)
Consultation  Referring Provider:    Milton Ferguson, MD (ER) Primary Care Physician:  [name], MD Primary Gastroenterologist:   Dr. Silverio Decamp      Reason for Consultation:    Dysphagia, food impaction         HPI:   Robert Schaefer is a 79 y.o. male with a history of achalasia type II, esophageal diverticulum, hiatal hernia, aortic stenosis (s/p TAVR), HTN, HLD, presenting with progressive dysphagia, now not tolerating p.o. intake.  Was seen in the GI clinic yesterday.  Notes extensively reviewed.  Was called by his primary Gastroenterologist, Dr. Silverio Decamp, and recommended returning to the ER for emergent EGD for food disimpaction.  He states symptoms have been progressive, but worsening over the last 1 to 2 weeks.  Last EGD was 10/2018, requiring initially a savory dilation with fluoroscopy and eventually 18 mm TTS balloon to LES stricture.  He opted for serial pneumatic dilations rather than Heller myotomy or POEM, but never returned for repeat EGD with dilation.  No longer on any systemic anticoagulation.  History reviewed with the patient and his wife at bedside today.  Past Medical History:  Diagnosis Date  . AAA (abdominal aortic aneurysm) ([place])    no longer monitoring was small area  . Achalasia   . Carotid stenosis, asymptomatic, bilateral    per duplex --- bilateral <50% ICA  . COPD (chronic obstructive pulmonary disease) ([place])    pulmologist-  dr [name]  . Diabetes mellitus without complication (HCC)    diet controlled  . GERD (gastroesophageal reflux disease)   . Hyperlipidemia   . Hypertension   . Hypothyroidism   . Lesion of left lung    followed by dr [name] (pulmologist)  post infection scarring  . Mild dementia ([place])   . OSA (obstructive sleep apnea)    has a cpap-  intolerant; due to Claustaphobia (01/25/2018)  . Peripheral vascular disease [facility])    cardiologist-- dr berry-- per last ABI/Aorta/Iliac 06-09-2016  patent bilateral iliac kissing stents, >50%  stenosis bilateral iliac artery (R>L) and left external iliac artery, patent IVC  . Prostate cancer [facility])    urologist-  dr [name] oncologist-  dr [name]. Dx'd 2018 s/p XRT  . RLS (restless legs syndrome)   . S/P TAVR (transcatheter aortic valve replacement) 02/2018   s/p TAVR via transapical approach with an Edwards Sapien 3 THV (size 26 mm, model # 9600TFX, serial # [id number])  . Schatzki's ring   . Severe aortic stenosis     Past Surgical History:  Procedure Laterality Date  . ABDOMINAL AORTOGRAM N/A 01/25/2018   Procedure: ABDOMINAL AORTOGRAM;  Surgeon: [name], MD;  Location: [place] [facility];  Service: Cardiovascular;  Laterality: N/A;  . ANAL FISTULECTOMY  2010  . BALLOON DILATION N/A 11/17/2018   Procedure: BALLOON DILATION;  Surgeon: [name], MD;  Location: [facility];  Service: Endoscopy;  Laterality: N/A;  . CARDIAC CATHETERIZATION Bilateral 01/25/2018  . CARDIOVASCULAR STRESS TEST  04-23-2016   dr berry   normal nuclear study w/ no ischemia/  normal LV function and wall function, nuclear stress ef 57%  . CATARACT EXTRACTION W/ INTRAOCULAR LENS  IMPLANT, BILATERAL Bilateral   . COLONOSCOPY    . ESOPHAGEAL MANOMETRY N/A 10/12/2017   Procedure: ESOPHAGEAL MANOMETRY (EM);  Surgeon: [name], MD;  Location: [facility];  Service: Gastroenterology;  Laterality: N/A;  . ESOPHAGOGASTRODUODENOSCOPY N/A 10/12/2017   Procedure: ESOPHAGOGASTRODUODENOSCOPY (EGD);  Surgeon: [name], MD;  [facility];  Service: Gastroenterology;  Laterality: N/A;  . ESOPHAGOGASTRODUODENOSCOPY (EGD) WITH ESOPHAGEAL DILATION  last one 11-09-2015  dr Yolanda Bonine   dilation schatzki's ring  . ESOPHAGOGASTRODUODENOSCOPY (EGD) WITH PROPOFOL N/A 11/17/2018   Procedure: ESOPHAGOGASTRODUODENOSCOPY;  Surgeon: [name], MD;  [facility];  Service: Endoscopy;  Laterality: N/A;  . FOREIGN BODY REMOVAL  11/17/2018   Procedure: FOREIGN BODY  REMOVAL;  Surgeon: [name], MD;  Location: [facility];  Service: Endoscopy;;  . GOLD SEED IMPLANT N/A 04/29/2017   Procedure: GOLD SEED IMPLANT;  Surgeon: [name], MD;  Location: [facility];  Service: Urology;  Laterality: N/A;  . HERNIA REPAIR    . INGUINAL HERNIA REPAIR Right 02/23/2014   Procedure: HERNIA REPAIR INGUINAL ADULT;  Surgeon: Joyice Faster. Cornett, MD;  Location: [place];  Service: General;  Laterality: Right;  . INSERTION OF MESH Right 02/23/2014   Procedure: INSERTION OF MESH;  Surgeon: Joyice Faster. Cornett, MD;  Location: [place];  Service: General;  Laterality: Right;  . PERIPHERAL VASCULAR CATHETERIZATION N/A 05/29/2016   Procedure: Lower Extremity Angiography;  Surgeon: [name], MD;  Location: [place] [facility];  Service: Cardiovascular;  Laterality: N/A; [place] orbital rotational atherectomy, PTA and stenting bilateral common iliac arteries   . PILONIDAL CYST / SINUS EXCISION  2000  . PROSTATE BIOPSY  09-22-2016  dr [name] office  . RIGHT/LEFT HEART CATH AND CORONARY ANGIOGRAPHY N/A 01/25/2018   Procedure: RIGHT/LEFT HEART CATH AND CORONARY ANGIOGRAPHY;  Surgeon: [name], MD;  Location: [place] [facility];  Service: Cardiovascular;  Laterality: N/A;  . SAVORY DILATION N/A 10/12/2017   Procedure: SAVORY DILATION;  Surgeon: [name], MD;  [facility];  Service: Gastroenterology;  Laterality: N/A;  . SAVORY DILATION N/A 11/17/2018   Procedure: SAVORY DILATION;  Surgeon: [name], MD;  [facility];  Service: Endoscopy;  Laterality: N/A;  . SPACE OAR INSTILLATION N/A 04/29/2017   Procedure: SPACE OAR INSTILLATION;  Surgeon: [name], MD;  Location: [facility];  Service: Urology;  Laterality: N/A;  . TRANSCATHETER AORTIC VALVE REPLACEMENT, TRANSAPICAL Left 03/16/2018   Procedure: TRANSCATHETER AORTIC VALVE REPLACEMENT,  TRANSAPICAL;  Surgeon: Sherren Mocha, MD;  Location: [place];  Service: Open Heart Surgery;  Laterality: Left;  . TRANSTHORACIC ECHOCARDIOGRAM  05-05-2016   dr berry   ef 0000000,  grade 1diastolic dysfunction/  moderate AV stenosis with mod. AR (right coronary and noncoronary cusp mobility seversly restricted);  valve area 1.25cm^2, mean gradient 27 mmHg, peak gradient 68mmHg/  mild MR  . UPPER GASTROINTESTINAL ENDOSCOPY      Family History  Problem Relation Age of Onset  . Heart attack Mother   . Stroke Father   . Asthma Father   . Cancer Neg Hx   . Colon cancer Neg Hx   . Stomach cancer Neg Hx   . Esophageal cancer Neg Hx   . Rectal cancer Neg Hx   . Pancreatic cancer Neg Hx   . Prostate cancer Neg Hx      Social History   Tobacco Use  . Smoking status: Former Smoker    Packs/day: 1.00    Years: 50.00    Pack years: 50.00    Types: Cigarettes    Quit date: 07/05/2005    Years since quitting: 14.1  . Smokeless tobacco: Never Used  Substance Use Topics  . Alcohol use: No  . Drug use: Never  Prior to Admission medications   Medication Sig Start Date End Date Taking? Authorizing Provider  albuterol (PROVENTIL HFA;VENTOLIN HFA) 108 (90 Base) MCG/ACT inhaler Inhale 1 puff into the lungs every 6 (six) hours as needed for wheezing or shortness of breath.   Yes [provider]  aspirin EC 81 MG tablet Take 81 mg by mouth daily.   Yes [provider]  Cholecalciferol (VITAMIN D-3) 5000 units TABS Take 5,000 Units by mouth daily at 3 pm.   Yes [provider]  feeding supplement, ENSURE ENLIVE, (ENSURE ENLIVE) LIQD Take 237 mLs by mouth 5 (five) times daily.    Yes [provider]  fluticasone (FLONASE) 50 MCG/ACT nasal spray Place 2 sprays into both nostrils daily as needed for allergies or rhinitis.    Yes [provider]  levothyroxine (SYNTHROID) 50 MCG tablet Take 1 tablet daily on an empty stomach with only water for 30 minutes & no  Antacid meds, Calcium or Magnesium for 4 hours & avoid Biotin 06/26/19  Yes [name], MD    Current Facility-Administered Medications  Medication Dose Route Frequency Provider Last Rate Last Admin  . lactated ringers infusion   Intravenous Continuous Baraa Tubbs V, DO 10 mL/hr at 08/26/19 1538 1,000 mL at 08/26/19 1538    Allergies as of 08/26/2019 - Review Complete 08/26/2019  Allergen Reaction Noted  . Penicillins Anaphylaxis, Itching, Swelling, and Other (See Comments) 07/04/2013  . Sulfa antibiotics Anaphylaxis and Swelling 07/04/2013  . Versed [midazolam] Other (See Comments) 05/31/2016     Review of Systems:    As per HPI, otherwise negative    Physical Exam:  Vital signs in last 24 hours: Temp:  [98 F (36.7 C)] 98 F (36.7 C) (02/26 1237) Pulse Rate:  [56-83] 56 (02/26 1528) Resp:  [15-18] 18 (02/26 1528) BP: (128-158)/(46-67) 150/46 (02/26 1528) SpO2:  [94 %-98 %] 94 % (02/26 1528) Weight:  [54.3 kg] 54.3 kg (02/26 1240)   General:   Pleasant male in NAD, sitting upright in bed Head:  Normocephalic and atraumatic. Eyes:   No icterus.   Conjunctiva pink. Ears:  Normal auditory acuity. Neck:  Supple Lungs:  Respirations even and unlabored. Lungs clear to auscultation bilaterally.   No wheezes, crackles, or rhonchi.  Heart:  Regular rate and rhythm; no MRG Abdomen:  Soft, nondistended, nontender. Normal bowel sounds. No appreciable masses or hepatomegaly.  Rectal:  Not performed.  Msk:  Symmetrical without gross deformities.  Extremities:  Without edema. Neurologic:  Alert and  oriented x4;  grossly normal neurologically. Skin:  Intact without significant lesions or rashes. Psych:  Alert and cooperative. Normal affect.  LAB RESULTS: Recent Labs    08/26/19 1342  WBC 3.9*  HGB 11.8*  HCT 35.5*  PLT 146*   BMET Recent Labs    08/26/19 1342  NA 141  K 4.1  CL 106  CO2 27  GLUCOSE 114*  BUN 11  CREATININE 0.81  CALCIUM 9.8    LFT Recent Labs    08/26/19 1342  PROT 7.1  ALBUMIN 3.8  AST 14*  ALT 9  ALKPHOS 60  BILITOT 0.8   PT/INR No results for input(s): LABPROT, INR in the last 72 hours.  STUDIES: DG Chest Port 1 View  Result Date: 08/26/2019 CLINICAL DATA:  Shortness of breath and dysphagia EXAM: PORTABLE CHEST 1 VIEW COMPARISON:  September 02, 2018 FINDINGS: Lungs are clear. Heart is upper normal in size with pulmonary vascularity normal. Patient is status post aortic  valve replacement. No adenopathy. There is aortic atherosclerosis. No pneumothorax or pneumomediastinum. Bones are osteoporotic. IMPRESSION: Lungs clear. No pneumomediastinum or pneumothorax. Stable cardiac silhouette. Status post aortic valve replacement. Aortic Atherosclerosis (ICD10-I70.0). Bones osteoporotic. Electronically Signed   By: Lowella Grip III M.D.   On: 08/26/2019 13:28      Impression / Plan:   1) Dysphagia 2) Suspected food impaction 3) History of achalasia type II 4) Esophageal diverticulum  -Plan for emergent EGD today with a goal of esophageal food disimpaction -Discussed the risks, benefits, alternatives, and he wishes to proceed with the procedure.  Discussed possibility of intubation.  He and his wife both agree to proceed. -Did discuss the possibility of Botox injection given food impaction and achalasia patient -Additional recommendations following the procedure, to include setting up optimal time for repeat EGD for serial dilations     LOS: 0 days   Lavena Bullion  08/26/2019, 3:40 PM

## 2019-08-26 NOTE — Discharge Instructions (Addendum)
YOU HAD AN ENDOSCOPIC PROCEDURE TODAY: Refer to the procedure report and other information in the discharge instructions given to you for any specific questions about what was found during the examination. If this information does not answer your questions, please call [place] office at [phone] to clarify.  ° °YOU SHOULD EXPECT: Some feelings of bloating in the abdomen. Passage of more gas than usual. Walking can help get rid of the air that was put into your GI tract during the procedure and reduce the bloating.. ° °DIET: Your first meal following the procedure should be a light meal and then it is ok to progress to your normal diet. A half-sandwich or bowl of soup is an example of a good first meal. Heavy or fried foods are harder to digest and may make you feel nauseous or bloated. Drink plenty of fluids but you should avoid alcoholic beverages for 24 hours. If you had a esophageal dilation, please see attached instructions for diet.   ° °ACTIVITY: Your care partner should take you home directly after the procedure. You should plan to take it easy, moving slowly for the rest of the day. You can resume normal activity the day after the procedure however YOU SHOULD NOT DRIVE, use power tools, machinery or perform tasks that involve climbing or major physical exertion for 24 hours (because of the sedation medicines used during the test).  ° °SYMPTOMS TO REPORT IMMEDIATELY: °A gastroenterologist can be reached at any hour. Please call [phone]  for any of the following symptoms:  ° °Following upper endoscopy (EGD, EUS, ERCP, esophageal dilation) °Vomiting of blood or coffee ground material  °New, significant abdominal pain  °New, significant chest pain or pain under the shoulder blades  °Painful or persistently difficult swallowing  °New shortness of breath  °Black, tarry-looking or red, bloody stools ° °FOLLOW UP:  °If any biopsies were taken you will be contacted by phone or by letter within the next 1-3  weeks. Call [phone]  if you have not heard about the biopsies in 3 weeks.  °Please also call with any specific questions about appointments or follow up tests. ° °

## 2019-08-26 NOTE — ED Triage Notes (Addendum)
Pt ate red meat yesterday and has not been able to swallow food or liquids since, call PCP this morning and was told to come to the ED. Pt later states he has been throwing up since eating the meat

## 2019-08-26 NOTE — Anesthesia Postprocedure Evaluation (Signed)
Anesthesia Post Note  Patient: Robert Schaefer  Procedure(s) Performed: ESOPHAGOGASTRODUODENOSCOPY (EGD) WITH PROPOFOL (N/A ) BOTOX INJECTION     Patient location during evaluation: Endoscopy Anesthesia Type: General Level of consciousness: awake and alert Pain management: pain level controlled Vital Signs Assessment: post-procedure vital signs reviewed and stable Respiratory status: spontaneous breathing, nonlabored ventilation and respiratory function stable Cardiovascular status: blood pressure returned to baseline and stable Postop Assessment: no apparent nausea or vomiting Anesthetic complications: no    Last Vitals:  Vitals:   08/26/19 1710 08/26/19 1720  BP: (!) 141/41 (!) 142/36  Pulse: 65 70  Resp: 18 20  Temp:    SpO2: 100% 92%    Last Pain:  Vitals:   08/26/19 1720  TempSrc:   PainSc: 0-No pain                 Lidia Collum

## 2019-08-26 NOTE — H&P (View-Only) (Signed)
He has distal esophageal diverticula and severe esophageal stricture likely peptic which was dilated to 18 mm 10/2018. He didn't come back for serial esophageal dilations to prevent recurrent stricture. Based on history, worried that he is not tolerating any PO intake including liquids/water. Will need to exclude food impaction, patient was advised to come to ER, reluctant but agreed to come in this afternoon.  He will need IV hydration and admission. Plan for EGD as inpatient, will discuss with inpatient team.  Reviewed and agree with documentation and assessment and plan. Damaris Hippo , MD

## 2019-08-26 NOTE — Anesthesia Procedure Notes (Signed)
Procedure Name: Intubation Performed by: Gean Maidens, CRNA Pre-anesthesia Checklist: Patient identified, Emergency Drugs available, Suction available, Patient being monitored and Timeout performed Patient Re-evaluated:Patient Re-evaluated prior to induction Oxygen Delivery Method: Circle system utilized Preoxygenation: Pre-oxygenation with 100% oxygen Induction Type: IV induction and Rapid sequence Laryngoscope Size: Mac and 3 Grade View: Grade I Tube type: Oral Number of attempts: 1 Airway Equipment and Method: Stylet Placement Confirmation: positive ETCO2 and breath sounds checked- equal and bilateral Secured at: 23 cm Tube secured with: Tape Dental Injury: Teeth and Oropharynx as per pre-operative assessment

## 2019-08-26 NOTE — Transfer of Care (Signed)
Immediate Anesthesia Transfer of Care Note  Patient: Robert Schaefer  Procedure(s) Performed: ESOPHAGOGASTRODUODENOSCOPY (EGD) WITH PROPOFOL (N/A ) BOTOX INJECTION  Patient Location: PACU  Anesthesia Type:General  Level of Consciousness: sedated and patient cooperative  Airway & Oxygen Therapy: Patient Spontanous Breathing and Patient connected to face mask oxygen  Post-op Assessment: Report given to RN and Post -op Vital signs reviewed and stable  Post vital signs: Reviewed and stable  Last Vitals:  Vitals Value Taken Time  BP 163/36 08/26/19 1701  Temp 36.2 C 08/26/19 1701  Pulse 65 08/26/19 1710  Resp 18 08/26/19 1710  SpO2 100 % 08/26/19 1710  Vitals shown include unvalidated device data.  Last Pain:  Vitals:   08/26/19 1701  TempSrc: Temporal  PainSc:          Complications: No apparent anesthesia complications

## 2019-08-26 NOTE — ED Notes (Signed)
MD at bedside. 

## 2019-08-26 NOTE — Progress Notes (Signed)
Consultation  Referring Provider:    Milton Ferguson, MD (ER) Primary Care Physician:  [name], MD Primary Gastroenterologist:   Dr. Silverio Decamp      Reason for Consultation:    Dysphagia, food impaction         HPI:   Robert Schaefer is a 79 y.o. male with a history of achalasia type II, esophageal diverticulum, hiatal hernia, aortic stenosis (s/p TAVR), HTN, HLD, presenting with progressive dysphagia, now not tolerating p.o. intake.  Was seen in the GI clinic yesterday.  Notes extensively reviewed.  Was called by his primary Gastroenterologist, Dr. Silverio Decamp, and recommended returning to the ER for emergent EGD for food disimpaction.  He states symptoms have been progressive, but worsening over the last 1 to 2 weeks.  Last EGD was 10/2018, requiring initially a savory dilation with fluoroscopy and eventually 18 mm TTS balloon to LES stricture.  He opted for serial pneumatic dilations rather than Heller myotomy or POEM, but never returned for repeat EGD with dilation.  No longer on any systemic anticoagulation.  History reviewed with the patient and his wife at bedside today.  Past Medical History:  Diagnosis Date  . AAA (abdominal aortic aneurysm) ([place])    no longer monitoring was small area  . Achalasia   . Carotid stenosis, asymptomatic, bilateral    per duplex --- bilateral <50% ICA  . COPD (chronic obstructive pulmonary disease) ([place])    pulmologist-  dr [name]  . Diabetes mellitus without complication (H[facility])    diet controlled  . GERD (gastroesophageal reflux disease)   . Hyperlipidemia   . Hypertension   . Hypothyroidism   . Lesion of left lung    followed by dr [name] (pulmologist)  post infection scarring  . Mild dementia ([place])   . OSA (obstructive sleep apnea)    has a cpap-  intolerant; due to Claustaphobia (01/25/2018)  . Peripheral vascular disease [facility])    cardiologist-- dr berry-- per last ABI/Aorta/Iliac 06-09-2016  patent bilateral iliac kissing stents, >50%  stenosis bilateral iliac artery (R>L) and left external iliac artery, patent IVC  . Prostate cancer [facility])    urologist-  dr [name] oncologist-  dr [name]. Dx'd 2018 s/p XRT  . RLS (restless legs syndrome)   . S/P TAVR (transcatheter aortic valve replacement) 02/2018   s/p TAVR via transapical approach with an Edwards Sapien 3 THV (size 26 mm, model # 9600TFX, serial # [id number])  . Schatzki's ring   . Severe aortic stenosis     Past Surgical History:  Procedure Laterality Date  . ABDOMINAL AORTOGRAM N/A 01/25/2018   Procedure: ABDOMINAL AORTOGRAM;  Surgeon: [name], MD;  Location: [facility];  Service: Cardiovascular;  Laterality: N/A;  . ANAL FISTULECTOMY  2010  . BALLOON DILATION N/A 11/17/2018   Procedure: BALLOON DILATION;  Surgeon: [name], MD;  Location: [facility];  Service: Endoscopy;  Laterality: N/A;  . CARDIAC CATHETERIZATION Bilateral 01/25/2018  . CARDIOVASCULAR STRESS TEST  04-23-2016   dr berry   normal nuclear study w/ no ischemia/  normal LV function and wall function, nuclear stress ef 57%  . CATARACT EXTRACTION W/ INTRAOCULAR LENS  IMPLANT, BILATERAL Bilateral   . COLONOSCOPY    . ESOPHAGEAL MANOMETRY N/A 10/12/2017   Procedure: ESOPHAGEAL MANOMETRY (EM);  Surgeon: [name], MD;  Location: [facility];  Service: Gastroenterology;  Laterality: N/A;  . ESOPHAGOGASTRODUODENOSCOPY N/A 10/12/2017   Procedure: ESOPHAGOGASTRODUODENOSCOPY (EGD);  Surgeon: [name], MD;  Location: [facility];  Service: Gastroenterology;  Laterality: N/A;  . ESOPHAGOGASTRODUODENOSCOPY (EGD) WITH ESOPHAGEAL DILATION  last one 11-09-2015  dr Yolanda Bonine   dilation schatzki's ring  . ESOPHAGOGASTRODUODENOSCOPY (EGD) WITH PROPOFOL N/A 11/17/2018   Procedure: ESOPHAGOGASTRODUODENOSCOPY;  Surgeon: [name], MD;  Location: [facility];  Service: Endoscopy;  Laterality: N/A;  . FOREIGN BODY REMOVAL  11/17/2018   Procedure: FOREIGN BODY  REMOVAL;  Surgeon: [name], MD;  Location: [facility];  Service: Endoscopy;;  . GOLD SEED IMPLANT N/A 04/29/2017   Procedure: GOLD SEED IMPLANT;  Surgeon: [name], MD;  Location: [facility];  Service: Urology;  Laterality: N/A;  . HERNIA REPAIR    . INGUINAL HERNIA REPAIR Right 02/23/2014   Procedure: HERNIA REPAIR INGUINAL ADULT;  Surgeon: Joyice Faster. Cornett, MD;  Location: [place];  Service: General;  Laterality: Right;  . INSERTION OF MESH Right 02/23/2014   Procedure: INSERTION OF MESH;  Surgeon: Joyice Faster. Cornett, MD;  Location: [place];  Service: General;  Laterality: Right;  . PERIPHERAL VASCULAR CATHETERIZATION N/A 05/29/2016   Procedure: Lower Extremity Angiography;  Surgeon: [name], MD;  Location: [facility];  Service: Cardiovascular;  Laterality: N/A; [place] orbital rotational atherectomy, PTA and stenting bilateral common iliac arteries   . PILONIDAL CYST / SINUS EXCISION  2000  . PROSTATE BIOPSY  09-22-2016  dr [name] office  . RIGHT/LEFT HEART CATH AND CORONARY ANGIOGRAPHY N/A 01/25/2018   Procedure: RIGHT/LEFT HEART CATH AND CORONARY ANGIOGRAPHY;  Surgeon: [name], MD;  Location: [place];  Service: Cardiovascular;  Laterality: N/A;  . SAVORY DILATION N/A 10/12/2017   Procedure: SAVORY DILATION;  Surgeon: [name], MD;  [facility];  Service: Gastroenterology;  Laterality: N/A;  . SAVORY DILATION N/A 11/17/2018   Procedure: SAVORY DILATION;  Surgeon: [name], MD;  Location: [facility];  Service: Endoscopy;  Laterality: N/A;  . SPACE OAR INSTILLATION N/A 04/29/2017   Procedure: SPACE OAR INSTILLATION;  Surgeon: [name], MD;  Location: [facility];  Service: Urology;  Laterality: N/A;  . TRANSCATHETER AORTIC VALVE REPLACEMENT, TRANSAPICAL Left 03/16/2018   Procedure: TRANSCATHETER AORTIC VALVE REPLACEMENT,  TRANSAPICAL;  Surgeon: Sherren Mocha, MD;  Location: [facility];  Service: Open Heart Surgery;  Laterality: Left;  . TRANSTHORACIC ECHOCARDIOGRAM  05-05-2016   dr berry   ef 0000000,  grade 1diastolic dysfunction/  moderate AV stenosis with mod. AR (right coronary and noncoronary cusp mobility seversly restricted);  valve area 1.25cm^2, mean gradient 27 mmHg, peak gradient 79mmHg/  mild MR  . UPPER GASTROINTESTINAL ENDOSCOPY      Family History  Problem Relation Age of Onset  . Heart attack Mother   . Stroke Father   . Asthma Father   . Cancer Neg Hx   . Colon cancer Neg Hx   . Stomach cancer Neg Hx   . Esophageal cancer Neg Hx   . Rectal cancer Neg Hx   . Pancreatic cancer Neg Hx   . Prostate cancer Neg Hx      Social History   Tobacco Use  . Smoking status: Former Smoker    Packs/day: 1.00    Years: 50.00    Pack years: 50.00    Types: Cigarettes    Quit date: 07/05/2005    Years since quitting: 14.1  . Smokeless tobacco: Never Used  Substance Use Topics  . Alcohol use: No  . Drug use: Never  Prior to Admission medications   Medication Sig Start Date End Date Taking? Authorizing Provider  albuterol (PROVENTIL HFA;VENTOLIN HFA) 108 (90 Base) MCG/ACT inhaler Inhale 1 puff into the lungs every 6 (six) hours as needed for wheezing or shortness of breath.   Yes [provider]  aspirin EC 81 MG tablet Take 81 mg by mouth daily.   Yes [provider]  Cholecalciferol (VITAMIN D-3) 5000 units TABS Take 5,000 Units by mouth daily at 3 pm.   Yes [provider]  feeding supplement, ENSURE ENLIVE, (ENSURE ENLIVE) LIQD Take 237 mLs by mouth 5 (five) times daily.    Yes [provider]  fluticasone (FLONASE) 50 MCG/ACT nasal spray Place 2 sprays into both nostrils daily as needed for allergies or rhinitis.    Yes [provider]  levothyroxine (SYNTHROID) 50 MCG tablet Take 1 tablet daily on an empty stomach with only water for 30 minutes & no  Antacid meds, Calcium or Magnesium for 4 hours & avoid Biotin 06/26/19  Yes [name], MD    Current Facility-Administered Medications  Medication Dose Route Frequency Provider Last Rate Last Admin  . lactated ringers infusion   Intravenous Continuous Nomar Broad V, DO 10 mL/hr at 08/26/19 1538 1,000 mL at 08/26/19 1538    Allergies as of 08/26/2019 - Review Complete 08/26/2019  Allergen Reaction Noted  . Penicillins Anaphylaxis, Itching, Swelling, and Other (See Comments) 07/04/2013  . Sulfa antibiotics Anaphylaxis and Swelling 07/04/2013  . Versed [midazolam] Other (See Comments) 05/31/2016     Review of Systems:    As per HPI, otherwise negative    Physical Exam:  Vital signs in last 24 hours: Temp:  [98 F (36.7 C)] 98 F (36.7 C) (02/26 1237) Pulse Rate:  [56-83] 56 (02/26 1528) Resp:  [15-18] 18 (02/26 1528) BP: (128-158)/(46-67) 150/46 (02/26 1528) SpO2:  [94 %-98 %] 94 % (02/26 1528) Weight:  [54.3 kg] 54.3 kg (02/26 1240)   General:   Pleasant male in NAD, sitting upright in bed Head:  Normocephalic and atraumatic. Eyes:   No icterus.   Conjunctiva pink. Ears:  Normal auditory acuity. Neck:  Supple Lungs:  Respirations even and unlabored. Lungs clear to auscultation bilaterally.   No wheezes, crackles, or rhonchi.  Heart:  Regular rate and rhythm; no MRG Abdomen:  Soft, nondistended, nontender. Normal bowel sounds. No appreciable masses or hepatomegaly.  Rectal:  Not performed.  Msk:  Symmetrical without gross deformities.  Extremities:  Without edema. Neurologic:  Alert and  oriented x4;  grossly normal neurologically. Skin:  Intact without significant lesions or rashes. Psych:  Alert and cooperative. Normal affect.  LAB RESULTS: Recent Labs    08/26/19 1342  WBC 3.9*  HGB 11.8*  HCT 35.5*  PLT 146*   BMET Recent Labs    08/26/19 1342  NA 141  K 4.1  CL 106  CO2 27  GLUCOSE 114*  BUN 11  CREATININE 0.81  CALCIUM 9.8    LFT Recent Labs    08/26/19 1342  PROT 7.1  ALBUMIN 3.8  AST 14*  ALT 9  ALKPHOS 60  BILITOT 0.8   PT/INR No results for input(s): LABPROT, INR in the last 72 hours.  STUDIES: DG Chest Port 1 View  Result Date: 08/26/2019 CLINICAL DATA:  Shortness of breath and dysphagia EXAM: PORTABLE CHEST 1 VIEW COMPARISON:  September 02, 2018 FINDINGS: Lungs are clear. Heart is upper normal in size with pulmonary vascularity normal. Patient is status post aortic  valve replacement. No adenopathy. There is aortic atherosclerosis. No pneumothorax or pneumomediastinum. Bones are osteoporotic. IMPRESSION: Lungs clear. No pneumomediastinum or pneumothorax. Stable cardiac silhouette. Status post aortic valve replacement. Aortic Atherosclerosis (ICD10-I70.0). Bones osteoporotic. Electronically Signed   By: Lowella Grip III M.D.   On: 08/26/2019 13:28      Impression / Plan:   1) Dysphagia 2) Suspected food impaction 3) History of achalasia type II 4) Esophageal diverticulum  -Plan for emergent EGD today with a goal of esophageal food disimpaction -Discussed the risks, benefits, alternatives, and he wishes to proceed with the procedure.  Discussed possibility of intubation.  He and his wife both agree to proceed. -Did discuss the possibility of Botox injection given food impaction and achalasia patient -Additional recommendations following the procedure, to include setting up optimal time for repeat EGD for serial dilations     LOS: 0 days   Lavena Bullion  08/26/2019, 3:40 PM

## 2019-08-26 NOTE — Telephone Encounter (Signed)
Spoke with patient and informed him we want him to go to the emergency room. Patient asked that I call his daughter [name] and tell her but she is not available until after 3. I looked a DPR on file and called patient's daughter Jeannene Patella and told her that we recommend that the patient go to the emergency room. She will try and get in touch with [name] and they will call us once they are going to the emergency room.

## 2019-08-26 NOTE — Telephone Encounter (Signed)
Thank you, I informed Dr Bryan Lemma.

## 2019-08-26 NOTE — Op Note (Signed)
[facility] Patient Name: Robert Schaefer Procedure Date: 08/26/2019 MRN: [id number] Attending MD: Gerrit Heck , MD Date of Birth: 01-04-1941 CSN: [id number] Age: 79 Admit Type: Outpatient Procedure:                Upper GI endoscopy Indications:              Dysphagia, Food impaction Providers:                Gerrit Heck, MD, Elspeth Cho Tech.,                            Technician, Herbie Drape, CRNA, Burtis Junes, RN Referring MD:              Medicines:                Monitored Anesthesia Care Complications:            No immediate complications. Estimated Blood Loss:     Estimated blood loss was minimal. Procedure:                Pre-Anesthesia Assessment:                           - Prior to the procedure, a History and Physical                            was performed, and patient medications and                            allergies were reviewed. The patient's tolerance of                            previous anesthesia was also reviewed. The risks                            and benefits of the procedure and the sedation                            options and risks were discussed with the patient.                            All questions were answered, and informed consent                            was obtained. Prior Anticoagulants: The patient has                            taken no previous anticoagulant or antiplatelet                            agents. ASA Grade Assessment: III - A patient with                            severe systemic disease. After reviewing the risks  and benefits, the patient was deemed in                            satisfactory condition to undergo the procedure.                           After obtaining informed consent, the endoscope was                            passed under direct vision. Throughout the                            procedure, the patient's blood pressure, pulse, and              oxygen saturations were monitored continuously. The                            [id number] Olympus gastroscope was                            introduced through the mouth, and advanced to the                            second part of duodenum. The upper GI endoscopy was                            accomplished without difficulty. The patient                            tolerated the procedure well. Scope In: Scope Out: Findings:      Food was found in the lower third of the esophagus. Was able to slide       around the food bolus and advance endoscope into the stomach. Removal of       food was accomplished using a Roth net.      A non-bleeding diverticulum with a large opening was found in the lower       third of the esophagus. This was located 3 cm proximal to the GEJ. This       was filled with food and the food was successfully removed with Jabier Mutton net.      One moderate stenosis was found 41 cm from the incisors, consistent with       history of Achalasia. The stenosis was traversed. Area proximal to the       LES was successfully injected with 4 mL botulinum toxin, injected in 1       cc aliquots (100U total) for muscle relaxation and aid in future       esophageal clearance. Estimated blood loss was minimal.      The entire examined stomach was normal.      The duodenal bulb, first portion of the duodenum and second portion of       the duodenum were normal. Impression:               - Food in the lower third of the esophagus. Removal  was successful.                           - Diverticulum in the lower third of the esophagus.                           - Esophageal stenosis. Injected.                           - Normal stomach.                           - Normal duodenal bulb, first portion of the                            duodenum and second portion of the duodenum. Moderate Sedation:      Not Applicable - Patient had care per  Anesthesia. Recommendation:           - Return patient to ER for possible discharge same                            day.                           - Full liquid diet today. Remain on pureed                            consistency diet until follow-up appointment.                           - Use sucralfate suspension 1 gram PO BID for 2                            weeks.                           - Use Protonix (pantoprazole) 20 mg PO BID.                           - Will schedule follow-up appointment with either                            Alonza Bogus or Dr. Silverio Decamp in the next 2-3 weeks                            at appointment to be scheduled.                           - Plan for repeat upper endoscopy with pneumatic                            dilation with Dr. Silverio Decamp.                           - Defer continued discussion regarding surgical  intervention of large esophageal diverticulum                            and/or surgical vs endoscopic treatment of                            achalasia (ie, serial pneumatic dilations, POEM,                            Heller, etc) to Dr. Silverio Decamp. Procedure Code(s):        --- Professional ---                           [id number], Esophagogastroduodenoscopy, flexible,                            transoral; with removal of foreign body(s)                           43236, 60, Esophagogastroduodenoscopy, flexible,                            transoral; with directed submucosal injection(s),                            any substance Diagnosis Code(s):        --- Professional ---                           [id number], Food in esophagus causing other injury,                            initial encounter                           Q39.6, Congenital diverticulum of esophagus                           K22.2, Esophageal obstruction                           R13.10, Dysphagia, unspecified CPT copyright 2019 American [employer] Association. All  rights reserved. The codes documented in this report are preliminary and upon coder review may  be revised to meet current compliance requirements. Gerrit Heck, MD 08/26/2019 5:11:04 PM Number of Addenda: 0

## 2019-08-26 NOTE — Anesthesia Preprocedure Evaluation (Addendum)
Anesthesia Evaluation  Patient identified by MRN, date of birth, ID band Patient awake    Reviewed: Allergy & Precautions, NPO status , Patient's Chart, lab work & pertinent test results  Airway Mallampati: I  TM Distance: >3 FB Neck ROM: Full    Dental  (+) Upper Dentures, Lower Dentures   Pulmonary sleep apnea , COPD, former smoker,    Pulmonary exam normal        Cardiovascular hypertension, Pt. on medications + Peripheral Vascular Disease  Normal cardiovascular exam+ Valvular Problems/Murmurs (s/p TAVR 2019) AS      Neuro/Psych Dementia Bilateral carotid stenosis    GI/Hepatic Neg liver ROS, Achalasia with food impaction   Endo/Other  diabetesHypothyroidism   Renal/GU negative Renal ROS  negative genitourinary   Musculoskeletal negative musculoskeletal ROS (+)   Abdominal   Peds  Hematology negative hematology ROS (+)   Anesthesia Other Findings Hx of achaalsia  Reproductive/Obstetrics                           Anesthesia Physical  Anesthesia Plan  ASA: III and emergent  Anesthesia Plan: General   Post-op Pain Management:    Induction: Intravenous, Rapid sequence and Cricoid pressure planned  PONV Risk Score and Plan: 2 and Treatment may vary due to age or medical condition, Ondansetron and Dexamethasone  Airway Management Planned: Oral ETT  Additional Equipment: None  Intra-op Plan:   Post-operative Plan: Extubation in OR  Informed Consent: I have reviewed the patients History and Physical, chart, labs and discussed the procedure including the risks, benefits and alternatives for the proposed anesthesia with the patient or authorized representative who has indicated his/her understanding and acceptance.     Dental advisory given  Plan Discussed with:   Anesthesia Plan Comments:       Anesthesia Quick Evaluation

## 2019-08-26 NOTE — ED Provider Notes (Signed)
Patient has come back from endoscopy.  Food impaction resolved.  We will add Carafate and Protonix as per GI note.  Otherwise appears stable for discharge home.   Sherwood Gambler, MD 08/26/19 1754

## 2019-08-26 NOTE — ED Notes (Signed)
XR at bedside

## 2019-08-26 NOTE — Telephone Encounter (Signed)
-----   Message from Loralie Champagne, PA-C sent at 08/25/2019  5:16 PM EST ----- I spoke with Dr. Silverio Decamp at 515 pm this evening.  Her recommendation was for the patient to go to the hospital and be admitted for IV hydration, etc. and we can see him in consult and proceed with EGD at that time.  Appears that he will continue to get weaker over the next several days while we are waiting to try to get him in for an outpatient procedure at the hospital.  I am going to let you contact him tomorrow morning while you are working with her in clinic so that way she is there to speak with him as well if needed.  She had said that if he we did not send him tonight then he should go tomorrow so I think will be fine for you to contact him in the morning.  Thank you for your help!  Jess

## 2019-08-26 NOTE — ED Provider Notes (Signed)
[facility] DEPT Provider Note   CSN: [id number] Arrival date & time: 08/26/19  1230     History Chief Complaint  Patient presents with  . Dysphagia  . Foreign Body    Robert Schaefer is a 79 y.o. male.  Patient presents with vomiting.  Patient has a history of a food impaction.  He was seen by GI yesterday and they want to do endoscopies today  The history is provided by the patient. No language interpreter was used.  Foreign Body Location:  Swallowed Suspected object:  Food Pain quality:  Aching Pain severity:  Mild Timing:  Constant Progression:  Worsening Chronicity:  Recurrent Worsened by:  Nothing Ineffective treatments:  None tried Associated symptoms: vomiting   Associated symptoms: no abdominal pain, no congestion, no cough and no ear discharge        Past Medical History:  Diagnosis Date  . AAA (abdominal aortic aneurysm) ([place])    no longer monitoring was small area  . Achalasia   . Carotid stenosis, asymptomatic, bilateral    per duplex --- bilateral <50% ICA  . COPD (chronic obstructive pulmonary disease) ([place])    pulmologist-  dr [name]  . Diabetes mellitus without complication (HCC)    diet controlled  . GERD (gastroesophageal reflux disease)   . Hyperlipidemia   . Hypertension   . Hypothyroidism   . Lesion of left lung    followed by dr [name] (pulmologist)  post infection scarring  . Mild dementia ([place])   . OSA (obstructive sleep apnea)    has a cpap-  intolerant; due to Claustaphobia (01/25/2018)  . Peripheral vascular disease [facility])    cardiologist-- dr berry-- per last ABI/Aorta/Iliac 06-09-2016  patent bilateral iliac kissing stents, >50% stenosis bilateral iliac artery (R>L) and left external iliac artery, patent IVC  . Prostate cancer [facility])    urologist-  dr [name] oncologist-  dr [name]. Dx'd 2018 s/p XRT  . RLS (restless legs syndrome)   . S/P TAVR (transcatheter aortic valve replacement) 02/2018   s/p TAVR  via transapical approach with an Edwards Sapien 3 THV (size 26 mm, model # [id number], serial # [id number])  . Schatzki's ring   . Severe aortic stenosis     Patient Active Problem List   Diagnosis Date Noted  . Sinus bradycardia 05/24/2019  . Abnormal glucose 12/09/2018  . Esophageal diverticular disease   . Loss of weight   . Severe protein-calorie malnutrition ([place])   . Paroxysmal atrial fibrillation ([place]) 06/15/2018  . S/P TAVR (transcatheter aortic valve replacement) 03/16/2018  . Prostate cancer ([place])   . OSA (obstructive sleep apnea)   . Achalasia   . Severe aortic stenosis   . Esophageal dysphagia   . Esophageal stricture   . Cavitating mass in left upper lung lobe 08/12/2016  . PVD (peripheral vascular disease) ([place]) 05/31/2016  . Hypothyroidism 11/07/2014  . BPH w/LUTS 11/07/2014  . Medication management 04/11/2014  . Hyperlipidemia, mixed   . RLS (restless legs syndrome)   . Essential hypertension 09/28/2009  . GERD 09/28/2009    Past Surgical History:  Procedure Laterality Date  . ABDOMINAL AORTOGRAM N/A 01/25/2018   Procedure: ABDOMINAL AORTOGRAM;  Surgeon: [name], MD;  Location: [place] CV LAB;  Service: Cardiovascular;  Laterality: N/A;  . ANAL FISTULECTOMY  2010  . BALLOON DILATION N/A 11/17/2018   Procedure: BALLOON DILATION;  Surgeon: [name], MD;  Location: [facility];  Service: Endoscopy;  Laterality: N/A;  .  CARDIAC CATHETERIZATION Bilateral 01/25/2018  . CARDIOVASCULAR STRESS TEST  04-23-2016   dr berry   normal nuclear study w/ no ischemia/  normal LV function and wall function, nuclear stress ef 57%  . CATARACT EXTRACTION W/ INTRAOCULAR LENS  IMPLANT, BILATERAL Bilateral   . COLONOSCOPY    . ESOPHAGEAL MANOMETRY N/A 10/12/2017   Procedure: ESOPHAGEAL MANOMETRY (EM);  Surgeon: [name], MD;  [facility]: [facility];  Service: Gastroenterology;  Laterality: N/A;  . ESOPHAGOGASTRODUODENOSCOPY N/A 10/12/2017   Procedure:  ESOPHAGOGASTRODUODENOSCOPY (EGD);  Surgeon: [name], MD;  Location: [facility];  Service: Gastroenterology;  Laterality: N/A;  . ESOPHAGOGASTRODUODENOSCOPY (EGD) WITH ESOPHAGEAL DILATION  last one 11-09-2015  dr Yolanda Bonine   dilation schatzki's ring  . ESOPHAGOGASTRODUODENOSCOPY (EGD) WITH PROPOFOL N/A 11/17/2018   Procedure: ESOPHAGOGASTRODUODENOSCOPY;  Surgeon: [name], MD;  [facility]: [facility];  Service: Endoscopy;  Laterality: N/A;  . FOREIGN BODY REMOVAL  11/17/2018   Procedure: FOREIGN BODY REMOVAL;  Surgeon: [name], MD;  Location: [facility];  Service: Endoscopy;;  . GOLD SEED IMPLANT N/A 04/29/2017   Procedure: GOLD SEED IMPLANT;  Surgeon: [name], MD;  Location: [facility];  Service: Urology;  Laterality: N/A;  . HERNIA REPAIR    . INGUINAL HERNIA REPAIR Right 02/23/2014   Procedure: HERNIA REPAIR INGUINAL ADULT;  Surgeon: Joyice Faster. Cornett, MD;  Location: [place];  Service: General;  Laterality: Right;  . INSERTION OF MESH Right 02/23/2014   Procedure: INSERTION OF MESH;  Surgeon: Joyice Faster. Cornett, MD;  Location: [place];  Service: General;  Laterality: Right;  . PERIPHERAL VASCULAR CATHETERIZATION N/A 05/29/2016   Procedure: Lower Extremity Angiography;  Surgeon: [name], MD;  Location: [place] CV LAB;  Service: Cardiovascular;  Laterality: N/A; [place] orbital rotational atherectomy, PTA and stenting bilateral common iliac arteries   . PILONIDAL CYST / SINUS EXCISION  2000  . PROSTATE BIOPSY  09-22-2016  dr [name] office  . RIGHT/LEFT HEART CATH AND CORONARY ANGIOGRAPHY N/A 01/25/2018   Procedure: RIGHT/LEFT HEART CATH AND CORONARY ANGIOGRAPHY;  Surgeon: [name], MD;  Location: [place] [facility];  Service: Cardiovascular;  Laterality: N/A;  . SAVORY DILATION N/A 10/12/2017   Procedure: SAVORY DILATION;  Surgeon: [name], MD;  [facility];   Service: Gastroenterology;  Laterality: N/A;  . SAVORY DILATION N/A 11/17/2018   Procedure: SAVORY DILATION;  Surgeon: [name], MD;  [facility];  Service: Endoscopy;  Laterality: N/A;  . SPACE OAR INSTILLATION N/A 04/29/2017   Procedure: SPACE OAR INSTILLATION;  Surgeon: [name], MD;  Location: [facility];  Service: Urology;  Laterality: N/A;  . TRANSCATHETER AORTIC VALVE REPLACEMENT, TRANSAPICAL Left 03/16/2018   Procedure: TRANSCATHETER AORTIC VALVE REPLACEMENT, TRANSAPICAL;  Surgeon: Sherren Mocha, MD;  Location: [place];  Service: Open Heart Surgery;  Laterality: Left;  . TRANSTHORACIC ECHOCARDIOGRAM  05-05-2016   dr berry   ef 0000000,  grade 1diastolic dysfunction/  moderate AV stenosis with mod. AR (right coronary and noncoronary cusp mobility seversly restricted);  valve area 1.25cm^2, mean gradient 27 mmHg, peak gradient 91mmHg/  mild MR  . UPPER GASTROINTESTINAL ENDOSCOPY         Family History  Problem Relation Age of Onset  . Heart attack Mother   . Stroke Father   . Asthma Father   . Cancer Neg Hx   . Colon cancer Neg Hx   . Stomach cancer Neg Hx   .  Esophageal cancer Neg Hx   . Rectal cancer Neg Hx   . Pancreatic cancer Neg Hx   . Prostate cancer Neg Hx     Social History   Tobacco Use  . Smoking status: Former Smoker    Packs/day: 1.00    Years: 50.00    Pack years: 50.00    Types: Cigarettes    Quit date: 07/05/2005    Years since quitting: 14.1  . Smokeless tobacco: Never Used  Substance Use Topics  . Alcohol use: No  . Drug use: Never    Home Medications Prior to Admission medications   Medication Sig Start Date End Date Taking? Authorizing Provider  albuterol (PROVENTIL HFA;VENTOLIN HFA) 108 (90 Base) MCG/ACT inhaler Inhale 1 puff into the lungs every 6 (six) hours as needed for wheezing or shortness of breath.   Yes [provider]  aspirin EC 81 MG tablet Take 81 mg by mouth daily.   Yes [provider]  Cholecalciferol (VITAMIN D-3) 5000 units TABS Take 5,000 Units by mouth daily at 3 pm.   Yes [provider]  feeding supplement, ENSURE ENLIVE, (ENSURE ENLIVE) LIQD Take 237 mLs by mouth 5 (five) times daily.    Yes [provider]  fluticasone (FLONASE) 50 MCG/ACT nasal spray Place 2 sprays into both nostrils daily as needed for allergies or rhinitis.    Yes [provider]  levothyroxine (SYNTHROID) 50 MCG tablet Take 1 tablet daily on an empty stomach with only water for 30 minutes & no Antacid meds, Calcium or Magnesium for 4 hours & avoid Biotin 06/26/19  Yes [name], MD    Allergies    Penicillins, Sulfa antibiotics, and Versed [midazolam]  Review of Systems   Review of Systems  Constitutional: Negative for appetite change and fatigue.  HENT: Negative for congestion, ear discharge and sinus pressure.   Eyes: Negative for discharge.  Respiratory: Negative for cough.   Cardiovascular: Negative for chest pain.  Gastrointestinal: Positive for vomiting. Negative for abdominal pain and diarrhea.  Genitourinary: Negative for frequency and hematuria.  Musculoskeletal: Negative for back pain.  Skin: Negative for rash.  Neurological: Negative for seizures and headaches.  Psychiatric/Behavioral: Negative for hallucinations.    Physical Exam Updated Vital Signs BP (!) 158/56   Pulse 66   Temp 98 F (36.7 C) (Oral)   Resp 15   Ht 5\' 5"  (1.651 m)   Wt 54.3 kg   SpO2 98%   BMI 19.94 kg/m   Physical Exam Vitals and nursing note reviewed.  Constitutional:      Appearance: He is well-developed.  HENT:     Head: Normocephalic.     Nose: Nose normal.  Eyes:     General: No scleral icterus.    Conjunctiva/sclera: Conjunctivae normal.  Neck:     Thyroid: No thyromegaly.  Cardiovascular:     Rate and Rhythm: Normal rate and regular rhythm.     Heart sounds: No murmur. No friction rub. No gallop.   Pulmonary:     Breath sounds:  No stridor. No wheezing or rales.  Chest:     Chest wall: No tenderness.  Abdominal:     General: There is no distension.     Tenderness: There is no abdominal tenderness. There is no rebound.  Musculoskeletal:        General: Normal range of motion.     Cervical back: Neck supple.  Lymphadenopathy:     Cervical: No cervical adenopathy.  Skin:  Findings: No erythema or rash.  Neurological:     Mental Status: He is alert and oriented to person, place, and time.     Motor: No abnormal muscle tone.     Coordination: Coordination normal.  Psychiatric:        Behavior: Behavior normal.     ED Results / Procedures / Treatments   Labs (all labs ordered are listed, but only abnormal results are displayed) Labs Reviewed  CBC WITH DIFFERENTIAL/PLATELET - Abnormal; Notable for the following components:      Result Value   WBC 3.9 (*)    RBC 3.63 (*)    Hemoglobin 11.8 (*)    HCT 35.5 (*)    Platelets 146 (*)    All other components within normal limits  COMPREHENSIVE METABOLIC PANEL - Abnormal; Notable for the following components:   Glucose, Bld 114 (*)    AST 14 (*)    All other components within normal limits  RESPIRATORY PANEL BY RT PCR (FLU A&B, COVID)    EKG None  Radiology DG Chest Port 1 View  Result Date: 08/26/2019 CLINICAL DATA:  Shortness of breath and dysphagia EXAM: PORTABLE CHEST 1 VIEW COMPARISON:  September 02, 2018 FINDINGS: Lungs are clear. Heart is upper normal in size with pulmonary vascularity normal. Patient is status post aortic valve replacement. No adenopathy. There is aortic atherosclerosis. No pneumothorax or pneumomediastinum. Bones are osteoporotic. IMPRESSION: Lungs clear. No pneumomediastinum or pneumothorax. Stable cardiac silhouette. Status post aortic valve replacement. Aortic Atherosclerosis (ICD10-I70.0). Bones osteoporotic. Electronically Signed   By: Lowella Grip III M.D.   On: 08/26/2019 13:28    Procedures Procedures (including  critical care time)  Medications Ordered in ED Medications  sodium chloride 0.9 % bolus 500 mL (0 mLs Intravenous Stopped 08/26/19 1453)  glucagon (human recombinant) (GLUCAGEN) injection 1 mg (1 mg Intravenous Given 08/26/19 1345)    ED Course  I have reviewed the triage vital signs and the nursing notes.  Pertinent labs & imaging results that were available during my care of the patient were reviewed by me and considered in my medical decision making (see chart for details).    MDM Rules/Calculators/A&P                      Patient with food impaction and esophagus.  No help with glucagon.  GI to see patient and do endoscopy Final Clinical Impression(s) / ED Diagnoses Final diagnoses:  Esophageal obstruction due to food impaction    Rx / DC Orders ED Discharge Orders    None       Milton Ferguson, MD 08/26/19 1517

## 2019-08-26 NOTE — Telephone Encounter (Signed)
Spoke with patient's daughter and they are heading to the ER now.

## 2019-08-26 NOTE — Interval H&P Note (Signed)
History and Physical Interval Note:  08/26/2019 3:43 PM  Robert Schaefer  has presented today for surgery, with the diagnosis of Achalasia/food impaction.  The various methods of treatment have been discussed with the patient and family. After consideration of risks, benefits and other options for treatment, the patient has consented to  Procedure(s): ESOPHAGOGASTRODUODENOSCOPY (EGD) WITH PROPOFOL (N/A) as a surgical intervention.  The patient's history has been reviewed, patient examined, no change in status, stable for surgery.  I have reviewed the patient's chart and labs.  Questions were answered to the patient's satisfaction.     [name] [name]

## 2019-08-28 ENCOUNTER — Encounter: Payer: Self-pay | Admitting: *Deleted

## 2019-08-29 ENCOUNTER — Telehealth: Payer: Self-pay | Admitting: Gastroenterology

## 2019-08-29 NOTE — Telephone Encounter (Signed)
Pt's daughter needs to clarify some medications that pt was prescribed after his EGD last Friday. She states that he was prescribed pantoprazole and pt already takes omeprazole so she wants to make sure that he needs to take both.

## 2019-08-29 NOTE — Telephone Encounter (Signed)
The pt's daughter has been advised that the pt only needs to take protonix twice daily.  Omeprazole is not needed at this time.  Per report the pt is to follow up with Dr Silverio Decamp in 2-3 weeks.  The pt daughter will call back to make appt after she is home with the pt.

## 2019-08-30 ENCOUNTER — Telehealth: Payer: Self-pay

## 2019-08-30 ENCOUNTER — Other Ambulatory Visit: Payer: Self-pay

## 2019-08-30 NOTE — Telephone Encounter (Signed)
Left message for patient to call back to the office;   Patient needs to be scheduled for a f/u OV  1-2 wk f/u post hosp EGD-dysphagia/food impaction/Jessica/Nandigam

## 2019-08-30 NOTE — Telephone Encounter (Signed)
Ok , please block 90 min for the procedure. Thank you

## 2019-08-30 NOTE — Telephone Encounter (Signed)
The patient's daughter is the transportation and coordinator of the medical care. She is also a Pharmacist, hospital. She has to give a couple of weeks notice in order to help with this. I have arranged for 09/20/19 if that is okay. Review the schedule and please advise. Thanks.

## 2019-08-30 NOTE — Telephone Encounter (Signed)
[name], MD sent to Greggory Keen, LPN  Beth, Can you please schedule him for EGD with esophageal dilation under fluoroscopy and general anesthesia with intubation at [facility] long endoscopy unit anytime that is available during the week of March 8. He will be priority 1 for the case request.I will be doing inpatient that week.    If patient is not willing to proceed with the procedure at this point, please schedule office visit to discuss this further.  Thank you  VN

## 2019-08-31 ENCOUNTER — Other Ambulatory Visit: Payer: Self-pay

## 2019-08-31 DIAGNOSIS — K222 Esophageal obstruction: Secondary | ICD-10-CM

## 2019-08-31 DIAGNOSIS — K220 Achalasia of cardia: Secondary | ICD-10-CM

## 2019-08-31 DIAGNOSIS — E43 Unspecified severe protein-calorie malnutrition: Secondary | ICD-10-CM

## 2019-09-16 ENCOUNTER — Encounter (HOSPITAL_COMMUNITY): Payer: Self-pay | Admitting: Gastroenterology

## 2019-09-16 ENCOUNTER — Other Ambulatory Visit (HOSPITAL_COMMUNITY)
Admission: RE | Admit: 2019-09-16 | Discharge: 2019-09-16 | Disposition: A | Discharge status: Home / Self Care | Payer: Medicare Other | Source: Ambulatory Visit | Attending: Gastroenterology | Admitting: Gastroenterology

## 2019-09-16 DIAGNOSIS — Z20822 Contact with and (suspected) exposure to covid-19: Secondary | ICD-10-CM | POA: Diagnosis not present

## 2019-09-16 DIAGNOSIS — Z01812 Encounter for preprocedural laboratory examination: Secondary | ICD-10-CM | POA: Insufficient documentation

## 2019-09-16 NOTE — Progress Notes (Signed)
Attempted to obtain medical history via telephone, unable to reach at this time. I left a voicemail to return pre surgical testing department's phone call.  

## 2019-09-17 LAB — SARS CORONAVIRUS 2 (TAT 6-24 HRS): SARS Coronavirus 2: NEGATIVE

## 2019-09-20 ENCOUNTER — Ambulatory Visit (HOSPITAL_COMMUNITY)
Admission: RE | Admit: 2019-09-20 | Discharge: 2019-09-20 | Disposition: A | Discharge status: Home / Self Care | Payer: Medicare Other | Attending: Gastroenterology | Admitting: Gastroenterology

## 2019-09-20 ENCOUNTER — Encounter (HOSPITAL_COMMUNITY)
Admission: RE | Disposition: A | Discharge status: Home / Self Care | Payer: Self-pay | Source: Home / Self Care | Attending: Gastroenterology

## 2019-09-20 ENCOUNTER — Ambulatory Visit (HOSPITAL_COMMUNITY): Payer: Medicare Other | Admitting: Anesthesiology

## 2019-09-20 ENCOUNTER — Other Ambulatory Visit: Payer: Self-pay

## 2019-09-20 ENCOUNTER — Encounter (HOSPITAL_COMMUNITY): Payer: Self-pay | Admitting: Gastroenterology

## 2019-09-20 DIAGNOSIS — K222 Esophageal obstruction: Secondary | ICD-10-CM

## 2019-09-20 DIAGNOSIS — T18128A Food in esophagus causing other injury, initial encounter: Secondary | ICD-10-CM

## 2019-09-20 DIAGNOSIS — I6523 Occlusion and stenosis of bilateral carotid arteries: Secondary | ICD-10-CM | POA: Insufficient documentation

## 2019-09-20 DIAGNOSIS — Z79899 Other long term (current) drug therapy: Secondary | ICD-10-CM | POA: Diagnosis not present

## 2019-09-20 DIAGNOSIS — R1310 Dysphagia, unspecified: Secondary | ICD-10-CM

## 2019-09-20 DIAGNOSIS — Q396 Congenital diverticulum of esophagus: Secondary | ICD-10-CM | POA: Diagnosis not present

## 2019-09-20 DIAGNOSIS — G2581 Restless legs syndrome: Secondary | ICD-10-CM | POA: Diagnosis not present

## 2019-09-20 DIAGNOSIS — K449 Diaphragmatic hernia without obstruction or gangrene: Secondary | ICD-10-CM | POA: Insufficient documentation

## 2019-09-20 DIAGNOSIS — K228 Other specified diseases of esophagus: Secondary | ICD-10-CM | POA: Diagnosis not present

## 2019-09-20 DIAGNOSIS — Z8249 Family history of ischemic heart disease and other diseases of the circulatory system: Secondary | ICD-10-CM | POA: Insufficient documentation

## 2019-09-20 DIAGNOSIS — Z7982 Long term (current) use of aspirin: Secondary | ICD-10-CM | POA: Insufficient documentation

## 2019-09-20 DIAGNOSIS — F0390 Unspecified dementia without behavioral disturbance: Secondary | ICD-10-CM | POA: Insufficient documentation

## 2019-09-20 DIAGNOSIS — E039 Hypothyroidism, unspecified: Secondary | ICD-10-CM | POA: Insufficient documentation

## 2019-09-20 DIAGNOSIS — E785 Hyperlipidemia, unspecified: Secondary | ICD-10-CM | POA: Diagnosis not present

## 2019-09-20 DIAGNOSIS — K219 Gastro-esophageal reflux disease without esophagitis: Secondary | ICD-10-CM | POA: Insufficient documentation

## 2019-09-20 DIAGNOSIS — J449 Chronic obstructive pulmonary disease, unspecified: Secondary | ICD-10-CM | POA: Diagnosis not present

## 2019-09-20 DIAGNOSIS — Z87891 Personal history of nicotine dependence: Secondary | ICD-10-CM | POA: Insufficient documentation

## 2019-09-20 DIAGNOSIS — Z825 Family history of asthma and other chronic lower respiratory diseases: Secondary | ICD-10-CM | POA: Insufficient documentation

## 2019-09-20 DIAGNOSIS — I350 Nonrheumatic aortic (valve) stenosis: Secondary | ICD-10-CM | POA: Insufficient documentation

## 2019-09-20 DIAGNOSIS — G4733 Obstructive sleep apnea (adult) (pediatric): Secondary | ICD-10-CM | POA: Insufficient documentation

## 2019-09-20 DIAGNOSIS — Z952 Presence of prosthetic heart valve: Secondary | ICD-10-CM | POA: Insufficient documentation

## 2019-09-20 DIAGNOSIS — E1151 Type 2 diabetes mellitus with diabetic peripheral angiopathy without gangrene: Secondary | ICD-10-CM | POA: Insufficient documentation

## 2019-09-20 DIAGNOSIS — Z8546 Personal history of malignant neoplasm of prostate: Secondary | ICD-10-CM | POA: Insufficient documentation

## 2019-09-20 DIAGNOSIS — I714 Abdominal aortic aneurysm, without rupture: Secondary | ICD-10-CM | POA: Diagnosis not present

## 2019-09-20 DIAGNOSIS — I10 Essential (primary) hypertension: Secondary | ICD-10-CM | POA: Diagnosis not present

## 2019-09-20 DIAGNOSIS — Z79890 Hormone replacement therapy (postmenopausal): Secondary | ICD-10-CM | POA: Insufficient documentation

## 2019-09-20 HISTORY — DX: Presence of dental prosthetic device (complete) (partial): Z97.2

## 2019-09-20 HISTORY — PX: FOREIGN BODY REMOVAL: SHX962

## 2019-09-20 HISTORY — PX: ESOPHAGOGASTRODUODENOSCOPY (EGD) WITH PROPOFOL: SHX5813

## 2019-09-20 HISTORY — PX: BALLOON DILATION: SHX5330

## 2019-09-20 SURGERY — ESOPHAGOGASTRODUODENOSCOPY (EGD) WITH PROPOFOL
Anesthesia: General

## 2019-09-20 MED ORDER — ONDANSETRON HCL 4 MG/2ML IJ SOLN
INTRAMUSCULAR | Status: DC | PRN
  Administered 2019-09-20: 4 mg via INTRAVENOUS

## 2019-09-20 MED ORDER — PROPOFOL 10 MG/ML IV BOLUS
INTRAVENOUS | Status: DC | PRN
  Administered 2019-09-20: 100 ug/kg/min via INTRAVENOUS
  Administered 2019-09-20: 30 mg via INTRAVENOUS

## 2019-09-20 MED ORDER — LIDOCAINE 2% (20 MG/ML) 5 ML SYRINGE
INTRAMUSCULAR | Status: DC | PRN
  Administered 2019-09-20: 60 mg via INTRAVENOUS

## 2019-09-20 MED ORDER — SUCRALFATE 1 G PO TABS: 1.0000 g | ORAL_TABLET | Freq: Four times a day (QID) | ORAL | 1 refills | Status: DC

## 2019-09-20 MED ORDER — PANTOPRAZOLE SODIUM 40 MG PO TBEC: 40.0000 mg | DELAYED_RELEASE_TABLET | Freq: Two times a day (BID) | ORAL | 11 refills | Status: DC

## 2019-09-20 MED ORDER — LACTATED RINGERS IV SOLN: INTRAVENOUS | Status: DC

## 2019-09-20 MED ORDER — GLYCOPYRROLATE PF 0.2 MG/ML IJ SOSY
PREFILLED_SYRINGE | INTRAMUSCULAR | Status: DC | PRN
  Administered 2019-09-20 (×2): .1 mg via INTRAVENOUS

## 2019-09-20 MED ORDER — SODIUM CHLORIDE 0.9 % IV SOLN: INTRAVENOUS | Status: DC

## 2019-09-20 MED ORDER — PROPOFOL 1000 MG/100ML IV EMUL
INTRAVENOUS | Status: AC
  Filled 2019-09-20: qty 100

## 2019-09-20 SURGICAL SUPPLY — 15 items

## 2019-09-20 NOTE — Transfer of Care (Signed)
Immediate Anesthesia Transfer of Care Note  Patient: Robert Schaefer  Procedure(s) Performed: Procedure(s) with comments: ESOPHAGOGASTRODUODENOSCOPY (EGD) WITH PROPOFOL (N/A) - with intubation fluoro needed BALLOON DILATION (N/A) - intubation  use fluoroscopy  Patient Location: PACU  Anesthesia Type:MAC  Level of Consciousness:  sedated, patient cooperative and responds to stimulation  Airway & Oxygen Therapy:Patient Spontanous Breathing and Patient connected to face mask oxgen  Post-op Assessment:  Report given to PACU RN and Post -op Vital signs reviewed and stable  Post vital signs:  Reviewed and stable  Last Vitals:  Vitals:   09/20/19 1035  BP: (!) 170/58  Pulse: 61  Resp: 15  Temp: 36.9 C  SpO2: 60%    Complications: No apparent anesthesia complications

## 2019-09-20 NOTE — Anesthesia Postprocedure Evaluation (Signed)
Anesthesia Post Note  Patient: Robert Schaefer  Procedure(s) Performed: ESOPHAGOGASTRODUODENOSCOPY (EGD) WITH PROPOFOL (N/A ) BALLOON DILATION (N/A )     Patient location during evaluation: PACU Anesthesia Type: MAC Level of consciousness: awake and alert, oriented and patient cooperative Pain management: pain level controlled Vital Signs Assessment: post-procedure vital signs reviewed and stable Respiratory status: spontaneous breathing, nonlabored ventilation and respiratory function stable Cardiovascular status: blood pressure returned to baseline and stable Postop Assessment: no apparent nausea or vomiting Anesthetic complications: no    Last Vitals:  Vitals:   09/20/19 1158 09/20/19 1200  BP: (!) 99/44 (!) 113/49  Pulse: (!) 56   Resp:    Temp: (!) 36 C   SpO2: 100%     Last Pain:  Vitals:   09/20/19 1158  TempSrc: Temporal  PainSc: 0-No pain                 Pervis Hocking

## 2019-09-20 NOTE — Op Note (Signed)
[facility] Patient Name: Robert Schaefer Procedure Date: 09/20/2019 MRN: [id number] Attending MD: [name] , MD Date of Birth: [birth year] CSN: [id number] Age: 79 Admit Type: Outpatient Procedure:                Upper GI endoscopy Indications:              Dysphagia, For therapy of esophageal stricture Providers:                [name], MD, Cleda Daub, RN, Theodora Blow, Technician Referring MD:              Medicines:                Monitored Anesthesia Care Complications:            No immediate complications. Estimated Blood Loss:     Estimated blood loss was minimal. Procedure:                Pre-Anesthesia Assessment:                           - Prior to the procedure, a History and Physical                            was performed, and patient medications and                            allergies were reviewed. The patient's tolerance of                            previous anesthesia was also reviewed. The risks                            and benefits of the procedure and the sedation                            options and risks were discussed with the patient.                            All questions were answered, and informed consent                            was obtained. Prior Anticoagulants: The patient has                            taken no previous anticoagulant or antiplatelet                            agents. ASA Grade Assessment: III - A patient with                            severe systemic disease. After reviewing the risks  and benefits, the patient was deemed in                            satisfactory condition to undergo the procedure.                           After obtaining informed consent, the endoscope was                            passed under direct vision. Throughout the                            procedure, the patient's blood pressure, pulse, and                 oxygen saturations were monitored continuously. The                            [id number] Olympus gastroscope was                            introduced through the mouth, and advanced to the                            second part of duodenum. The upper GI endoscopy was                            accomplished without difficulty. The patient                            tolerated the procedure well. Scope In: Scope Out: Findings:      The lumen of the lower third of the esophagus was moderately dilated.      A non-bleeding diverticulum with a large opening and no stigmata of       recent bleeding was found in the lower third of the esophagus.      Food was found in the lower third of the esophagus in the diverticula.       Removal of food was accomplished with Jabier Mutton net.      One benign-appearing, intrinsic moderate (circumferential scarring or       stenosis; an endoscope may pass) stenosis was found 38 to 40 cm from the       incisors. This stenosis measured 1.6 cm (inner diameter) x 2 cm (in       length). The stenosis was traversed. A TTS dilator was passed through       the scope. Dilation with an 18-19-20 mm x 8 cm CRE balloon dilator was       performed to 20 mm.      A small hiatal hernia was present.      The cardia and gastric fundus were normal on retroflexion.      The examined duodenum was normal. Impression:               - Dilation in the lower third of the esophagus.                           - Diverticulum in  the lower third of the esophagus.                           - Food in the lower third of the esophagus. Removal                            was successful.                           - Benign-appearing esophageal stenosis. Dilated.                           - Small hiatal hernia.                           - Normal examined duodenum. Moderate Sedation:      Not Applicable - Patient had care per Anesthesia. Recommendation:           - Patient has a  contact number available for                            emergencies. The signs and symptoms of potential                            delayed complications were discussed with the                            patient. Return to normal activities tomorrow.                            Written discharge instructions were provided to the                            patient.                           - Pureed soft diet.                           - Continue present medications.                           - Protonix 40mg  before breakfast and dinner twice                            daily                           - Sucralfate before meals and bedtime as needed                           - Return to GI office at the next available                            appointment. Procedure Code(s):        --- Professional ---  [phone], Esophagogastroduodenoscopy, flexible,                            transoral; with removal of foreign body(s)                           [id number], Esophagogastroduodenoscopy, flexible,                            transoral; with transendoscopic balloon dilation of                            esophagus (less than 30 mm diameter) Diagnosis Code(s):        --- Professional ---                           K22.8, Other specified diseases of esophagus                           Q39.6, Congenital diverticulum of esophagus                           T18.128A, Food in esophagus causing other injury,                            initial encounter                           K22.2, Esophageal obstruction                           K44.9, Diaphragmatic hernia without obstruction or                            gangrene                           R13.10, Dysphagia, unspecified CPT copyright 2019 [employer]. All rights reserved. The codes documented in this report are preliminary and upon coder review may  be revised to meet current compliance requirements. [name], MD 09/20/2019 12:04:31 PM This report has been signed electronically. Number of Addenda: 0

## 2019-09-20 NOTE — Discharge Instructions (Signed)
YOU HAD AN ENDOSCOPIC PROCEDURE TODAY: Refer to the procedure report and other information in the discharge instructions given to you for any specific questions about what was found during the examination. If this information does not answer your questions, please call [place] office at [phone] to clarify.  ° °YOU SHOULD EXPECT: Some feelings of bloating in the abdomen. Passage of more gas than usual. Walking can help get rid of the air that was put into your GI tract during the procedure and reduce the bloating. If you had a lower endoscopy (such as a colonoscopy or flexible sigmoidoscopy) you may notice spotting of blood in your stool or on the toilet paper. Some abdominal soreness may be present for a day or two, also. ° °DIET: Your first meal following the procedure should be a light meal and then it is ok to progress to your normal diet. A half-sandwich or bowl of soup is an example of a good first meal. Heavy or fried foods are harder to digest and may make you feel nauseous or bloated. Drink plenty of fluids but you should avoid alcoholic beverages for 24 hours. If you had a esophageal dilation, please see attached instructions for diet.   ° °ACTIVITY: Your care partner should take you home directly after the procedure. You should plan to take it easy, moving slowly for the rest of the day. You can resume normal activity the day after the procedure however YOU SHOULD NOT DRIVE, use power tools, machinery or perform tasks that involve climbing or major physical exertion for 24 hours (because of the sedation medicines used during the test).  ° °SYMPTOMS TO REPORT IMMEDIATELY: °A gastroenterologist can be reached at any hour. Please call [phone]  for any of the following symptoms:  °Following lower endoscopy (colonoscopy, flexible sigmoidoscopy) °Excessive amounts of blood in the stool  °Significant tenderness, worsening of abdominal pains  °Swelling of the abdomen that is new, acute  °Fever of 100° or  higher  °Following upper endoscopy (EGD, EUS, ERCP, esophageal dilation) °Vomiting of blood or coffee ground material  °New, significant abdominal pain  °New, significant chest pain or pain under the shoulder blades  °Painful or persistently difficult swallowing  °New shortness of breath  °Black, tarry-looking or red, bloody stools ° °FOLLOW UP:  °If any biopsies were taken you will be contacted by phone or by letter within the next 1-3 weeks. Call [phone]  if you have not heard about the biopsies in 3 weeks.  °Please also call with any specific questions about appointments or follow up tests. ° °

## 2019-09-20 NOTE — Interval H&P Note (Signed)
History and Physical Interval Note:  09/20/2019 10:12 AM  Robert Schaefer  has presented today for surgery, with the diagnosis of ESOPHAGEAL STRICTURE.  The various methods of treatment have been discussed with the patient and family. After consideration of risks, benefits and other options for treatment, the patient has consented to  Procedure(s) with comments: ESOPHAGOGASTRODUODENOSCOPY (EGD) WITH PROPOFOL (N/A) - with intubation fluoro needed BALLOON DILATION (N/A) - intubation  use fluoroscopy as a surgical intervention.  The patient's history has been reviewed, patient examined, no change in status, stable for surgery.  I have reviewed the patient's chart and labs.  Questions were answered to the patient's satisfaction.     Kavitha Nandigam

## 2019-09-20 NOTE — Anesthesia Preprocedure Evaluation (Addendum)
Anesthesia Evaluation  Patient identified by MRN, date of birth, ID band Patient awake    Reviewed: Allergy & Precautions, NPO status , Patient's Chart, lab work & pertinent test results  Airway Mallampati: I  TM Distance: >3 FB Neck ROM: Full    Dental  (+) Upper Dentures, Lower Dentures   Pulmonary sleep apnea , neg pneumonia , COPD, former smoker,  Noncompliant with CPAP   Former smoker, quit 2007, 50 pack year history    Pulmonary exam normal breath sounds clear to auscultation       Cardiovascular hypertension, Pt. on medications + Peripheral Vascular Disease and +CHF  Normal cardiovascular exam+ Valvular Problems/Murmurs (s/p TAVR 2019, mild-mod paravalvular leak on last echo 03/2019) AS  Rhythm:Regular Rate:Normal  Severe AS s/p TAVR 02/2018  Last echo 03/2019: 1. - TAVR: There is a 26 mm S2 in aortic position. The Vmax is 2.2 m/s. The peak/mean gradients are 15/9 mmHg. Mild to moderate paravalvular leak  is detected in the 3 to 5 o'clock position. The degree of paravalvular leak appears simialr to the prior  echo (04/21/2018). Consider TEE for better quantification of regurgitation if clinically indicated.  2. The left ventricle has normal systolic function with an ejection fraction of 60-65%. The cavity size was normal. There is mild concentric left ventricular hypertrophy. Left ventricular diastolic Doppler parameters are consistent with impaired relaxation.  3. The right ventricle has normal systolic function. The cavity was normal. There is no increase in right ventricular wall thickness. Right ventricular systolic pressure is normal.  4. Mildly thickened tricuspid valve leaflets.  5. The mitral valve is degenerative. Mild thickening of the mitral valve leaflet. Mild calcification of the mitral valve leaflet.  6. The tricuspid valve is grossly normal.  7. A 26 Edwards Sapien bioprosthetic aortic valve (TAVR) valve is  present in the aortic position. Procedure Date: 03/16/2018 Echo findings are consistent with perivalvular leak of the aortic prosthesis.  8. Aortic valve regurgitation is mild to moderate by color flow Doppler.  9. The aorta is normal unless otherwise noted.  10. The aortic root is normal in size and structure.  11. When compared to the prior study: No significant change from prior (04/21/2018).    Neuro/Psych PSYCHIATRIC DISORDERS Dementia Bilateral carotid stenosis, RLS    GI/Hepatic Neg liver ROS, GERD  Medicated and Controlled,Achalasia, esophageal stricture requiring dilation   Endo/Other  diabetesHypothyroidism   Renal/GU negative Renal ROS   Hx prostate ca    Musculoskeletal negative musculoskeletal ROS (+)   Abdominal Normal abdominal exam  (+)   Peds  Hematology negative hematology ROS (+)   Anesthesia Other Findings   Reproductive/Obstetrics negative OB ROS                           Anesthesia Physical Anesthesia Plan  ASA: III  Anesthesia Plan: MAC   Post-op Pain Management:    Induction: Intravenous  PONV Risk Score and Plan: 2 and Treatment may vary due to age or medical condition, Propofol infusion and TIVA  Airway Management Planned: Natural Airway and Nasal Cannula  Additional Equipment: None  Intra-op Plan:   Post-operative Plan:   Informed Consent: I have reviewed the patients History and Physical, chart, labs and discussed the procedure including the risks, benefits and alternatives for the proposed anesthesia with the patient or authorized representative who has indicated his/her understanding and acceptance.       Plan Discussed with: CRNA  Anesthesia Plan  Comments:        Anesthesia Quick Evaluation

## 2019-09-22 ENCOUNTER — Encounter: Payer: Self-pay | Admitting: *Deleted

## 2019-10-03 ENCOUNTER — Ambulatory Visit (INDEPENDENT_AMBULATORY_CARE_PROVIDER_SITE_OTHER): Payer: Medicare Other | Admitting: Adult Health

## 2019-10-03 ENCOUNTER — Other Ambulatory Visit: Payer: Self-pay

## 2019-10-03 ENCOUNTER — Encounter: Payer: Self-pay | Admitting: Adult Health

## 2019-10-03 VITALS — BP 120/64 | HR 59 | Temp 97.5°F | Ht 65.0 in | Wt 126.0 lb

## 2019-10-03 DIAGNOSIS — I480 Paroxysmal atrial fibrillation: Secondary | ICD-10-CM

## 2019-10-03 DIAGNOSIS — C61 Malignant neoplasm of prostate: Secondary | ICD-10-CM | POA: Diagnosis not present

## 2019-10-03 DIAGNOSIS — K219 Gastro-esophageal reflux disease without esophagitis: Secondary | ICD-10-CM | POA: Diagnosis not present

## 2019-10-03 DIAGNOSIS — G4733 Obstructive sleep apnea (adult) (pediatric): Secondary | ICD-10-CM | POA: Diagnosis not present

## 2019-10-03 DIAGNOSIS — R1310 Dysphagia, unspecified: Secondary | ICD-10-CM

## 2019-10-03 DIAGNOSIS — F0390 Unspecified dementia without behavioral disturbance: Secondary | ICD-10-CM

## 2019-10-03 DIAGNOSIS — I739 Peripheral vascular disease, unspecified: Secondary | ICD-10-CM | POA: Diagnosis not present

## 2019-10-03 DIAGNOSIS — E559 Vitamin D deficiency, unspecified: Secondary | ICD-10-CM

## 2019-10-03 DIAGNOSIS — R410 Disorientation, unspecified: Secondary | ICD-10-CM | POA: Diagnosis not present

## 2019-10-03 DIAGNOSIS — N138 Other obstructive and reflux uropathy: Secondary | ICD-10-CM

## 2019-10-03 DIAGNOSIS — J984 Other disorders of lung: Secondary | ICD-10-CM

## 2019-10-03 DIAGNOSIS — E039 Hypothyroidism, unspecified: Secondary | ICD-10-CM

## 2019-10-03 DIAGNOSIS — N401 Enlarged prostate with lower urinary tract symptoms: Secondary | ICD-10-CM

## 2019-10-03 DIAGNOSIS — I700 Atherosclerosis of aorta: Secondary | ICD-10-CM | POA: Diagnosis not present

## 2019-10-03 DIAGNOSIS — Z79899 Other long term (current) drug therapy: Secondary | ICD-10-CM

## 2019-10-03 DIAGNOSIS — R1319 Other dysphagia: Secondary | ICD-10-CM

## 2019-10-03 DIAGNOSIS — E43 Unspecified severe protein-calorie malnutrition: Secondary | ICD-10-CM | POA: Diagnosis not present

## 2019-10-03 DIAGNOSIS — Z0000 Encounter for general adult medical examination without abnormal findings: Secondary | ICD-10-CM

## 2019-10-03 DIAGNOSIS — J449 Chronic obstructive pulmonary disease, unspecified: Secondary | ICD-10-CM

## 2019-10-03 DIAGNOSIS — Z87891 Personal history of nicotine dependence: Secondary | ICD-10-CM

## 2019-10-03 DIAGNOSIS — Z952 Presence of prosthetic heart valve: Secondary | ICD-10-CM

## 2019-10-03 DIAGNOSIS — K222 Esophageal obstruction: Secondary | ICD-10-CM | POA: Diagnosis not present

## 2019-10-03 DIAGNOSIS — R441 Visual hallucinations: Secondary | ICD-10-CM | POA: Diagnosis not present

## 2019-10-03 DIAGNOSIS — R7309 Other abnormal glucose: Secondary | ICD-10-CM

## 2019-10-03 DIAGNOSIS — G2581 Restless legs syndrome: Secondary | ICD-10-CM

## 2019-10-03 DIAGNOSIS — E782 Mixed hyperlipidemia: Secondary | ICD-10-CM

## 2019-10-03 NOTE — Patient Instructions (Signed)
Please get a chest xray tomorrow at [address]  Push fluids    Confusion Confusion is the inability to think with the usual speed or clarity. People who are confused often describe their thinking as cloudy or unclear. Confusion can also include feeling disoriented. This means you are unaware of where you are or who you are. You may also not know the date or time. When confused, you may have difficulty remembering, paying attention, or making decisions. Some people also act aggressively when they are confused. In some cases, confusion may come on quickly. In other cases, it may develop slowly over time. How quickly confusion comes on depends on the cause. Confusion may be caused by:  Head injury (concussion).  Seizures.  Stroke.  Fever.  Brain tumor.  Decrease in brain function due to a vascular or neurologic condition (dementia).  Emotions, like rage or terror.  Inability to know what is real and what is not (hallucinations).  Infections, such as a urinary tract infection (UTI).  Using too much alcohol, drugs, or medicines.  Loss of fluid (dehydration) or an imbalance of salts in the body (electrolytes).  Lack of sleep.  Low blood sugar (diabetes).  Low levels of oxygen. This comes from conditions such as chronic lung disorders.  Side effects of medicines, or taking medicines that affect other medicines (drug interactions).  Lack of certain nutrients, especially niacin, thiamine, vitamin C, or vitamin B.  Sudden drop in body temperature (hypothermia).  Change in routine, such as traveling or being hospitalized. Follow these instructions at home: Pay attention to your symptoms. Tell your health care provider about any changes or if you develop new symptoms. Follow these instructions to control or treat symptoms. Ask a family member or friend for help if needed. Medicines  Take over-the-counter and prescription medicines only as told by your health care  provider.  Ask your health care provider about changing or stopping any medicines that may be causing your confusion.  Avoid pain medicines or sleep medicines until you have fully recovered.  Use a pillbox or an alarm to help you take the right medicines at the right time. Lifestyle   Eat a balanced diet that includes fruits and vegetables.  Get enough sleep. For most adults, this is 7-9 hours each night.  Do not drink alcohol.  Do not become isolated. Spend time with other people and make plans for your days.  Do not drive until your health care provider says that it is safe to do so.  Do not use any products that contain nicotine or tobacco, such as cigarettes and e-cigarettes. If you need help quitting, ask your health care provider.  Stop other activities that may increase your chances of getting hurt. These may include some work duties, sports activities, swimming, or bike riding. Ask your health care provider what activities are safe for you. What caregivers can do  Find out if the person is confused. Ask the person to state his or her name, age, and the date. If the person is unsure or answers incorrectly, he or she may be confused.  Always introduce yourself, no matter how well the person knows you.  Remind the person of his or her location. Do this often.  Place a calendar and clock near the person who is confused.  Talk about current events and plans for the day.  Keep the environment calm, quiet, and peaceful.  Help the person do the things that he or she is unable  to do. These include: ? Taking medicines. ? Keeping follow-up visits with his or her health care provider. ? Helping with household duties, including meal preparation. ? Running errands.  Get help if you need it. There are several support groups for caregivers.  If the person you are helping needs more support, consider day care, extended care programs, or a skilled nursing facility. The person's  health care provider may be able to help evaluate these options. General instructions  Monitor yourself for any conditions you may have. These may include: ? Checking your blood glucose levels, if you have diabetes. ? Watching your weight, if you are overweight. ? Monitoring your blood pressure, if you have hypertension. ? Monitoring your body temperature, if you have a fever.  Keep all follow-up visits as told by your health care provider. This is important. Contact a health care provider if:  Your symptoms get worse. Get help right away if you:  Feel that you are not able to care for yourself.  Develop severe headaches, repeated vomiting, seizures, blackouts, or slurred speech.  Have increasing confusion, weakness, numbness, restlessness, or personality changes.  Develop a loss of balance, have marked dizziness, feel uncoordinated, or fall.  Develop severe anxiety, or you have delusions or hallucinations. These symptoms may represent a serious problem that is an emergency. Do not wait to see if the symptoms will go away. Get medical help right away. Call your local emergency services (911 in the [place] Do not drive yourself to the hospital. Summary  Confusion is the inability to think with the usual speed or clarity. People who are confused often describe their thinking as cloudy or unclear.  Confusion can also include having difficulty remembering, paying attention, or making decisions.  Confusion may come on quickly or develop slowly over time, depending on the cause. There are many different causes of confusion.  Ask for help from family members or friends if you are unable to take care of yourself. This information is not intended to replace advice given to you by your health care provider. Make sure you discuss any questions you have with your health care provider. Document Revised: 06/18/2017 Document Reviewed: 06/18/2017 Elsevier Patient Education  2020 [employer].

## 2019-10-03 NOTE — Progress Notes (Addendum)
MEDICARE ANNUAL WELLNESS VISIT AND FOLLOW UP  Assessment:    Essential hypertension - fairly controlled off of medications, DASH diet, exercise and monitor at home. Call if greater than 130/80.  -     CBC with Differential/Platelet -     CMP/GFR -     TSH  PVD (peripheral vascular disease) (HCC) Control blood pressure, cholesterol, glucose, increase exercise.   PAF ([place]) Monitoring only, off of elequis due to risk factors; follows with cardiology.   Atherosclerosis of aorta (HCC) Control blood pressure, cholesterol, glucose, increase exercise.   COPD No triggers, well controlled symptoms, cont to monitor  Hypothyroidism, unspecified type Hypothyroidism-check TSH level, continue medications the same, reminded to take on an empty stomach 30-35mins before food.  -     TSH  Pure hypercholesterolemia -continue medications, check lipids at routine follow ups, decrease fatty foods, increase activity.   Medication management  Aortic stenosis, severe S/p TAVR, follows with cardiology   OSA and COPD overlap syndrome ([place]) Not on CPAP, states can not wear, STRONGLY SUGGEST getting on it with heart/lung history, given information and will consider  COPD with asthma ([place]) Not on CPAP, states can not wear, STRONGLY SUGGEST getting on it with heart/lung history, given information and will consider Follow up pulmonary  Cavitating mass in left upper lung lobe Follow up pulmonary   Hiatal hernia Monitor  Gastroesophageal reflux disease, esophagitis presence not specified Continue PPI/H2 blocker, diet discussed  BPH w/LUTS monitor  RLS (restless legs syndrome)  Vitamin D deficiency Continue supplement  Prostate cancer ([place]) Continue follow up Following with Dr. [name]  Esophageal stricture/dysphagia Improved s/p esophageal dilation; continue PPI, carafate;   Severe protein calorie malnutrition ([place]) Check CMP, continue   Dementia ([place]) Suspect vascular etiology at  baseline considering risk factors Labs have been normal,  Discussed and proceed with brain imaging which was never obtained for baseline Family typically managing needs well, looking into home health to assist some days  Episodic confusion/new intermittent visual hallucinations Episodic confusion with new visual hallucinations, r/o metabolic abnormality, investigate for occult infection, UTI, pneumonia,  no concerning meds or recent changes Admits possible dehydration; water intake reviewed and encouraged close monitoring/tracking by family  Will also pursue CT imaging after discussion with family, r/o bleed, CVA, suspect he has baseline vascular dementia considering his risk factors Keep close follow up as scheduled in 2 weeks, sooner if changes Will go to the ER if falls with head injury, new severe headache, changes vision/speech, imbalance, weakness. -     CBC with Differential/Platelet -     COMPLETE METABOLIC PANEL WITH GFR -     TSH -     Urinalysis, Routine w reflex microscopic -     DG Chest 2 View; Future -     CT HEAD W & WO CONTRAST; Future  Unilateral R sided blurry vs double vision Unclear, appears simple blurry vision following last glasses change, intermittent x 8-9 months  Getting CT head as per above Encouraged follow up with vision provider for glasses check   Over 30 minutes of exam, counseling, chart review and critical decision making was performed   Future Appointments  Date Time Provider [place]  10/13/2019 10:45 AM Robert Comber, NP [facility] None  01/16/2020 10:30 AM Robert Pinto, MD [facility] None  07/30/2020  2:00 PM Robert Pinto, MD [facility] None     Plan:   During the course of the visit the patient was educated and counseled about appropriate screening and preventive  services including:    Pneumococcal vaccine  Prevnar 13   Influenza vaccine  Td vaccine  Screening electrocardiogram  Bone densitometry  screening  Colorectal cancer screening  Diabetes screening  Glaucoma screening  Nutrition counseling   Advanced directives: requested   Subjective:  Robert Schaefer is a 79 y.o. male who presents for Medicare Annual Wellness Visit and follow up  He has had memory concerns for several years, intermittent in flares, has good days and bad days, have suspected vascular etiology, normal labs and no baseline brain imaging. Presents for 2 weeks of increased episodes of confusion and new visual hallucinations. Accompanied by wife and daughter today who supplement history.   Today is lucid, but family reports he has had several bad days, was reportedly worst last week, will wake up confused and not himself, seeing people who aren't there and will interact with them, has had some agitation as he perceives these people are trespassing, "coming into my house" - easily redirected for the most part if family points out there is not one there. He has apparently called 911 on several occasions in recent weeks due to these concerns in the middle of the night. This is very atypical for him.   Denies urinary sx, fever/chills, fatigue/malaise, has reported some coughing episodes but rare, denies CP, dyspnea, dizziness, HA, new vision changes (reports intermittent R eye double vision worse in poor lighting ongoing for 8-9 months since last new glasses), numbness/tingling, word finding difficulty, slurred words with episodes of confusion.   Does have chronic unsteady gait, uses walker, falls some but none notable in recent weeks, denies known head injury.   Per family at baseline and himself they are managing well, lives with wife, daughter [name] checks on them daily, she drives to and from appointments. Patient no longer drives. Wife heats up food.  Wife assists with hygiene, needs to remind and supervise dressing/bathing.  Balance is a big concern, ambulates with walker in the past year, several falls this  year, no known big injury.   Looking into hiring home health, working with a [place].   No baseline brain imaging for review; he has had carotid ultrasound in 2019 without remarkable stenosis.   Patient has hx/o OSA but is not on CPAP due to mask intolerance. Patient has hx/o a cavitary LUL lung lesion followed by Dr [name] with lesion resolving & felt due to infectious process. Patient has hx/o GERD& Achalasia & esophageal stricture dilated by Dr Silverio Decamp. Recently in 08/26/2019 presented to ED with esophageal obstruction, had botox injection and underwent EGD 08/2019 which benign appearing esophageal stricture which was dilated. He is on protonix 40 mg BID, carafate; taking ensure. Per family has had no difficulty eating since this procedure. He was well immediately after procedure for several days prior to onset of current sx.   History of PAD s/p  intervention with Dr. [name] to right common iliac and mesenteric. He had severe AS and underwent TAVR by Dr. Argentina Ponder and did well. He had normal stress test 03/2016. He has COPD, is ex smoker, ~100 pack year history, quit 10 years ago. He has had PAF noted during admissions, follows with Dr. [name], was on low dose elequis but this was discontinued following 14 day holter recently which did not show a. Fib. he did have sinus bradycardia in the 30s with 3-second pauses. Known LBBB. Has been referred to EP, Dr. Curt Bears is following.   He has had elevated blood pressure. His blood pressure  has been controlled at home, today their BP is BP: 120/64 He does not workout. He denies chest pain, dizziness, some exertional dyspnea, consistent with baseline with known COPD.    Body mass index is 20.97 kg/m., he is working on diet and exercise. Taking ensure due to dysphagia and protein calorie malnutrition.  Wt Readings from Last 3 Encounters:  10/03/19 126 lb (57.2 kg)  09/20/19 123 lb 14.4 oz (56.2 kg)  08/26/19 119 lb 12.8 oz (54.3 kg)   He is  following with Dr. [name] for prostate cancer, seeing every 3-6 months.   He is not on cholesterol medication and denies myalgias. His cholesterol is not at goal of 70 or less but not aggressively pursued due protein/calorie malnutrition.  The cholesterol last visit was:   Lab Results  Component Value Date   CHOL 172 07/11/2019   HDL 64 07/11/2019   LDLCALC 92 07/11/2019   TRIG 75 07/11/2019   CHOLHDL 2.7 07/11/2019   He has had prediabetes for years recently resolve to normal range with protein/calorie malnutrition consequent of dementia and esophageal stricture.   Last A1C in the office was:  Lab Results  Component Value Date   HGBA1C 4.9 07/11/2019   Last GFR: Lab Results  Component Value Date   GFRNONAA >60 08/26/2019   Patient is on Vitamin D supplement, taking 5000 IU a day.   Lab Results  Component Value Date   VD25OH 37 07/11/2019   He is on thyroid medication. His medication was not changed last visit.   Lab Results  Component Value Date   TSH 2.19 07/11/2019  .  Lab Results  Component Value Date   [id number] 06/10/2016      Medication Review: Current Outpatient Medications on File Prior to Visit  Medication Sig Dispense Refill  . albuterol (PROVENTIL HFA;VENTOLIN HFA) 108 (90 Base) MCG/ACT inhaler Inhale 1 puff into the lungs every 6 (six) hours as needed for wheezing or shortness of breath.    [name] aspirin EC 81 MG tablet Take 81 mg by mouth daily.    . Cholecalciferol (VITAMIN D-3) 5000 units TABS Take 5,000 Units by mouth daily at 3 pm.    . feeding supplement, ENSURE ENLIVE, (ENSURE ENLIVE) LIQD Take 237 mLs by mouth 5 (five) times daily.     . fluticasone (FLONASE) 50 MCG/ACT nasal spray Place 2 sprays into both nostrils daily as needed for allergies or rhinitis.     [name] levothyroxine (SYNTHROID) 50 MCG tablet Take 1 tablet daily on an empty stomach with only water for 30 minutes & no Antacid meds, Calcium or Magnesium for 4 hours & avoid Biotin 90 tablet  3  . pantoprazole (PROTONIX) 40 MG tablet Take 1 tablet (40 mg total) by mouth 2 (two) times daily before a meal. 30 tablet 11  . sucralfate (CARAFATE) 1 g tablet Take 1 tablet (1 g total) by mouth 4 (four) times daily. 120 tablet 1   No current facility-administered medications on file prior to visit.    Current Problems (verified) Patient Active Problem List   Diagnosis Date Noted  . Dementia ([place]) 10/03/2019  . COPD (chronic obstructive pulmonary disease) ([place])   . Esophageal obstruction due to food impaction   . Esophageal diverticulum   . Sinus bradycardia 05/24/2019  . Abnormal glucose 12/09/2018  . Esophageal diverticular disease   . Loss of weight   . Severe protein-calorie malnutrition ([place])   . Paroxysmal atrial fibrillation ([place]) 06/15/2018  . S/P TAVR (transcatheter  aortic valve replacement) 03/16/2018  . Prostate cancer ([place])   . OSA (obstructive sleep apnea)   . Achalasia   . Severe aortic stenosis   . Esophageal dysphagia   . Esophageal stricture   . Atherosclerosis of aorta ([place]) 09/15/2016  . Cavitating mass in left upper lung lobe 08/12/2016  . PVD (peripheral vascular disease) ([place]) 05/31/2016  . Hypothyroidism 11/07/2014  . BPH w/LUTS 11/07/2014  . Medication management 04/11/2014  . Vitamin D deficiency 07/05/2013  . Hyperlipidemia, mixed   . RLS (restless legs syndrome)   . Essential hypertension 09/28/2009  . GERD 09/28/2009    Screening Tests Immunization History  Administered Date(s) Administered  . DT (Pediatric) 09/22/2013  . Influenza, High Dose Seasonal PF 04/11/2014, 04/20/2015  . Zoster 06/30/2005    Preventative care: Last colonoscopy: 2011 PFT 07/2016 EGD 08/2019 CT chest 01/2018 - aortic atherosclerosis CXR: 08/2019 MGM 2015 US carotid 09/2014 Echo 09/20200 Stress test 03/2016 Cath 12/2017  Prior vaccinations: TD or Tdap: 2015  Influenza: 2016 defer today   Pneumococcal:  Prevnar13:  Shingles/Zostavax: 2007  Names of  Other Physician/Practitioners you currently use: 1. [facility] here for primary care 2. Dr. Truman Hayward, eye doctor, last visit 2020,  3. Dr.Jones , dentist, last visit 2017   Patient Care Team: Robert Pinto, MD as PCP - General (Internal Medicine) [name], MD as PCP - Cardiology (Cardiology) Jari Pigg, MD as Consulting Physician (Dermatology) Larey Dresser, MD as Consulting Physician (Cardiology) Carolan Clines, MD (Inactive) as Consulting Physician (Urology) Erroll Luna, MD as Consulting Physician (General Surgery) Josue Hector, MD as Consulting Physician (Cardiology) Michael Boston, MD as Consulting Physician (General Surgery) Armbruster, Carlota Raspberry, MD as Consulting Physician (Gastroenterology)  Allergies Allergies  Allergen Reactions  . Penicillins Anaphylaxis, Itching, Swelling and Other (See Comments)    Did it involve swelling of the face/tongue/throat, SOB, or low BP? Yes Did it involve sudden or severe rash/hives, skin peeling, or any reaction on the inside of your mouth or nose? No Did you need to seek medical attention at a hospital or doctor's office? Yes When did it last happen?Many years ago If all above answers are "NO", may proceed with cephalosporin use.   . Sulfa Antibiotics Anaphylaxis and Swelling  . Versed [Midazolam] Other (See Comments)    Confusion, delerium    SURGICAL HISTORY He  has a past surgical history that includes Colonoscopy; Pilonidal cyst / sinus excision (2000); Insertion of mesh (Right, 02/23/2014); Inguinal hernia repair (Right, 02/23/2014); Hernia repair; Cataract extraction w/ intraocular lens  implant, bilateral (Bilateral); Esophagogastroduodenoscopy (egd) with esophageal dilation (last one 11-09-2015  dr Yolanda Bonine); Cardiac catheterization (N/A, 05/29/2016); Prostate biopsy (09-22-2016  dr [name] office); Cardiovascular stress test (04-23-2016   dr berry); transthoracic echocardiogram  (05-05-2016   dr berry); Gold seed implant (N/A, 04/29/2017); SPACE OAR INSTILLATION (N/A, 04/29/2017); Upper gastrointestinal endoscopy; Esophageal manometry (N/A, 10/12/2017); Esophagogastroduodenoscopy (N/A, 10/12/2017); Savory dilation (N/A, 10/12/2017); Cardiac catheterization (Bilateral, 01/25/2018); Anal fistulectomy (2010); RIGHT/LEFT HEART CATH AND CORONARY ANGIOGRAPHY (N/A, 01/25/2018); ABDOMINAL AORTOGRAM (N/A, 01/25/2018); Transcatheter aortic valve replacement, transapical (Left, 03/16/2018); Esophagogastroduodenoscopy (egd) with propofol (N/A, 11/17/2018); Savory dilation (N/A, 11/17/2018); Balloon dilation (N/A, 11/17/2018); Foreign Body Removal (11/17/2018); Esophagogastroduodenoscopy (egd) with propofol (N/A, 08/26/2019); Botox injection (08/26/2019); Foreign Body Removal (08/26/2019); Esophagogastroduodenoscopy (egd) with propofol (N/A, 09/20/2019); Balloon dilation (N/A, 09/20/2019); and Foreign Body Removal (09/20/2019). FAMILY HISTORY His family history includes Asthma in his father; Heart attack in his mother; Stroke in his father. SOCIAL HISTORY He  reports that he quit smoking about 14 years ago. His smoking use included cigarettes. He has a 50.00 pack-year smoking history. He has never used smokeless tobacco. He reports that he does not drink alcohol or use drugs.  MEDICARE WELLNESS OBJECTIVES: Physical activity: Current Exercise Habits: The patient does not participate in regular exercise at present, Exercise limited by: neurologic condition(s) Cardiac risk factors: Cardiac Risk Factors include: advanced age (>73men, >28 women);dyslipidemia;hypertension;sedentary lifestyle;male gender;smoking/ tobacco exposure Depression/mood screen:   Depression screen [facility] 2/9 10/03/2019  Decreased Interest 0  Down, Depressed, Hopeless 0  PHQ - 2 Score 0  Some recent data might be hidden    ADLs:  In your present state of health, do you have any difficulty performing the following activities: 10/03/2019  08/26/2019  Hearing? N -  Vision? Y -  Comment overdue follow up for glasses -  Difficulty concentrating or making decisions? Y -  Comment - -  Walking or climbing stairs? Y -  Comment uses walker -  Dressing or bathing? Y -  Comment wife assists -  Doing errands, shopping? Tempie Donning  Comment family transports -  Conservation officer, nature and eating ? Y -  Comment wife manages -  Using the Toilet? N -  In the past six months, have you accidently leaked urine? Y -  Comment wears depends -  Do you have problems with loss of bowel control? N -  Managing your Medications? Y -  Comment family -  Managing your Finances? Y -  Comment family -  Housekeeping or managing your Housekeeping? Y -  Comment family, looking into hiring help -  Some recent data might be hidden     Cognitive Testing  Alert? Yes  Normal Appearance?Yes  Oriented to person? Yes  Place? Yes   Time? Yes  Recall of three objects?  No 2/3  Can perform simple calculations? Yes  Displays appropriate judgment?Yes  Can read the correct time from a watch face?Yes  EOL planning: Does Patient Have a Medical Advance Directive?: Yes Type of Advance Directive: Healthcare Power of Attorney, Living will Does patient want to make changes to medical advance directive?: No - Patient declined Copy of [place] in Chart?: No - copy requested  Review of Systems  Constitutional: Negative for chills, fever and malaise/fatigue.  HENT: Negative for congestion and sore throat.   Eyes: Positive for double vision (he heports intermittent double vision of R eye "nearly 1 year" ongoing after getting new glasses). Negative for blurred vision.  Respiratory: Negative for cough, shortness of breath and wheezing.   Cardiovascular: Negative for chest pain, palpitations and leg swelling.  Gastrointestinal: Negative for abdominal pain, blood in stool, constipation, diarrhea, heartburn, melena, nausea and vomiting.  Genitourinary: Negative.    Musculoskeletal: Positive for falls. Negative for joint pain and myalgias.  Skin: Negative.   Neurological: Negative for dizziness, tingling, tremors, sensory change, speech change, focal weakness, loss of consciousness and headaches.  Psychiatric/Behavioral: Positive for hallucinations and memory loss (chronic ongoing dementia; worse in last 2 weeks). Negative for depression, substance abuse and suicidal ideas. The patient is not nervous/anxious and does not have insomnia.      Objective:     Today's Vitals   10/03/19 1457  BP: 120/64  Pulse: (!) 59  Temp: (!) 97.5 F (36.4 C)  SpO2: 97%  Weight: 126 lb (57.2 kg)  Height: 5\' 5"  (1.651 m)   Body mass index is 20.97 kg/m.  General appearance: alert, no distress,  WD/WN, male HEENT: normocephalic, sclerae anicteric, TMs pearly, nares patent, no discharge or erythema, pharynx normal Oral cavity: MMM, no lesions Neck: supple, no lymphadenopathy, no thyromegaly, no masses Heart: RRR, normal S1, S2, no murmur heard today.  Lungs: CTA , no wheezes, rhonchi, or rales, ? Mildly decreased in bil bases, R < L, some rales cleared with cough Abdomen: +bs, soft, non tender, non distended, no masses, no hepatomegaly, no splenomegaly Musculoskeletal: nontender, no swelling, no obvious deformity Extremities: no edema, no cyanosis, no clubbing Pulses: 2+ symmetric, upper and lower extremities, normal cap refill Neurological: alert, oriented x 3, CN2-12 intact, symmetrical strength normal upper extremities and lower extremities, sensation normal throughout, DTRs 2+ throughout, slow rapid alternating, heel to shin, cannot stand without assistance for Romberg, gait is slow and shuffling with walker, poor short term recall Psychiatric: normal affect, behavior normal, pleasant   Medicare Attestation I have personally reviewed: The patient's medical and social history Their use of alcohol, tobacco or illicit drugs Their current medications and  supplements The patient's functional ability including ADLs,fall risks, home safety risks, cognitive, and hearing and visual impairment Diet and physical activities Evidence for depression or mood disorders  The patient's weight, height, BMI, and visual acuity have been recorded in the chart.  I have made referrals, counseling, and provided education to the patient based on review of the above and I have provided the patient with a written personalized care plan for preventive services.     Izora Ribas, NP   10/03/2019

## 2019-10-04 ENCOUNTER — Ambulatory Visit
Admission: RE | Admit: 2019-10-04 | Discharge: 2019-10-04 | Disposition: A | Discharge status: Home / Self Care | Payer: Medicare Other | Source: Ambulatory Visit | Attending: Adult Health | Admitting: Adult Health

## 2019-10-04 ENCOUNTER — Other Ambulatory Visit: Payer: Self-pay | Admitting: Adult Health

## 2019-10-04 DIAGNOSIS — R441 Visual hallucinations: Secondary | ICD-10-CM

## 2019-10-04 DIAGNOSIS — F0390 Unspecified dementia without behavioral disturbance: Secondary | ICD-10-CM

## 2019-10-04 DIAGNOSIS — R410 Disorientation, unspecified: Secondary | ICD-10-CM

## 2019-10-04 DIAGNOSIS — R4182 Altered mental status, unspecified: Secondary | ICD-10-CM | POA: Diagnosis not present

## 2019-10-04 DIAGNOSIS — R05 Cough: Secondary | ICD-10-CM | POA: Diagnosis not present

## 2019-10-04 DIAGNOSIS — Z87891 Personal history of nicotine dependence: Secondary | ICD-10-CM

## 2019-10-04 LAB — COMPLETE METABOLIC PANEL WITH GFR
AG Ratio: 1.6 (calc) (ref 1.0–2.5)
ALT: 12 U/L (ref 9–46)
AST: 13 U/L (ref 10–35)
Albumin: 4.2 g/dL (ref 3.6–5.1)
Alkaline phosphatase (APISO): 67 U/L (ref 35–144)
BUN: 13 mg/dL (ref 7–25)
CO2: 28 mmol/L (ref 20–32)
Calcium: 10 mg/dL (ref 8.6–10.3)
Chloride: 104 mmol/L (ref 98–110)
Creat: 0.87 mg/dL (ref 0.70–1.18)
GFR, Est African American: 95 mL/min/{1.73_m2} (ref >=60)
GFR, Est Non African American: 82 mL/min/{1.73_m2} (ref >=60)
Globulin: 2.6 g/dL (calc) (ref 1.9–3.7)
Glucose, Bld: 94 mg/dL (ref 65–99)
Potassium: 4.5 mmol/L (ref 3.5–5.3)
Sodium: 141 mmol/L (ref 135–146)
Total Bilirubin: 0.6 mg/dL (ref 0.2–1.2)
Total Protein: 6.8 g/dL (ref 6.1–8.1)

## 2019-10-04 LAB — URINALYSIS, ROUTINE W REFLEX MICROSCOPIC
Bilirubin Urine: NEGATIVE
Glucose, UA: NEGATIVE
Hgb urine dipstick: NEGATIVE
Ketones, ur: NEGATIVE
Leukocytes,Ua: NEGATIVE
Nitrite: NEGATIVE
Protein, ur: NEGATIVE
Specific Gravity, Urine: 1.008 (ref 1.001–1.03)
pH: 7.5 (ref 5.0–8.0)

## 2019-10-04 LAB — CBC WITH DIFFERENTIAL/PLATELET
Absolute Monocytes: 291 cells/uL (ref 200–950)
Basophils Absolute: 41 cells/uL (ref 0–200)
Basophils Relative: 1 %
Eosinophils Absolute: 119 cells/uL (ref 15–500)
Eosinophils Relative: 2.9 %
HCT: 35.7 % — ABNORMAL LOW (ref 38.5–50.0)
Hemoglobin: 12.5 g/dL — ABNORMAL LOW (ref 13.2–17.1)
Lymphs Abs: 894 cells/uL (ref 850–3900)
MCH: 33.2 pg — ABNORMAL HIGH (ref 27.0–33.0)
MCHC: 35 g/dL (ref 32.0–36.0)
MCV: 94.7 fL (ref 80.0–100.0)
MPV: 10.9 fL (ref 7.5–12.5)
Monocytes Relative: 7.1 %
Neutro Abs: 2755 cells/uL (ref 1500–7800)
Neutrophils Relative %: 67.2 %
Platelets: 160 10*3/uL (ref 140–400)
RBC: 3.77 10*6/uL — ABNORMAL LOW (ref 4.20–5.80)
RDW: 13.1 % (ref 11.0–15.0)
Total Lymphocyte: 21.8 %
WBC: 4.1 10*3/uL (ref 3.8–10.8)

## 2019-10-04 LAB — TSH: TSH: 1.46 mIU/L (ref 0.40–4.50)

## 2019-10-05 ENCOUNTER — Other Ambulatory Visit: Payer: Self-pay | Admitting: Adult Health

## 2019-10-05 ENCOUNTER — Encounter: Payer: Self-pay | Admitting: Adult Health

## 2019-10-05 DIAGNOSIS — R413 Other amnesia: Secondary | ICD-10-CM

## 2019-10-05 DIAGNOSIS — R2681 Unsteadiness on feet: Secondary | ICD-10-CM

## 2019-10-05 DIAGNOSIS — R441 Visual hallucinations: Secondary | ICD-10-CM

## 2019-10-05 DIAGNOSIS — R410 Disorientation, unspecified: Secondary | ICD-10-CM

## 2019-10-05 DIAGNOSIS — F0391 Unspecified dementia with behavioral disturbance: Secondary | ICD-10-CM

## 2019-10-10 ENCOUNTER — Other Ambulatory Visit: Payer: Self-pay | Admitting: Adult Health

## 2019-10-10 DIAGNOSIS — R441 Visual hallucinations: Secondary | ICD-10-CM

## 2019-10-10 DIAGNOSIS — R2681 Unsteadiness on feet: Secondary | ICD-10-CM

## 2019-10-10 DIAGNOSIS — F0391 Unspecified dementia with behavioral disturbance: Secondary | ICD-10-CM

## 2019-10-10 DIAGNOSIS — R410 Disorientation, unspecified: Secondary | ICD-10-CM

## 2019-10-10 DIAGNOSIS — R413 Other amnesia: Secondary | ICD-10-CM

## 2019-10-10 NOTE — Progress Notes (Deleted)
FOLLOW UP  Assessment:    Essential hypertension - fairly controlled off of medications, DASH diet, exercise and monitor at home. Call if greater than 130/80.  -     CBC with Differential/Platelet -     CMP/GFR -     TSH  PVD (peripheral vascular disease) (HCC) Control blood pressure, cholesterol, glucose, increase exercise.   PAF ([place]) Monitoring only, off of elequis due to risk factors; follows with cardiology.   Atherosclerosis of aorta (HCC) Control blood pressure, cholesterol, glucose, increase exercise.   COPD No triggers, well controlled symptoms, cont to monitor  Hypothyroidism, unspecified type Hypothyroidism-check TSH level, continue medications the same, reminded to take on an empty stomach 30-17mins before food.  -     TSH  Pure hypercholesterolemia Off of meds due to protein/calorie malnutrition *** check lipids at routine follow ups, decrease fatty foods, increase activity.   Aortic stenosis, severe S/p TAVR, follows with cardiology   OSA and COPD overlap syndrome ([place]) Not on CPAP, states can not wear, STRONGLY SUGGEST getting on it with heart/lung history, given information and will consider  COPD with asthma ([place]) Not on CPAP, states can not wear, STRONGLY SUGGEST getting on it with heart/lung history, given information and will consider Follow up pulmonary  Gastroesophageal reflux disease, esophagitis presence not specified Continue PPI/H2 blocker, diet discussed  Vitamin D deficiency Continue supplement  Prostate cancer ([place]) Continue follow up Following with Dr. [name]  Esophageal stricture/dysphagia Improved s/p esophageal dilation; continue PPI, carafate;   Severe protein calorie malnutrition (HCC) Check CMP, ***  Dementia ([place]) Suspect vascular etiology at baseline considering risk factors Labs have been normal, CT showed microvascular changes Family typically managing needs well, looking into home health to assist some  days  Episodic confusion/new intermittent visual hallucinations Episodic confusion with new visual hallucinations, r/o metabolic abnormality, investigate for occult infection, UTI, pneumonia,  no concerning meds or recent changes Admits possible dehydration; water intake reviewed and encouraged close monitoring/tracking by family  Will also pursue CT imaging after discussion with family, r/o bleed, CVA, suspect he has baseline vascular dementia considering his risk factors Keep close follow up as scheduled in 2 weeks, sooner if changes Will go to the ER if falls with head injury, new severe headache, changes vision/speech, imbalance, weakness. -     CBC with Differential/Platelet -     COMPLETE METABOLIC PANEL WITH GFR -     TSH -     Urinalysis, Routine w reflex microscopic -     DG Chest 2 View; Future -     CT HEAD W & WO CONTRAST; Future  Unilateral R sided blurry vs double vision Unclear, appears simple blurry vision following last glasses change, intermittent x 8-9 months  CT normal, pending MRI with/without contrast  Encouraged follow up with vision provider for glasses check   Over 30 minutes of exam, counseling, chart review and critical decision making was performed   Future Appointments  Date Time Provider [place]  10/13/2019 10:45 AM Liane Comber, NP [facility] None  01/16/2020 10:30 AM [name], MD [facility] None  07/30/2020  2:00 PM [name], MD [facility] None        Subjective:  Robert Schaefer is a 79 y.o. male who presents for Medicare Annual Wellness Visit and follow up  He has had memory concerns for several years, intermittent in flares, has good days and bad days, have suspected vascular etiology, normal labs and no baseline brain imaging. He was seen on  10/03/2019 for 2 weeks of atypical episodes of confusion and new visual hallucinations. Accompanied by wife and daughter today who supplemented history. At last visit was lucid, but  family reported he has had several bad days, was reportedly the week prior, would wake up confused and not himself, seeing people who aren't there and will interact with them, has had some agitation as he perceives these people are trespassing, "coming into my house" - easily redirected for the most part if family points out there is not one there. He apparently called 911 on several occasions in recent weeks due to these concerns in the middle of the night. This is very atypical for him. Denied any med changes, denies sx concerning for infection.   Reported new vision changes (reports intermittent R eye double vision worse in poor lighting ongoing for 8-9 months since last new glasses), word finding difficulty, slurred words with episodes of confusion. Does have chronic unsteady gait, uses walker, falls some but none notable in recent weeks, denied known head injury. He had carotid ultrasound in 2019 without remarkable stenosis.   CBC, CMP, TSH, UA, CXR were normal. CT brain without contrast showed mild to moderate cerebral atrophy with widening of the extra-axial spaces and ventricular dilatation. There are areas of decreased attenuation within the white matter tracts of the supratentorial brain, consistent with microvascular disease changes. No hyperdense vessel or unexpected calcification. He has been referred to neuro Dr. Jannifer Franklin, pending MRI brain w & w/o contrast. ***    Patient has hx/o OSA but is not on CPAP due to mask intolerance. Patient has hx/o a cavitary LUL lung lesion followed by Dr [name] with lesion resolving & felt due to infectious process. Patient has hx/o GERD& Achalasia & esophageal stricture dilated by Dr Silverio Decamp. Recently in 08/26/2019 presented to ED with esophageal obstruction, had botox injection and underwent EGD 08/2019 which benign appearing esophageal stricture which was dilated. He is on protonix 40 mg BID, carafate; taking ensure. Per family has had no difficulty eating since  this procedure. He was well immediately after procedure for several days prior to onset of current sx.   History of PAD s/p  intervention with Dr. [name] to right common iliac and mesenteric. He had severe AS and underwent TAVR by Dr. Argentina Ponder and did well. He had normal stress test 03/2016. He has COPD, is ex smoker, ~100 pack year history, quit 10 years ago. He has had PAF noted during admissions, follows with Dr. [name], was on low dose elequis but this was discontinued following 14 day holter recently which did not show a. Fib. he did have sinus bradycardia in the 30s with 3-second pauses. Known LBBB. Has been referred to EP, Dr. Curt Bears is following.    Per family at baseline and himself they are managing well, lives with wife, daughter [name] checks on them daily, she drives to and from appointments. Patient no longer drives. Wife heats up food.  Wife assists with hygiene, needs to remind and supervise dressing/bathing.  Balance is a big concern, ambulates with walker in the past year, several falls this year, no known big injury.   Looking into hiring home health, working with a [facility].     He has had elevated blood pressure. His blood pressure has been controlled at home, today their BP is   He does not workout. He denies chest pain, dizziness, some exertional dyspnea, consistent with baseline with known COPD.    There is no height or weight on  file to calculate BMI., he is working on diet and exercise. Taking ensure due to dysphagia and protein calorie malnutrition.  Wt Readings from Last 3 Encounters:  10/03/19 126 lb (57.2 kg)  09/20/19 123 lb 14.4 oz (56.2 kg)  08/26/19 119 lb 12.8 oz (54.3 kg)   He is following with Dr. [name] for prostate cancer, seeing every 3-6 months.   He is not on cholesterol medication and denies myalgias. His cholesterol is not at goal of 70 or less but not aggressively pursued due protein/calorie malnutrition.  The cholesterol last visit was:    Lab Results  Component Value Date   CHOL 172 07/11/2019   HDL 64 07/11/2019   LDLCALC 92 07/11/2019   TRIG 75 07/11/2019   CHOLHDL 2.7 07/11/2019   He has had prediabetes for years recently resolve to normal range with protein/calorie malnutrition consequent of dementia and esophageal stricture.   Last A1C in the office was:  Lab Results  Component Value Date   HGBA1C 4.9 07/11/2019   Last GFR: Lab Results  Component Value Date   GFRNONAA 82 10/03/2019   Patient is on Vitamin D supplement, taking 5000 IU a day.   Lab Results  Component Value Date   VD25OH 37 07/11/2019   He is on thyroid medication. His medication was not changed last visit.   Lab Results  Component Value Date   TSH 1.46 10/03/2019  .  Lab Results  Component Value Date   [id number] 06/10/2016      Medication Review: Current Outpatient Medications on File Prior to Visit  Medication Sig Dispense Refill  . albuterol (PROVENTIL HFA;VENTOLIN HFA) 108 (90 Base) MCG/ACT inhaler Inhale 1 puff into the lungs every 6 (six) hours as needed for wheezing or shortness of breath.    [name] aspirin EC 81 MG tablet Take 81 mg by mouth daily.    . Cholecalciferol (VITAMIN D-3) 5000 units TABS Take 5,000 Units by mouth daily at 3 pm.    . feeding supplement, ENSURE ENLIVE, (ENSURE ENLIVE) LIQD Take 237 mLs by mouth 5 (five) times daily.     . fluticasone (FLONASE) 50 MCG/ACT nasal spray Place 2 sprays into both nostrils daily as needed for allergies or rhinitis.     [name] levothyroxine (SYNTHROID) 50 MCG tablet Take 1 tablet daily on an empty stomach with only water for 30 minutes & no Antacid meds, Calcium or Magnesium for 4 hours & avoid Biotin 90 tablet 3  . pantoprazole (PROTONIX) 40 MG tablet Take 1 tablet (40 mg total) by mouth 2 (two) times daily before a meal. 30 tablet 11  . sucralfate (CARAFATE) 1 g tablet Take 1 tablet (1 g total) by mouth 4 (four) times daily. 120 tablet 1   No current facility-administered  medications on file prior to visit.    Current Problems (verified) Patient Active Problem List   Diagnosis Date Noted  . Dementia ([place]) 10/03/2019  . COPD (chronic obstructive pulmonary disease) ([place])   . Esophageal obstruction due to food impaction   . Esophageal diverticulum   . Sinus bradycardia 05/24/2019  . Abnormal glucose 12/09/2018  . Esophageal diverticular disease   . Loss of weight   . Severe protein-calorie malnutrition ([place])   . Paroxysmal atrial fibrillation ([place]) 06/15/2018  . S/P TAVR (transcatheter aortic valve replacement) 03/16/2018  . Prostate cancer ([place])   . OSA (obstructive sleep apnea)   . Achalasia   . Severe aortic stenosis   . Esophageal dysphagia   .  Esophageal stricture   . Atherosclerosis of aorta ([place]) 09/15/2016  . Cavitating mass in left upper lung lobe 08/12/2016  . PVD (peripheral vascular disease) ([place]) 05/31/2016  . Hypothyroidism 11/07/2014  . BPH w/LUTS 11/07/2014  . Medication management 04/11/2014  . Vitamin D deficiency 07/05/2013  . Hyperlipidemia, mixed   . RLS (restless legs syndrome)   . Essential hypertension 09/28/2009  . GERD 09/28/2009    Allergies Allergies  Allergen Reactions  . Penicillins Anaphylaxis, Itching, Swelling and Other (See Comments)    Did it involve swelling of the face/tongue/throat, SOB, or low BP? Yes Did it involve sudden or severe rash/hives, skin peeling, or any reaction on the inside of your mouth or nose? No Did you need to seek medical attention at a hospital or doctor's office? Yes When did it last happen?Many years ago If all above answers are "NO", may proceed with cephalosporin use.   . Sulfa Antibiotics Anaphylaxis and Swelling  . Versed [Midazolam] Other (See Comments)    Confusion, delerium    SURGICAL HISTORY He  has a past surgical history that includes Colonoscopy; Pilonidal cyst / sinus excision (2000); Insertion of mesh (Right, 02/23/2014); Inguinal hernia repair (Right,  02/23/2014); Hernia repair; Cataract extraction w/ intraocular lens  implant, bilateral (Bilateral); Esophagogastroduodenoscopy (egd) with esophageal dilation (last one 11-09-2015  dr Yolanda Bonine); Cardiac catheterization (N/A, 05/29/2016); Prostate biopsy (09-22-2016  dr [name] office); Cardiovascular stress test (04-23-2016   dr berry); transthoracic echocardiogram (05-05-2016   dr berry); Gold seed implant (N/A, 04/29/2017); SPACE OAR INSTILLATION (N/A, 04/29/2017); Upper gastrointestinal endoscopy; Esophageal manometry (N/A, 10/12/2017); Esophagogastroduodenoscopy (N/A, 10/12/2017); Savory dilation (N/A, 10/12/2017); Cardiac catheterization (Bilateral, 01/25/2018); Anal fistulectomy (2010); RIGHT/LEFT HEART CATH AND CORONARY ANGIOGRAPHY (N/A, 01/25/2018); ABDOMINAL AORTOGRAM (N/A, 01/25/2018); Transcatheter aortic valve replacement, transapical (Left, 03/16/2018); Esophagogastroduodenoscopy (egd) with propofol (N/A, 11/17/2018); Savory dilation (N/A, 11/17/2018); Balloon dilation (N/A, 11/17/2018); Foreign Body Removal (11/17/2018); Esophagogastroduodenoscopy (egd) with propofol (N/A, 08/26/2019); Botox injection (08/26/2019); Foreign Body Removal (08/26/2019); Esophagogastroduodenoscopy (egd) with propofol (N/A, 09/20/2019); Balloon dilation (N/A, 09/20/2019); and Foreign Body Removal (09/20/2019). FAMILY HISTORY His family history includes Asthma in his father; Heart attack in his mother; Stroke in his father. SOCIAL HISTORY He  reports that he quit smoking about 14 years ago. His smoking use included cigarettes. He has a 50.00 pack-year smoking history. He has never used smokeless tobacco. He reports that he does not drink alcohol or use drugs.   Depression screen PHQ 2/9 10/03/2019  Decreased Interest 0  Down, Depressed, Hopeless 0  PHQ - 2 Score 0  Some recent data might be hidden    ADLs:  In your present state of health, do you have any difficulty performing the following activities: 10/03/2019 08/26/2019  Hearing? N  -  Vision? Y -  Comment overdue follow up for glasses -  Difficulty concentrating or making decisions? Y -  Comment - -  Walking or climbing stairs? Y -  Comment uses walker -  Dressing or bathing? Y -  Comment wife assists -  Doing errands, shopping? Tempie Donning  Comment family transports -  Conservation officer, nature and eating ? Y -  Comment wife manages -  Using the Toilet? N -  In the past six months, have you accidently leaked urine? Y -  Comment wears depends -  Do you have problems with loss of bowel control? N -  Managing your Medications? Y -  Comment family -  Managing your Finances? Y -  Comment family -  Housekeeping or managing your  Housekeeping? Y -  Comment family, looking into hiring help -  Some recent data might be hidden     Cognitive Testing  Alert? Yes  Normal Appearance?Yes  Oriented to person? Yes  Place? Yes   Time? Yes  Recall of three objects?  No 2/3  Can perform simple calculations? Yes  Displays appropriate judgment?Yes  Can read the correct time from a watch face?Yes  EOL planning:    Review of Systems  Constitutional: Negative for chills, fever and malaise/fatigue.  HENT: Negative for congestion and sore throat.   Eyes: Positive for double vision (he heports intermittent double vision of R eye "nearly 1 year" ongoing after getting new glasses). Negative for blurred vision.  Respiratory: Negative for cough, shortness of breath and wheezing.   Cardiovascular: Negative for chest pain, palpitations and leg swelling.  Gastrointestinal: Negative for abdominal pain, blood in stool, constipation, diarrhea, heartburn, melena, nausea and vomiting.  Genitourinary: Negative.   Musculoskeletal: Positive for falls. Negative for joint pain and myalgias.  Skin: Negative.   Neurological: Negative for dizziness, tingling, tremors, sensory change, speech change, focal weakness, loss of consciousness and headaches.  Psychiatric/Behavioral: Positive for hallucinations and  memory loss (chronic ongoing dementia; worse in last 2 weeks). Negative for depression, substance abuse and suicidal ideas. The patient is not nervous/anxious and does not have insomnia.      Objective:     There were no vitals filed for this visit. There is no height or weight on file to calculate BMI.  General appearance: alert, no distress, WD/WN, male HEENT: normocephalic, sclerae anicteric, TMs pearly, nares patent, no discharge or erythema, pharynx normal Oral cavity: MMM, no lesions Neck: supple, no lymphadenopathy, no thyromegaly, no masses Heart: RRR, normal S1, S2, no murmur heard today.  Lungs: CTA , no wheezes, rhonchi, or rales, ? Mildly decreased in bil bases, R < L, some rales cleared with cough Abdomen: +bs, soft, non tender, non distended, no masses, no hepatomegaly, no splenomegaly Musculoskeletal: nontender, no swelling, no obvious deformity Extremities: no edema, no cyanosis, no clubbing Pulses: 2+ symmetric, upper and lower extremities, normal cap refill Neurological: alert, oriented x 3, CN2-12 intact, symmetrical strength normal upper extremities and lower extremities, sensation normal throughout, DTRs 2+ throughout, slow rapid alternating, heel to shin, cannot stand without assistance for [place], gait is slow and shuffling with walker, poor short term recall Psychiatric: normal affect, behavior normal, pleasant     Izora Ribas, NP   10/10/2019

## 2019-10-13 ENCOUNTER — Ambulatory Visit: Payer: Medicare Other | Admitting: Adult Health

## 2019-12-05 ENCOUNTER — Encounter: Payer: Self-pay | Admitting: Neurology

## 2019-12-05 ENCOUNTER — Other Ambulatory Visit: Payer: Self-pay

## 2019-12-05 ENCOUNTER — Ambulatory Visit (INDEPENDENT_AMBULATORY_CARE_PROVIDER_SITE_OTHER): Payer: Medicare Other | Admitting: Neurology

## 2019-12-05 ENCOUNTER — Telehealth: Payer: Self-pay | Admitting: *Deleted

## 2019-12-05 VITALS — BP 130/63 | HR 59 | Wt 135.0 lb

## 2019-12-05 DIAGNOSIS — R269 Unspecified abnormalities of gait and mobility: Secondary | ICD-10-CM | POA: Diagnosis not present

## 2019-12-05 DIAGNOSIS — C61 Malignant neoplasm of prostate: Secondary | ICD-10-CM

## 2019-12-05 DIAGNOSIS — F0390 Unspecified dementia without behavioral disturbance: Secondary | ICD-10-CM

## 2019-12-05 HISTORY — DX: Unspecified abnormalities of gait and mobility: R26.9

## 2019-12-05 NOTE — Progress Notes (Signed)
Reason for visit: Memory disturbance, gait disturbance  Referring physician: Dr. Jarrett Ables is a 79 y.o. male  History of present illness:  Robert Schaefer is a 79 year old right-handed white male with a history of a memory disturbance that dates back at least 3 years.  The patient has been more forgetful over the last year or so.  He has had some difficulty with his walking over the last 3 years as well, he has been using a walker.  He will shuffle his feet, he has difficulty getting up out of a chair, and difficulty with turns.  He has not had any tremor.  He does have short-term memory issues, he will have difficulty with memory names for people.  He has required assistance picking up with medications, appointments, and with finances.  The patient does not operate a motor vehicle, he has not driven in over 3 years.  The patient has begun over the last year having some hallucinations that are visual in nature and are occasional. They may sometimes be associated with some agitation.  The hallucinations have not been as much of a problem recently.  He has had problems with achalasia with the esophagus requiring dilations and he has had a 50 pound weight loss of the last 3 years.  He has now stabilized with his weight recently.  He will fall on occasion. He denies issues controlling the bowels or the bladder.  The patient has undergone a recent CT scan of the brain that shows at least a moderate level if not severe with small vessel disease.  The patient has a history of hypertension, paroxysmal atrial fibrillation, aortic disease, aortic valve replacement, COPD, sleep apnea, and a history of diabetes but his most recent hemoglobin A1c was 4.9.  He is sent to this office for an evaluation.  Past Medical History:  Diagnosis Date  . AAA (abdominal aortic aneurysm) ([place])    no longer monitoring was small area  . Achalasia   . Carotid stenosis, asymptomatic, bilateral    per duplex ---  bilateral <50% ICA  . COPD (chronic obstructive pulmonary disease) ([place])    pulmologist-  dr [name]  . Diabetes mellitus without complication (HCC)    diet controlled  . GERD (gastroesophageal reflux disease)   . Hyperlipidemia   . Hypertension   . Hypothyroidism   . Lesion of left lung    followed by dr [name] (pulmologist)  post infection scarring  . Mild dementia ([place])   . OSA (obstructive sleep apnea)    has a cpap-  intolerant; due to Claustaphobia (01/25/2018)  . Peripheral vascular disease [facility])    cardiologist-- dr berry-- per last ABI/Aorta/Iliac 06-09-2016  patent bilateral iliac kissing stents, >50% stenosis bilateral iliac artery (R>L) and left external iliac artery, patent IVC  . Prostate cancer [facility])    urologist-  dr [name] oncologist-  dr [name]. Dx'd 2018 s/p XRT  . RLS (restless legs syndrome)   . S/P TAVR (transcatheter aortic valve replacement) 02/2018   s/p TAVR via transapical approach with an Edwards Sapien 3 THV (size 26 mm, model # 9600TFX, serial # [id number])  . Schatzki's ring   . Severe aortic stenosis   . Wears dentures   . Wears glasses     Past Surgical History:  Procedure Laterality Date  . ABDOMINAL AORTOGRAM N/A 01/25/2018   Procedure: ABDOMINAL AORTOGRAM;  Surgeon: [name], MD;  Location: [place] [facility];  Service: Cardiovascular;  Laterality: N/A;  .  ANAL FISTULECTOMY  2010  . BALLOON DILATION N/A 11/17/2018   Procedure: BALLOON DILATION;  Surgeon: [name], MD;  [facility]: [facility];  Service: Endoscopy;  Laterality: N/A;  . BALLOON DILATION N/A 09/20/2019   Procedure: BALLOON DILATION;  Surgeon: [name], MD;  Location: [facility];  Service: Endoscopy;  Laterality: N/A;  intubation  use fluoroscopy  . BOTOX INJECTION  08/26/2019   Procedure: BOTOX INJECTION;  Surgeon: Lavena Bullion, DO;  Location: [facility];  Service: Gastroenterology;;  . CARDIAC CATHETERIZATION Bilateral 01/25/2018  . CARDIOVASCULAR  STRESS TEST  04-23-2016   dr berry   normal nuclear study w/ no ischemia/  normal LV function and wall function, nuclear stress ef 57%  . CATARACT EXTRACTION W/ INTRAOCULAR LENS  IMPLANT, BILATERAL Bilateral   . COLONOSCOPY    . ESOPHAGEAL MANOMETRY N/A 10/12/2017   Procedure: ESOPHAGEAL MANOMETRY (EM);  Surgeon: [name], MD;  [facility];  Service: Gastroenterology;  Laterality: N/A;  . ESOPHAGOGASTRODUODENOSCOPY N/A 10/12/2017   Procedure: ESOPHAGOGASTRODUODENOSCOPY (EGD);  Surgeon: [name], MD;  Location: [facility];  Service: Gastroenterology;  Laterality: N/A;  . ESOPHAGOGASTRODUODENOSCOPY (EGD) WITH ESOPHAGEAL DILATION  last one 11-09-2015  dr Yolanda Bonine   dilation schatzki's ring  . ESOPHAGOGASTRODUODENOSCOPY (EGD) WITH PROPOFOL N/A 11/17/2018   Procedure: ESOPHAGOGASTRODUODENOSCOPY;  Surgeon: [name], MD;  [facility];  Service: Endoscopy;  Laterality: N/A;  . ESOPHAGOGASTRODUODENOSCOPY (EGD) WITH PROPOFOL N/A 08/26/2019   Procedure: ESOPHAGOGASTRODUODENOSCOPY (EGD) WITH PROPOFOL;  Surgeon: Lavena Bullion, DO;  [facility];  Service: Gastroenterology;  Laterality: N/A;  . ESOPHAGOGASTRODUODENOSCOPY (EGD) WITH PROPOFOL N/A 09/20/2019   Procedure: ESOPHAGOGASTRODUODENOSCOPY (EGD) WITH PROPOFOL;  Surgeon: [name], MD;  [facility];  Service: Endoscopy;  Laterality: N/A;  with intubation fluoro needed  . FOREIGN BODY REMOVAL  11/17/2018   Procedure: FOREIGN BODY REMOVAL;  Surgeon: [name], MD;  [facility];  Service: Endoscopy;;  . FOREIGN BODY REMOVAL  08/26/2019   Procedure: FOREIGN BODY REMOVAL;  Surgeon: Lavena Bullion, DO;  [facility];  Service: Gastroenterology;;  . FOREIGN BODY REMOVAL  09/20/2019   Procedure: FOREIGN BODY REMOVAL;  Surgeon: [name], MD;  [facility];  Service: Endoscopy;;  Food Impaction  . GOLD SEED IMPLANT N/A  04/29/2017   Procedure: GOLD SEED IMPLANT;  Surgeon: [name], MD;  [facility];  Service: Urology;  Laterality: N/A;  . HERNIA REPAIR    . INGUINAL HERNIA REPAIR Right 02/23/2014   Procedure: HERNIA REPAIR INGUINAL ADULT;  Surgeon: Joyice Faster. Cornett, MD;  Location: [place];  Service: General;  Laterality: Right;  . INSERTION OF MESH Right 02/23/2014   Procedure: INSERTION OF MESH;  Surgeon: Joyice Faster. Cornett, MD;  Location: [place];  Service: General;  Laterality: Right;  . PERIPHERAL VASCULAR CATHETERIZATION N/A 05/29/2016   Procedure: Lower Extremity Angiography;  Surgeon: [name], MD;  Location: [place] [facility];  Service: Cardiovascular;  Laterality: N/A; [place] orbital rotational atherectomy, PTA and stenting bilateral common iliac arteries   . PILONIDAL CYST / SINUS EXCISION  2000  . PROSTATE BIOPSY  09-22-2016  dr [name] office  . RIGHT/LEFT HEART CATH AND CORONARY ANGIOGRAPHY N/A 01/25/2018   Procedure: RIGHT/LEFT HEART CATH AND CORONARY ANGIOGRAPHY;  Surgeon: [name], MD;  Location: [place] [facility];  Service: Cardiovascular;  Laterality: N/A;  . SAVORY DILATION N/A 10/12/2017   Procedure: SAVORY DILATION;  Surgeon: [name], MD;  [facility];  Service: Gastroenterology;  Laterality: N/A;  . SAVORY DILATION N/A 11/17/2018   Procedure: SAVORY DILATION;  Surgeon: [name], MD;  [facility];  Service: Endoscopy;  Laterality: N/A;  . SPACE OAR INSTILLATION N/A 04/29/2017   Procedure: SPACE OAR INSTILLATION;  Surgeon: [name], MD;  Location: [facility];  Service: Urology;  Laterality: N/A;  . TRANSCATHETER AORTIC VALVE REPLACEMENT, TRANSAPICAL Left 03/16/2018   Procedure: TRANSCATHETER AORTIC VALVE REPLACEMENT, TRANSAPICAL;  Surgeon: Sherren Mocha, MD;  Location: [place];  Service: Open Heart Surgery;  Laterality: Left;  .  TRANSTHORACIC ECHOCARDIOGRAM  05-05-2016   dr berry   ef 28-31%,  grade 1diastolic dysfunction/  moderate AV stenosis with mod. AR (right coronary and noncoronary cusp mobility seversly restricted);  valve area 1.25cm^2, mean gradient 27 mmHg, peak gradient 56mmHg/  mild MR  . UPPER GASTROINTESTINAL ENDOSCOPY      Family History  Problem Relation Age of Onset  . Heart attack Mother   . Stroke Father   . Asthma Father   . Cancer Neg Hx   . Colon cancer Neg Hx   . Stomach cancer Neg Hx   . Esophageal cancer Neg Hx   . Rectal cancer Neg Hx   . Pancreatic cancer Neg Hx   . Prostate cancer Neg Hx     Social history:  reports that he quit smoking about 14 years ago. His smoking use included cigarettes. He has a 50.00 pack-year smoking history. He has never used smokeless tobacco. He reports that he does not drink alcohol or use drugs.  Medications:  Prior to Admission medications   Medication Sig Start Date End Date Taking? Authorizing Provider  albuterol (PROVENTIL HFA;VENTOLIN HFA) 108 (90 Base) MCG/ACT inhaler Inhale 1 puff into the lungs every 6 (six) hours as needed for wheezing or shortness of breath.   Yes [provider]  aspirin EC 81 MG tablet Take 81 mg by mouth daily.   Yes [provider]  Cholecalciferol (VITAMIN D-3) 5000 units TABS Take 5,000 Units by mouth daily at 3 pm.   Yes [provider]  feeding supplement, ENSURE ENLIVE, (ENSURE ENLIVE) LIQD Take 237 mLs by mouth 5 (five) times daily.    Yes [provider]  fluticasone (FLONASE) 50 MCG/ACT nasal spray Place 2 sprays into both nostrils daily as needed for allergies or rhinitis.    Yes [provider]  levothyroxine (SYNTHROID) 50 MCG tablet Take 1 tablet daily on an empty stomach with only water for 30 minutes & no Antacid meds, Calcium or Magnesium for 4 hours & avoid Biotin 06/26/19  Yes [name], MD  pantoprazole (PROTONIX) 40 MG tablet Take 1 tablet (40 mg total)  by mouth 2 (two) times daily before a meal. 09/20/19 09/19/20 Yes Nandigam, Venia Minks, MD  sucralfate (CARAFATE) 1 g tablet Take 1 tablet (1 g total) by mouth 4 (four) times daily. 09/20/19 09/19/20 Yes [name], MD      Allergies  Allergen Reactions  . Penicillins Anaphylaxis, Itching, Swelling and Other (See Comments)    Did it involve swelling of the face/tongue/throat, SOB, or low BP? Yes Did it involve sudden or severe rash/hives, skin peeling, or any reaction on the inside of your mouth or nose? No Did you need to seek medical attention at a hospital or doctor's office? Yes When did it last happen?Many years ago If all above answers are "NO", may proceed  with cephalosporin use.   . Sulfa Antibiotics Anaphylaxis and Swelling  . Versed [Midazolam] Other (See Comments)    Confusion, delerium    ROS:  Out of a complete 14 system review of symptoms, the patient complains only of the following symptoms, and all other reviewed systems are negative.  Walking difficulty Memory problems, hallucinations   Blood pressure 130/63, pulse (!) 59, weight 135 lb (61.2 kg).  Physical Exam  General: The patient is alert and cooperative at the time of the examination.  Eyes: Pupils are equal, round, and reactive to light. Discs are flat bilaterally.  Neck: The neck is supple, no carotid bruits are noted.  Respiratory: The respiratory examination is clear.  Cardiovascular: The cardiovascular examination reveals a regular rate and rhythm, no obvious murmurs or rubs are noted.  Skin: Extremities are without significant edema.  Neurologic Exam  Mental status: The patient is alert and oriented x 3 at the time of the examination. The Mini-Mental status examination done today shows a total score of 28/30.  The patient is able to name 4 four-legged animals and 60 seconds.  Cranial nerves: Facial symmetry is present. There is good sensation of the face to pinprick and soft touch  bilaterally. The strength of the facial muscles and the muscles to head turning and shoulder shrug are normal bilaterally. Speech is well enunciated, no aphasia or dysarthria is noted. Extraocular movements are full. Visual fields are full. The tongue is midline, and the patient has symmetric elevation of the soft palate. No obvious hearing deficits are noted.  Mild masking the face is seen.  Motor: The motor testing reveals 5 over 5 strength of all 4 extremities, with exception of 4/5 strength with hip flexion bilaterally. Good symmetric motor tone is noted throughout.  Sensory: Sensory testing is intact to pinprick, soft touch, vibration sensation, and position sense on all 4 extremities, with exception of some decreased pinprick sensation on the right arm and right leg relative to the left. No evidence of extinction is noted.  Coordination: Cerebellar testing reveals good finger-nose-finger bilaterally.  The patient has severe apraxia with use of the lower extremities, he cannot perform heel-to-shin on either side.  Gait and station: The patient has some difficulty arising from a seated position.  Once up, he is demonstrating a shuffling gait, particular with turns, stooped posture is noted.  The patient has decreased but symmetric arm swing.  Tandem gait was not attempted.  Romberg is negative.  Reflexes: Deep tendon reflexes are symmetric and normal bilaterally, with exception of some decreased ankle jerk reflexes bilaterally. Toes are downgoing bilaterally.   CT head 10/04/19:  IMPRESSION: 1. Generalized cerebral atrophy. 2. No acute intracranial abnormality.  * CT scan images were reviewed online. I agree with the written report.    Assessment/Plan:  1.  Memory disturbance  2.  Gait disturbance  The patient has had some difficulty with memory for several years, he has also developed a gait disturbance that does have some features of parkinsonism.  The possibility of a Lewy body  dementia does need to be considered.  The patient however has at least a moderate level of small vessel disease that could be an additive factor with his memory issues and walking problems.  The patient will be set up for home health physical therapy.  He does not wish to start medication for memory currently.  The patient will have blood work done today.  He will follow-up here in 6 months, we will watch  for developing signs of parkinsonism.  Jill Alexanders MD 12/05/2019 10:28 AM  [facility] [address] [place],  [address]  Phone [phone] Fax [phone]

## 2019-12-05 NOTE — Addendum Note (Signed)
Addended by: Kathrynn Ducking on: 12/05/2019 03:02 PM   Modules accepted: Orders

## 2019-12-05 NOTE — Telephone Encounter (Signed)
Daughter called wanting to know if Dr. Jannifer Franklin can add order to check testosterone level as well. Urologist said this would need to also be checked and trying to avoid him being stuck again if possible. Advised I will send message to MD and we will call back if we cannot add. She verbalized understanding.

## 2019-12-05 NOTE — Telephone Encounter (Signed)
I will try to add the testosterone level to the blood work.

## 2019-12-07 LAB — RPR: RPR Ser Ql: NONREACTIVE

## 2019-12-07 LAB — PSA, TOTAL AND FREE
PSA, Free: <0.02 ng/mL
Prostate Specific Ag, Serum: <0.1 ng/mL (ref 0.0–4.0)

## 2019-12-07 LAB — VITAMIN B12: Vitamin B-12: 329 pg/mL (ref 232–1245)

## 2019-12-07 LAB — COPPER, SERUM: Copper: 106 ug/dL (ref 69–132)

## 2019-12-07 LAB — SEDIMENTATION RATE: Sed Rate: 2 mm/hr (ref 0–30)

## 2019-12-12 DIAGNOSIS — C61 Malignant neoplasm of prostate: Secondary | ICD-10-CM | POA: Diagnosis not present

## 2019-12-12 DIAGNOSIS — R351 Nocturia: Secondary | ICD-10-CM | POA: Diagnosis not present

## 2019-12-13 ENCOUNTER — Encounter: Payer: Self-pay | Admitting: Cardiovascular Disease

## 2019-12-13 ENCOUNTER — Other Ambulatory Visit: Payer: Self-pay

## 2019-12-13 ENCOUNTER — Ambulatory Visit (INDEPENDENT_AMBULATORY_CARE_PROVIDER_SITE_OTHER): Payer: Medicare Other | Admitting: Cardiovascular Disease

## 2019-12-13 VITALS — BP 136/68 | HR 71 | Ht 65.0 in | Wt 136.0 lb

## 2019-12-13 DIAGNOSIS — E782 Mixed hyperlipidemia: Secondary | ICD-10-CM | POA: Diagnosis not present

## 2019-12-13 DIAGNOSIS — Z952 Presence of prosthetic heart valve: Secondary | ICD-10-CM | POA: Diagnosis not present

## 2019-12-13 DIAGNOSIS — I10 Essential (primary) hypertension: Secondary | ICD-10-CM | POA: Diagnosis not present

## 2019-12-13 DIAGNOSIS — I480 Paroxysmal atrial fibrillation: Secondary | ICD-10-CM | POA: Diagnosis not present

## 2019-12-13 DIAGNOSIS — I739 Peripheral vascular disease, unspecified: Secondary | ICD-10-CM

## 2019-12-13 NOTE — Assessment & Plan Note (Signed)
History of severe aortic stenosis status post TAVR by Drs. Cooper and [place].  Cardiac catheterization performed by myself 01/25/2018 revealed critical aortic stenosis with normal coronary arteries.  Last echo performed in September revealed his TAVR to be working normally with normal LV function.  He denies chest pain or shortness of breath.

## 2019-12-13 NOTE — Progress Notes (Signed)
12/13/2019 Robert Schaefer   [birth year]  [id number]  Primary Physician [name], MD Primary Cardiologist: [name] MD Lupe Carney, [place]  HPI:  Robert Schaefer is a 79 y.o.  married Caucasian male father of 2 children, grandfather 5 grandchildren who is accompanied by his wife Bonnietoday. He was referred by Dr. Melford Aase for cardiovascular evaluation because of chest pain and claudication. I last saw him in the office  05/24/2019. His cardiovascular risk factor profile is notable for 100 pack years of tobacco abuse having quit 10 years ago. He has treated hypertension and hyperlipidemia. There is a family history of heart disease with a mother who died of a myocardial infarction age 12. He has never had a heart attack or stroke. He has had recent chest pain with a CT angiogram that ruled out pulmonary embolus but did show diffuse coronary calcification. An abdominal pelvic CT performed because of unexplained weight loss showed aortoiliac calcification size/atherosclerosis as well as atherosclerosis of the superior mesenteric artery and left renal artery. He complains of right greater than leftleg claudication of the last year occurring after walking 4-5 minutes. Since I saw him in the office he's had a Myoview stress test which was nonischemic problem, a 2-D echo that showed normal LV function with moderate aortic stenosis, carotid Dopplers that showed no evidence of ICA stenosis and lower extremity Dopplers reveal a right ABI of 0.48 with an occluded right common iliac and a left ABI of 1.23 with moderate left common iliac artery stenosis. He also complains a 30 pound weight loss of and had SMA calcification on abdominal CT. He may have mesenteric ischemia as well.He underwent peripheral angiography and intervention by myself 05/29/16 demonstrating a small abdominal aortic aneurysm, 95% calcified ostial superior mesenteric artery stenosis, right common iliac chronic total  occlusion was calcified and high-grade common iliac artery stenosis. He underwent diamondback orbital rotational atherectomy, PTA and covered stenting using Lifestream covered stents" of both common iliac arteries using "kissing stent technique. His claudication symptoms have resolved. The ABIs are normal. She does have a high-frequency signal in his right common iliac artery demonstrated a duplex ultrasound 06/09/16 was asymptomatic from this.  I did his heart cath 01/25/2018 which revealed critical left ear, normal coronary arteries and widely patent iliac stents he underwent a transapical TAVR procedure by Drs. Cooper and [place] with an excellent clinical result. His follow-up echo revealed a normally functioning bioprosthetic aortic prosthesis, normal LV function and a perivalvular leak with mild to moderate AI.[name] He is clinically improved. He was noted to be in PAF transiently during his hospitalization converting to sinus rhythm and he was begun on low-dose Eliquis.  Since I saw him several months ago he continues to do well.  He is ambulating without difficulty and specifically denies chest pain, shortness of breath or claudication.  His most recent Dopplers performed 06/20/2019 revealed widely patent iliac stents with small abdominal cavity measuring 3.2 cm.  His last 2D echo performed in September revealed his TAVR to be widely patent.    Current Meds  Medication Sig  . albuterol (PROVENTIL HFA;VENTOLIN HFA) 108 (90 Base) MCG/ACT inhaler Inhale 1 puff into the lungs every 6 (six) hours as needed for wheezing or shortness of breath.  [name] aspirin EC 81 MG tablet Take 81 mg by mouth daily.  . Cholecalciferol (VITAMIN D-3) 5000 units TABS Take 5,000 Units by mouth daily at 3 pm.  . feeding supplement, ENSURE ENLIVE, (ENSURE ENLIVE) LIQD  Take 237 mLs by mouth 5 (five) times daily.   . fluticasone (FLONASE) 50 MCG/ACT nasal spray Place 2 sprays into both nostrils daily as needed for allergies or  rhinitis.   [name] levothyroxine (SYNTHROID) 50 MCG tablet Take 1 tablet daily on an empty stomach with only water for 30 minutes & no Antacid meds, Calcium or Magnesium for 4 hours & avoid Biotin  . pantoprazole (PROTONIX) 40 MG tablet Take 1 tablet (40 mg total) by mouth 2 (two) times daily before a meal.  . sucralfate (CARAFATE) 1 g tablet Take 1 tablet (1 g total) by mouth 4 (four) times daily.     Allergies  Allergen Reactions  . Penicillins Anaphylaxis, Itching, Swelling and Other (See Comments)    Did it involve swelling of the face/tongue/throat, SOB, or low BP? Yes Did it involve sudden or severe rash/hives, skin peeling, or any reaction on the inside of your mouth or nose? No Did you need to seek medical attention at a hospital or doctor's office? Yes When did it last happen?Many years ago If all above answers are "NO", may proceed with cephalosporin use.   . Sulfa Antibiotics Anaphylaxis and Swelling  . Versed [Midazolam] Other (See Comments)    Confusion, delerium    Social History   Socioeconomic History  . Marital status: Married    Spouse name: Not on file  . Number of children: Not on file  . Years of education: Not on file  . Highest education level: Not on file  Occupational History  . Not on file  Tobacco Use  . Smoking status: Former Smoker    Packs/day: 1.00    Years: 50.00    Pack years: 50.00    Types: Cigarettes    Quit date: 07/05/2005    Years since quitting: 14.4  . Smokeless tobacco: Never Used  Vaping Use  . Vaping Use: Never used  Substance and Sexual Activity  . Alcohol use: No  . Drug use: Never  . Sexual activity: Not Currently  Other Topics Concern  . Not on file  Social History Narrative  . Not on file   Social Determinants of Health   Financial Resource Strain:   . Difficulty of Paying Living Expenses:   Food Insecurity:   . Worried About Charity fundraiser in the Last Year:   . Arboriculturist in the Last Year:   Transportation  Needs:   . Film/video editor (Medical):   [name] Lack of Transportation (Non-Medical):   Physical Activity:   . Days of Exercise per Week:   . Minutes of Exercise per Session:   Stress:   . Feeling of Stress :   Social Connections:   . Frequency of Communication with Friends and Family:   . Frequency of Social Gatherings with Friends and Family:   . Attends Religious Services:   . Active Member of Clubs or Organizations:   . Attends Archivist Meetings:   [name] Marital Status:   Intimate Partner Violence:   . Fear of Current or Ex-Partner:   . Emotionally Abused:   [name] Physically Abused:   . Sexually Abused:      Review of Systems: General: negative for chills, fever, night sweats or weight changes.  Cardiovascular: negative for chest pain, dyspnea on exertion, edema, orthopnea, palpitations, paroxysmal nocturnal dyspnea or shortness of breath Dermatological: negative for rash Respiratory: negative for cough or wheezing Urologic: negative for hematuria Abdominal: negative for nausea, vomiting, diarrhea, bright  red blood per rectum, melena, or hematemesis Neurologic: negative for visual changes, syncope, or dizziness All other systems reviewed and are otherwise negative except as noted above.    Blood pressure 136/68, pulse 71, height 5\' 5"  (1.651 m), weight 136 lb (61.7 kg), SpO2 94 %.  General appearance: alert and no distress Neck: no adenopathy, no carotid bruit, no JVD, supple, symmetrical, trachea midline and thyroid not enlarged, symmetric, no tenderness/mass/nodules Lungs: clear to auscultation bilaterally Heart: regular rate and rhythm, S1, S2 normal, no murmur, click, rub or gallop Extremities: extremities normal, atraumatic, no cyanosis or edema Pulses: 2+ and symmetric Skin: Skin color, texture, turgor normal. No rashes or lesions Neurologic: Alert and oriented X 3, normal strength and tone. Normal symmetric reflexes. Normal coordination and gait  EKG sinus  rhythm at 71 with left bundle branch block.  I personally reviewed this EKG.  ASSESSMENT AND PLAN:   Essential hypertension History of essential hypertension with blood pressure measured today 136/68.  He is not on antihypertensive medications.  Hyperlipidemia, mixed History of mild hyperlipidemia not on statin therapy with lipid profile performed 07/11/2019 revealing total cholesterol 172, LDL 92 and HDL 64.  PVD (peripheral vascular disease) ([place]) History of peripheral arterial disease with calcified left iliac and an occluded right iliac status post orbital atherectomy, PTA and covered stenting using "kissing stent technique" by myself 05/29/2016.  Claudication has resolved.  His most recent Dopplers performed 06/20/2019 revealed these to be widely patent with a 3.2 cm abdominal aortic aneurysm which we are following as well.  He also had a 95% superior mesenteric artery stenosis but denies mesenteric ischemia and is actually gaining weight.  He also had a left renal artery stenosis as well  S/P TAVR (transcatheter aortic valve replacement) History of severe aortic stenosis status post TAVR by Drs. Cooper and [place].  Cardiac catheterization performed by myself 01/25/2018 revealed critical aortic stenosis with normal coronary arteries.  Last echo performed in September revealed his TAVR to be working normally with normal LV function.  He denies chest pain or shortness of breath.  Paroxysmal atrial fibrillation (HCC) History of PAF at the time of his TAVR up without recurrence.  He is not on oral anticoagulation.      [name] MD FACP,FACC,FAHA, [facility] 12/13/2019 3:09 PM

## 2019-12-13 NOTE — Assessment & Plan Note (Signed)
History of essential hypertension with blood pressure measured today 136/68.  He is not on antihypertensive medications.

## 2019-12-13 NOTE — Assessment & Plan Note (Signed)
History of peripheral arterial disease with calcified left iliac and an occluded right iliac status post orbital atherectomy, PTA and covered stenting using "kissing stent technique" by myself 05/29/2016.  Claudication has resolved.  His most recent Dopplers performed 06/20/2019 revealed these to be widely patent with a 3.2 cm abdominal aortic aneurysm which we are following as well.  He also had a 95% superior mesenteric artery stenosis but denies mesenteric ischemia and is actually gaining weight.  He also had a left renal artery stenosis as well

## 2019-12-13 NOTE — Assessment & Plan Note (Signed)
History of PAF at the time of his TAVR up without recurrence.  He is not on oral anticoagulation.

## 2019-12-13 NOTE — Assessment & Plan Note (Signed)
History of mild hyperlipidemia not on statin therapy with lipid profile performed 07/11/2019 revealing total cholesterol 172, LDL 92 and HDL 64.

## 2019-12-13 NOTE — Patient Instructions (Addendum)
Medication Instructions:  Your Physician recommend you continue on your current medication as directed.    *If you need a refill on your cardiac medications before your next appointment, please call your pharmacy*   Lab Work: None   Testing/Procedures: Your physician has requested that you have an echocardiogram in Septemeber 2021. Echocardiography is a painless test that uses sound waves to create images of your heart. It provides your doctor with information about the size and shape of your heart and how well your heart's chambers and valves are working. This procedure takes approximately one hour. There are no restrictions for this procedure. [place] has requested that you have an ankle brachial index (ABI) in December 2021. During this test an ultrasound and blood pressure cuff are used to evaluate the arteries that supply the arms and legs with blood. Allow thirty minutes for this exam. There are no restrictions or special instructions. [place]  Your physician has requested that you have a lower extremity arterial exercise duplex in December 2021. During this test, exercise and ultrasound are used to evaluate arterial blood flow in the legs. Allow one hour for this exam. There are no restrictions or special instructions. [place]. [address]      Follow-Up: At [facility], you and your health needs are our priority.  As part of our continuing mission to provide you with exceptional heart care, we have created designated Provider Care Teams.  These Care Teams include your primary Cardiologist (physician) and Advanced Practice Providers (APPs -  Physician Assistants and Nurse Practitioners) who all work together to provide you with the care you need, when you need it.  We recommend signing up for the patient portal called "MyChart".  Sign up information is provided on this After Visit Summary.  MyChart is used to  connect with patients for Virtual Visits (Telemedicine).  Patients are able to view lab/test results, encounter notes, upcoming appointments, etc.  Non-urgent messages can be sent to your provider as well.   To learn more about what you can do with MyChart, go to [email or web address].    Your next appointment:   6 month(s)  The format for your next appointment:   In Person  Provider:   Quay Burow, MD

## 2019-12-14 ENCOUNTER — Telehealth: Payer: Self-pay | Admitting: Neurology

## 2019-12-14 NOTE — Telephone Encounter (Signed)
Almyra called from [facility] and relayed she had been trying to get in touch with patient for home PT [phone] . I called patient and spoke to him he did not answer because he did not know the number patient will call Almyara to schedule home PT.

## 2019-12-15 LAB — SPECIMEN STATUS REPORT

## 2019-12-22 DIAGNOSIS — I480 Paroxysmal atrial fibrillation: Secondary | ICD-10-CM | POA: Diagnosis not present

## 2019-12-22 DIAGNOSIS — I10 Essential (primary) hypertension: Secondary | ICD-10-CM | POA: Diagnosis not present

## 2019-12-22 DIAGNOSIS — E1151 Type 2 diabetes mellitus with diabetic peripheral angiopathy without gangrene: Secondary | ICD-10-CM | POA: Diagnosis not present

## 2019-12-22 DIAGNOSIS — Z952 Presence of prosthetic heart valve: Secondary | ICD-10-CM | POA: Diagnosis not present

## 2019-12-22 DIAGNOSIS — R269 Unspecified abnormalities of gait and mobility: Secondary | ICD-10-CM | POA: Diagnosis not present

## 2019-12-22 DIAGNOSIS — Z9181 History of falling: Secondary | ICD-10-CM | POA: Diagnosis not present

## 2019-12-22 DIAGNOSIS — G4733 Obstructive sleep apnea (adult) (pediatric): Secondary | ICD-10-CM | POA: Diagnosis not present

## 2019-12-22 DIAGNOSIS — J449 Chronic obstructive pulmonary disease, unspecified: Secondary | ICD-10-CM | POA: Diagnosis not present

## 2019-12-22 DIAGNOSIS — I6523 Occlusion and stenosis of bilateral carotid arteries: Secondary | ICD-10-CM | POA: Diagnosis not present

## 2019-12-22 DIAGNOSIS — Z87891 Personal history of nicotine dependence: Secondary | ICD-10-CM | POA: Diagnosis not present

## 2019-12-22 DIAGNOSIS — F0390 Unspecified dementia without behavioral disturbance: Secondary | ICD-10-CM | POA: Diagnosis not present

## 2019-12-22 DIAGNOSIS — I6789 Other cerebrovascular disease: Secondary | ICD-10-CM | POA: Diagnosis not present

## 2019-12-26 DIAGNOSIS — R269 Unspecified abnormalities of gait and mobility: Secondary | ICD-10-CM | POA: Diagnosis not present

## 2019-12-26 DIAGNOSIS — E1151 Type 2 diabetes mellitus with diabetic peripheral angiopathy without gangrene: Secondary | ICD-10-CM | POA: Diagnosis not present

## 2019-12-26 DIAGNOSIS — F0390 Unspecified dementia without behavioral disturbance: Secondary | ICD-10-CM | POA: Diagnosis not present

## 2019-12-26 DIAGNOSIS — J449 Chronic obstructive pulmonary disease, unspecified: Secondary | ICD-10-CM | POA: Diagnosis not present

## 2019-12-26 DIAGNOSIS — I480 Paroxysmal atrial fibrillation: Secondary | ICD-10-CM | POA: Diagnosis not present

## 2019-12-26 DIAGNOSIS — I10 Essential (primary) hypertension: Secondary | ICD-10-CM | POA: Diagnosis not present

## 2019-12-28 DIAGNOSIS — I10 Essential (primary) hypertension: Secondary | ICD-10-CM | POA: Diagnosis not present

## 2019-12-28 DIAGNOSIS — E1151 Type 2 diabetes mellitus with diabetic peripheral angiopathy without gangrene: Secondary | ICD-10-CM | POA: Diagnosis not present

## 2019-12-28 DIAGNOSIS — I480 Paroxysmal atrial fibrillation: Secondary | ICD-10-CM | POA: Diagnosis not present

## 2019-12-28 DIAGNOSIS — R269 Unspecified abnormalities of gait and mobility: Secondary | ICD-10-CM | POA: Diagnosis not present

## 2019-12-28 DIAGNOSIS — J449 Chronic obstructive pulmonary disease, unspecified: Secondary | ICD-10-CM | POA: Diagnosis not present

## 2019-12-28 DIAGNOSIS — F0390 Unspecified dementia without behavioral disturbance: Secondary | ICD-10-CM | POA: Diagnosis not present

## 2019-12-30 LAB — SPECIMEN STATUS REPORT

## 2019-12-30 LAB — TESTOSTERONE: Testosterone: 175 ng/dL — ABNORMAL LOW (ref 264–916)

## 2020-01-03 DIAGNOSIS — R269 Unspecified abnormalities of gait and mobility: Secondary | ICD-10-CM | POA: Diagnosis not present

## 2020-01-03 DIAGNOSIS — E1151 Type 2 diabetes mellitus with diabetic peripheral angiopathy without gangrene: Secondary | ICD-10-CM | POA: Diagnosis not present

## 2020-01-03 DIAGNOSIS — F0390 Unspecified dementia without behavioral disturbance: Secondary | ICD-10-CM | POA: Diagnosis not present

## 2020-01-03 DIAGNOSIS — I480 Paroxysmal atrial fibrillation: Secondary | ICD-10-CM | POA: Diagnosis not present

## 2020-01-03 DIAGNOSIS — I10 Essential (primary) hypertension: Secondary | ICD-10-CM | POA: Diagnosis not present

## 2020-01-03 DIAGNOSIS — J449 Chronic obstructive pulmonary disease, unspecified: Secondary | ICD-10-CM | POA: Diagnosis not present

## 2020-01-10 ENCOUNTER — Other Ambulatory Visit: Payer: Self-pay | Admitting: Gastroenterology

## 2020-01-10 DIAGNOSIS — J449 Chronic obstructive pulmonary disease, unspecified: Secondary | ICD-10-CM | POA: Diagnosis not present

## 2020-01-10 DIAGNOSIS — I480 Paroxysmal atrial fibrillation: Secondary | ICD-10-CM | POA: Diagnosis not present

## 2020-01-10 DIAGNOSIS — F0390 Unspecified dementia without behavioral disturbance: Secondary | ICD-10-CM | POA: Diagnosis not present

## 2020-01-10 DIAGNOSIS — I10 Essential (primary) hypertension: Secondary | ICD-10-CM | POA: Diagnosis not present

## 2020-01-10 DIAGNOSIS — R269 Unspecified abnormalities of gait and mobility: Secondary | ICD-10-CM | POA: Diagnosis not present

## 2020-01-10 DIAGNOSIS — E1151 Type 2 diabetes mellitus with diabetic peripheral angiopathy without gangrene: Secondary | ICD-10-CM | POA: Diagnosis not present

## 2020-01-12 DIAGNOSIS — J449 Chronic obstructive pulmonary disease, unspecified: Secondary | ICD-10-CM | POA: Diagnosis not present

## 2020-01-12 DIAGNOSIS — I480 Paroxysmal atrial fibrillation: Secondary | ICD-10-CM | POA: Diagnosis not present

## 2020-01-12 DIAGNOSIS — F0390 Unspecified dementia without behavioral disturbance: Secondary | ICD-10-CM | POA: Diagnosis not present

## 2020-01-12 DIAGNOSIS — R269 Unspecified abnormalities of gait and mobility: Secondary | ICD-10-CM | POA: Diagnosis not present

## 2020-01-12 DIAGNOSIS — I10 Essential (primary) hypertension: Secondary | ICD-10-CM | POA: Diagnosis not present

## 2020-01-12 DIAGNOSIS — E1151 Type 2 diabetes mellitus with diabetic peripheral angiopathy without gangrene: Secondary | ICD-10-CM | POA: Diagnosis not present

## 2020-01-13 NOTE — Progress Notes (Deleted)
3 MONTHS FOLLOW UP  Assessment:    Essential hypertension - fairly controlled off of medications, DASH diet, exercise and monitor at home. Call if greater than 130/80.  -     CBC with Differential/Platelet -     CMP/GFR -     TSH  PVD (peripheral vascular disease) (HCC) Control blood pressure, cholesterol, glucose, increase exercise.   PAF ([place]) Monitoring only, off of elequis due to risk factors; follows with cardiology.   Atherosclerosis of aorta (HCC) Control blood pressure, cholesterol, glucose, increase exercise.   Hypothyroidism, unspecified type Hypothyroidism-check TSH level, continue medications the same, reminded to take on an empty stomach 30-22mins before food.  -     TSH  Pure hypercholesterolemia -continue medications, check lipids at routine follow ups, decrease fatty foods, increase activity.   OSA and COPD overlap syndrome ([place]) Not on CPAP, states can not wear, STRONGLY SUGGEST getting on it with heart/lung history, given information and will consider  COPD with asthma ([place]) Not on CPAP, states can not wear, STRONGLY SUGGEST getting on it with heart/lung history, given information and will consider Follow up pulmonary  Cavitating mass in left upper lung lobe Follow up pulmonary   Vitamin D deficiency Continue supplement  Prostate cancer ([place]) Continue follow up Following with Dr. [name]  Esophageal stricture/dysphagia Improved s/p esophageal dilation; continue PPI, carafate;   Severe protein calorie malnutrition ([place]) Check CMP, continue   Dementia ([place]) Normal labs, cerebral atrophy on CT 09/2019 Dr. Jannifer Franklin following for possible Parkinson's Family assists ADLs  Unilateral R sided blurry vs double vision *** Unclear, appears simple blurry vision following last glasses change, intermittent x 8-9 months  Getting CT head as per above Encouraged follow up with vision provider for glasses check   Over 30 minutes of exam, counseling, chart  review and critical decision making was performed   Future Appointments  Date Time Provider [place]  01/16/2020 11:00 AM [name], MD [facility] None  03/14/2020  3:45 PM [facility] [place] E[facility] 3 [facility]  06/06/2020  3:45 PM Suzzanne Cloud, NP [facility] None  06/07/2020  9:00 AM [facility] VASC 2 [facility]  06/07/2020 10:00 AM [facility] NL VASC 2 [facility]  06/13/2020 11:30 AM [name], MD [facility]  07/30/2020  2:00 PM [name], MD [facility] None     Plan:   During the course of the visit the patient was educated and counseled about appropriate screening and preventive services including:    Pneumococcal vaccine  Prevnar 13   Influenza vaccine  Td vaccine  Screening electrocardiogram  Bone densitometry screening  Colorectal cancer screening  Diabetes screening  Glaucoma screening  Nutrition counseling   Advanced directives: requested   Subjective:  Robert Schaefer is a 79 y.o. male who presents for Medicare Annual Wellness Visit and follow up  He has had memory concerns for several years, intermittent in flares, has good days and bad days, have suspected vascular etiology, normal labs, Ct head 09/2019 showed chronic microvascular changes and generlized cerebral atrophy. Has had some agitation, some ? Visual hallucinations, sees people in house and will call 911 ***. Also with unsteady gait using walker, saw Dr. Jannifer Franklin June 2021 who felt possible Parkinsons features, patient declined meds but will be following up in 6 months.   reports intermittent R eye double vision worse in poor lighting ongoing for 8-9 months since last new glasses    Per family at baseline and himself they are managing well, lives  with wife, daughter [name] checks on them daily, she drives to and from appointments. Patient no longer drives. Wife heats up food.  Wife assists with hygiene, needs to remind and supervise dressing/bathing. Looking  into hiring home health, working with a [facility].   Patient has hx/o OSA but is not on CPAP due to mask intolerance. Patient has hx/o a cavitary LUL lung lesion followed by Dr [name] with lesion resolving & felt due to infectious process. Patient has hx/o GERD& Achalasia & esophageal stricture dilated by Dr Silverio Decamp. Recently in 08/26/2019 presented to ED with esophageal obstruction, had botox injection and underwent EGD 08/2019 which benign appearing esophageal stricture which was dilated. He is on protonix 40 mg BID, carafate; taking ensure. Per family has had no difficulty eating since this procedure. He was well immediately after procedure for several days prior to onset of current sx. ***    He has had elevated blood pressure. His blood pressure has been controlled at home, today their BP is   He does not workout. He denies chest pain, dizziness, some exertional dyspnea, consistent with baseline with known COPD.  History of PAD s/p  intervention with Dr. [name] to right common iliac and mesenteric. He had severe AS and underwent TAVR by Dr. Argentina Ponder and did well. He had normal stress test 03/2016. He has COPD, is ex smoker, ~100 pack year history, quit 10 years ago. He has had PAF noted during admissions, follows with Dr. [name], was on low dose elequis but this was discontinued following 14 day holter recently which did not show a. Fib. he did have sinus bradycardia in the 30s with 3-second pauses. Known LBBB. Has been referred to EP, Dr. Curt Bears is following.    There is no height or weight on file to calculate BMI., he is working on diet and exercise. Taking ensure due to dysphagia and protein calorie malnutrition.  Wt Readings from Last 3 Encounters:  12/13/19 136 lb (61.7 kg)  12/05/19 135 lb (61.2 kg)  10/03/19 126 lb (57.2 kg)   He is following with Dr. [name] for prostate cancer, seeing every 3-6 months.   He is not on cholesterol medication and denies myalgias. His cholesterol is  not at goal of 70 or less but not aggressively pursued due protein/calorie malnutrition.  The cholesterol last visit was:   Lab Results  Component Value Date   CHOL 172 07/11/2019   HDL 64 07/11/2019   LDLCALC 92 07/11/2019   TRIG 75 07/11/2019   CHOLHDL 2.7 07/11/2019   He has had prediabetes for years recently resolve to normal range with protein/calorie malnutrition consequent of dementia and esophageal stricture.   Last A1C in the office was:  Lab Results  Component Value Date   HGBA1C 4.9 07/11/2019   Last GFR: Lab Results  Component Value Date   GFRNONAA 82 10/03/2019   Patient is on Vitamin D supplement, taking 5000 IU a day.   Lab Results  Component Value Date   VD25OH 37 07/11/2019   He is on thyroid medication. His medication was not changed last visit.   Lab Results  Component Value Date   TSH 1.46 10/03/2019  .  *** Lab Results  Component Value Date   VITAMINB12 329 12/05/2019      Medication Review: Current Outpatient Medications on File Prior to Visit  Medication Sig Dispense Refill  . albuterol (PROVENTIL HFA;VENTOLIN HFA) 108 (90 Base) MCG/ACT inhaler Inhale 1 puff into the lungs every 6 (six) hours as  needed for wheezing or shortness of breath.    [name] aspirin EC 81 MG tablet Take 81 mg by mouth daily.    . Cholecalciferol (VITAMIN D-3) 5000 units TABS Take 5,000 Units by mouth daily at 3 pm.    . feeding supplement, ENSURE ENLIVE, (ENSURE ENLIVE) LIQD Take 237 mLs by mouth 5 (five) times daily.     . fluticasone (FLONASE) 50 MCG/ACT nasal spray Place 2 sprays into both nostrils daily as needed for allergies or rhinitis.     [name] levothyroxine (SYNTHROID) 50 MCG tablet Take 1 tablet daily on an empty stomach with only water for 30 minutes & no Antacid meds, Calcium or Magnesium for 4 hours & avoid Biotin 90 tablet 3  . pantoprazole (PROTONIX) 40 MG tablet Take 1 tablet (40 mg total) by mouth 2 (two) times daily before a meal. 30 tablet 11  . sucralfate  (CARAFATE) 1 g tablet Take 1 tablet by mouth 4 times daily 120 tablet 0   No current facility-administered medications on file prior to visit.    Current Problems (verified) Patient Active Problem List   Diagnosis Date Noted  . Gait abnormality 12/05/2019  . Dementia ([place]) 10/03/2019  . COPD (chronic obstructive pulmonary disease) ([place])   . Esophageal obstruction due to food impaction   . Esophageal diverticulum   . Sinus bradycardia 05/24/2019  . Abnormal glucose 12/09/2018  . Esophageal diverticular disease   . Loss of weight   . Severe protein-calorie malnutrition ([place])   . Paroxysmal atrial fibrillation ([place]) 06/15/2018  . S/P TAVR (transcatheter aortic valve replacement) 03/16/2018  . Prostate cancer ([place])   . OSA (obstructive sleep apnea)   . Achalasia   . Severe aortic stenosis   . Esophageal dysphagia   . Esophageal stricture   . Atherosclerosis of aorta ([place]) 09/15/2016  . Cavitating mass in left upper lung lobe 08/12/2016  . PVD (peripheral vascular disease) ([place]) 05/31/2016  . Hypothyroidism 11/07/2014  . BPH w/LUTS 11/07/2014  . Medication management 04/11/2014  . Vitamin D deficiency 07/05/2013  . Hyperlipidemia, mixed   . RLS (restless legs syndrome)   . Essential hypertension 09/28/2009  . GERD 09/28/2009    Allergies Allergies  Allergen Reactions  . Penicillins Anaphylaxis, Itching, Swelling and Other (See Comments)    Did it involve swelling of the face/tongue/throat, SOB, or low BP? Yes Did it involve sudden or severe rash/hives, skin peeling, or any reaction on the inside of your mouth or nose? No Did you need to seek medical attention at a hospital or doctor's office? Yes When did it last happen?Many years ago If all above answers are "NO", may proceed with cephalosporin use.   . Sulfa Antibiotics Anaphylaxis and Swelling  . Versed [Midazolam] Other (See Comments)    Confusion, delerium    SURGICAL HISTORY He  has a past surgical history  that includes Colonoscopy; Pilonidal cyst / sinus excision (2000); Insertion of mesh (Right, 02/23/2014); Inguinal hernia repair (Right, 02/23/2014); Hernia repair; Cataract extraction w/ intraocular lens  implant, bilateral (Bilateral); Esophagogastroduodenoscopy (egd) with esophageal dilation (last one 11-09-2015  dr Yolanda Bonine); Cardiac catheterization (N/A, 05/29/2016); Prostate biopsy (09-22-2016  dr [name] office); Cardiovascular stress test (04-23-2016   dr berry); transthoracic echocardiogram (05-05-2016   dr berry); Gold seed implant (N/A, 04/29/2017); SPACE OAR INSTILLATION (N/A, 04/29/2017); Upper gastrointestinal endoscopy; Esophageal manometry (N/A, 10/12/2017); Esophagogastroduodenoscopy (N/A, 10/12/2017); Savory dilation (N/A, 10/12/2017); Cardiac catheterization (Bilateral, 01/25/2018); Anal fistulectomy (2010); RIGHT/LEFT HEART CATH AND CORONARY ANGIOGRAPHY (N/A, 01/25/2018); ABDOMINAL AORTOGRAM (N/A,  01/25/2018); Transcatheter aortic valve replacement, transapical (Left, 03/16/2018); Esophagogastroduodenoscopy (egd) with propofol (N/A, 11/17/2018); Savory dilation (N/A, 11/17/2018); Balloon dilation (N/A, 11/17/2018); Foreign Body Removal (11/17/2018); Esophagogastroduodenoscopy (egd) with propofol (N/A, 08/26/2019); Botox injection (08/26/2019); Foreign Body Removal (08/26/2019); Esophagogastroduodenoscopy (egd) with propofol (N/A, 09/20/2019); Balloon dilation (N/A, 09/20/2019); and Foreign Body Removal (09/20/2019). FAMILY HISTORY His family history includes Asthma in his father; Heart attack in his mother; Stroke in his father. SOCIAL HISTORY He  reports that he quit smoking about 14 years ago. His smoking use included cigarettes. He has a 50.00 pack-year smoking history. He has never used smokeless tobacco. He reports that he does not drink alcohol and does not use drugs.   *** Review of Systems  Constitutional: Negative for chills, fever and malaise/fatigue.  HENT: Negative for congestion and sore throat.    Eyes: Positive for double vision (he heports intermittent double vision of R eye "nearly 1 year" ongoing after getting new glasses). Negative for blurred vision.  Respiratory: Negative for cough, shortness of breath and wheezing.   Cardiovascular: Negative for chest pain, palpitations and leg swelling.  Gastrointestinal: Negative for abdominal pain, blood in stool, constipation, diarrhea, heartburn, melena, nausea and vomiting.  Genitourinary: Negative.   Musculoskeletal: Positive for falls. Negative for joint pain and myalgias.  Skin: Negative.   Neurological: Negative for dizziness, tingling, tremors, sensory change, speech change, focal weakness, loss of consciousness and headaches.  Psychiatric/Behavioral: Positive for hallucinations and memory loss (chronic ongoing dementia; worse in last 2 weeks). Negative for depression, substance abuse and suicidal ideas. The patient is not nervous/anxious and does not have insomnia.      Objective:     There were no vitals filed for this visit. There is no height or weight on file to calculate BMI.  General appearance: alert, no distress, WD/WN, male HEENT: normocephalic, sclerae anicteric, TMs pearly, nares patent, no discharge or erythema, pharynx normal Oral cavity: MMM, no lesions Neck: supple, no lymphadenopathy, no thyromegaly, no masses Heart: RRR, normal S1, S2, no murmur heard today.  Lungs: CTA , no wheezes, rhonchi, or rales, ? Mildly decreased in bil bases, R < L, some rales cleared with cough Abdomen: +bs, soft, non tender, non distended, no masses, no hepatomegaly, no splenomegaly Musculoskeletal: nontender, no swelling, no obvious deformity Extremities: no edema, no cyanosis, no clubbing Pulses: 2+ symmetric, upper and lower extremities, normal cap refill Neurological: alert, oriented x 3, CN2-12 intact, symmetrical strength normal upper extremities and lower extremities, sensation normal throughout, DTRs 2+ throughout, slow  rapid alternating, heel to shin, cannot stand without assistance for [place], gait is slow and shuffling with walker, poor short term recall Psychiatric: normal affect, behavior normal, pleasant     Izora Ribas, NP   01/13/2020

## 2020-01-15 ENCOUNTER — Encounter: Payer: Self-pay | Admitting: Internal Medicine

## 2020-01-15 NOTE — Patient Instructions (Signed)

## 2020-01-15 NOTE — Progress Notes (Signed)
History of Present Illness:       This very nice 79 y.o.  MWMpresents for 6 month follow up with HTN, HLD, Pre-Diabetes, Hypothyroidism and Vitamin D Deficiency. Patient's GERD/Achalasia  is controlled on his meds. Patient has OSA w/intolerance to CPAP mask.      Patient is treated for HTN (1995) & BP has been controlled at home.  Patient has hx/o pAfib. Today's BP is at  goal - 114/62. Heart Cath July 2019 by Dr [name] showed Nl coronary arteries and critical AoS, now status post TAVR (Sept 2019).  Also hx/o Lt Renal Aa stenosis per Dr [name]. Patient is s/p bilat Iliac Aa stents per Dr [name]. Patient has had no complaints of any cardiac type chest pain, palpitations, dyspnea / orthopnea / PND, dizziness, claudication, or dependent edema.      Hyperlipidemia is controlled with diet & meds. Patient denies myalgias or other med SE's. Last Lipids were at goal:  Lab Results  Component Value Date   CHOL 172 07/11/2019   HDL 64 07/11/2019   LDLCALC 92 07/11/2019   TRIG 75 07/11/2019   CHOLHDL 2.7 07/11/2019    Also, the patient has history of PreDiabetes (A1c 6.1% / 2002)  and has had no symptoms of reactive hypoglycemia, diabetic polys, paresthesias or visual blurring.  Last A1c was Normal & at goal:  Lab Results  Component Value Date   HGBA1C 4.9 07/11/2019            Also, patient was dx'd Hypothyroid in 2013 and initiated on Thyroid replacement.      Further, the patient also has history of Vitamin D Deficiency and supplements vitamin D sporadically. Last vitamin D was  very low:  Lab Results  Component Value Date   VD25OH 37 07/11/2019    Current Outpatient Medications on File Prior to Visit  Medication Sig  . albuterol (PROVENTIL HFA;VENTOLIN HFA) 108 (90 Base) MCG/ACT inhaler Inhale 1 puff into the lungs every 6 (six) hours as needed for wheezing or shortness of breath.  [name] aspirin EC 81 MG tablet Take 81 mg by mouth daily.  . Cholecalciferol (VITAMIN D-3) 5000 units TABS  Take 5,000 Units by mouth daily at 3 pm.  . feeding supplement, ENSURE ENLIVE, (ENSURE ENLIVE) LIQD Take 237 mLs by mouth 5 (five) times daily.   . fluticasone (FLONASE) 50 MCG/ACT nasal spray Place 2 sprays into both nostrils daily as needed for allergies or rhinitis.   [name] levothyroxine (SYNTHROID) 50 MCG tablet Take 1 tablet daily on an empty stomach with only water for 30 minutes & no Antacid meds, Calcium or Magnesium for 4 hours & avoid Biotin  . pantoprazole (PROTONIX) 40 MG tablet Take 1 tablet (40 mg total) by mouth 2 (two) times daily before a meal.  . sucralfate (CARAFATE) 1 g tablet Take 1 tablet by mouth 4 times daily   No current facility-administered medications on file prior to visit.    Allergies  Allergen Reactions  . Penicillins Anaphylaxis, Itching, Swelling and Other (See Comments)  . Sulfa Antibiotics Anaphylaxis and Swelling  . Versed [Midazolam] Other (See Comments)    Confusion, delerium    PMHx:   Past Medical History:  Diagnosis Date  . AAA (abdominal aortic aneurysm) ([place])    no longer monitoring was small area  . Achalasia   . Carotid stenosis, asymptomatic, bilateral    per duplex --- bilateral <50% ICA  . COPD (chronic obstructive pulmonary disease) ([place])  pulmologist-  dr [name]  . Diabetes mellitus without complication (HCC)    diet controlled  . Gait abnormality 12/05/2019  . GERD (gastroesophageal reflux disease)   . Hyperlipidemia   . Hypertension   . Hypothyroidism   . Lesion of left lung    followed by dr [name] (pulmologist)  post infection scarring  . Mild dementia ([place])   . OSA (obstructive sleep apnea)    has a cpap-  intolerant; due to [place] (01/25/2018)  . Peripheral vascular disease [facility])    cardiologist-- dr berry-- per last ABI/Aorta/Iliac 06-09-2016  patent bilateral iliac kissing stents, >50% stenosis bilateral iliac artery (R>L) and left external iliac artery, patent IVC  . Prostate cancer [facility])    urologist-  dr [name]  oncologist-  dr [name]. Dx'd 2018 s/p XRT  . RLS (restless legs syndrome)   . S/P TAVR (transcatheter aortic valve replacement) 02/2018   s/p TAVR via transapical approach with an Edwards Sapien 3 THV (size 26 mm, model # 9600TFX, serial # [id number])  . Schatzki's ring   . Severe aortic stenosis   . Wears dentures   . Wears glasses     Immunization History  Administered Date(s) Administered  . DT (Pediatric) 09/22/2013  . Influenza, High Dose Seasonal PF 04/11/2014, 04/20/2015  . Zoster 06/30/2005    Past Surgical History:  Procedure Laterality Date  . ABDOMINAL AORTOGRAM N/A 01/25/2018   Procedure: ABDOMINAL AORTOGRAM;  Surgeon: [name], MD;  Location: [place] [facility];  Service: Cardiovascular;  Laterality: N/A;  . ANAL FISTULECTOMY  2010  . BALLOON DILATION N/A 11/17/2018   Procedure: BALLOON DILATION;  Surgeon: [name], MD;  [facility];  Service: Endoscopy;  Laterality: N/A;  . BALLOON DILATION N/A 09/20/2019   Procedure: BALLOON DILATION;  Surgeon: [name], MD;  [facility];  Service: Endoscopy;  Laterality: N/A;  intubation  use fluoroscopy  . BOTOX INJECTION  08/26/2019   Procedure: BOTOX INJECTION;  Surgeon: Lavena Bullion, DO;  [facility];  Service: Gastroenterology;;  . CARDIAC CATHETERIZATION Bilateral 01/25/2018  . CARDIOVASCULAR STRESS TEST  04-23-2016   dr berry   normal nuclear study w/ no ischemia/  normal LV function and wall function, nuclear stress ef 57%  . CATARACT EXTRACTION W/ INTRAOCULAR LENS  IMPLANT, BILATERAL Bilateral   . COLONOSCOPY    . ESOPHAGEAL MANOMETRY N/A 10/12/2017   Procedure: ESOPHAGEAL MANOMETRY (EM);  Surgeon: [name], MD;  [facility];  Service: Gastroenterology;  Laterality: N/A;  . ESOPHAGOGASTRODUODENOSCOPY N/A 10/12/2017   Procedure: ESOPHAGOGASTRODUODENOSCOPY (EGD);  Surgeon: [name], MD;  Location: [facility];  Service:  Gastroenterology;  Laterality: N/A;  . ESOPHAGOGASTRODUODENOSCOPY (EGD) WITH ESOPHAGEAL DILATION  last one 11-09-2015  dr Yolanda Bonine   dilation schatzki's ring  . ESOPHAGOGASTRODUODENOSCOPY (EGD) WITH PROPOFOL N/A 11/17/2018   Procedure: ESOPHAGOGASTRODUODENOSCOPY;  Surgeon: [name], MD;  [facility];  Service: Endoscopy;  Laterality: N/A;  . ESOPHAGOGASTRODUODENOSCOPY (EGD) WITH PROPOFOL N/A 08/26/2019   Procedure: ESOPHAGOGASTRODUODENOSCOPY (EGD) WITH PROPOFOL;  Surgeon: Lavena Bullion, DO;  [facility];  Service: Gastroenterology;  Laterality: N/A;  . ESOPHAGOGASTRODUODENOSCOPY (EGD) WITH PROPOFOL N/A 09/20/2019   Procedure: ESOPHAGOGASTRODUODENOSCOPY (EGD) WITH PROPOFOL;  Surgeon: [name], MD;  [facility];  Service: Endoscopy;  Laterality: N/A;  with intubation fluoro needed  . FOREIGN BODY REMOVAL  11/17/2018   Procedure: FOREIGN BODY REMOVAL;  Surgeon: [name], MD;  [facility];  Service: Endoscopy;;  .  FOREIGN BODY REMOVAL  08/26/2019   Procedure: FOREIGN BODY REMOVAL;  Surgeon: Lavena Bullion, DO;  [facility];  Service: Gastroenterology;;  . FOREIGN BODY REMOVAL  09/20/2019   Procedure: FOREIGN BODY REMOVAL;  Surgeon: [name], MD;  [facility];  Service: Endoscopy;;  Food Impaction  . GOLD SEED IMPLANT N/A 04/29/2017   Procedure: GOLD SEED IMPLANT;  Surgeon: [name], MD;  Location: [facility];  Service: Urology;  Laterality: N/A;  . HERNIA REPAIR    . INGUINAL HERNIA REPAIR Right 02/23/2014   Procedure: HERNIA REPAIR INGUINAL ADULT;  Surgeon: Joyice Faster. Cornett, MD;  Location: [place];  Service: General;  Laterality: Right;  . INSERTION OF MESH Right 02/23/2014   Procedure: INSERTION OF MESH;  Surgeon: Joyice Faster. Cornett, MD;  Location: [place];  Service: General;  Laterality: Right;  . PERIPHERAL VASCULAR  CATHETERIZATION N/A 05/29/2016   Procedure: Lower Extremity Angiography;  Surgeon: [name], MD;  Location: [place] [facility];  Service: Cardiovascular;  Laterality: N/A; [place] orbital rotational atherectomy, PTA and stenting bilateral common iliac arteries   . PILONIDAL CYST / SINUS EXCISION  2000  . PROSTATE BIOPSY  09-22-2016  dr [name] office  . RIGHT/LEFT HEART CATH AND CORONARY ANGIOGRAPHY N/A 01/25/2018   Procedure: RIGHT/LEFT HEART CATH AND CORONARY ANGIOGRAPHY;  Surgeon: [name], MD;  Location: [place] [facility];  Service: Cardiovascular;  Laterality: N/A;  . SAVORY DILATION N/A 10/12/2017   Procedure: SAVORY DILATION;  Surgeon: [name], MD;  [facility];  Service: Gastroenterology;  Laterality: N/A;  . SAVORY DILATION N/A 11/17/2018   Procedure: SAVORY DILATION;  Surgeon: [name], MD;  [facility];  Service: Endoscopy;  Laterality: N/A;  . SPACE OAR INSTILLATION N/A 04/29/2017   Procedure: SPACE OAR INSTILLATION;  Surgeon: [name], MD;  Location[facility];  Service: Urology;  Laterality: N/A;  . TRANSCATHETER AORTIC VALVE REPLACEMENT, TRANSAPICAL Left 03/16/2018   Procedure: TRANSCATHETER AORTIC VALVE REPLACEMENT, TRANSAPICAL;  Surgeon: Sherren Mocha, MD;  Location: [place];  Service: Open Heart Surgery;  Laterality: Left;  . TRANSTHORACIC ECHOCARDIOGRAM  05-05-2016   dr berry   ef 23-76%,  grade 1diastolic dysfunction/  moderate AV stenosis with mod. AR (right coronary and noncoronary cusp mobility seversly restricted);  valve area 1.25cm^2, mean gradient 27 mmHg, peak gradient 73mmHg/  mild MR  . UPPER GASTROINTESTINAL ENDOSCOPY      FHx:    Reviewed / unchanged  SHx:    Reviewed / unchanged   Systems Review:  Constitutional: Denies fever, chills, wt changes, headaches, insomnia, fatigue, night sweats, change in appetite. Eyes: Denies redness, blurred vision, diplopia, discharge, itchy,  watery eyes.  ENT: Denies discharge, congestion, post nasal drip, epistaxis, sore throat, earache, hearing loss, dental pain, tinnitus, vertigo, sinus pain, snoring.  CV: Denies chest pain, palpitations, irregular heartbeat, syncope, dyspnea, diaphoresis, orthopnea, PND, claudication or edema. Respiratory: denies cough, dyspnea, DOE, pleurisy, hoarseness, laryngitis, wheezing.  Gastrointestinal: Denies dysphagia, odynophagia, heartburn, reflux, water brash, abdominal pain or cramps, nausea, vomiting, bloating, diarrhea, constipation, hematemesis, melena, hematochezia  or hemorrhoids. Genitourinary: Denies dysuria, frequency, urgency, nocturia, hesitancy, discharge, hematuria or flank pain. Musculoskeletal: Denies arthralgias, myalgias, stiffness, jt. swelling, pain, limping or strain/sprain.  Skin: Denies pruritus, rash, hives, warts, acne, eczema or change in skin lesion(s). Neuro: No weakness, tremor, incoordination, spasms, paresthesia or pain. Psychiatric: Denies confusion, memory loss or sensory loss. Endo: Denies change in  weight, skin or hair change.  Heme/Lymph: No excessive bleeding, bruising or enlarged lymph nodes.  Physical Exam  BP 114/62   Pulse 64   Temp (!) 97.2 F (36.2 C)   Resp 16   Ht 5\' 6"  (1.676 m)   Wt 141 lb 1.6 oz (64 kg)   BMI 22.77 kg/m   Appears  well nourished  and in no distress.  Eyes: PERRLA, EOMs, conjunctiva no swelling or erythema. Sinuses: No frontal/maxillary tenderness ENT/Mouth: EAC's clear, TM's nl w/o erythema, bulging. Nares clear w/o erythema, swelling, exudates. Oropharynx clear without erythema or exudates. Oral hygiene is good. Tongue normal, non obstructing. Hearing intact.  Neck: Supple. Thyroid not palpable. Car 2+/2+ without bruits, nodes or JVD. Chest: Respirations nl with BS clear & equal w/o rales, rhonchi, wheezing or stridor.  Cor: Heart sounds normal w/ regular rate and rhythm without sig. murmurs, gallops, clicks or rubs.  Peripheral pulses normal and equal  without edema.  Abdomen: Soft & bowel sounds normal. Non-tender w/o guarding, rebound, hernias, masses or organomegaly.  Lymphatics: Unremarkable.  Musculoskeletal: Full ROM all peripheral extremities, joint stability, 5/5 strength and normal gait.  Skin: Warm, dry without exposed rashes, lesions or ecchymosis apparent.  Neuro: Cranial nerves intact, reflexes equal bilaterally. Sensory-motor testing grossly intact. Tendon reflexes grossly intact.  Pysch: Alert & oriented x 3.  Insight and judgement nl & appropriate. No ideations.  Assessment and Plan:  1. Labile hypertension  - Continue medication, monitor blood pressure at home.  - Continue DASH diet.  Reminder to go to the ER if any CP,  SOB, nausea, dizziness, severe HA, changes vision/speech.  - CBC with Differential/Platelet - COMPLETE METABOLIC PANEL WITH GFR - Magnesium - TSH  2. Hyperlipidemia, mixed  - Continue diet/meds, exercise,& lifestyle modifications.  - Continue monitor periodic cholesterol/liver & renal functions   - Lipid panel - TSH  3. Abnormal glucose  - Continue diet, exercise  - Lifestyle modifications.  - Monitor appropriate labs.  - Hemoglobin A1c - Insulin, random  4. Vitamin D deficiency  - Continue supplementation.  - VITAMIN D 25 Hydroxyl  5. Prediabetes  - Hemoglobin A1c - Insulin, random  6. Hypothyroidism, unspecified type  - TSH  7. Paroxysmal atrial fibrillation (HCC)  - TSH  8. PVD (peripheral vascular disease) (HCC)  - Lipid panel  9. Medication management  - CBC with Differential/Platelet - COMPLETE METABOLIC PANEL WITH GFR - Magnesium - Lipid panel - TSH - Hemoglobin A1c - Insulin, random - VITAMIN D 25 Hydroxyl        Discussed  regular exercise, BP monitoring, weight control to achieve/maintain BMI less than 25 and discussed med and SE's. Recommended labs to assess and monitor clinical status with further disposition  pending results of labs.  I discussed the assessment and treatment plan with the patient. The patient was provided an opportunity to ask questions and all were answered. The patient agreed with the plan and demonstrated an understanding of the instructions.  I provided over 30 minutes of exam, counseling, chart review and  complex critical decision making.         The patient was advised to call back or seek an in-person evaluation if the symptoms worsen or if the condition fails to improve as anticipated.   Kirtland Bouchard, MD

## 2020-01-16 ENCOUNTER — Ambulatory Visit (INDEPENDENT_AMBULATORY_CARE_PROVIDER_SITE_OTHER): Payer: Medicare Other | Admitting: Internal Medicine

## 2020-01-16 ENCOUNTER — Other Ambulatory Visit: Payer: Self-pay

## 2020-01-16 VITALS — BP 114/62 | HR 64 | Temp 97.2°F | Resp 16 | Ht 66.0 in | Wt 141.1 lb

## 2020-01-16 DIAGNOSIS — R0989 Other specified symptoms and signs involving the circulatory and respiratory systems: Secondary | ICD-10-CM | POA: Diagnosis not present

## 2020-01-16 DIAGNOSIS — E782 Mixed hyperlipidemia: Secondary | ICD-10-CM

## 2020-01-16 DIAGNOSIS — E559 Vitamin D deficiency, unspecified: Secondary | ICD-10-CM | POA: Diagnosis not present

## 2020-01-16 DIAGNOSIS — Z79899 Other long term (current) drug therapy: Secondary | ICD-10-CM

## 2020-01-16 DIAGNOSIS — I739 Peripheral vascular disease, unspecified: Secondary | ICD-10-CM | POA: Diagnosis not present

## 2020-01-16 DIAGNOSIS — R7303 Prediabetes: Secondary | ICD-10-CM

## 2020-01-16 DIAGNOSIS — J4520 Mild intermittent asthma, uncomplicated: Secondary | ICD-10-CM | POA: Diagnosis not present

## 2020-01-16 DIAGNOSIS — R7309 Other abnormal glucose: Secondary | ICD-10-CM | POA: Diagnosis not present

## 2020-01-16 DIAGNOSIS — E039 Hypothyroidism, unspecified: Secondary | ICD-10-CM

## 2020-01-16 DIAGNOSIS — I480 Paroxysmal atrial fibrillation: Secondary | ICD-10-CM

## 2020-01-16 MED ORDER — ALBUTEROL SULFATE HFA 108 (90 BASE) MCG/ACT IN AERS: INHALATION_SPRAY | RESPIRATORY_TRACT | 1 refills | Status: DC

## 2020-01-17 DIAGNOSIS — I10 Essential (primary) hypertension: Secondary | ICD-10-CM | POA: Diagnosis not present

## 2020-01-17 DIAGNOSIS — F0390 Unspecified dementia without behavioral disturbance: Secondary | ICD-10-CM | POA: Diagnosis not present

## 2020-01-17 DIAGNOSIS — I480 Paroxysmal atrial fibrillation: Secondary | ICD-10-CM | POA: Diagnosis not present

## 2020-01-17 DIAGNOSIS — J449 Chronic obstructive pulmonary disease, unspecified: Secondary | ICD-10-CM | POA: Diagnosis not present

## 2020-01-17 DIAGNOSIS — R269 Unspecified abnormalities of gait and mobility: Secondary | ICD-10-CM | POA: Diagnosis not present

## 2020-01-17 DIAGNOSIS — E1151 Type 2 diabetes mellitus with diabetic peripheral angiopathy without gangrene: Secondary | ICD-10-CM | POA: Diagnosis not present

## 2020-01-17 LAB — CBC WITH DIFFERENTIAL/PLATELET
Absolute Monocytes: 426 cells/uL (ref 200–950)
Basophils Absolute: 59 cells/uL (ref 0–200)
Basophils Relative: 1.2 %
Eosinophils Absolute: 240 cells/uL (ref 15–500)
Eosinophils Relative: 4.9 %
HCT: 37.7 % — ABNORMAL LOW (ref 38.5–50.0)
Hemoglobin: 12.9 g/dL — ABNORMAL LOW (ref 13.2–17.1)
Lymphs Abs: 1000 cells/uL (ref 850–3900)
MCH: 31.5 pg (ref 27.0–33.0)
MCHC: 34.2 g/dL (ref 32.0–36.0)
MCV: 92 fL (ref 80.0–100.0)
MPV: 11.2 fL (ref 7.5–12.5)
Monocytes Relative: 8.7 %
Neutro Abs: 3175 cells/uL (ref 1500–7800)
Neutrophils Relative %: 64.8 %
Platelets: 137 10*3/uL — ABNORMAL LOW (ref 140–400)
RBC: 4.1 10*6/uL — ABNORMAL LOW (ref 4.20–5.80)
RDW: 13.4 % (ref 11.0–15.0)
Total Lymphocyte: 20.4 %
WBC: 4.9 10*3/uL (ref 3.8–10.8)

## 2020-01-17 LAB — COMPLETE METABOLIC PANEL WITH GFR
AG Ratio: 1.8 (calc) (ref 1.0–2.5)
ALT: 12 U/L (ref 9–46)
AST: 16 U/L (ref 10–35)
Albumin: 4.2 g/dL (ref 3.6–5.1)
Alkaline phosphatase (APISO): 90 U/L (ref 35–144)
BUN: 14 mg/dL (ref 7–25)
CO2: 31 mmol/L (ref 20–32)
Calcium: 10.1 mg/dL (ref 8.6–10.3)
Chloride: 104 mmol/L (ref 98–110)
Creat: 0.9 mg/dL (ref 0.70–1.18)
GFR, Est African American: 94 mL/min/{1.73_m2} (ref >=60)
GFR, Est Non African American: 81 mL/min/{1.73_m2} (ref >=60)
Globulin: 2.3 g/dL (calc) (ref 1.9–3.7)
Glucose, Bld: 92 mg/dL (ref 65–99)
Potassium: 4.7 mmol/L (ref 3.5–5.3)
Sodium: 141 mmol/L (ref 135–146)
Total Bilirubin: 0.6 mg/dL (ref 0.2–1.2)
Total Protein: 6.5 g/dL (ref 6.1–8.1)

## 2020-01-17 LAB — HEMOGLOBIN A1C
Hgb A1c MFr Bld: 5.1 % of total Hgb (ref <5.7)
Mean Plasma Glucose: 100 (calc)
eAG (mmol/L): 5.5 (calc)

## 2020-01-17 LAB — LIPID PANEL
Cholesterol: 167 mg/dL (ref <200)
HDL: 59 mg/dL (ref >=40)
LDL Cholesterol (Calc): 90 mg/dL (calc)
Non-HDL Cholesterol (Calc): 108 mg/dL (calc) (ref <130)
Total CHOL/HDL Ratio: 2.8 (calc) (ref <5.0)
Triglycerides: 90 mg/dL (ref <150)

## 2020-01-17 LAB — INSULIN, RANDOM: Insulin: 8.3 u[IU]/mL

## 2020-01-17 LAB — VITAMIN D 25 HYDROXY (VIT D DEFICIENCY, FRACTURES): Vit D, 25-Hydroxy: 46 ng/mL (ref 30–100)

## 2020-01-17 LAB — TSH: TSH: 3.63 mIU/L (ref 0.40–4.50)

## 2020-01-17 LAB — MAGNESIUM: Magnesium: 1.9 mg/dL (ref 1.5–2.5)

## 2020-01-17 NOTE — Progress Notes (Signed)
==========================================================  -    Chol = 167 -  Excellent   - Very low risk for Heart Attack  / Stroke =============================================================  - A1c - Normal - Great - No Diabetes  ==========================================================  -  Vitamin D = 46 - Lo w  (Ideal or Goal is between 70-100)  - So................[name]  recommend increase Vitamin D 5,000 up to                             2 capsules = 10,000 units every day  ==========================================================  -  All Else - CBC - Kidneys - Electrolytes - Liver - Magnesium & Thyroid    - all  Normal / OK ====================================================

## 2020-01-20 DIAGNOSIS — I10 Essential (primary) hypertension: Secondary | ICD-10-CM | POA: Diagnosis not present

## 2020-01-20 DIAGNOSIS — F0390 Unspecified dementia without behavioral disturbance: Secondary | ICD-10-CM | POA: Diagnosis not present

## 2020-01-20 DIAGNOSIS — R269 Unspecified abnormalities of gait and mobility: Secondary | ICD-10-CM | POA: Diagnosis not present

## 2020-01-20 DIAGNOSIS — I480 Paroxysmal atrial fibrillation: Secondary | ICD-10-CM | POA: Diagnosis not present

## 2020-01-20 DIAGNOSIS — J449 Chronic obstructive pulmonary disease, unspecified: Secondary | ICD-10-CM | POA: Diagnosis not present

## 2020-01-20 DIAGNOSIS — E1151 Type 2 diabetes mellitus with diabetic peripheral angiopathy without gangrene: Secondary | ICD-10-CM | POA: Diagnosis not present

## 2020-01-21 DIAGNOSIS — G4733 Obstructive sleep apnea (adult) (pediatric): Secondary | ICD-10-CM | POA: Diagnosis not present

## 2020-01-21 DIAGNOSIS — I480 Paroxysmal atrial fibrillation: Secondary | ICD-10-CM | POA: Diagnosis not present

## 2020-01-21 DIAGNOSIS — Z952 Presence of prosthetic heart valve: Secondary | ICD-10-CM | POA: Diagnosis not present

## 2020-01-21 DIAGNOSIS — J449 Chronic obstructive pulmonary disease, unspecified: Secondary | ICD-10-CM | POA: Diagnosis not present

## 2020-01-21 DIAGNOSIS — E1151 Type 2 diabetes mellitus with diabetic peripheral angiopathy without gangrene: Secondary | ICD-10-CM | POA: Diagnosis not present

## 2020-01-21 DIAGNOSIS — I6523 Occlusion and stenosis of bilateral carotid arteries: Secondary | ICD-10-CM | POA: Diagnosis not present

## 2020-01-21 DIAGNOSIS — Z87891 Personal history of nicotine dependence: Secondary | ICD-10-CM | POA: Diagnosis not present

## 2020-01-21 DIAGNOSIS — I6789 Other cerebrovascular disease: Secondary | ICD-10-CM | POA: Diagnosis not present

## 2020-01-21 DIAGNOSIS — I10 Essential (primary) hypertension: Secondary | ICD-10-CM | POA: Diagnosis not present

## 2020-01-21 DIAGNOSIS — F0390 Unspecified dementia without behavioral disturbance: Secondary | ICD-10-CM | POA: Diagnosis not present

## 2020-01-21 DIAGNOSIS — Z9181 History of falling: Secondary | ICD-10-CM | POA: Diagnosis not present

## 2020-01-21 DIAGNOSIS — R269 Unspecified abnormalities of gait and mobility: Secondary | ICD-10-CM | POA: Diagnosis not present

## 2020-01-24 DIAGNOSIS — E1151 Type 2 diabetes mellitus with diabetic peripheral angiopathy without gangrene: Secondary | ICD-10-CM | POA: Diagnosis not present

## 2020-01-24 DIAGNOSIS — I10 Essential (primary) hypertension: Secondary | ICD-10-CM | POA: Diagnosis not present

## 2020-01-24 DIAGNOSIS — F0390 Unspecified dementia without behavioral disturbance: Secondary | ICD-10-CM | POA: Diagnosis not present

## 2020-01-24 DIAGNOSIS — R269 Unspecified abnormalities of gait and mobility: Secondary | ICD-10-CM | POA: Diagnosis not present

## 2020-01-24 DIAGNOSIS — J449 Chronic obstructive pulmonary disease, unspecified: Secondary | ICD-10-CM | POA: Diagnosis not present

## 2020-01-24 DIAGNOSIS — I480 Paroxysmal atrial fibrillation: Secondary | ICD-10-CM | POA: Diagnosis not present

## 2020-01-26 DIAGNOSIS — E1151 Type 2 diabetes mellitus with diabetic peripheral angiopathy without gangrene: Secondary | ICD-10-CM | POA: Diagnosis not present

## 2020-01-26 DIAGNOSIS — R269 Unspecified abnormalities of gait and mobility: Secondary | ICD-10-CM | POA: Diagnosis not present

## 2020-01-26 DIAGNOSIS — J449 Chronic obstructive pulmonary disease, unspecified: Secondary | ICD-10-CM | POA: Diagnosis not present

## 2020-01-26 DIAGNOSIS — F0390 Unspecified dementia without behavioral disturbance: Secondary | ICD-10-CM | POA: Diagnosis not present

## 2020-01-26 DIAGNOSIS — I480 Paroxysmal atrial fibrillation: Secondary | ICD-10-CM | POA: Diagnosis not present

## 2020-01-26 DIAGNOSIS — I10 Essential (primary) hypertension: Secondary | ICD-10-CM | POA: Diagnosis not present

## 2020-01-31 DIAGNOSIS — I10 Essential (primary) hypertension: Secondary | ICD-10-CM | POA: Diagnosis not present

## 2020-01-31 DIAGNOSIS — E1151 Type 2 diabetes mellitus with diabetic peripheral angiopathy without gangrene: Secondary | ICD-10-CM | POA: Diagnosis not present

## 2020-01-31 DIAGNOSIS — F0390 Unspecified dementia without behavioral disturbance: Secondary | ICD-10-CM | POA: Diagnosis not present

## 2020-01-31 DIAGNOSIS — J449 Chronic obstructive pulmonary disease, unspecified: Secondary | ICD-10-CM | POA: Diagnosis not present

## 2020-01-31 DIAGNOSIS — R269 Unspecified abnormalities of gait and mobility: Secondary | ICD-10-CM | POA: Diagnosis not present

## 2020-01-31 DIAGNOSIS — I480 Paroxysmal atrial fibrillation: Secondary | ICD-10-CM | POA: Diagnosis not present

## 2020-02-06 NOTE — Progress Notes (Signed)
Assessment and Plan:  Robert Schaefer was seen today for acute visit and fall.  Diagnoses and all orders for this visit:  Labile hypertension Continue current medications: Monitor blood pressure at home; call if consistently over 130/80 Continue DASH diet.   Reminder to go to the ER if any CP, SOB, nausea, dizziness, severe HA, changes vision/speech, left arm numbness and tingling and jaw pain.  Fall, initial encounter -     DG HIP UNILAT W OR W/O PELVIS 2-3 VIEWS LEFT; Future Continue PT at this time Discussed safety and decreasing fall risks Follows with Neurology, Dr Jannifer Franklin  Hyperlipidemia, mixed Continue medications: Discussed dietary and exercise modifications Low fat diet  Abnormal glucose Discussed dietary and exercise modifications  Hypothyroidism, unspecified type Taking levothyroxine 50 mcg daily Reminder to take on an empty stomach 30-73mins before first meal of the day. No antacid medications for 4 hours.  Vitamin D deficiency Continue supplementation to maintain goal of 70-100 Taking Vitamin D 5,000 IU daily Defer vitamin D level  Paroxysmal atrial fibrillation (HCC) History of, no recurrence No anticoagulation  PVD (peripheral vascular disease) ([place]) History of PAD, calcified left iliac and occluded right s/p obital atherectomy, PTA covering stent 05/29/2016. Dopplers 06/20/19 WNL       Further disposition pending results of labs. Discussed med's effects and SE's.   Over 30 minutes of exam, counseling, chart review, and critical decision making was performed.   Future Appointments  Date Time Provider [place]  03/14/2020  3:45 PM MC-CV [facility] ECHO 3 [id number]  04/18/2020  3:30 PM Vicie Mutters, PA-C GAAM-GAAIM None  06/06/2020  3:45 PM Suzzanne Cloud, NP GNA-GNA None  06/07/2020  9:00 AM MC-CV NL VASC 2 MC-SECVI [facility]  06/07/2020 10:00 AM MC-CV NL VASC 2 MC-SECVI [facility]  06/13/2020 11:30 AM [name], MD CVD-NORTHLIN [facility]   07/30/2020  2:00 PM [name], MD GAAM-GAAIM None    ------------------------------------------------------------------------------------------------------------------   HPI 79 y.o.male presents for evaluation s/p fall.  He has had multiple falls 4 or 5 over the course of the last 4 weeks.  Most recent fall was two weeks ago.  It was unwitnessed but he reports he fell off of the couch.  He is accompanied by his wife who lives in the home and his daughter.  Her denies pain when asked but family reports that he complains about his left hip and will rub his left leg at times.  He does have dementia, w/o behavioral disturbance.  He is currently participating in PT in the home three days a week.  He has had ongoing gait abnormality and is follow with neurology, Dr Jannifer Franklin, for this.  He is able to ambulate and walked into office/exam room today without difficulty though e does shuffle his feet at times during ambulation.  He denies any numbness or tingling to his left leg or pain with ambulation.   Past Medical History:  Diagnosis Date   AAA (abdominal aortic aneurysm) (HCC)    no longer monitoring was small area   Achalasia    Carotid stenosis, asymptomatic, bilateral    per duplex --- bilateral <50% ICA   COPD (chronic obstructive pulmonary disease) ([place])    pulmologist-  dr [name]   Diabetes mellitus without complication ([place])    diet controlled   Gait abnormality 12/05/2019   GERD (gastroesophageal reflux disease)    Hyperlipidemia    Hypertension    Hypothyroidism    Lesion of left lung    followed by dr  alva (pulmologist)  post infection scarring   Mild dementia (HCC)    OSA (obstructive sleep apnea)    has a cpap-  intolerant; due to [place] (01/25/2018)   Peripheral vascular disease [facility])    cardiologist-- dr berry-- per last ABI/Aorta/Iliac 06-09-2016  patent bilateral iliac kissing stents, >50% stenosis bilateral iliac artery (R>L) and left external iliac  artery, patent IVC   Prostate cancer [facility])    urologist-  dr [name] oncologist-  dr [name]. Dx'd 2018 s/p XRT   RLS (restless legs syndrome)    S/P TAVR (transcatheter aortic valve replacement) 02/2018   s/p TAVR via transapical approach with an Edwards Sapien 3 THV (size 26 mm, model # 9600TFX, serial # [id number])   Schatzki's ring    Severe aortic stenosis    Wears dentures    Wears glasses      Allergies  Allergen Reactions   Penicillins Anaphylaxis, Itching, Swelling and Other (See Comments)   Sulfa Antibiotics Anaphylaxis and Swelling   Versed [Midazolam] Other (See Comments)    Confusion, delerium    Current Outpatient Medications on File Prior to Visit  Medication Sig   albuterol (VENTOLIN HFA) 108 (90 Base) MCG/ACT inhaler Use 2 inhalations  15 minutes apart   Every 4 hours  as needed to rescue Wheezing   aspirin EC 81 MG tablet Take 81 mg by mouth daily.   Cholecalciferol (VITAMIN D-3) 5000 units TABS Take 5,000 Units by mouth daily at 3 pm.   feeding supplement, ENSURE ENLIVE, (ENSURE ENLIVE) LIQD Take 237 mLs by mouth 5 (five) times daily.    fluticasone (FLONASE) 50 MCG/ACT nasal spray Place 2 sprays into both nostrils daily as needed for allergies or rhinitis.    levothyroxine (SYNTHROID) 50 MCG tablet Take 1 tablet daily on an empty stomach with only water for 30 minutes & no Antacid meds, Calcium or Magnesium for 4 hours & avoid Biotin   pantoprazole (PROTONIX) 40 MG tablet Take 1 tablet (40 mg total) by mouth 2 (two) times daily before a meal.   No current facility-administered medications on file prior to visit.    ROS: all negative except above.   Physical Exam:  BP 128/64    Pulse 66    Temp 97.6 F (36.4 C)    Wt 139 lb (63 kg)    SpO2 96%    BMI 22.44 kg/m   General Appearance: Well nourished, in no apparent distress. Eyes: PERRLA, EOMs, conjunctiva no swelling or erythema Sinuses: No Frontal/maxillary tenderness ENT/Mouth: Ext aud  canals clear, TMs without erythema, bulging. No erythema, swelling, or exudate on post pharynx.  Tonsils not swollen or erythematous. Hearing normal.  Neck: Supple, thyroid normal.  Respiratory: Respiratory effort normal, BS equal bilaterally without rales, rhonchi, wheezing or stridor.  Cardio: RRR with no MRGs. Brisk peripheral pulses without edema.  Abdomen: Soft, + BS.  Non tender, no guarding, rebound, hernias, masses. Lymphatics: Non tender without lymphadenopathy.  Musculoskeletal: Full ROM, 5/5 strength, shuffling gait.  Point tenderness of trochanter, discomfort with adduction and abduction.  Skin: Warm, dry without rashes, lesions, ecchymosis.  Neuro: Cranial nerves intact. Normal muscle tone, no cerebellar symptoms. Sensation intact.  Psych: Awake and oriented X 3, normal affect, Insight and Judgment appropriate.     Garnet Sierras, NP 4:30 PM [facility]

## 2020-02-07 ENCOUNTER — Other Ambulatory Visit: Payer: Self-pay

## 2020-02-07 ENCOUNTER — Ambulatory Visit (INDEPENDENT_AMBULATORY_CARE_PROVIDER_SITE_OTHER): Payer: Medicare Other | Admitting: Adult Health Nurse Practitioner

## 2020-02-07 ENCOUNTER — Telehealth: Payer: Self-pay | Admitting: Neurology

## 2020-02-07 ENCOUNTER — Encounter: Payer: Self-pay | Admitting: Adult Health Nurse Practitioner

## 2020-02-07 VITALS — BP 128/64 | HR 66 | Temp 97.6°F | Wt 139.0 lb

## 2020-02-07 DIAGNOSIS — E559 Vitamin D deficiency, unspecified: Secondary | ICD-10-CM

## 2020-02-07 DIAGNOSIS — R7309 Other abnormal glucose: Secondary | ICD-10-CM | POA: Diagnosis not present

## 2020-02-07 DIAGNOSIS — I480 Paroxysmal atrial fibrillation: Secondary | ICD-10-CM

## 2020-02-07 DIAGNOSIS — E039 Hypothyroidism, unspecified: Secondary | ICD-10-CM

## 2020-02-07 DIAGNOSIS — R269 Unspecified abnormalities of gait and mobility: Secondary | ICD-10-CM | POA: Diagnosis not present

## 2020-02-07 DIAGNOSIS — J449 Chronic obstructive pulmonary disease, unspecified: Secondary | ICD-10-CM | POA: Diagnosis not present

## 2020-02-07 DIAGNOSIS — E1151 Type 2 diabetes mellitus with diabetic peripheral angiopathy without gangrene: Secondary | ICD-10-CM | POA: Diagnosis not present

## 2020-02-07 DIAGNOSIS — W19XXXA Unspecified fall, initial encounter: Secondary | ICD-10-CM

## 2020-02-07 DIAGNOSIS — E782 Mixed hyperlipidemia: Secondary | ICD-10-CM

## 2020-02-07 DIAGNOSIS — I739 Peripheral vascular disease, unspecified: Secondary | ICD-10-CM

## 2020-02-07 DIAGNOSIS — I10 Essential (primary) hypertension: Secondary | ICD-10-CM | POA: Diagnosis not present

## 2020-02-07 DIAGNOSIS — R0989 Other specified symptoms and signs involving the circulatory and respiratory systems: Secondary | ICD-10-CM | POA: Diagnosis not present

## 2020-02-07 DIAGNOSIS — F0390 Unspecified dementia without behavioral disturbance: Secondary | ICD-10-CM | POA: Diagnosis not present

## 2020-02-07 NOTE — Telephone Encounter (Signed)
Dr Felecia Shelling- are you ok with me providing this VO? This is a Dr. Jannifer Franklin patient

## 2020-02-07 NOTE — Telephone Encounter (Signed)
Ok for verbal order  °

## 2020-02-07 NOTE — Telephone Encounter (Signed)
Called, LVM providing VO as requested below. Advised Dr. Felecia Shelling is providing d/t Dr. Jannifer Franklin being out of the office. Advised them to call back if anything further is needed. Gave office number.

## 2020-02-07 NOTE — Telephone Encounter (Signed)
[facility] called to report a fall.  She stated pt slid from his recliner on this past Saturday, his Son In [place] picked him up.  Pt declined going to the hospital.  Pt is scheduled to see his PCP today.  Almyra is asking for a verbal to hold off on discharging pt.  She is planning on a recert for Home Health PT.  Please call

## 2020-02-09 ENCOUNTER — Ambulatory Visit
Admission: RE | Admit: 2020-02-09 | Discharge: 2020-02-09 | Disposition: A | Discharge status: Home / Self Care | Payer: Medicare Other | Source: Ambulatory Visit | Attending: Adult Health Nurse Practitioner | Admitting: Adult Health Nurse Practitioner

## 2020-02-09 ENCOUNTER — Other Ambulatory Visit: Payer: Self-pay

## 2020-02-09 DIAGNOSIS — I7090 Unspecified atherosclerosis: Secondary | ICD-10-CM | POA: Diagnosis not present

## 2020-02-09 DIAGNOSIS — W19XXXA Unspecified fall, initial encounter: Secondary | ICD-10-CM

## 2020-02-09 DIAGNOSIS — M25552 Pain in left hip: Secondary | ICD-10-CM | POA: Diagnosis not present

## 2020-02-09 DIAGNOSIS — R296 Repeated falls: Secondary | ICD-10-CM | POA: Diagnosis not present

## 2020-02-15 DIAGNOSIS — F0390 Unspecified dementia without behavioral disturbance: Secondary | ICD-10-CM | POA: Diagnosis not present

## 2020-02-15 DIAGNOSIS — I480 Paroxysmal atrial fibrillation: Secondary | ICD-10-CM | POA: Diagnosis not present

## 2020-02-15 DIAGNOSIS — J449 Chronic obstructive pulmonary disease, unspecified: Secondary | ICD-10-CM | POA: Diagnosis not present

## 2020-02-15 DIAGNOSIS — I10 Essential (primary) hypertension: Secondary | ICD-10-CM | POA: Diagnosis not present

## 2020-02-15 DIAGNOSIS — R269 Unspecified abnormalities of gait and mobility: Secondary | ICD-10-CM | POA: Diagnosis not present

## 2020-02-15 DIAGNOSIS — E1151 Type 2 diabetes mellitus with diabetic peripheral angiopathy without gangrene: Secondary | ICD-10-CM | POA: Diagnosis not present

## 2020-02-16 ENCOUNTER — Other Ambulatory Visit: Payer: Self-pay | Admitting: Gastroenterology

## 2020-02-20 DIAGNOSIS — I6523 Occlusion and stenosis of bilateral carotid arteries: Secondary | ICD-10-CM | POA: Diagnosis not present

## 2020-02-20 DIAGNOSIS — G4733 Obstructive sleep apnea (adult) (pediatric): Secondary | ICD-10-CM | POA: Diagnosis not present

## 2020-02-20 DIAGNOSIS — F0390 Unspecified dementia without behavioral disturbance: Secondary | ICD-10-CM | POA: Diagnosis not present

## 2020-02-20 DIAGNOSIS — I480 Paroxysmal atrial fibrillation: Secondary | ICD-10-CM | POA: Diagnosis not present

## 2020-02-20 DIAGNOSIS — J449 Chronic obstructive pulmonary disease, unspecified: Secondary | ICD-10-CM | POA: Diagnosis not present

## 2020-02-20 DIAGNOSIS — I10 Essential (primary) hypertension: Secondary | ICD-10-CM | POA: Diagnosis not present

## 2020-02-20 DIAGNOSIS — Z9181 History of falling: Secondary | ICD-10-CM | POA: Diagnosis not present

## 2020-02-20 DIAGNOSIS — Z952 Presence of prosthetic heart valve: Secondary | ICD-10-CM | POA: Diagnosis not present

## 2020-02-20 DIAGNOSIS — R269 Unspecified abnormalities of gait and mobility: Secondary | ICD-10-CM | POA: Diagnosis not present

## 2020-02-20 DIAGNOSIS — Z87891 Personal history of nicotine dependence: Secondary | ICD-10-CM | POA: Diagnosis not present

## 2020-02-20 DIAGNOSIS — E1151 Type 2 diabetes mellitus with diabetic peripheral angiopathy without gangrene: Secondary | ICD-10-CM | POA: Diagnosis not present

## 2020-02-20 DIAGNOSIS — I6789 Other cerebrovascular disease: Secondary | ICD-10-CM | POA: Diagnosis not present

## 2020-02-22 DIAGNOSIS — E1151 Type 2 diabetes mellitus with diabetic peripheral angiopathy without gangrene: Secondary | ICD-10-CM | POA: Diagnosis not present

## 2020-02-22 DIAGNOSIS — J449 Chronic obstructive pulmonary disease, unspecified: Secondary | ICD-10-CM | POA: Diagnosis not present

## 2020-02-22 DIAGNOSIS — R269 Unspecified abnormalities of gait and mobility: Secondary | ICD-10-CM | POA: Diagnosis not present

## 2020-02-22 DIAGNOSIS — I10 Essential (primary) hypertension: Secondary | ICD-10-CM | POA: Diagnosis not present

## 2020-02-22 DIAGNOSIS — I480 Paroxysmal atrial fibrillation: Secondary | ICD-10-CM | POA: Diagnosis not present

## 2020-02-22 DIAGNOSIS — F0390 Unspecified dementia without behavioral disturbance: Secondary | ICD-10-CM | POA: Diagnosis not present

## 2020-02-27 ENCOUNTER — Telehealth: Payer: Self-pay

## 2020-02-27 DIAGNOSIS — J449 Chronic obstructive pulmonary disease, unspecified: Secondary | ICD-10-CM | POA: Diagnosis not present

## 2020-02-27 DIAGNOSIS — I480 Paroxysmal atrial fibrillation: Secondary | ICD-10-CM | POA: Diagnosis not present

## 2020-02-27 DIAGNOSIS — F0390 Unspecified dementia without behavioral disturbance: Secondary | ICD-10-CM | POA: Diagnosis not present

## 2020-02-27 DIAGNOSIS — E1151 Type 2 diabetes mellitus with diabetic peripheral angiopathy without gangrene: Secondary | ICD-10-CM | POA: Diagnosis not present

## 2020-02-27 DIAGNOSIS — I10 Essential (primary) hypertension: Secondary | ICD-10-CM | POA: Diagnosis not present

## 2020-02-27 DIAGNOSIS — R269 Unspecified abnormalities of gait and mobility: Secondary | ICD-10-CM | POA: Diagnosis not present

## 2020-02-27 NOTE — Telephone Encounter (Signed)
[facility] plan of care paper work signed by Dr Jannifer Franklin and faxed out

## 2020-02-29 DIAGNOSIS — E1151 Type 2 diabetes mellitus with diabetic peripheral angiopathy without gangrene: Secondary | ICD-10-CM | POA: Diagnosis not present

## 2020-02-29 DIAGNOSIS — I480 Paroxysmal atrial fibrillation: Secondary | ICD-10-CM | POA: Diagnosis not present

## 2020-02-29 DIAGNOSIS — J449 Chronic obstructive pulmonary disease, unspecified: Secondary | ICD-10-CM | POA: Diagnosis not present

## 2020-02-29 DIAGNOSIS — R269 Unspecified abnormalities of gait and mobility: Secondary | ICD-10-CM | POA: Diagnosis not present

## 2020-02-29 DIAGNOSIS — F0390 Unspecified dementia without behavioral disturbance: Secondary | ICD-10-CM | POA: Diagnosis not present

## 2020-02-29 DIAGNOSIS — I10 Essential (primary) hypertension: Secondary | ICD-10-CM | POA: Diagnosis not present

## 2020-03-05 ENCOUNTER — Other Ambulatory Visit: Payer: Self-pay | Admitting: Internal Medicine

## 2020-03-05 DIAGNOSIS — J4520 Mild intermittent asthma, uncomplicated: Secondary | ICD-10-CM

## 2020-03-07 ENCOUNTER — Other Ambulatory Visit: Payer: Self-pay | Admitting: Gastroenterology

## 2020-03-08 DIAGNOSIS — J449 Chronic obstructive pulmonary disease, unspecified: Secondary | ICD-10-CM | POA: Diagnosis not present

## 2020-03-08 DIAGNOSIS — F0390 Unspecified dementia without behavioral disturbance: Secondary | ICD-10-CM | POA: Diagnosis not present

## 2020-03-08 DIAGNOSIS — I480 Paroxysmal atrial fibrillation: Secondary | ICD-10-CM | POA: Diagnosis not present

## 2020-03-08 DIAGNOSIS — E1151 Type 2 diabetes mellitus with diabetic peripheral angiopathy without gangrene: Secondary | ICD-10-CM | POA: Diagnosis not present

## 2020-03-08 DIAGNOSIS — I10 Essential (primary) hypertension: Secondary | ICD-10-CM | POA: Diagnosis not present

## 2020-03-08 DIAGNOSIS — R269 Unspecified abnormalities of gait and mobility: Secondary | ICD-10-CM | POA: Diagnosis not present

## 2020-03-12 DIAGNOSIS — E1151 Type 2 diabetes mellitus with diabetic peripheral angiopathy without gangrene: Secondary | ICD-10-CM | POA: Diagnosis not present

## 2020-03-12 DIAGNOSIS — I10 Essential (primary) hypertension: Secondary | ICD-10-CM | POA: Diagnosis not present

## 2020-03-12 DIAGNOSIS — J449 Chronic obstructive pulmonary disease, unspecified: Secondary | ICD-10-CM | POA: Diagnosis not present

## 2020-03-12 DIAGNOSIS — F0390 Unspecified dementia without behavioral disturbance: Secondary | ICD-10-CM | POA: Diagnosis not present

## 2020-03-12 DIAGNOSIS — R269 Unspecified abnormalities of gait and mobility: Secondary | ICD-10-CM | POA: Diagnosis not present

## 2020-03-12 DIAGNOSIS — I480 Paroxysmal atrial fibrillation: Secondary | ICD-10-CM | POA: Diagnosis not present

## 2020-03-14 ENCOUNTER — Other Ambulatory Visit (HOSPITAL_COMMUNITY): Payer: Medicare Other

## 2020-03-15 NOTE — Progress Notes (Signed)
Orders have been faxed to [facility] [place] to [phone]

## 2020-03-21 DIAGNOSIS — R269 Unspecified abnormalities of gait and mobility: Secondary | ICD-10-CM | POA: Diagnosis not present

## 2020-03-21 DIAGNOSIS — F0390 Unspecified dementia without behavioral disturbance: Secondary | ICD-10-CM | POA: Diagnosis not present

## 2020-03-21 DIAGNOSIS — Z952 Presence of prosthetic heart valve: Secondary | ICD-10-CM | POA: Diagnosis not present

## 2020-03-21 DIAGNOSIS — G4733 Obstructive sleep apnea (adult) (pediatric): Secondary | ICD-10-CM | POA: Diagnosis not present

## 2020-03-21 DIAGNOSIS — Z9181 History of falling: Secondary | ICD-10-CM | POA: Diagnosis not present

## 2020-03-21 DIAGNOSIS — I480 Paroxysmal atrial fibrillation: Secondary | ICD-10-CM | POA: Diagnosis not present

## 2020-03-21 DIAGNOSIS — I10 Essential (primary) hypertension: Secondary | ICD-10-CM | POA: Diagnosis not present

## 2020-03-21 DIAGNOSIS — E1151 Type 2 diabetes mellitus with diabetic peripheral angiopathy without gangrene: Secondary | ICD-10-CM | POA: Diagnosis not present

## 2020-03-21 DIAGNOSIS — J449 Chronic obstructive pulmonary disease, unspecified: Secondary | ICD-10-CM | POA: Diagnosis not present

## 2020-03-21 DIAGNOSIS — I6789 Other cerebrovascular disease: Secondary | ICD-10-CM | POA: Diagnosis not present

## 2020-03-21 DIAGNOSIS — Z87891 Personal history of nicotine dependence: Secondary | ICD-10-CM | POA: Diagnosis not present

## 2020-03-21 DIAGNOSIS — I6523 Occlusion and stenosis of bilateral carotid arteries: Secondary | ICD-10-CM | POA: Diagnosis not present

## 2020-03-23 DIAGNOSIS — I10 Essential (primary) hypertension: Secondary | ICD-10-CM | POA: Diagnosis not present

## 2020-03-23 DIAGNOSIS — J449 Chronic obstructive pulmonary disease, unspecified: Secondary | ICD-10-CM | POA: Diagnosis not present

## 2020-03-23 DIAGNOSIS — F0390 Unspecified dementia without behavioral disturbance: Secondary | ICD-10-CM | POA: Diagnosis not present

## 2020-03-23 DIAGNOSIS — E1151 Type 2 diabetes mellitus with diabetic peripheral angiopathy without gangrene: Secondary | ICD-10-CM | POA: Diagnosis not present

## 2020-03-23 DIAGNOSIS — R269 Unspecified abnormalities of gait and mobility: Secondary | ICD-10-CM | POA: Diagnosis not present

## 2020-03-23 DIAGNOSIS — I480 Paroxysmal atrial fibrillation: Secondary | ICD-10-CM | POA: Diagnosis not present

## 2020-03-27 DIAGNOSIS — F0390 Unspecified dementia without behavioral disturbance: Secondary | ICD-10-CM | POA: Diagnosis not present

## 2020-03-27 DIAGNOSIS — I10 Essential (primary) hypertension: Secondary | ICD-10-CM | POA: Diagnosis not present

## 2020-03-27 DIAGNOSIS — E1151 Type 2 diabetes mellitus with diabetic peripheral angiopathy without gangrene: Secondary | ICD-10-CM | POA: Diagnosis not present

## 2020-03-27 DIAGNOSIS — I480 Paroxysmal atrial fibrillation: Secondary | ICD-10-CM | POA: Diagnosis not present

## 2020-03-27 DIAGNOSIS — R269 Unspecified abnormalities of gait and mobility: Secondary | ICD-10-CM | POA: Diagnosis not present

## 2020-03-27 DIAGNOSIS — J449 Chronic obstructive pulmonary disease, unspecified: Secondary | ICD-10-CM | POA: Diagnosis not present

## 2020-04-03 DIAGNOSIS — I480 Paroxysmal atrial fibrillation: Secondary | ICD-10-CM | POA: Diagnosis not present

## 2020-04-03 DIAGNOSIS — R269 Unspecified abnormalities of gait and mobility: Secondary | ICD-10-CM | POA: Diagnosis not present

## 2020-04-03 DIAGNOSIS — F0390 Unspecified dementia without behavioral disturbance: Secondary | ICD-10-CM | POA: Diagnosis not present

## 2020-04-03 DIAGNOSIS — I10 Essential (primary) hypertension: Secondary | ICD-10-CM | POA: Diagnosis not present

## 2020-04-03 DIAGNOSIS — J449 Chronic obstructive pulmonary disease, unspecified: Secondary | ICD-10-CM | POA: Diagnosis not present

## 2020-04-03 DIAGNOSIS — E1151 Type 2 diabetes mellitus with diabetic peripheral angiopathy without gangrene: Secondary | ICD-10-CM | POA: Diagnosis not present

## 2020-04-11 DIAGNOSIS — R269 Unspecified abnormalities of gait and mobility: Secondary | ICD-10-CM | POA: Diagnosis not present

## 2020-04-11 DIAGNOSIS — J449 Chronic obstructive pulmonary disease, unspecified: Secondary | ICD-10-CM | POA: Diagnosis not present

## 2020-04-11 DIAGNOSIS — E1151 Type 2 diabetes mellitus with diabetic peripheral angiopathy without gangrene: Secondary | ICD-10-CM | POA: Diagnosis not present

## 2020-04-11 DIAGNOSIS — F0390 Unspecified dementia without behavioral disturbance: Secondary | ICD-10-CM | POA: Diagnosis not present

## 2020-04-11 DIAGNOSIS — I480 Paroxysmal atrial fibrillation: Secondary | ICD-10-CM | POA: Diagnosis not present

## 2020-04-11 DIAGNOSIS — I10 Essential (primary) hypertension: Secondary | ICD-10-CM | POA: Diagnosis not present

## 2020-04-13 ENCOUNTER — Other Ambulatory Visit: Payer: Self-pay

## 2020-04-13 ENCOUNTER — Ambulatory Visit (HOSPITAL_COMMUNITY): Payer: Medicare Other | Attending: Cardiology

## 2020-04-13 DIAGNOSIS — Z952 Presence of prosthetic heart valve: Secondary | ICD-10-CM

## 2020-04-13 LAB — ECHOCARDIOGRAM COMPLETE
AR max vel: 1.42 cm2
AV Area VTI: 1.43 cm2
AV Area mean vel: 1.37 cm2
AV Mean grad: 9.5 mmHg
AV Peak grad: 19.4 mmHg
Ao pk vel: 2.2 m/s
Area-P 1/2: 2.37 cm2
S' Lateral: 2.5 cm

## 2020-04-18 ENCOUNTER — Ambulatory Visit: Payer: Medicare Other | Admitting: Adult Health Nurse Practitioner

## 2020-04-19 ENCOUNTER — Other Ambulatory Visit: Payer: Self-pay

## 2020-04-19 ENCOUNTER — Other Ambulatory Visit: Payer: Self-pay | Admitting: Gastroenterology

## 2020-04-19 DIAGNOSIS — Z952 Presence of prosthetic heart valve: Secondary | ICD-10-CM

## 2020-04-19 NOTE — Progress Notes (Signed)
[place]

## 2020-05-02 ENCOUNTER — Encounter: Payer: Self-pay | Admitting: Adult Health Nurse Practitioner

## 2020-05-02 ENCOUNTER — Ambulatory Visit (INDEPENDENT_AMBULATORY_CARE_PROVIDER_SITE_OTHER): Payer: Medicare Other | Admitting: Adult Health Nurse Practitioner

## 2020-05-02 ENCOUNTER — Other Ambulatory Visit: Payer: Self-pay

## 2020-05-02 DIAGNOSIS — E782 Mixed hyperlipidemia: Secondary | ICD-10-CM | POA: Diagnosis not present

## 2020-05-02 DIAGNOSIS — I739 Peripheral vascular disease, unspecified: Secondary | ICD-10-CM | POA: Diagnosis not present

## 2020-05-02 DIAGNOSIS — G4733 Obstructive sleep apnea (adult) (pediatric): Secondary | ICD-10-CM | POA: Diagnosis not present

## 2020-05-02 DIAGNOSIS — E43 Unspecified severe protein-calorie malnutrition: Secondary | ICD-10-CM

## 2020-05-02 DIAGNOSIS — J984 Other disorders of lung: Secondary | ICD-10-CM

## 2020-05-02 DIAGNOSIS — J449 Chronic obstructive pulmonary disease, unspecified: Secondary | ICD-10-CM

## 2020-05-02 DIAGNOSIS — E559 Vitamin D deficiency, unspecified: Secondary | ICD-10-CM

## 2020-05-02 DIAGNOSIS — N401 Enlarged prostate with lower urinary tract symptoms: Secondary | ICD-10-CM

## 2020-05-02 DIAGNOSIS — F0390 Unspecified dementia without behavioral disturbance: Secondary | ICD-10-CM

## 2020-05-02 DIAGNOSIS — Z79899 Other long term (current) drug therapy: Secondary | ICD-10-CM

## 2020-05-02 DIAGNOSIS — I700 Atherosclerosis of aorta: Secondary | ICD-10-CM | POA: Diagnosis not present

## 2020-05-02 DIAGNOSIS — E039 Hypothyroidism, unspecified: Secondary | ICD-10-CM | POA: Diagnosis not present

## 2020-05-02 DIAGNOSIS — R0989 Other specified symptoms and signs involving the circulatory and respiratory systems: Secondary | ICD-10-CM | POA: Diagnosis not present

## 2020-05-02 DIAGNOSIS — F0391 Unspecified dementia with behavioral disturbance: Secondary | ICD-10-CM

## 2020-05-02 DIAGNOSIS — N138 Other obstructive and reflux uropathy: Secondary | ICD-10-CM

## 2020-05-02 DIAGNOSIS — I480 Paroxysmal atrial fibrillation: Secondary | ICD-10-CM

## 2020-05-02 MED ORDER — QUETIAPINE FUMARATE 25 MG PO TABS: 25.0000 mg | ORAL_TABLET | Freq: Every day | ORAL | 0 refills | Status: AC

## 2020-05-02 MED ORDER — QUETIAPINE FUMARATE 25 MG PO TABS: 25.0000 mg | ORAL_TABLET | Freq: Every day | ORAL | 0 refills | Status: DC

## 2020-05-02 NOTE — Patient Instructions (Addendum)
We will call you with the lab results.  We will also post comments on MyChart for your daughter to see.   Take seroquel 25mg  at bedtime.  This will help with mood stabilization and hallucinations.  Follow up in two weeks for follow up on the medication.     (Seroquel) Quetiapine tablets What is this medicine? QUETIAPINE (kwe TYE a peen) is an antipsychotic. It is used to treat schizophrenia and bipolar disorder, also known as manic-depression. This medicine may be used for other purposes; ask your health care provider or pharmacist if you have questions. COMMON BRAND NAME(S): Seroquel What should I tell my health care provider before I take this medicine? They need to know if you have any of these conditions:  blockage in your bowel  cataracts  constipation  dehydration  diabetes  difficulty swallowing  glaucoma  heart disease  history of breast cancer  kidney disease  liver disease  low blood counts, like low white cell, platelet, or red cell counts  low blood pressure or dizziness when standing up  Parkinson's disease  previous heart attack  prostate disease  seizures  stomach or intestine problems  suicidal thoughts, plans or attempt; a previous suicide attempt by you or a family member  thyroid disease  trouble passing urine  an unusual or allergic reaction to quetiapine, other medicines, foods, dyes, or preservatives  pregnant or trying to get pregnant  breast-feeding How should I use this medicine? Take this medicine by mouth. Swallow it with a drink of water. Follow the directions on the prescription label. If it upsets your stomach you can take it with food. Take your medicine at regular intervals. Do not take it more often than directed. Do not stop taking except on the advice of your doctor or health care professional. A special MedGuide will be given to you by the pharmacist with each prescription and refill. Be sure to read this information  carefully each time. Talk to your pediatrician regarding the use of this medicine in children. While this drug may be prescribed for children as young as 10 years for selected conditions, precautions do apply. Patients over age 32 years may have a stronger reaction to this medicine and need smaller doses. Overdosage: If you think you have taken too much of this medicine contact a poison control center or emergency room at once. NOTE: This medicine is only for you. Do not share this medicine with others. What if I miss a dose? If you miss a dose, take it as soon as you can. If it is almost time for your next dose, take only that dose. Do not take double or extra doses. What may interact with this medicine? Do not take this medicine with any of the following medications:  cisapride  dronedarone  fluconazole  metoclopramide  pimozide  posaconazole  thioridazine This medicine may also interact with the following medications:  alcohol  antihistamines for allergy cough and cold  antiviral medicines for HIV or AIDS  atropine  certain medicines for bladder problems like oxybutynin, tolterodine  certain medicines for blood pressure  certain medicines for depression, anxiety, or psychotic disturbances  certain medicines for diabetes  certain medicines for stomach problems like dicyclomine, hyoscyamine  certain medicines for travel sickness like scopolamine  certain medicines for Parkinson's disease  certain medicines for seizures like carbamazepine, phenobarbital, phenytoin  cimetidine  erythromycin  ipratropium  other medicines that prolong the QT interval (cause an abnormal heart rhythm) like dofetilide  rifampin  steroid medicines like prednisone or cortisone This list may not describe all possible interactions. Give your health care provider a list of all the medicines, herbs, non-prescription drugs, or dietary supplements you use. Also tell them if you smoke,  drink alcohol, or use illegal drugs. Some items may interact with your medicine. What should I watch for while using this medicine? Visit your health care professional for regular checks on your progress. Tell your health care professional if symptoms do not start to get better or if they get worse. Do not stop taking except on your health care professional's advice. You may develop a severe reaction. Your health care professional will tell you how much medicine to take. You may need to have an eye exam before and during use of this medicine. This medicine may increase blood sugar. Ask your health care provider if changes in diet or medicines are needed if you have diabetes. Patients and their families should watch out for new or worsening depression or thoughts of suicide. Also watch out for sudden or severe changes in feelings such as feeling anxious, agitated, panicky, irritable, hostile, aggressive, impulsive, severely restless, overly excited and hyperactive, or not being able to sleep. If this happens, especially at the beginning of antidepressant treatment or after a change in dose, call your health care professional. Dennis Bast may get dizzy or drowsy. Do not drive, use machinery, or do anything that needs mental alertness until you know how this medicine affects you. Do not stand or sit up quickly, especially if you are an older patient. This reduces the risk of dizzy or fainting spells. Alcohol may interfere with the effect of this medicine. Avoid alcoholic drinks. This drug can cause problems with controlling your body temperature. It can lower the response of your body to cold temperatures. If possible, stay indoors during cold weather. If you must go outdoors, wear warm clothes. It can also lower the response of your body to heat. Do not overheat. Do not over-exercise. Stay out of the sun when possible. If you must be in the sun, wear cool clothing. Drink plenty of water. If you have trouble controlling  your body temperature, call your health care provider right away. What side effects may I notice from receiving this medicine? Side effects that you should report to your doctor or health care professional as soon as possible:  allergic reactions like skin rash, itching or hives, swelling of the face, lips, or tongue  breathing problems  changes in vision  confusion  elevated mood, decreased need for sleep, racing thoughts, impulsive behavior  eye pain  fast, irregular heartbeat  fever or chills, sore throat  inability to keep still  males: prolonged or painful erection  problems with balance, talking, walking  redness, blistering, peeling, or loosening of the skin, including inside the mouth  seizures  signs and symptoms of high blood sugar such as being more thirsty or hungry or having to urinate more than normal. You may also feel very tired or have blurry vision  signs and symptoms of hypothyroidism like fatigue; increased sensitivity to cold; weight gain; hoarseness; thinning hair  signs and symptoms of low blood pressure like dizziness; feeling faint or lightheaded; falls; unusually weak or tired  signs and symptoms of neuroleptic malignant syndrome like confusion; fast, irregular heartbeat; high fever; increased sweating; stiff muscles  sudden numbness or weakness of the face, arm, or leg  suicidal thoughts or other mood changes  trouble swallowing  uncontrollable movements of the arms,  face, head, mouth, neck, or upper body Side effects that usually do not require medical attention (report to your doctor or health care professional if they continue or are bothersome):  change in sex drive or performance  constipation  drowsiness  dry mouth  upset stomach  weight gain This list may not describe all possible side effects. Call your doctor for medical advice about side effects. You may report side effects to FDA at 1-800-FDA-1088. Where should I keep my  medicine? Keep out of the reach of children. Store at room temperature between 15 and 30 degrees C (59 and 86 degrees F). Throw away any unused medicine after the expiration date. NOTE: This sheet is a summary. It may not cover all possible information. If you have questions about this medicine, talk to your doctor, pharmacist, or health care provider.  2020 [employer] (2019-04-13 11:59:11)

## 2020-05-02 NOTE — Progress Notes (Signed)
FOLLOW UP 3 MONTH  Assessment:    Essential hypertension - fairly controlled off of medications, DASH diet, exercise and monitor at home. Call if greater than 130/80.  -     CBC with Differential/Platelet -     CMP/GFR -     TSH  PVD (peripheral vascular disease) (HCC) Control blood pressure, cholesterol, glucose, increase exercise.   PAF ([place]) Monitoring only, off of elequis due to risk factors; follows with cardiology.   Atherosclerosis of aorta ([place]) Per CT 02/09/20 Control blood pressure, cholesterol, glucose, increase exercise.   COPD No triggers, well controlled symptoms, cont to monitor  Hypothyroidism, unspecified type Hypothyroidism-check TSH level, continue medications the same, reminded to take on an empty stomach 30-44mins before food.  -     TSH  Pure hypercholesterolemia -continue medications, check lipids at routine follow ups, decrease fatty foods, increase activity.   Medication management Continued  Aortic stenosis, severe S/p TAVR, follows with cardiology   OSA and COPD overlap syndrome ([place]) Not on CPAP, states can not wear, STRONGLY SUGGEST getting on it with heart/lung history, given information and will consider  COPD with asthma ([place]) Not on CPAP, states can not wear, STRONGLY SUGGEST getting on it with heart/lung history, given information and will consider Follow up pulmonary  Cavitating mass in left upper lung lobe Follow up pulmonary   Hiatal hernia Monitor  Gastroesophageal reflux disease, esophagitis presence not specified Continue PPI/H2 blocker, diet discussed  BPH w/LUTS monitor  Vitamin D deficiency Continue supplement  Prostate cancer ([place]) Continue follow up Following with Dr. [name]  Esophageal stricture/dysphagia Improved s/p esophageal dilation; continue PPI, carafate;   Severe protein calorie malnutrition ([place]) Check CMP, continue   Dementia ([place]) Suspect vascular etiology at baseline considering risk  factors Labs have been normal,  Discussed and proceed with brain imaging which was never obtained for baseline Family typically managing needs well, looking into home health to assist some days  Episodic confusion/new intermittent visual hallucinations Visual hallucinations Rx seroquel 25mg  nightly Keep close follow up as scheduled in 2 weeks, sooner if changes Will go to the ER if falls with head injury, new severe headache, changes vision/speech, imbalance, weakness.   Over 30 minutes of face to fae interview, exam, counseling, chart review and critical decision making was performed.   Future Appointments  Date Time Provider [place]  05/02/2020  3:30 PM Garnet Sierras, NP [facility] None  06/06/2020  3:45 PM Suzzanne Cloud, NP [facility] None  06/07/2020  9:00 AM MC-CV NL VASC 2 [facility]  06/07/2020 10:00 AM MC-CV NL VASC 2 [facility]  06/13/2020 11:30 AM [name], MD [facility]  07/30/2020  2:00 PM [name], MD [facility] None     Subjective:  Robert Schaefer is a 79 y.o. male who presents for Medicare Annual Wellness Visit and follow up   He reports that he is seeing people in his living room, does not known their name  He says that he does not know their names but he tries to get them to leave with his words.  He gets angry as they are in the way of the television and he can not see the football gam. He will ask where they live and that they need to go home, when they are home.  He is argumentative in office with his wife saying he is talking about going home to their other house over 20 years ago.  His wife reports that their daughter wanted her  to bring up the instances of seeing people.  He has had memory concerns for several years, intermittent in flares, has good days and bad days, have suspected vascular etiology, normal labs and no baseline brain imaging. Presents for 2 weeks of increased episodes of confusion and new visual  hallucinations. Accompanied by wife and daughter today who supplement history.   Today is lucid, but family reports he has had several bad days, was reportedly worst last week, will wake up confused and not himself, seeing people who aren't there and will interact with them, has had some agitation as he perceives these people are trespassing, "coming into my house" - easily redirected for the most part if family points out there is not one there. He has apparently called 911 on several occasions in recent weeks due to these concerns in the middle of the night. This is very atypical for him.    Denies urinary sx, fever/chills, fatigue/malaise, has reported some coughing episodes but rare, denies CP, dyspnea, dizziness, HA, new vision changes (reports intermittent R eye double vision worse in poor lighting ongoing for 8-9 months since last new glasses), numbness/tingling, word finding difficulty, slurred words with episodes of confusion.   Does have chronic unsteady gait, uses walker, falls some but none notable in recent weeks, denies known head injury.   Per family at baseline and himself they are managing well, lives with wife, daughter [name] checks on them daily, she drives to and from appointments. Patient no longer drives. Wife heats up food.  Wife assists with hygiene, needs to remind and supervise dressing/bathing.  Balance is a big concern, ambulates with walker in the past year, several falls this year, no known big injury.   Looking into hiring home health, working with a [place].   No baseline brain imaging for review; he has had carotid ultrasound in 2019 without remarkable stenosis.   Patient has hx/o OSA but is not on CPAP due to mask intolerance. Patient has hx/o a cavitary LUL lung lesion followed by Dr [name] with lesion resolving & felt due to infectious process. Patient has hx/o GERD& Achalasia & esophageal stricture dilated by Dr Silverio Decamp. Recently in 08/26/2019 presented to  ED with esophageal obstruction, had botox injection and underwent EGD 08/2019 which benign appearing esophageal stricture which was dilated. He is on protonix 40 mg BID, carafate; taking ensure. Per family has had no difficulty eating since this procedure. He was well immediately after procedure for several days prior to onset of current sx.   History of PAD s/p  intervention with Dr. [name] to right common iliac and mesenteric. He had severe AS and underwent TAVR by Dr. Argentina Ponder and did well. He had normal stress test 03/2016. He has COPD, is ex smoker, ~100 pack year history, quit 10 years ago. He has had PAF noted during admissions, follows with Dr. [name], was on low dose elequis but this was discontinued following 14 day holter recently which did not show a. Fib. he did have sinus bradycardia in the 30s with 3-second pauses. Known LBBB. Has been referred to EP, Dr. Curt Bears is following.   He has had elevated blood pressure. His blood pressure has been controlled at home, today their BP is   He does not workout. He denies chest pain, dizziness, some exertional dyspnea, consistent with baseline with known COPD.    There is no height or weight on file to calculate BMI., he is working on diet and exercise. Taking ensure due to dysphagia  and protein calorie malnutrition.  Wt Readings from Last 3 Encounters:  02/07/20 139 lb (63 kg)  01/16/20 141 lb 1.6 oz (64 kg)  12/13/19 136 lb (61.7 kg)   He is following with Dr. [name] for prostate cancer, seeing every 3-6 months.   He is not on cholesterol medication and denies myalgias. His cholesterol is not at goal of 70 or less but not aggressively pursued due protein/calorie malnutrition.  The cholesterol last visit was:   Lab Results  Component Value Date   CHOL 167 01/16/2020   HDL 59 01/16/2020   LDLCALC 90 01/16/2020   TRIG 90 01/16/2020   CHOLHDL 2.8 01/16/2020   He has had prediabetes for years recently resolve to normal range with  protein/calorie malnutrition consequent of dementia and esophageal stricture.   Last A1C in the office was:  Lab Results  Component Value Date   HGBA1C 5.1 01/16/2020   Last GFR: Lab Results  Component Value Date   GFRNONAA 81 01/16/2020   Patient is on Vitamin D supplement, taking 5000 IU a day.   Lab Results  Component Value Date   VD25OH 46 01/16/2020   He is on thyroid medication. His medication was not changed last visit.   Lab Results  Component Value Date   TSH 3.63 01/16/2020  .  Lab Results  Component Value Date   BHALPFXT02 409 12/05/2019      Medication Review: Current Outpatient Medications on File Prior to Visit  Medication Sig Dispense Refill  . albuterol (VENTOLIN HFA) 108 (90 Base) MCG/ACT inhaler Use 2 inhalations    15 minutes Apart    every 4 hours     if needed to rescue Asthma 48 g 0  . aspirin EC 81 MG tablet Take 81 mg by mouth daily.    . Cholecalciferol (VITAMIN D-3) 5000 units TABS Take 5,000 Units by mouth daily at 3 pm.    . feeding supplement, ENSURE ENLIVE, (ENSURE ENLIVE) LIQD Take 237 mLs by mouth 5 (five) times daily.     . fluticasone (FLONASE) 50 MCG/ACT nasal spray Place 2 sprays into both nostrils daily as needed for allergies or rhinitis.     [name] levothyroxine (SYNTHROID) 50 MCG tablet Take 1 tablet daily on an empty stomach with only water for 30 minutes & no Antacid meds, Calcium or Magnesium for 4 hours & avoid Biotin 90 tablet 3  . pantoprazole (PROTONIX) 40 MG tablet TAKE ONE TABLET BY MOUTH EVERY MORNING and TAKE ONE TABLET BY MOUTH EVERYDAY AT BEDTIME 30 tablet 4  . sucralfate (CARAFATE) 1 g tablet TAKE ONE TABLET BY MOUTH EVERY MORNING and TAKE ONE TABLET BY MOUTH AT NOON and TAKE ONE TABLET BY MOUTH EVERY EVENING and TAKE ONE TABLET BY MOUTH EVERYDAY AT BEDTIME 120 tablet 0   No current facility-administered medications on file prior to visit.    Current Problems (verified) Patient Active Problem List   Diagnosis Date Noted  .  Gait abnormality 12/05/2019  . Dementia ([place]) 10/03/2019  . COPD (chronic obstructive pulmonary disease) ([place])   . Esophageal obstruction due to food impaction   . Esophageal diverticulum   . Sinus bradycardia 05/24/2019  . Abnormal glucose 12/09/2018  . Esophageal diverticular disease   . Loss of weight   . Severe protein-calorie malnutrition ([place])   . Paroxysmal atrial fibrillation ([place]) 06/15/2018  . S/P TAVR (transcatheter aortic valve replacement) 03/16/2018  . Prostate cancer ([place])   . OSA (obstructive sleep apnea)   . Achalasia   .  Severe aortic stenosis   . Esophageal dysphagia   . Esophageal stricture   . Atherosclerosis of aorta ([place]) 09/15/2016  . Cavitating mass in left upper lung lobe 08/12/2016  . PVD (peripheral vascular disease) ([place]) 05/31/2016  . Hypothyroidism 11/07/2014  . BPH w/LUTS 11/07/2014  . Medication management 04/11/2014  . Vitamin D deficiency 07/05/2013  . Hyperlipidemia, mixed   . RLS (restless legs syndrome)   . Essential hypertension 09/28/2009  . GERD 09/28/2009    Screening Tests Immunization History  Administered Date(s) Administered  . DT (Pediatric) 09/22/2013  . Influenza, High Dose Seasonal PF 04/11/2014, 04/20/2015  . Zoster 06/30/2005    Preventative care: Last colonoscopy: 2011 PFT 07/2016 EGD 08/2019 CT chest 01/2018 - aortic atherosclerosis CXR: 08/2019 MGM 2015 US carotid 09/2014 Echo 09/20200 Stress test 03/2016 Cath 12/2017  Prior vaccinations: TD or Tdap: 2015  Influenza: 2016 defer today   Pneumococcal:  Prevnar13:  Shingles/Zostavax: 2007  Names of Other Physician/Practitioners you currently use: 1. [facility] here for primary care 2. Dr. Truman Hayward, eye doctor, last visit 2020,  3. Dr.Jones , dentist, last visit 2017   Patient Care Team: [name], MD as PCP - General (Internal Medicine) [name], MD as PCP - Cardiology (Cardiology) Jari Pigg, MD as  Consulting Physician (Dermatology) Larey Dresser, MD as Consulting Physician (Cardiology) Carolan Clines, MD (Inactive) as Consulting Physician (Urology) Erroll Luna, MD as Consulting Physician (General Surgery) Josue Hector, MD as Consulting Physician (Cardiology) Michael Boston, MD as Consulting Physician (General Surgery) Armbruster, Carlota Raspberry, MD as Consulting Physician (Gastroenterology) [name], MD as Consulting Physician (Urology)  Allergies Allergies  Allergen Reactions  . Penicillins Anaphylaxis, Itching, Swelling and Other (See Comments)  . Sulfa Antibiotics Anaphylaxis and Swelling  . Versed [Midazolam] Other (See Comments)    Confusion, delerium    SURGICAL HISTORY He  has a past surgical history that includes Colonoscopy; Pilonidal cyst / sinus excision (2000); Insertion of mesh (Right, 02/23/2014); Inguinal hernia repair (Right, 02/23/2014); Hernia repair; Cataract extraction w/ intraocular lens  implant, bilateral (Bilateral); Esophagogastroduodenoscopy (egd) with esophageal dilation (last one 11-09-2015  dr Yolanda Bonine); Cardiac catheterization (N/A, 05/29/2016); Prostate biopsy (09-22-2016  dr [name] office); Cardiovascular stress test (04-23-2016   dr berry); transthoracic echocardiogram (05-05-2016   dr berry); Gold seed implant (N/A, 04/29/2017); SPACE OAR INSTILLATION (N/A, 04/29/2017); Upper gastrointestinal endoscopy; Esophageal manometry (N/A, 10/12/2017); Esophagogastroduodenoscopy (N/A, 10/12/2017); Savory dilation (N/A, 10/12/2017); Cardiac catheterization (Bilateral, 01/25/2018); Anal fistulectomy (2010); RIGHT/LEFT HEART CATH AND CORONARY ANGIOGRAPHY (N/A, 01/25/2018); ABDOMINAL AORTOGRAM (N/A, 01/25/2018); Transcatheter aortic valve replacement, transapical (Left, 03/16/2018); Esophagogastroduodenoscopy (egd) with propofol (N/A, 11/17/2018); Savory dilation (N/A, 11/17/2018); Balloon dilation (N/A, 11/17/2018); Foreign Body Removal (11/17/2018);  Esophagogastroduodenoscopy (egd) with propofol (N/A, 08/26/2019); Botox injection (08/26/2019); Foreign Body Removal (08/26/2019); Esophagogastroduodenoscopy (egd) with propofol (N/A, 09/20/2019); Balloon dilation (N/A, 09/20/2019); and Foreign Body Removal (09/20/2019). FAMILY HISTORY His family history includes Asthma in his father; Heart attack in his mother; Stroke in his father. SOCIAL HISTORY He  reports that he quit smoking about 14 years ago. His smoking use included cigarettes. He has a 50.00 pack-year smoking history. He has never used smokeless tobacco. He reports that he does not drink alcohol and does not use drugs.  Review of Systems  Constitutional: Negative for chills, fever, malaise/fatigue and weight loss.  HENT: Negative for ear pain, hearing loss and tinnitus.   Eyes: Negative for blurred vision, double vision, photophobia and pain.  Respiratory: Negative for cough and hemoptysis.  Cardiovascular: Negative for chest pain, palpitations, orthopnea and claudication.  Gastrointestinal: Negative for abdominal pain, blood in stool, constipation, diarrhea, heartburn, nausea and vomiting.  Genitourinary: Negative for dysuria, frequency and urgency.  Musculoskeletal: Negative for back pain, myalgias and neck pain.  Skin: Negative for itching and rash.  Neurological: Negative for dizziness, tingling, tremors, sensory change and headaches.  Psychiatric/Behavioral: Positive for hallucinations. Negative for depression, substance abuse and suicidal ideas. The patient is not nervous/anxious.      Objective:     There were no vitals filed for this visit. There is no height or weight on file to calculate BMI.  General appearance: alert, no distress, WD/WN, male HEENT: normocephalic, sclerae anicteric, TMs pearly, nares patent, no discharge or erythema, pharynx normal Oral cavity: MMM, no lesions Neck: supple, no lymphadenopathy, no thyromegaly, no masses Heart: RRR, normal S1, S2, no  murmur heard today.  Lungs: CTA , no wheezes, rhonchi, or rales, ? Mildly decreased in bil bases, R < L, some rales cleared with cough Abdomen: +bs, soft, non tender, non distended, no masses, no hepatomegaly, no splenomegaly Musculoskeletal: nontender, no swelling, no obvious deformity Extremities: no edema, no cyanosis, no clubbing Pulses: 2+ symmetric, upper and lower extremities, normal cap refill Neurological: alert, oriented x 3, CN2-12 intact, symmetrical strength normal upper extremities and lower extremities, sensation normal throughout, DTRs 2+ throughout, slow rapid alternating, heel to shin, cannot stand without assistance for Romberg, gait is slow and shuffling with walker, poor short term recall Psychiatric: normal affect, behavior normal, pleasant   Medicare Attestation I have personally reviewed: The patient's medical and social history Their use of alcohol, tobacco or illicit drugs Their current medications and supplements The patient's functional ability including ADLs,fall risks, home safety risks, cognitive, and hearing and visual impairment Diet and physical activities Evidence for depression or mood disorders  The patient's weight, height, BMI, and visual acuity have been recorded in the chart.  I have made referrals, counseling, and provided education to the patient based on review of the above and I have provided the patient with a written personalized care plan for preventive services.     Garnet Sierras, NP   05/02/2020

## 2020-05-03 LAB — CBC WITH DIFFERENTIAL/PLATELET
Absolute Monocytes: 364 cells/uL (ref 200–950)
Basophils Absolute: 52 cells/uL (ref 0–200)
Basophils Relative: 1.3 %
Eosinophils Absolute: 212 cells/uL (ref 15–500)
Eosinophils Relative: 5.3 %
HCT: 39.9 % (ref 38.5–50.0)
Hemoglobin: 13.9 g/dL (ref 13.2–17.1)
Lymphs Abs: 880 cells/uL (ref 850–3900)
MCH: 32 pg (ref 27.0–33.0)
MCHC: 34.8 g/dL (ref 32.0–36.0)
MCV: 91.9 fL (ref 80.0–100.0)
MPV: 11 fL (ref 7.5–12.5)
Monocytes Relative: 9.1 %
Neutro Abs: 2492 cells/uL (ref 1500–7800)
Neutrophils Relative %: 62.3 %
Platelets: 132 10*3/uL — ABNORMAL LOW (ref 140–400)
RBC: 4.34 10*6/uL (ref 4.20–5.80)
RDW: 13.9 % (ref 11.0–15.0)
Total Lymphocyte: 22 %
WBC: 4 10*3/uL (ref 3.8–10.8)

## 2020-05-03 LAB — COMPLETE METABOLIC PANEL WITH GFR
AG Ratio: 1.7 (calc) (ref 1.0–2.5)
ALT: 11 U/L (ref 9–46)
AST: 14 U/L (ref 10–35)
Albumin: 4.3 g/dL (ref 3.6–5.1)
Alkaline phosphatase (APISO): 78 U/L (ref 35–144)
BUN: 12 mg/dL (ref 7–25)
CO2: 31 mmol/L (ref 20–32)
Calcium: 10.3 mg/dL (ref 8.6–10.3)
Chloride: 105 mmol/L (ref 98–110)
Creat: 0.92 mg/dL (ref 0.70–1.11)
GFR, Est African American: 91 mL/min/{1.73_m2} (ref >=60)
GFR, Est Non African American: 78 mL/min/{1.73_m2} (ref >=60)
Globulin: 2.5 g/dL (calc) (ref 1.9–3.7)
Glucose, Bld: 120 mg/dL — ABNORMAL HIGH (ref 65–99)
Potassium: 4.8 mmol/L (ref 3.5–5.3)
Sodium: 142 mmol/L (ref 135–146)
Total Bilirubin: 0.6 mg/dL (ref 0.2–1.2)
Total Protein: 6.8 g/dL (ref 6.1–8.1)

## 2020-05-03 LAB — TSH: TSH: 3.23 mIU/L (ref 0.40–4.50)

## 2020-05-09 ENCOUNTER — Other Ambulatory Visit: Payer: Self-pay | Admitting: Gastroenterology

## 2020-05-09 NOTE — Progress Notes (Signed)
Gave patient's daughter Robert Schaefer) his lab results. I asked her how the Seroquel was working from him and she stated that she believes her mother told her that it was making him like a "Zombie" and he was sleeping too much. I told Mrs. Robert Schaefer that we would be back in touch with your recommendations. -e welch

## 2020-05-11 NOTE — Progress Notes (Signed)
Spoke to patient via telephone.  Reports improvement, no hallucinations, he has been sleepy.  Discussed taking half tablet of Seroquel.  Requests I speak with his daughter [name].  Called and left voicemail to return call.  Garnet Sierras, Laqueta Jean, DNP [facility] 05/11/2020  1:26 PM

## 2020-05-16 ENCOUNTER — Ambulatory Visit: Payer: Medicare Other | Admitting: Adult Health Nurse Practitioner

## 2020-05-30 ENCOUNTER — Telehealth: Payer: Self-pay | Admitting: Neurology

## 2020-05-30 NOTE — Telephone Encounter (Signed)
Patient is currently scheduled to see Judson Roch on 06/06/20 at 345pm. Judson Roch will not be available at that time. I called the patient but he did not answer. I left a detailed message for him to call back. There is a held spot for 245pm on the same day if he wishes to take this slot. Will await his call back.

## 2020-06-06 ENCOUNTER — Ambulatory Visit: Payer: Medicare Other | Admitting: Neurology

## 2020-06-07 ENCOUNTER — Ambulatory Visit (HOSPITAL_COMMUNITY)
Admission: RE | Admit: 2020-06-07 | Discharge: 2020-06-07 | Disposition: A | Discharge status: Home / Self Care | Payer: Medicare Other | Source: Ambulatory Visit | Attending: Cardiology | Admitting: Cardiology

## 2020-06-07 ENCOUNTER — Other Ambulatory Visit: Payer: Self-pay

## 2020-06-07 ENCOUNTER — Other Ambulatory Visit (HOSPITAL_COMMUNITY): Payer: Self-pay | Admitting: Cardiovascular Disease

## 2020-06-07 DIAGNOSIS — I739 Peripheral vascular disease, unspecified: Secondary | ICD-10-CM

## 2020-06-07 DIAGNOSIS — Z95828 Presence of other vascular implants and grafts: Secondary | ICD-10-CM

## 2020-06-12 ENCOUNTER — Ambulatory Visit: Payer: Medicare Other | Admitting: Cardiovascular Disease

## 2020-06-12 DIAGNOSIS — C61 Malignant neoplasm of prostate: Secondary | ICD-10-CM | POA: Diagnosis not present

## 2020-06-13 ENCOUNTER — Encounter: Payer: Self-pay | Admitting: Cardiovascular Disease

## 2020-06-13 ENCOUNTER — Ambulatory Visit (INDEPENDENT_AMBULATORY_CARE_PROVIDER_SITE_OTHER): Payer: Medicare Other | Admitting: Cardiovascular Disease

## 2020-06-13 ENCOUNTER — Other Ambulatory Visit: Payer: Self-pay

## 2020-06-13 DIAGNOSIS — I739 Peripheral vascular disease, unspecified: Secondary | ICD-10-CM

## 2020-06-13 DIAGNOSIS — I350 Nonrheumatic aortic (valve) stenosis: Secondary | ICD-10-CM | POA: Diagnosis not present

## 2020-06-13 DIAGNOSIS — K551 Chronic vascular disorders of intestine: Secondary | ICD-10-CM | POA: Insufficient documentation

## 2020-06-13 DIAGNOSIS — I10 Essential (primary) hypertension: Secondary | ICD-10-CM | POA: Diagnosis not present

## 2020-06-13 DIAGNOSIS — E782 Mixed hyperlipidemia: Secondary | ICD-10-CM | POA: Diagnosis not present

## 2020-06-13 NOTE — Assessment & Plan Note (Signed)
History of severe aortic stenosis status post TAVR by Drs. Cooper and [place] after a heart cath which I performed 01/25/2018 revealed critical aortic stenosis with normal coronary arteries.  He had an excellent clinical result.  He is completely asymptomatic.  Recent 2D echo performed 04/13/2020 revealed normal LV systolic function with a well-functioning aortic bioprosthesis and trivial perivalvular leak.

## 2020-06-13 NOTE — Assessment & Plan Note (Signed)
95% high-grade calcified ostial SMA stenosis at the time of angiography 05/29/2016.  He really denies symptoms of mesenteric ischemia.

## 2020-06-13 NOTE — Assessment & Plan Note (Signed)
History of essential hypertension a blood pressure measured today 140/60.  He is not on antihypertensive medications.

## 2020-06-13 NOTE — Assessment & Plan Note (Signed)
History of hyperlipidemia currently not on statin therapy with lipid profile performed 01/16/2020 revealing total cholesterol 167, LDL of 90 and HDL 59.

## 2020-06-13 NOTE — Patient Instructions (Addendum)
Medication Instructions:  Your physician recommends that you continue on your current medications as directed. Please refer to the Current Medication list given to you today.  *If you need a refill on your cardiac medications before your next appointment, please call your pharmacy*  Lab Work: NONE   Testing/Procedures: Your physician has requested that you have an echocardiogram. Echocardiography is a painless test that uses sound waves to create images of your heart. It provides your doctor with information about the size and shape of your heart and how well your hearts chambers and valves are working. This procedure takes approximately one hour. There are no restrictions for this procedure. [place] [address]   AORTA/ILIAC ULTRASOUND IN 1 YEAR   Your physician has requested that you have an ankle brachial index (ABI). During this test an ultrasound and blood pressure cuff are used to evaluate the arteries that supply the arms and legs with blood. Allow thirty minutes for this exam. There are no restrictions or special instructions. 1 YEAR   Follow-Up: At [facility], you and your health needs are our priority.  As part of our continuing mission to provide you with exceptional heart care, we have created designated Provider Care Teams.  These Care Teams include your primary Cardiologist (physician) and Advanced Practice Providers (APPs -  Physician Assistants and Nurse Practitioners) who all work together to provide you with the care you need, when you need it.  We recommend signing up for the patient portal called "MyChart".  Sign up information is provided on this After Visit Summary.  MyChart is used to connect with patients for Virtual Visits (Telemedicine).  Patients are able to view lab/test results, encounter notes, upcoming appointments, etc.  Non-urgent messages can be sent to your provider as well.   To learn more about what you can do with MyChart,  go to [email or web address].    Your next appointment:   6 month(s)  The format for your next appointment:   In Person  Provider:   You will se one of the following   Kerin Ransom, PA-C  Sande Rives, PA-C  Coletta Memos, FNP  FOLLOW UP WITH DR [name] IN 1 YEAR  You will receive a reminder letter in the mail two months in advance. If you don't receive a letter, please call our office to schedule the follow-up appointment.

## 2020-06-13 NOTE — Assessment & Plan Note (Signed)
History of PAD status post PTA and covered stenting of a right common iliac artery CTO and high-grade left common iliac artery ostial lesion with VBX covered stents.  He did have a 70 to 80% calcified mid segmental right SFA stenosis with three-vessel runoff bilaterally.  His most recent Doppler studies performed 06/07/2020 revealed a right ABI of 1.32 and a left of 0.97.  He did have high-frequency signals in his right iliac artery distal to the stent although he denies claudication.  We will continue to monitor him noninvasively.

## 2020-06-13 NOTE — Progress Notes (Signed)
06/13/2020 Robert Schaefer   Jul 14, 1940  [id number]  Primary Physician [name], MD Primary Cardiologist: [name] MD Lupe Car[place], [place]  HPI:  Robert Schaefer is a 79 y.o.  married Caucasian male father of 2 children, grandfather 5 grandchildren who is accompanied by his wife Robert Schaefer. He was referred by Dr. Melford Aase for cardiovascular evaluation because of chest pain and claudication. I last saw him in the office6/15/2021. His cardiovascular risk factor profile is notable for 100 pack years of tobacco abuse having quit 10 years ago. He has treated hypertension and hyperlipidemia. There is a family history of heart disease with a mother who died of a myocardial infarction age 41. He has never had a heart attack or stroke. He has had recent chest pain with a CT angiogram that ruled out pulmonary embolus but did show diffuse coronary calcification. An abdominal pelvic CT performed because of unexplained weight loss showed aortoiliac calcification size/atherosclerosis as well as atherosclerosis of the superior mesenteric artery and left renal artery. He complains of right greater than leftleg claudication of the last year occurring after walking 4-5 minutes. Since I saw him in the office he's had a Myoview stress test which was nonischemic problem, a 2-D echo that showed normal LV function with moderate aortic stenosis, carotid Dopplers that showed no evidence of ICA stenosis and lower extremity Dopplers reveal a right ABI of 0.48 with an occluded right common iliac and a left ABI of 1.23 with moderate left common iliac artery stenosis. He also complains a 30 pound weight loss of and had SMA calcification on abdominal CT. He may have mesenteric ischemia as well.He underwent peripheral angiography and intervention by myself 05/29/16 demonstrating a small abdominal aortic aneurysm, 95% calcified ostial superior mesenteric artery stenosis, right common iliac chronic total occlusion  was calcified and high-grade common iliac artery stenosis. He underwent diamondback orbital rotational atherectomy, PTA and covered stenting using [employer] covered stents" of both common iliac arteries using "kissing stent technique. His claudication symptoms have resolved. The ABIs are normal. She does have a high-frequency signal in his right common iliac artery demonstrated a duplex ultrasound 06/09/16 was asymptomatic from this.  I did his heart cath 01/25/2018 which revealed critical left ear, normal coronary arteries and widely patent iliac stents he underwent a transapical TAVR procedure by Drs. Cooper and [place] with an excellent clinical result. His follow-up echo revealed a normally functioning bioprosthetic aortic prosthesis, normal LV functionand a perivalvular leak with mild to moderate AI.[name] He is clinically improved. He was noted to be in PAF transiently during his hospitalization converting to sinus rhythm and he was begun on low-dose Eliquis.  Since I saw him  in the office 6 months ago he continues to do well.  His weight is stable.  He denies chest pain or shortness of breath.  He walks with a walker.  Recent lower extremity arterial Doppler studies performed 06/07/2020 revealed a right ABI of 1.32 and a left of 0.97.  He did have a high-frequency signal in his right common iliac artery distal to the stent although he denies claudication.  A 2D echo performed 04/13/2020 revealed normal LV systolic function with a well-functioning TAVR.    Current Meds  Medication Sig  . albuterol (VENTOLIN HFA) 108 (90 Base) MCG/ACT inhaler Use 2 inhalations    15 minutes Apart    every 4 hours     if needed to rescue Asthma  . aspirin EC 81 MG tablet  Take 81 mg by mouth daily.  . Cholecalciferol (VITAMIN D-3) 5000 units TABS Take 5,000 Units by mouth daily at 3 pm.  . feeding supplement, ENSURE ENLIVE, (ENSURE ENLIVE) LIQD Take 237 mLs by mouth 5 (five) times daily.   . fluticasone (FLONASE)  50 MCG/ACT nasal spray Place 2 sprays into both nostrils daily as needed for allergies or rhinitis.   [name] levothyroxine (SYNTHROID) 50 MCG tablet Take 1 tablet daily on an empty stomach with only water for 30 minutes & no Antacid meds, Calcium or Magnesium for 4 hours & avoid Biotin  . pantoprazole (PROTONIX) 40 MG tablet TAKE ONE TABLET BY MOUTH EVERY MORNING and TAKE ONE TABLET BY MOUTH EVERYDAY AT BEDTIME  . QUEtiapine (SEROQUEL) 25 MG tablet Take 1 tablet (25 mg total) by mouth at bedtime.  . sucralfate (CARAFATE) 1 g tablet TAKE ONE TABLET BY MOUTH EVERY MORNING and TAKE ONE TABLET BY MOUTH AT NOON and TAKE ONE TABLET BY MOUTH EVERY EVENING and TAKE ONE TABLET BY MOUTH EVERYDAY AT BEDTIME     Allergies  Allergen Reactions  . Penicillins Anaphylaxis, Itching, Swelling and Other (See Comments)  . Sulfa Antibiotics Anaphylaxis and Swelling  . Versed [Midazolam] Other (See Comments)    Confusion, delerium    Social History   Socioeconomic History  . Marital status: Married    Spouse name: Not on file  . Number of children: Not on file  . Years of education: Not on file  . Highest education level: Not on file  Occupational History  . Not on file  Tobacco Use  . Smoking status: Former Smoker    Packs/day: 1.00    Years: 50.00    Pack years: 50.00    Types: Cigarettes    Quit date: 07/05/2005    Years since quitting: 14.9  . Smokeless tobacco: Never Used  Vaping Use  . Vaping Use: Never used  Substance and Sexual Activity  . Alcohol use: No  . Drug use: Never  . Sexual activity: Not Currently  Other Topics Concern  . Not on file  Social History Narrative  . Not on file   Social Determinants of Health   Financial Resource Strain: Not on file  Food Insecurity: Not on file  Transportation Needs: Not on file  Physical Activity: Not on file  Stress: Not on file  Social Connections: Not on file  Intimate Partner Violence: Not on file     Review of Systems: General:  negative for chills, fever, night sweats or weight changes.  Cardiovascular: negative for chest pain, dyspnea on exertion, edema, orthopnea, palpitations, paroxysmal nocturnal dyspnea or shortness of breath Dermatological: negative for rash Respiratory: negative for cough or wheezing Urologic: negative for hematuria Abdominal: negative for nausea, vomiting, diarrhea, bright red blood per rectum, melena, or hematemesis Neurologic: negative for visual changes, syncope, or dizziness All other systems reviewed and are otherwise negative except as noted above.    Blood pressure 140/60, pulse 60, height 5\' 6"  (1.676 m), weight 143 lb (64.9 kg).  General appearance: alert and no distress Neck: no adenopathy, no carotid bruit, no JVD, supple, symmetrical, trachea midline and thyroid not enlarged, symmetric, no tenderness/mass/nodules Lungs: clear to auscultation bilaterally Heart: regular rate and rhythm, S1, S2 normal, no murmur, click, rub or gallop Extremities: extremities normal, atraumatic, no cyanosis or edema Pulses: 2+ and symmetric Skin: Skin color, texture, turgor normal. No rashes or lesions Neurologic: Alert and oriented X 3, normal strength and tone. Normal symmetric reflexes. Normal  coordination and gait  EKG sinus rhythm at 60 with left bundle branch block unchanged from prior EKGs.  I personally reviewed this EKG.  ASSESSMENT AND PLAN:   Essential hypertension History of essential hypertension a blood pressure measured today 140/60.  He is not on antihypertensive medications.  Hyperlipidemia, mixed History of hyperlipidemia currently not on statin therapy with lipid profile performed 01/16/2020 revealing total cholesterol 167, LDL of 90 and HDL 59.  PVD (peripheral vascular disease) ([place]) History of PAD status post PTA and covered stenting of a right common iliac artery CTO and high-grade left common iliac artery ostial lesion with VBX covered stents.  He did have a 70 to 80%  calcified mid segmental right SFA stenosis with three-vessel runoff bilaterally.  His most recent Doppler studies performed 06/07/2020 revealed a right ABI of 1.32 and a left of 0.97.  He did have high-frequency signals in his right iliac artery distal to the stent although he denies claudication.  We will continue to monitor him noninvasively.  Severe aortic stenosis History of severe aortic stenosis status post TAVR by Drs. Cooper and [place] after a heart cath which I performed 01/25/2018 revealed critical aortic stenosis with normal coronary arteries.  He had an excellent clinical result.  He is completely asymptomatic.  Recent 2D echo performed 04/13/2020 revealed normal LV systolic function with a well-functioning aortic bioprosthesis and trivial perivalvular leak.  Superior mesenteric artery stenosis ([facility]) 95% high-grade calcified ostial SMA stenosis at the time of angiography 05/29/2016.  He really denies symptoms of mesenteric ischemia.      [name] MD FACP,FACC,FAHA, [facility] 06/13/2020 11:51 AM

## 2020-06-19 DIAGNOSIS — C61 Malignant neoplasm of prostate: Secondary | ICD-10-CM | POA: Diagnosis not present

## 2020-06-19 DIAGNOSIS — R351 Nocturia: Secondary | ICD-10-CM | POA: Diagnosis not present

## 2020-07-09 ENCOUNTER — Other Ambulatory Visit: Payer: Self-pay | Admitting: Internal Medicine

## 2020-07-09 ENCOUNTER — Other Ambulatory Visit: Payer: Self-pay | Admitting: Gastroenterology

## 2020-07-09 DIAGNOSIS — E039 Hypothyroidism, unspecified: Secondary | ICD-10-CM

## 2020-07-29 ENCOUNTER — Encounter: Payer: Self-pay | Admitting: Internal Medicine

## 2020-07-29 MED ORDER — PANTOPRAZOLE SODIUM 40 MG PO TBEC
DELAYED_RELEASE_TABLET | ORAL | 0 refills | Status: DC
Start: 2020-07-29 — End: 2020-09-11

## 2020-07-29 MED ORDER — SUCRALFATE 1 G PO TABS
ORAL_TABLET | ORAL | 0 refills | Status: DC
Start: 2020-07-29 — End: 2020-08-17

## 2020-07-29 NOTE — Progress Notes (Signed)
Comprehensive Evaluation & Examination     This very nice 80 y.o.  MWM presents for a Screening /Preventative Visit & comprehensive evaluation and management of multiple medical co-morbidities.  Patient has been followed for HTN,  ASHD/pAfib/ s/p TAVR, ASPVD, HLD, Prediabetes, Hypothyroidism  and Vitamin D Deficiency. Patient has hx/o GERD/Achalasia  is controlled on his meds. Patient has OSA w/mask intolerance. Patient has hx/o Prostate Cancer dx'd in 2018 by Dr [name] & treated by Radiation Tx. Last PSA in June 2021 was undetectable.       Labile HTN predates circa 1995. Patient's BP has been controlled off meds at home.  Today's . Patient has ASPVD with stenting of bilat Iliac Aa (2017) followed closely by Dr [name]. In July 2019, Heart Cath by Dr [name] found critical AoS & Nl Coronary Aa. Patient then underwent a TAVR  (Sept 2019) by Dr's Argentina Ponder. Patient had post-op pAfib & was initiated  on Eliquis by Dr [name].  Stress Myoview was reported Non-ischemic.  Patient denies any cardiac symptoms as chest pain, palpitations, shortness of breath, dizziness or ankle swelling.      Patient's hyperlipidemia is controlled with diet and medications. Patient denies myalgias or other medication SE's. Last lipids were at goal:  Lab Results  Component Value Date   CHOL 167 01/16/2020   HDL 59 01/16/2020   LDLCALC 90 01/16/2020   TRIG 90 01/16/2020   CHOLHDL 2.8 01/16/2020       Patient has hx/o prediabetes (A1c 6.1% /2002) and patient denies reactive hypoglycemic symptoms, visual blurring, diabetic polys or paresthesias. Last A1c was normal & at goal:   Lab Results  Component Value Date   HGB  A1C 5.1 01/16/2020  ===     Also, patient was dx'd Hypothyroid in 2014 and initiated on Thyroid replacement.      Finally, patient has history of Vitamin D Deficiency and last vitamin D was still low (goal 70-100):   Lab Results  Component Value Date   VD25OH 46 01/16/2020    Current  Outpatient Medications on File Prior to Visit  Medication Sig   albuterol HFA inhaler Use 2 inhalations 15 min Apart every 4 hrs if needed to rescue Asthma   aspirin EC 81 MG  Take  daily.   VITAMIN D 5000 units  Take daily at    ENSURE  Take 237 mLs  5 times daily.    FLONASE nasal spray Place 2 sprays into both nostrils daily as needed for allergies or rhinitis.    levothyroxine 50 MCG  TAKE ONE TABLET  BEFORE BREAKFAST   QUEtiapine  25 MG t Take 1 tablet  at bedtime.    Allergies  Allergen Reactions   Penicillins Anaphylaxis, Itching, Swelling   Sulfa Antibiotics Anaphylaxis and Swelling   Versed [Midazolam] Confusion, delerium    Past Medical History:  Diagnosis Date   AAA (abdominal aortic aneurysm) (HCC)    no longer monitoring was small area   Achalasia    Carotid stenosis, asymptomatic, bilateral    per duplex --- bilateral <50% ICA   COPD (chronic obstructive pulmonary disease) ([place])    pulmologist-  dr [name]   Diabetes mellitus without complication ([place])    diet controlled   Gait abnormality 12/05/2019   GERD (gastroesophageal reflux disease)    Hyperlipidemia    Hypertension    Hypothyroidism    Lesion of left lung    followed by dr [name] (pulmologist)  post infection scarring  Mild dementia (HCC)    OSA (obstructive sleep apnea)    has a cpap-  intolerant; due to [name] (01/25/2018)   Peripheral vascular disease [facility])    cardiologist-- dr berry-- per last ABI/Aorta/Iliac 06-09-2016  patent bilateral iliac kissing stents, >50% stenosis bilateral iliac artery (R>L) and left external iliac artery, patent IVC   Prostate cancer [facility])    urologist-  dr [name] oncologist-  dr [name]. Dx'd 2018 s/p XRT   RLS (restless legs syndrome)    S/P TAVR (transcatheter aortic valve replacement) 02/2018   s/p TAVR via transapical approach with an Edwards Sapien 3 THV (size 26 mm, model # 9600TFX, serial # [id number])   Schatzki's ring    Severe  aortic stenosis,hx    Wears dentures    Wears glasses    Health Maintenance  Topic Date Due   COVID-19 Vaccine (1) Never done   PNA vac Low Risk Adult (1 of 2 - PCV13) Never done   INFLUENZA VACCINE  01/29/2020   URINE MICROALBUMIN  07/10/2020   TETANUS/TDAP  09/23/2023   Immunization History  Administered Date(s) Administered   DT (Pediatric) 09/22/2013   Influenza, High Dose Seasonal PF 04/11/2014, 04/20/2015   Zoster 06/30/2005   Last Colon -   Past Surgical History:  Procedure Laterality Date   ABDOMINAL AORTOGRAM N/A 01/25/2018   Procedure: ABDOMINAL AORTOGRAM;  Surgeon: [name], MD;  Location: [place] [facility];  Service: Cardiovascular;  Laterality: N/A;   ANAL FISTULECTOMY  2010   BALLOON DILATION N/A 11/17/2018   Procedure: BALLOON DILATION;  Surgeon: [name], MD;  Location: [facility];  Service: Endoscopy;  Laterality: N/A;   BALLOON DILATION N/A 09/20/2019   Procedure: BALLOON DILATION;  Surgeon: [name], MD;  Location: [facility];  Service: Endoscopy;  Laterality: N/A;  intubation  use fluoroscopy   BOTOX INJECTION  08/26/2019   Procedure: BOTOX INJECTION;  Surgeon: Lavena Bullion, DO;  Location: [facility];  Service: Gastroenterology;;   CARDIAC CATHETERIZATION Bilateral 01/25/2018   CARDIOVASCULAR STRESS TEST  04-23-2016   dr berry   normal nuclear study w/ no ischemia/  normal LV function and wall function, nuclear stress ef 57%   CATARACT EXTRACTION W/ INTRAOCULAR LENS  IMPLANT, BILATERAL Bilateral    COLONOSCOPY     ESOPHAGEAL MANOMETRY N/A 10/12/2017   Procedure: ESOPHAGEAL MANOMETRY (EM);  Surgeon: [name], MD;  [facility];  Service: Gastroenterology;  Laterality: N/A;   ESOPHAGOGASTRODUODENOSCOPY N/A 10/12/2017   Procedure: ESOPHAGOGASTRODUODENOSCOPY (EGD);  Surgeon: [name], MD;  Location[facility];  Service: Gastroenterology;  Laterality: N/A;    ESOPHAGOGASTRODUODENOSCOPY (EGD) WITH ESOPHAGEAL DILATION  last one 11-09-2015  dr Yolanda Bonine   dilation schatzki's ring   ESOPHAGOGASTRODUODENOSCOPY (EGD) WITH PROPOFOL N/A 11/17/2018   Procedure: ESOPHAGOGASTRODUODENOSCOPY;  Surgeon: [name], MD;  [facility];  Service: Endoscopy;  Laterality: N/A;   ESOPHAGOGASTRODUODENOSCOPY (EGD) WITH PROPOFOL N/A 08/26/2019   Procedure: ESOPHAGOGASTRODUODENOSCOPY (EGD) WITH PROPOFOL;  Surgeon: Lavena Bullion, DO;  [facility];  Service: Gastroenterology;  Laterality: N/A;   ESOPHAGOGASTRODUODENOSCOPY (EGD) WITH PROPOFOL N/A 09/20/2019   Procedure: ESOPHAGOGASTRODUODENOSCOPY (EGD) WITH PROPOFOL;  Surgeon: [name], MD;  [facility];  Service: Endoscopy;  Laterality: N/A;  with intubation fluoro needed   FOREIGN BODY REMOVAL  11/17/2018   Procedure: FOREIGN BODY REMOVAL;  Surgeon: [name], MD;  [facility];  Service: Endoscopy;;   FOREIGN BODY REMOVAL  08/26/2019   Procedure: FOREIGN  BODY REMOVAL;  Surgeon: Shellia Cleverlyirigliano, Vito V, DO;  [facility];  Service: Gastroenterology;;   FOREIGN BODY REMOVAL  09/20/2019   Procedure: FOREIGN BODY REMOVAL;  Surgeon: Napoleon FormNandigam, Kavitha V, MD;  [facility];  Service: Endoscopy;;  Food Impaction   GOLD SEED IMPLANT N/A 04/29/2017   Procedure: GOLD SEED IMPLANT;  Surgeon: Sebastian AcheManny, Theodore, MD;  [facility];  Service: Urology;  Laterality: N/A;   HERNIA REPAIR     INGUINAL HERNIA REPAIR Right 02/23/2014   Procedure: HERNIA REPAIR INGUINAL ADULT;  Surgeon: Clovis Puhomas A. Cornett, MD;  Location: [facility];  Service: General;  Laterality: Right;   INSERTION OF MESH Right 02/23/2014   Procedure: INSERTION OF MESH;  Surgeon: Clovis Puhomas A. Cornett, MD;  Location: [facility];  Service: General;  Laterality: Right;   PERIPHERAL VASCULAR CATHETERIZATION N/A 05/29/2016   Procedure: Lower  Extremity Angiography;  Surgeon: Runell GessJonathan J Berry, MD;  [facility];  Service: Cardiovascular;  Laterality: N/A; [place] orbital rotational atherectomy, PTA and stenting bilateral common iliac arteries    PILONIDAL CYST / SINUS EXCISION  2000   PROSTATE BIOPSY  09-22-2016  dr Berneice Heinrichmanny office   RIGHT/LEFT HEART CATH AND CORONARY ANGIOGRAPHY N/A 01/25/2018   Procedure: RIGHT/LEFT HEART CATH AND CORONARY ANGIOGRAPHY;  Surgeon: Runell GessBerry, Jonathan J, MD;  [facility];  Service: Cardiovascular;  Laterality: N/A;   SAVORY DILATION N/A 10/12/2017   Procedure: SAVORY DILATION;  Surgeon: Benancio DeedsArmbruster, Steven P, MD;  [facility];  Service: Gastroenterology;  Laterality: N/A;   SAVORY DILATION N/A 11/17/2018   Procedure: SAVORY DILATION;  Surgeon: Napoleon FormNandigam, Kavitha V, MD;  [facility];  Service: Endoscopy;  Laterality: N/A;   SPACE OAR INSTILLATION N/A 04/29/2017   Procedure: SPACE OAR INSTILLATION;  Surgeon: Sebastian AcheManny, Theodore, MD;  [facility];  Service: Urology;  Laterality: N/A;   TRANSCATHETER AORTIC VALVE REPLACEMENT, TRANSAPICAL Left 03/16/2018   Procedure: TRANSCATHETER AORTIC VALVE REPLACEMENT, TRANSAPICAL;  Surgeon: Tonny Bollmanooper, Michael, MD;  [facility];  Service: Open Heart Surgery;  Laterality: Left;   TRANSTHORACIC ECHOCARDIOGRAM  05-05-2016   dr berry   ef 55-60%,  grade 1diastolic dysfunction/  moderate AV stenosis with mod. AR (right coronary and noncoronary cusp mobility seversly restricted);  valve area 1.25cm^2, mean gradient 27 mmHg, peak gradient 3354mmHg/  mild MR   UPPER GASTROINTESTINAL ENDOSCOPY     Family History  Problem Relation Age of Onset   Heart attack Mother    Stroke Father    Asthma Father    Cancer Neg Hx    Colon cancer Neg Hx    Stomach cancer Neg Hx    Esophageal cancer Neg Hx    Rectal cancer Neg Hx    Pancreatic cancer Neg Hx    Prostate cancer Neg Hx    Social History    Socioeconomic History   Marital status: Married    Spouse name: Not on file   Number of children: Not on file   Years of education: Not on file   Highest education level: Not on file  Occupational History   Not on file  Tobacco Use   Smoking status: Former Smoker    Packs/day: 1.00    Years: 50.00    Pack years: 50.00    Types: Cigarettes    Quit date: 07/05/2005    Years since quitting: 15.0   Smokeless tobacco: Never Used  Vaping Use   Vaping Use: Never  used  Substance and Sexual Activity   Alcohol use: No   Drug use: Never   Sexual activity: Not Currently    ROS Constitutional: Denies fever, chills, weight loss/gain, headaches, insomnia,  night sweats or change in appetite. Does c/o fatigue. Eyes: Denies redness, blurred vision, diplopia, discharge, itchy or watery eyes.  ENT: Denies discharge, congestion, post nasal drip, epistaxis, sore throat, earache, hearing loss, dental pain, Tinnitus, Vertigo, Sinus pain or snoring.  Cardio: Denies chest pain, palpitations, irregular heartbeat, syncope, dyspnea, diaphoresis, orthopnea, PND, claudication or edema Respiratory: denies cough, dyspnea, DOE, pleurisy, hoarseness, laryngitis or wheezing.  Gastrointestinal: Denies dysphagia, heartburn, reflux, water brash, pain, cramps, nausea, vomiting, bloating, diarrhea, constipation, hematemesis, melena, hematochezia, jaundice or hemorrhoids Genitourinary: Denies dysuria, frequency, urgency, nocturia, hesitancy, discharge, hematuria or flank pain Musculoskeletal: Denies arthralgia, myalgia, stiffness, Jt. Swelling, pain, limp or strain/sprain. Denies Falls. Skin: Denies puritis, rash, hives, warts, acne, eczema or change in skin lesion Neuro: No weakness, tremor, incoordination, spasms, paresthesia or pain Psychiatric: Denies confusion, memory loss or sensory loss. Denies Depression. Endocrine: Denies change in weight, skin, hair change, nocturia, and paresthesia, diabetic  polys, visual blurring or hyper / hypo glycemic episodes.  Heme/Lymph: No excessive bleeding, bruising or enlarged lymph nodes.  Physical Exam  BP 124/72    Pulse 60    Temp (!) 97 F (36.1 C)    Resp 16    Ht 5\' 6"  (1.676 m)    Wt 145 lb 9.6 oz (66 kg)    SpO2 96%    BMI 23.50 kg/m   General Appearance: An Elderly chronically ill appearing WM in no apparent distress.  Eyes: PERRLA, EOMs, conjunctiva no swelling or erythema, normal fundi and vessels. Sinuses: No frontal/maxillary tenderness ENT/Mouth: EACs patent / TMs  nl. Nares clear without erythema, swelling, mucoid exudates. Oral hygiene is good. No erythema, swelling, or exudate. Tongue normal, non-obstructing. Tonsils not swollen or erythematous. Hearing normal.  Neck: Supple, thyroid not palpable. No bruits, nodes or JVD. Respiratory: Respiratory effort normal.  BS equal and clear bilateral without rales, rhonci, wheezing or stridor. Cardio: Heart sounds are soft with regular rate and rhythm and no murmurs, rubs or gallops. Peripheral pulses are normal and equal bilaterally without edema. No aortic or femoral bruits. Chest: symmetric with normal excursions and percussion.  Abdomen: Soft, with Nl bowel sounds. Nontender, no guarding, rebound, hernias, masses, or organomegaly.  Lymphatics: Non tender without lymphadenopathy.  Musculoskeletal:   Generalized decrease in muscle power, tone & bulk. Gait short steppage fenestrating & supported by a rollator. . Skin: Warm and dry without rashes, lesions, cyanosis, clubbing or  ecchymosis.  Neuro: Cranial nerves intact, reflexes equal bilaterally. Normal muscle tone, no cerebellar symptoms. Sensation intact.  Pysch: Alert and oriented x 3 with flat affect. Insight, short term memory and judgment are limited.  Assessment and Plan  1. Labile hypertension  - [place], RETROPERITNL ABD,  LTD - CBC with Differential/Platelet - COMPLETE METABOLIC PANEL WITH GFR - Magnesium - TSH  2.  Hyperlipidemia, mixed  - [place], RETROPERITNL ABD,  LTD - Lipid panel - TSH  3. Abnormal glucose  - [place], RETROPERITNL ABD,  LTD - Hemoglobin A1c - Insulin, random  4. Vitamin D deficiency  - VITAMIN D 25 Hydroxy   5. Hypothyroidism - TSH  6. Atherosclerosis of aorta ([place]) by CTscan in 2019  - [place], RETROPERITNL ABD,  LTD - Lipid panel  7. Paroxysmal atrial fibrillation (HCC)  - Urinalysis, Routine w reflex microscopic - Microalbumin /  creatinine urine ratio - TSH  8. PVD (peripheral vascular disease) (HCC)  - Lipid panel  9. OSA (obstructive sleep apnea)   10. S/P TAVR (transcatheter aortic valve replacement)   11. Screening for colorectal cancer  - POC Hemoccult Bld/Stl (3-Cd Home Screen); Future  12. Screening for ischemic heart disease  - EKG 12-Lead  13. FHx: heart disease  - [place], RETROPERITNL ABD,  LTD  14. Former smoker  - [place], RETROPERITNL ABD,  LTD  15. Screening for AAA (aortic abdominal aneurysm)  - [place], RETROPERITNL ABD,  LTD  16. Medication management  - Urinalysis, Routine w reflex microscopic - Microalbumin / creatinine urine ratio - CBC with Differential/Platelet - COMPLETE METABOLIC PANEL WITH GFR - Magnesium - Lipid panel - TSH - Hemoglobin A1c - Insulin, random - VITAMIN D 25 Hydroxy          Patient was counseled in prudent diet, weight control to achieve/maintain BMI less than 25, BP monitoring, regular exercise and medications as discussed.  Discussed med effects and SE's. Routine screening labs and tests as requested with regular follow-up as recommended. Over 40 minutes of exam, counseling, chart review and high complex critical decision making was performed   Kirtland Bouchard, MD

## 2020-07-29 NOTE — Patient Instructions (Signed)

## 2020-07-30 ENCOUNTER — Ambulatory Visit (INDEPENDENT_AMBULATORY_CARE_PROVIDER_SITE_OTHER): Payer: Medicare Other | Admitting: Internal Medicine

## 2020-07-30 ENCOUNTER — Other Ambulatory Visit: Payer: Self-pay

## 2020-07-30 VITALS — BP 124/72 | HR 60 | Temp 97.0°F | Resp 16 | Ht 66.0 in | Wt 145.6 lb

## 2020-07-30 DIAGNOSIS — R7309 Other abnormal glucose: Secondary | ICD-10-CM

## 2020-07-30 DIAGNOSIS — E039 Hypothyroidism, unspecified: Secondary | ICD-10-CM

## 2020-07-30 DIAGNOSIS — R0989 Other specified symptoms and signs involving the circulatory and respiratory systems: Secondary | ICD-10-CM

## 2020-07-30 DIAGNOSIS — I480 Paroxysmal atrial fibrillation: Secondary | ICD-10-CM

## 2020-07-30 DIAGNOSIS — Z136 Encounter for screening for cardiovascular disorders: Secondary | ICD-10-CM

## 2020-07-30 DIAGNOSIS — E782 Mixed hyperlipidemia: Secondary | ICD-10-CM | POA: Diagnosis not present

## 2020-07-30 DIAGNOSIS — Z8249 Family history of ischemic heart disease and other diseases of the circulatory system: Secondary | ICD-10-CM

## 2020-07-30 DIAGNOSIS — Z1211 Encounter for screening for malignant neoplasm of colon: Secondary | ICD-10-CM

## 2020-07-30 DIAGNOSIS — G4733 Obstructive sleep apnea (adult) (pediatric): Secondary | ICD-10-CM

## 2020-07-30 DIAGNOSIS — I700 Atherosclerosis of aorta: Secondary | ICD-10-CM | POA: Diagnosis not present

## 2020-07-30 DIAGNOSIS — Z87891 Personal history of nicotine dependence: Secondary | ICD-10-CM | POA: Diagnosis not present

## 2020-07-30 DIAGNOSIS — I10 Essential (primary) hypertension: Secondary | ICD-10-CM

## 2020-07-30 DIAGNOSIS — Z952 Presence of prosthetic heart valve: Secondary | ICD-10-CM

## 2020-07-30 DIAGNOSIS — E559 Vitamin D deficiency, unspecified: Secondary | ICD-10-CM

## 2020-07-30 DIAGNOSIS — Z79899 Other long term (current) drug therapy: Secondary | ICD-10-CM | POA: Diagnosis not present

## 2020-07-30 DIAGNOSIS — I739 Peripheral vascular disease, unspecified: Secondary | ICD-10-CM | POA: Diagnosis not present

## 2020-07-31 LAB — COMPLETE METABOLIC PANEL WITH GFR
AG Ratio: 1.8 (calc) (ref 1.0–2.5)
ALT: 9 U/L (ref 9–46)
AST: 14 U/L (ref 10–35)
Albumin: 4.3 g/dL (ref 3.6–5.1)
Alkaline phosphatase (APISO): 74 U/L (ref 35–144)
BUN: 19 mg/dL (ref 7–25)
CO2: 30 mmol/L (ref 20–32)
Calcium: 10.2 mg/dL (ref 8.6–10.3)
Chloride: 106 mmol/L (ref 98–110)
Creat: 1.04 mg/dL (ref 0.70–1.11)
GFR, Est African American: 78 mL/min/{1.73_m2} (ref >=60)
GFR, Est Non African American: 67 mL/min/{1.73_m2} (ref >=60)
Globulin: 2.4 g/dL (calc) (ref 1.9–3.7)
Glucose, Bld: 111 mg/dL — ABNORMAL HIGH (ref 65–99)
Potassium: 4.4 mmol/L (ref 3.5–5.3)
Sodium: 143 mmol/L (ref 135–146)
Total Bilirubin: 0.8 mg/dL (ref 0.2–1.2)
Total Protein: 6.7 g/dL (ref 6.1–8.1)

## 2020-07-31 LAB — URINALYSIS, ROUTINE W REFLEX MICROSCOPIC
Bilirubin Urine: NEGATIVE
Glucose, UA: NEGATIVE
Hgb urine dipstick: NEGATIVE
Ketones, ur: NEGATIVE
Leukocytes,Ua: NEGATIVE
Nitrite: NEGATIVE
Protein, ur: NEGATIVE
Specific Gravity, Urine: 1.02 (ref 1.001–1.03)
pH: 6 (ref 5.0–8.0)

## 2020-07-31 LAB — HEMOGLOBIN A1C
Hgb A1c MFr Bld: 5 % of total Hgb (ref <5.7)
Mean Plasma Glucose: 97 mg/dL
eAG (mmol/L): 5.4 mmol/L

## 2020-07-31 LAB — CBC WITH DIFFERENTIAL/PLATELET
Absolute Monocytes: 350 cells/uL (ref 200–950)
Basophils Absolute: 60 cells/uL (ref 0–200)
Basophils Relative: 1.3 %
Eosinophils Absolute: 161 cells/uL (ref 15–500)
Eosinophils Relative: 3.5 %
HCT: 38.9 % (ref 38.5–50.0)
Hemoglobin: 13.8 g/dL (ref 13.2–17.1)
Lymphs Abs: 874 cells/uL (ref 850–3900)
MCH: 33.2 pg — ABNORMAL HIGH (ref 27.0–33.0)
MCHC: 35.5 g/dL (ref 32.0–36.0)
MCV: 93.5 fL (ref 80.0–100.0)
MPV: 11.2 fL (ref 7.5–12.5)
Monocytes Relative: 7.6 %
Neutro Abs: 3156 cells/uL (ref 1500–7800)
Neutrophils Relative %: 68.6 %
Platelets: 133 10*3/uL — ABNORMAL LOW (ref 140–400)
RBC: 4.16 10*6/uL — ABNORMAL LOW (ref 4.20–5.80)
RDW: 13.7 % (ref 11.0–15.0)
Total Lymphocyte: 19 %
WBC: 4.6 10*3/uL (ref 3.8–10.8)

## 2020-07-31 LAB — INSULIN, RANDOM: Insulin: 13.3 u[IU]/mL

## 2020-07-31 LAB — MICROALBUMIN / CREATININE URINE RATIO
Creatinine, Urine: 131 mg/dL (ref 20–320)
Microalb Creat Ratio: 11 mcg/mg creat (ref <30)
Microalb, Ur: 1.5 mg/dL

## 2020-07-31 LAB — MAGNESIUM: Magnesium: 2.1 mg/dL (ref 1.5–2.5)

## 2020-07-31 LAB — TSH: TSH: 2.9 mIU/L (ref 0.40–4.50)

## 2020-07-31 LAB — LIPID PANEL
Cholesterol: 164 mg/dL (ref <200)
HDL: 52 mg/dL (ref >=40)
LDL Cholesterol (Calc): 92 mg/dL (calc)
Non-HDL Cholesterol (Calc): 112 mg/dL (calc) (ref <130)
Total CHOL/HDL Ratio: 3.2 (calc) (ref <5.0)
Triglycerides: 108 mg/dL (ref <150)

## 2020-07-31 LAB — VITAMIN D 25 HYDROXY (VIT D DEFICIENCY, FRACTURES): Vit D, 25-Hydroxy: 74 ng/mL (ref 30–100)

## 2020-07-31 NOTE — Progress Notes (Signed)
========================================================== -   Test results slightly outside the reference range are not unusual. If there is anything important, I will review this with you,  otherwise it is considered normal test values.  If you have further questions,  please do not hesitate to contact me at the office or via My Chart.  ========================================================== ==========================================================  - Total Chol = 164 and LDL Chol 92 - Both  Excellent   - Very low risk for Heart Attack  / Stroke ========================================================== ==========================================================  - A1c - Normal - Great - No Diabetes ==========================================================  - Vitamin D = 74 - Excellent  ==========================================================  All Else - CBC - Kidneys - Electrolytes - Liver - Magnesium & Thyroid    - all  Normal / OK ===========================================================   - Keep up the [place] Work   ! ==========================================================

## 2020-08-07 ENCOUNTER — Telehealth: Payer: Self-pay | Admitting: Internal Medicine

## 2020-08-07 ENCOUNTER — Telehealth: Payer: Self-pay

## 2020-08-07 NOTE — Telephone Encounter (Signed)
Spoke with patient's daughter [name] and scheduled an in-person Palliative Consult for 08/15/20 @ 2:30PM  COVID screening was negative. No pets in home. Patient lives with wife.  Consent obtained; updated Outlook/Netsmart/Team List and Epic.  Family is aware they will be receiving a call from NP the day before or day of to confirm appointment.

## 2020-08-07 NOTE — Telephone Encounter (Signed)
[name], daughter called to request info on in home hospice care for patient, she states it is becoming increasingly difficult to care for father with advancing Dementia, new issue, patient unable to move legs. Per Dr Melford Aase, recommends [facility] referrral to consult with family on evaluation for admission to Palliative or Hospice care. [name] is very agreeable to evaluation with [facility], referral faxed.

## 2020-08-08 DIAGNOSIS — Z1159 Encounter for screening for other viral diseases: Secondary | ICD-10-CM | POA: Diagnosis not present

## 2020-08-08 DIAGNOSIS — I10 Essential (primary) hypertension: Secondary | ICD-10-CM | POA: Diagnosis not present

## 2020-08-08 DIAGNOSIS — J449 Chronic obstructive pulmonary disease, unspecified: Secondary | ICD-10-CM | POA: Diagnosis not present

## 2020-08-09 DIAGNOSIS — R051 Acute cough: Secondary | ICD-10-CM | POA: Diagnosis not present

## 2020-08-13 DIAGNOSIS — U071 COVID-19: Secondary | ICD-10-CM | POA: Diagnosis not present

## 2020-08-15 ENCOUNTER — Other Ambulatory Visit: Payer: Medicare Other | Admitting: Student

## 2020-08-15 ENCOUNTER — Other Ambulatory Visit: Payer: Self-pay

## 2020-08-15 DIAGNOSIS — Z515 Encounter for palliative care: Secondary | ICD-10-CM

## 2020-08-15 NOTE — Progress Notes (Signed)
[facility] Consult Note Telephone: [phone]  Fax: [phone]  PATIENT NAME: Robert Schaefer [address] [place] [address] [phone] (home) [phone] (work) DOB: 05/25/1941 MRN: [id number]  PRIMARY CARE PROVIDER:    [name], MD,  [address] [place] [address] [phone]  REFERRING PROVIDER:   Unk Schaefer, [place],  [place] [address] [phone]  RESPONSIBLE PARTY:   Extended Emergency Contact Information Primary Emergency Contact: Robert Schaefer Address: [address]          [place], [place] [address] Robert Schaefer Phone: [phone] Relation: Spouse Secondary Emergency Contact: Robert Schaefer, [place] [address] Robert Schaefer of [place] Phone: [phone] Mobile Phone: [phone] Relation: Daughter  Due to the COVID-19 crisis, this home visit was done via telephone due to the patient's inability to connect via an audiovisual connection or their refusal to have an in-person visit. This  connection method was agreed to by the patient/family.     ASSESSMENT AND RECOMMENDATIONS:   Advance Care Planning: Visit at the request of Robert Schaefer for a palliative consult. Visit consisted of building trust and discussions on Palliative care medicine as specialized medical care for people living with serious illness, aimed at facilitating improved quality of life through symptoms relief, assisting with advance care planning and establishing goals of care. Education provided on Palliative care vs. Hospice services. Palliative care will continue to provide support to patient, family and the medical team.  Goal of care: For patient to remain in the home with assistance.  Directives: Living Will.   Symptom Management:   COVID-19-patient on day 10 s/p infection. He is receiving Azithromycin, prednisone, zinc and Vitamin C. Family  monitoring for shortness of breath, changes/declines; family to assist with adl care needs.   Follow up Palliative Care Visit: Palliative care will continue to follow for complex decision making and symptom management. Return in 1 week or prn.  Family /Caregiver/Community Supports: Patient currently has Remote Help coming out to assess regularly due to Covid infection. No other agencies in the home. Family is requesting in home 24/7 care/assistance. They would like for patient and his wife to remain in the home. Referral made to Palliative SW to assist with long-term planning and resources/support.    I spent 30 minutes providing this consultation, time includes time spent with patient/family (daughters Robert Schaefer, son-in law), chart review, provider coordination, and documentation. More than 50% of the time in this consultation was spent counseling and coordinating communication.   CHIEF COMPLAINT: "Needing More Assistance"  History obtained from review of EMR, discussion/ interview with family/caregivers.  Records reviewed and summarized below.  HISTORY OF PRESENT ILLNESS:  Robert Schaefer is a 80 y.o. year old male with multiple medical problems including hypertension, ASCVD, COPD, atrial fibrillation, dementia, hyperlipidemia, diabetes mellitus, hypothyroidism, vitamin D deficiency, GERD, achalasia, hx of prostate CA. Palliative Care was asked to follow this patient by consultation request of Robert Pinto, MD to help address advance care planning and goals of care. This is an initial visit. Mr. Ginger presently lives with his wife and children have been there for the past 10 days as patient and his wife both have Covid. Patient is on day 10 of his infection. Remote help has been coming out regularly to check on patient. He is currently being treated with Azithromycin, prednisone, zinc and vitamin C. Patient has required more assistance  with adl's. He is able to ambulate short distances, but mostly  using wheel chair now. Patient with a fall last week; no injury reported. He is able to feed himself. Appetite poor to fair; family reports a 40-50 pound loss over the past year. He has worsening bowel/bladder incontinence. His speech and communication has declined, varies as sometimes he is able to communicate/speak better. No anxiety or agitation; family reports he does "sundown," he does attempt to get out of bed by himself. Family would like for patient to remain in the home and receive 24/7 care/support if possible.    CODE STATUS: No DNR in place; family to discuss further.   PPS: 40%  HOSPICE ELIGIBILITY/DIAGNOSIS: TBD  ROS:   Weight: family reports 40-50 pound weight loss in past year Cardiovascular: no chest pain reported, no edema  Pulmonary: no cough, increased SOB with exertion, room air GI: appetite fair, incontinent of bowel GU: denies dysuria, incontinent of urine MSK: ambulating short distances, using wheel chair more frequently Skin: no rashes or wounds reported Neurological: Weakness  PAST MEDICAL HISTORY:  Past Medical History:  Diagnosis Date  . AAA (abdominal aortic aneurysm) ([facility])    no longer monitoring was small area  . Achalasia   . Carotid stenosis, asymptomatic, bilateral    per duplex --- bilateral <50% ICA  . COPD (chronic obstructive pulmonary disease) ([facility])    pulmologist-  dr [name]  . Diabetes mellitus without complication (HCC)    diet controlled  . Gait abnormality 12/05/2019  . GERD (gastroesophageal reflux disease)   . Hyperlipidemia   . Hypertension   . Hypothyroidism   . Lesion of left lung    followed by dr [name] (pulmologist)  post infection scarring  . Mild dementia ([facility])   . OSA (obstructive sleep apnea)    has a cpap-  intolerant; due to Claustaphobia (01/25/2018)  . Peripheral vascular disease [facility])    cardiologist-- dr berry-- per last ABI/Aorta/Iliac 06-09-2016  patent bilateral iliac kissing stents, >50% stenosis bilateral iliac  artery (R>L) and left external iliac artery, patent IVC  . Prostate cancer [facility])    urologist-  dr [name] oncologist-  dr [name]. Dx'd 2018 s/p XRT  . RLS (restless legs syndrome)   . S/P TAVR (transcatheter aortic valve replacement) 02/2018   s/p TAVR via transapical approach with an Edwards Sapien 3 THV (size 26 mm, model # 9600TFX, serial # [id number])  . Schatzki's ring   . Severe aortic stenosis   . Wears dentures   . Wears glasses     SOCIAL HX:  Social History   Tobacco Use  . Smoking status: Former Smoker    Packs/day: 1.00    Years: 50.00    Pack years: 50.00    Types: Cigarettes    Quit date: 07/05/2005    Years since quitting: 15.1  . Smokeless tobacco: Never Used  Substance Use Topics  . Alcohol use: No   FAMILY HX:  Family History  Problem Relation Age of Onset  . Heart attack Mother   . Stroke Father   . Asthma Father   . Cancer Neg Hx   . Colon cancer Neg Hx   . Stomach cancer Neg Hx   . Esophageal cancer Neg Hx   . Rectal cancer Neg Hx   . Pancreatic cancer Neg Hx   . Prostate cancer Neg Hx     ALLERGIES:  Allergies  Allergen Reactions  . Penicillins Anaphylaxis, Itching, Swelling and Other (See Comments)  .  Sulfa Antibiotics Anaphylaxis and Swelling  . Versed [Midazolam] Other (See Comments)    Confusion, delerium     PERTINENT MEDICATIONS:  Outpatient Encounter Medications as of 08/15/2020  Medication Sig  . albuterol (VENTOLIN HFA) 108 (90 Base) MCG/ACT inhaler Use 2 inhalations    15 minutes Apart    every 4 hours     if needed to rescue Asthma  . aspirin EC 81 MG tablet Take 81 mg by mouth daily.  . Cholecalciferol (VITAMIN D-3) 5000 units TABS Take 5,000 Units by mouth daily at 3 pm.  . feeding supplement, ENSURE ENLIVE, (ENSURE ENLIVE) LIQD Take 237 mLs by mouth 5 (five) times daily.   [name] levothyroxine (SYNTHROID) 50 MCG tablet TAKE ONE TABLET BY MOUTH BEFORE BREAKFAST  . pantoprazole (PROTONIX) 40 MG tablet Take  1 tablet  2 x/day (every 12  hours)  to Prevent HeartBurn & Indigestion  . QUEtiapine (SEROQUEL) 25 MG tablet Take 1 tablet (25 mg total) by mouth at bedtime.  . sucralfate (CARAFATE) 1 g tablet Take  1 capsule  4 x /day  before Meals & Bedtime   No facility-administered encounter medications on file as of 08/15/2020.     Thank you for the opportunity to participate in the care of Mr. Lundin. The palliative care team will continue to follow. Please call our office at [phone] if we can be of additional assistance.  Ezekiel Slocumb, NP ,  AGPCNP-C

## 2020-08-16 ENCOUNTER — Other Ambulatory Visit: Payer: Self-pay | Admitting: Gastroenterology

## 2020-08-17 ENCOUNTER — Other Ambulatory Visit: Payer: Self-pay | Admitting: Gastroenterology

## 2020-08-21 ENCOUNTER — Other Ambulatory Visit: Payer: Self-pay

## 2020-08-21 ENCOUNTER — Other Ambulatory Visit: Payer: Medicare Other | Admitting: Student

## 2020-08-21 ENCOUNTER — Telehealth: Payer: Self-pay | Admitting: Internal Medicine

## 2020-08-21 ENCOUNTER — Other Ambulatory Visit: Payer: Self-pay | Admitting: Internal Medicine

## 2020-08-21 DIAGNOSIS — Z515 Encounter for palliative care: Secondary | ICD-10-CM | POA: Diagnosis not present

## 2020-08-21 MED ORDER — TRAMADOL HCL 50 MG PO TABS
ORAL_TABLET | ORAL | 0 refills | Status: AC
Start: 2020-08-21 — End: ?

## 2020-08-21 NOTE — Progress Notes (Signed)
[facility] Consult Note Telephone: [phone]  Fax: [phone]  PATIENT NAME: Robert Schaefer [address] [place] [address] [phone] (home) [phone] (work) DOB: 1941-05-04 MRN: [id number]  PRIMARY CARE PROVIDER:    [name], MD,  [address] [place] [address] [phone]  REFERRING PROVIDER:   Unk Schaefer, [place],  [place] [address] [phone]  RESPONSIBLE PARTY:   Extended Emergency Contact Information Primary Emergency Contact: Robert Schaefer Address: [place]          [place], [place] [address] Robert Schaefer of [place] Phone: [phone] Relation: Spouse Secondary Emergency Contact: Robert Schaefer, [place] [address] Robert Schaefer of [place] Phone: [phone] Mobile Phone: [phone] Relation: Daughter  I met face to face with patient and family in the home. Patient present but unable to substantively engage in process.   ASSESSMENT AND RECOMMENDATIONS:   Advance Care Planning: Visit at the request of Dr. Melford Schaefer for palliative consult. Visit consisted of building trust and discussions on Palliative care medicine as specialized medical care for people living with serious illness, aimed at facilitating improved quality of life through symptoms relief, assisting with advance care planning and establishing goals of care. Education provided on Palliative vs. Hospice services. disucssed patient with Medical Director; in agreement with patient being referred for Hospice services with dx of Vascular dementia. Discussed at length goal of care and comfort for patient. Reviewed and filled out MOST form today. Palliative care will continue to provide support to patient, family and the medical team.  Goal of care: To make patient comfortable. Assist with facility placement for higher level of care.  Directives: Daughters have Robert Schaefer,  Living Will. MOST form filled out today.  Symptom Management:   Pain-patient with generalized pain, takes acetaminophen prn. Recommend Acetaminophen 1000mg  po TID routinely, tramadol 25mg  po every 8 hours prn-Dr. Melford Schaefer notified with request.  Behaviors-patient with worsening behaviors; currently taking seroquel 25mg  1/2 tablet (=12.5mg ) QHS. Recommend increasing to 12.5mg  BID.     Follow up Palliative Care Visit: Palliative care will continue to follow for complex decision making and symptom management until patient admitted to Hospice services. . Return as needed.   Family /Caregiver/Community Supports: Remote help has been coming out to check on patient when he had active Covid-19 infection.  Cognitive / Functional decline: Patient now requiring assistance with all adl's. Requires 2 person assist for transfers. Incontinent of bowel and bladder. Patient having more difficulty conveying his needs; care needs anticipated by family. Family is now requesting assistance with facility placement. Palliative SW Robert Schaefer notified and to contact family.  I spent 60 minutes providing this consultation, time includes time spent with patient/family, chart review, provider coordination, and documentation. More than 50% of the time in this consultation was spent counseling and coordinating communication.   CHIEF COMPLAINT: "Need more assistance."  History obtained from review of EMR, discussion with primary team, and  interview with family/caregivers. Records reviewed and summarized below.  HISTORY OF PRESENT ILLNESS:  Robert Schaefer is a 80 y.o. year old male with multiple medical problems including including hypertension, ASCVD, COPD, atrial fibrillation, dementia, hyperlipidemia, diabetes mellitus, hypothyroidism, vitamin D deficiency, GERD, achalasia, abnormal weight loss, hx of prostate CA. Palliative Care was asked to follow this patient by consultation request of Robert Pinto, MD to help address  advance care planning and goals of care. This is a follow up visit.  Robert Schaefer currently resides with his wife of 40 years. He was treated for Covid 19 infection in the past two weeks. Daughters Robert Schaefer and Robert Schaefer have been in the home for the past three weeks to help care for patient. He has had difficulty ambulating for the past year, able to take a few steps, now unable to ambulate and requires assist x 2 to transfer from bed to wheel chair in past 3 weeks. He has worsening bowel and bladder incontinence. His speech and communication has declined, waxes and wanes. He will yell out. Family report "sun downing" and worsening behaviors; patient is yelling out more. Hallucinations reported in the past. CT scan on 10/04/19 resultsareas of decreased attenuation within the white matter tracts of the supratentorial brain, consistent with microvasculardisease changes. Generalized cerebral atrophy .Findings consistent with suspected vascular dementia. He attempts to get up unassisted. He has had 3 falls in the past two weeks. Appetite is usually poor, eating 25% or less of meals; he is able to feed himself. He does have orders for nutritional supplement 5 times a day, but only drinks 1/2 supplement daily per family. Family reports a 40-50 pound weight loss over the past year. Family is now requesting assistance with placement as wife is unable to care for patient alone and daughters will be returning home and work soon.    CODE STATUS: DNR  PPS: 30%  HOSPICE ELIGIBILITY/DIAGNOSIS: Vascular dementia, abnormal weight loss.  ROS   General: NAD EYES: no vision changes ENMT: denies dysphagia Cardiovascular: denies chest pain Pulmonary: occasional productive cough, increased SOB with exertion Abdomen: endorses poor appetite, incontinence of bowel GU: denies dysuria, endorses incontinence of urine MSK:  endorses ROM limitations, multiple falls reported Skin: denies rashes or wounds Neurological: endorses  weakness, sleep difficulty reported  Psych: agitation at times Heme/lymph/immuno: denies bruises, abnormal bleeding   Physical Exam: 145 pounds-07/30/20, BMI 23.5 Constitutional: NAD, res ting in bed; alert oriented to person, recognizes wife only General: frail appearing, thin, chronically ill appearing EYES: anicteric sclera, lids intact, no discharge  ENMT: intact hearing,oral mucous membranes moist CV:  RRR, trace edema to BLE Pulmonary: Upper lobes clear, fine crackles to bases, no increased work of breathing, occasional np cough, no audible wheezes, room air Abdomen: bowel sounds normoactive x 4 GU: deferred MSK: sarcopenia, no contractures of LE, non ambulatory Skin: warm and dry, no rashes or wounds on visible skin Neuro: Generalized weakness, severe cognitive impairment Psych: non-anxious affect today,  Hem/lymph/immuno: no widespread bruising   PAST MEDICAL HISTORY:  Past Medical History:  Diagnosis Date  . AAA (abdominal aortic aneurysm) ([place])    no longer monitoring was small area  . Achalasia   . Carotid stenosis, asymptomatic, bilateral    per duplex --- bilateral <50% ICA  . COPD (chronic obstructive pulmonary disease) ([facility])    pulmologist-  dr [name]  . Diabetes mellitus without complication (HCC)    diet controlled  . Gait abnormality 12/05/2019  . GERD (gastroesophageal reflux disease)   . Hyperlipidemia   . Hypertension   . Hypothyroidism   . Lesion of left lung    followed by dr [name] (pulmologist)  post infection scarring  . Mild dementia ([place] [facility])   . OSA (obstructive sleep apnea)    has a cpap-  intolerant; due to Claustaphobia (01/25/2018)  . Peripheral vascular disease [facility])    cardiologist-- dr berry-- per last ABI/Aorta/Iliac 06-09-2016  patent bilateral iliac kissing stents, >50% stenosis bilateral iliac artery (R>L) and left external iliac  artery, patent IVC  . Prostate cancer [facility])    urologist-  dr [name] oncologist-  dr [name]. Dx'd 2018 s/p XRT  .  RLS (restless legs syndrome)   . S/P TAVR (transcatheter aortic valve replacement) 02/2018   s/p TAVR via transapical approach with an Edwards Sapien 3 THV (size 26 mm, model # 9600TFX, serial # [id number])  . Schatzki's ring   . Severe aortic stenosis   . Wears dentures   . Wears glasses     SOCIAL HX:  Social History   Tobacco Use  . Smoking status: Former Smoker    Packs/day: 1.00    Years: 50.00    Pack years: 50.00    Types: Cigarettes    Quit date: 07/05/2005    Years since quitting: 15.1  . Smokeless tobacco: Never Used  Substance Use Topics  . Alcohol use: No   FAMILY HX:  Family History  Problem Relation Age of Onset  . Heart attack Mother   . Stroke Father   . Asthma Father   . Cancer Neg Hx   . Colon cancer Neg Hx   . Stomach cancer Neg Hx   . Esophageal cancer Neg Hx   . Rectal cancer Neg Hx   . Pancreatic cancer Neg Hx   . Prostate cancer Neg Hx     ALLERGIES:  Allergies  Allergen Reactions  . Penicillins Anaphylaxis, Itching, Swelling and Other (See Comments)  . Sulfa Antibiotics Anaphylaxis and Swelling  . Versed [Midazolam] Other (See Comments)    Confusion, delerium     PERTINENT MEDICATIONS:  Outpatient Encounter Medications as of 08/21/2020  Medication Sig  . albuterol (VENTOLIN HFA) 108 (90 Base) MCG/ACT inhaler Use 2 inhalations    15 minutes Apart    every 4 hours     if needed to rescue Asthma  . aspirin EC 81 MG tablet Take 81 mg by mouth daily.  . Cholecalciferol (VITAMIN D-3) 5000 units TABS Take 5,000 Units by mouth daily at 3 pm.  . feeding supplement, ENSURE ENLIVE, (ENSURE ENLIVE) LIQD Take 237 mLs by mouth 5 (five) times daily.   [name] levothyroxine (SYNTHROID) 50 MCG tablet TAKE ONE TABLET BY MOUTH BEFORE BREAKFAST  . pantoprazole (PROTONIX) 40 MG tablet Take  1 tablet  2 x/day (every 12 hours)  to Prevent HeartBurn & Indigestion  . QUEtiapine (SEROQUEL) 25 MG tablet Take 1 tablet (25 mg total) by mouth at bedtime.  . sucralfate (CARAFATE)  1 g tablet TAKE ONE TABLET BY MOUTH FOUR TIMES DAILY   No facility-administered encounter medications on file as of 08/21/2020.     Thank you for the opportunity to participate in the care of Mr. Anderle. The palliative care team will continue to follow. Please call our office at [phone] if we can be of additional assistance.  Ezekiel Slocumb, NP , AGPCNP-C

## 2020-08-21 NOTE — Telephone Encounter (Signed)
Per Dr [name], I called Phineas Real w/ [facility] giving verbal order to evaluate and admit to Hospice care. Dr Melford Aase will call in Tramadol as requested by [facility] Palliative. Our office notified patient's family. All DME will be handled by hospice.

## 2020-08-22 ENCOUNTER — Ambulatory Visit: Payer: Medicare Other | Admitting: Neurology

## 2020-08-23 DIAGNOSIS — F0280 Dementia in other diseases classified elsewhere without behavioral disturbance: Secondary | ICD-10-CM | POA: Diagnosis not present

## 2020-08-23 DIAGNOSIS — Z8616 Personal history of COVID-19: Secondary | ICD-10-CM | POA: Diagnosis not present

## 2020-08-23 DIAGNOSIS — J449 Chronic obstructive pulmonary disease, unspecified: Secondary | ICD-10-CM | POA: Diagnosis not present

## 2020-08-23 DIAGNOSIS — Z6823 Body mass index (BMI) 23.0-23.9, adult: Secondary | ICD-10-CM | POA: Diagnosis not present

## 2020-08-23 DIAGNOSIS — G309 Alzheimer's disease, unspecified: Secondary | ICD-10-CM | POA: Diagnosis not present

## 2020-08-23 DIAGNOSIS — K222 Esophageal obstruction: Secondary | ICD-10-CM | POA: Diagnosis not present

## 2020-08-23 DIAGNOSIS — K219 Gastro-esophageal reflux disease without esophagitis: Secondary | ICD-10-CM | POA: Diagnosis not present

## 2020-08-23 DIAGNOSIS — M549 Dorsalgia, unspecified: Secondary | ICD-10-CM | POA: Diagnosis not present

## 2020-08-24 DIAGNOSIS — G309 Alzheimer's disease, unspecified: Secondary | ICD-10-CM | POA: Diagnosis not present

## 2020-08-24 DIAGNOSIS — K222 Esophageal obstruction: Secondary | ICD-10-CM | POA: Diagnosis not present

## 2020-08-24 DIAGNOSIS — F0280 Dementia in other diseases classified elsewhere without behavioral disturbance: Secondary | ICD-10-CM | POA: Diagnosis not present

## 2020-08-24 DIAGNOSIS — J449 Chronic obstructive pulmonary disease, unspecified: Secondary | ICD-10-CM | POA: Diagnosis not present

## 2020-08-24 DIAGNOSIS — K219 Gastro-esophageal reflux disease without esophagitis: Secondary | ICD-10-CM | POA: Diagnosis not present

## 2020-08-24 DIAGNOSIS — M549 Dorsalgia, unspecified: Secondary | ICD-10-CM | POA: Diagnosis not present

## 2020-08-25 DIAGNOSIS — F0280 Dementia in other diseases classified elsewhere without behavioral disturbance: Secondary | ICD-10-CM | POA: Diagnosis not present

## 2020-08-25 DIAGNOSIS — J449 Chronic obstructive pulmonary disease, unspecified: Secondary | ICD-10-CM | POA: Diagnosis not present

## 2020-08-25 DIAGNOSIS — G309 Alzheimer's disease, unspecified: Secondary | ICD-10-CM | POA: Diagnosis not present

## 2020-08-25 DIAGNOSIS — M549 Dorsalgia, unspecified: Secondary | ICD-10-CM | POA: Diagnosis not present

## 2020-08-25 DIAGNOSIS — K219 Gastro-esophageal reflux disease without esophagitis: Secondary | ICD-10-CM | POA: Diagnosis not present

## 2020-08-25 DIAGNOSIS — K222 Esophageal obstruction: Secondary | ICD-10-CM | POA: Diagnosis not present

## 2020-08-27 DIAGNOSIS — M549 Dorsalgia, unspecified: Secondary | ICD-10-CM | POA: Diagnosis not present

## 2020-08-27 DIAGNOSIS — K219 Gastro-esophageal reflux disease without esophagitis: Secondary | ICD-10-CM | POA: Diagnosis not present

## 2020-08-27 DIAGNOSIS — G309 Alzheimer's disease, unspecified: Secondary | ICD-10-CM | POA: Diagnosis not present

## 2020-08-27 DIAGNOSIS — K222 Esophageal obstruction: Secondary | ICD-10-CM | POA: Diagnosis not present

## 2020-08-27 DIAGNOSIS — F0280 Dementia in other diseases classified elsewhere without behavioral disturbance: Secondary | ICD-10-CM | POA: Diagnosis not present

## 2020-08-27 DIAGNOSIS — J449 Chronic obstructive pulmonary disease, unspecified: Secondary | ICD-10-CM | POA: Diagnosis not present

## 2020-08-28 DIAGNOSIS — K219 Gastro-esophageal reflux disease without esophagitis: Secondary | ICD-10-CM | POA: Diagnosis not present

## 2020-08-28 DIAGNOSIS — Z8616 Personal history of COVID-19: Secondary | ICD-10-CM | POA: Diagnosis not present

## 2020-08-28 DIAGNOSIS — G309 Alzheimer's disease, unspecified: Secondary | ICD-10-CM | POA: Diagnosis not present

## 2020-08-28 DIAGNOSIS — J449 Chronic obstructive pulmonary disease, unspecified: Secondary | ICD-10-CM | POA: Diagnosis not present

## 2020-08-28 DIAGNOSIS — K222 Esophageal obstruction: Secondary | ICD-10-CM | POA: Diagnosis not present

## 2020-08-28 DIAGNOSIS — M549 Dorsalgia, unspecified: Secondary | ICD-10-CM | POA: Diagnosis not present

## 2020-08-28 DIAGNOSIS — Z6823 Body mass index (BMI) 23.0-23.9, adult: Secondary | ICD-10-CM | POA: Diagnosis not present

## 2020-08-28 DIAGNOSIS — F0280 Dementia in other diseases classified elsewhere without behavioral disturbance: Secondary | ICD-10-CM | POA: Diagnosis not present

## 2020-08-29 DIAGNOSIS — K219 Gastro-esophageal reflux disease without esophagitis: Secondary | ICD-10-CM | POA: Diagnosis not present

## 2020-08-29 DIAGNOSIS — M549 Dorsalgia, unspecified: Secondary | ICD-10-CM | POA: Diagnosis not present

## 2020-08-29 DIAGNOSIS — J449 Chronic obstructive pulmonary disease, unspecified: Secondary | ICD-10-CM | POA: Diagnosis not present

## 2020-08-29 DIAGNOSIS — F0280 Dementia in other diseases classified elsewhere without behavioral disturbance: Secondary | ICD-10-CM | POA: Diagnosis not present

## 2020-08-29 DIAGNOSIS — G309 Alzheimer's disease, unspecified: Secondary | ICD-10-CM | POA: Diagnosis not present

## 2020-08-29 DIAGNOSIS — K222 Esophageal obstruction: Secondary | ICD-10-CM | POA: Diagnosis not present

## 2020-08-30 DIAGNOSIS — F0280 Dementia in other diseases classified elsewhere without behavioral disturbance: Secondary | ICD-10-CM | POA: Diagnosis not present

## 2020-08-30 DIAGNOSIS — J449 Chronic obstructive pulmonary disease, unspecified: Secondary | ICD-10-CM | POA: Diagnosis not present

## 2020-08-30 DIAGNOSIS — K222 Esophageal obstruction: Secondary | ICD-10-CM | POA: Diagnosis not present

## 2020-08-30 DIAGNOSIS — G309 Alzheimer's disease, unspecified: Secondary | ICD-10-CM | POA: Diagnosis not present

## 2020-08-30 DIAGNOSIS — M549 Dorsalgia, unspecified: Secondary | ICD-10-CM | POA: Diagnosis not present

## 2020-08-30 DIAGNOSIS — K219 Gastro-esophageal reflux disease without esophagitis: Secondary | ICD-10-CM | POA: Diagnosis not present

## 2020-08-31 DIAGNOSIS — K222 Esophageal obstruction: Secondary | ICD-10-CM | POA: Diagnosis not present

## 2020-08-31 DIAGNOSIS — M549 Dorsalgia, unspecified: Secondary | ICD-10-CM | POA: Diagnosis not present

## 2020-08-31 DIAGNOSIS — K219 Gastro-esophageal reflux disease without esophagitis: Secondary | ICD-10-CM | POA: Diagnosis not present

## 2020-08-31 DIAGNOSIS — F0280 Dementia in other diseases classified elsewhere without behavioral disturbance: Secondary | ICD-10-CM | POA: Diagnosis not present

## 2020-08-31 DIAGNOSIS — J449 Chronic obstructive pulmonary disease, unspecified: Secondary | ICD-10-CM | POA: Diagnosis not present

## 2020-08-31 DIAGNOSIS — G309 Alzheimer's disease, unspecified: Secondary | ICD-10-CM | POA: Diagnosis not present

## 2020-09-03 DIAGNOSIS — M549 Dorsalgia, unspecified: Secondary | ICD-10-CM | POA: Diagnosis not present

## 2020-09-03 DIAGNOSIS — J449 Chronic obstructive pulmonary disease, unspecified: Secondary | ICD-10-CM | POA: Diagnosis not present

## 2020-09-03 DIAGNOSIS — F0280 Dementia in other diseases classified elsewhere without behavioral disturbance: Secondary | ICD-10-CM | POA: Diagnosis not present

## 2020-09-03 DIAGNOSIS — K219 Gastro-esophageal reflux disease without esophagitis: Secondary | ICD-10-CM | POA: Diagnosis not present

## 2020-09-03 DIAGNOSIS — K222 Esophageal obstruction: Secondary | ICD-10-CM | POA: Diagnosis not present

## 2020-09-03 DIAGNOSIS — G309 Alzheimer's disease, unspecified: Secondary | ICD-10-CM | POA: Diagnosis not present

## 2020-09-04 DIAGNOSIS — K222 Esophageal obstruction: Secondary | ICD-10-CM | POA: Diagnosis not present

## 2020-09-04 DIAGNOSIS — K219 Gastro-esophageal reflux disease without esophagitis: Secondary | ICD-10-CM | POA: Diagnosis not present

## 2020-09-04 DIAGNOSIS — J449 Chronic obstructive pulmonary disease, unspecified: Secondary | ICD-10-CM | POA: Diagnosis not present

## 2020-09-04 DIAGNOSIS — F0280 Dementia in other diseases classified elsewhere without behavioral disturbance: Secondary | ICD-10-CM | POA: Diagnosis not present

## 2020-09-04 DIAGNOSIS — G309 Alzheimer's disease, unspecified: Secondary | ICD-10-CM | POA: Diagnosis not present

## 2020-09-04 DIAGNOSIS — M549 Dorsalgia, unspecified: Secondary | ICD-10-CM | POA: Diagnosis not present

## 2020-09-05 ENCOUNTER — Telehealth: Payer: Self-pay

## 2020-09-05 DIAGNOSIS — G309 Alzheimer's disease, unspecified: Secondary | ICD-10-CM | POA: Diagnosis not present

## 2020-09-05 DIAGNOSIS — K222 Esophageal obstruction: Secondary | ICD-10-CM | POA: Diagnosis not present

## 2020-09-05 DIAGNOSIS — M549 Dorsalgia, unspecified: Secondary | ICD-10-CM | POA: Diagnosis not present

## 2020-09-05 DIAGNOSIS — J449 Chronic obstructive pulmonary disease, unspecified: Secondary | ICD-10-CM | POA: Diagnosis not present

## 2020-09-05 DIAGNOSIS — K219 Gastro-esophageal reflux disease without esophagitis: Secondary | ICD-10-CM | POA: Diagnosis not present

## 2020-09-05 DIAGNOSIS — F0280 Dementia in other diseases classified elsewhere without behavioral disturbance: Secondary | ICD-10-CM | POA: Diagnosis not present

## 2020-09-05 NOTE — Telephone Encounter (Signed)
(  3:29p) Palliative care SW completed a follow-up with patient's daughter-Kathy to provide support and resources. [name] advised that her dad is now receiving hospice through [facility]. She is happy with his hospice team and care provided. SW advised that she was happy to hear that patient is getting care. SW encouraged her to reach out to her team for any concerns.

## 2020-09-07 DIAGNOSIS — M549 Dorsalgia, unspecified: Secondary | ICD-10-CM | POA: Diagnosis not present

## 2020-09-07 DIAGNOSIS — G309 Alzheimer's disease, unspecified: Secondary | ICD-10-CM | POA: Diagnosis not present

## 2020-09-07 DIAGNOSIS — F0280 Dementia in other diseases classified elsewhere without behavioral disturbance: Secondary | ICD-10-CM | POA: Diagnosis not present

## 2020-09-07 DIAGNOSIS — K222 Esophageal obstruction: Secondary | ICD-10-CM | POA: Diagnosis not present

## 2020-09-07 DIAGNOSIS — K219 Gastro-esophageal reflux disease without esophagitis: Secondary | ICD-10-CM | POA: Diagnosis not present

## 2020-09-07 DIAGNOSIS — J449 Chronic obstructive pulmonary disease, unspecified: Secondary | ICD-10-CM | POA: Diagnosis not present

## 2020-09-08 DIAGNOSIS — J449 Chronic obstructive pulmonary disease, unspecified: Secondary | ICD-10-CM | POA: Diagnosis not present

## 2020-09-08 DIAGNOSIS — F0280 Dementia in other diseases classified elsewhere without behavioral disturbance: Secondary | ICD-10-CM | POA: Diagnosis not present

## 2020-09-08 DIAGNOSIS — K219 Gastro-esophageal reflux disease without esophagitis: Secondary | ICD-10-CM | POA: Diagnosis not present

## 2020-09-08 DIAGNOSIS — K222 Esophageal obstruction: Secondary | ICD-10-CM | POA: Diagnosis not present

## 2020-09-08 DIAGNOSIS — M549 Dorsalgia, unspecified: Secondary | ICD-10-CM | POA: Diagnosis not present

## 2020-09-08 DIAGNOSIS — G309 Alzheimer's disease, unspecified: Secondary | ICD-10-CM | POA: Diagnosis not present

## 2020-09-09 ENCOUNTER — Other Ambulatory Visit: Payer: Self-pay | Admitting: Adult Health

## 2020-09-09 DIAGNOSIS — E039 Hypothyroidism, unspecified: Secondary | ICD-10-CM

## 2020-09-10 DIAGNOSIS — K219 Gastro-esophageal reflux disease without esophagitis: Secondary | ICD-10-CM | POA: Diagnosis not present

## 2020-09-10 DIAGNOSIS — G309 Alzheimer's disease, unspecified: Secondary | ICD-10-CM | POA: Diagnosis not present

## 2020-09-10 DIAGNOSIS — M549 Dorsalgia, unspecified: Secondary | ICD-10-CM | POA: Diagnosis not present

## 2020-09-10 DIAGNOSIS — F0280 Dementia in other diseases classified elsewhere without behavioral disturbance: Secondary | ICD-10-CM | POA: Diagnosis not present

## 2020-09-10 DIAGNOSIS — J449 Chronic obstructive pulmonary disease, unspecified: Secondary | ICD-10-CM | POA: Diagnosis not present

## 2020-09-10 DIAGNOSIS — K222 Esophageal obstruction: Secondary | ICD-10-CM | POA: Diagnosis not present

## 2020-09-11 ENCOUNTER — Other Ambulatory Visit: Payer: Self-pay | Admitting: Gastroenterology

## 2020-09-11 ENCOUNTER — Other Ambulatory Visit: Payer: Self-pay | Admitting: Adult Health

## 2020-09-11 DIAGNOSIS — K222 Esophageal obstruction: Secondary | ICD-10-CM | POA: Diagnosis not present

## 2020-09-11 DIAGNOSIS — F0280 Dementia in other diseases classified elsewhere without behavioral disturbance: Secondary | ICD-10-CM | POA: Diagnosis not present

## 2020-09-11 DIAGNOSIS — E039 Hypothyroidism, unspecified: Secondary | ICD-10-CM

## 2020-09-11 DIAGNOSIS — J449 Chronic obstructive pulmonary disease, unspecified: Secondary | ICD-10-CM | POA: Diagnosis not present

## 2020-09-11 DIAGNOSIS — K219 Gastro-esophageal reflux disease without esophagitis: Secondary | ICD-10-CM | POA: Diagnosis not present

## 2020-09-11 DIAGNOSIS — G309 Alzheimer's disease, unspecified: Secondary | ICD-10-CM | POA: Diagnosis not present

## 2020-09-11 DIAGNOSIS — M549 Dorsalgia, unspecified: Secondary | ICD-10-CM | POA: Diagnosis not present

## 2020-09-12 DIAGNOSIS — M549 Dorsalgia, unspecified: Secondary | ICD-10-CM | POA: Diagnosis not present

## 2020-09-12 DIAGNOSIS — F0280 Dementia in other diseases classified elsewhere without behavioral disturbance: Secondary | ICD-10-CM | POA: Diagnosis not present

## 2020-09-12 DIAGNOSIS — J449 Chronic obstructive pulmonary disease, unspecified: Secondary | ICD-10-CM | POA: Diagnosis not present

## 2020-09-12 DIAGNOSIS — K222 Esophageal obstruction: Secondary | ICD-10-CM | POA: Diagnosis not present

## 2020-09-12 DIAGNOSIS — K219 Gastro-esophageal reflux disease without esophagitis: Secondary | ICD-10-CM | POA: Diagnosis not present

## 2020-09-12 DIAGNOSIS — G309 Alzheimer's disease, unspecified: Secondary | ICD-10-CM | POA: Diagnosis not present

## 2020-09-13 DIAGNOSIS — K219 Gastro-esophageal reflux disease without esophagitis: Secondary | ICD-10-CM | POA: Diagnosis not present

## 2020-09-13 DIAGNOSIS — J449 Chronic obstructive pulmonary disease, unspecified: Secondary | ICD-10-CM | POA: Diagnosis not present

## 2020-09-13 DIAGNOSIS — G309 Alzheimer's disease, unspecified: Secondary | ICD-10-CM | POA: Diagnosis not present

## 2020-09-13 DIAGNOSIS — K222 Esophageal obstruction: Secondary | ICD-10-CM | POA: Diagnosis not present

## 2020-09-13 DIAGNOSIS — M549 Dorsalgia, unspecified: Secondary | ICD-10-CM | POA: Diagnosis not present

## 2020-09-13 DIAGNOSIS — F0280 Dementia in other diseases classified elsewhere without behavioral disturbance: Secondary | ICD-10-CM | POA: Diagnosis not present

## 2020-09-14 DIAGNOSIS — K219 Gastro-esophageal reflux disease without esophagitis: Secondary | ICD-10-CM | POA: Diagnosis not present

## 2020-09-14 DIAGNOSIS — F0280 Dementia in other diseases classified elsewhere without behavioral disturbance: Secondary | ICD-10-CM | POA: Diagnosis not present

## 2020-09-14 DIAGNOSIS — K222 Esophageal obstruction: Secondary | ICD-10-CM | POA: Diagnosis not present

## 2020-09-14 DIAGNOSIS — G309 Alzheimer's disease, unspecified: Secondary | ICD-10-CM | POA: Diagnosis not present

## 2020-09-14 DIAGNOSIS — M549 Dorsalgia, unspecified: Secondary | ICD-10-CM | POA: Diagnosis not present

## 2020-09-14 DIAGNOSIS — J449 Chronic obstructive pulmonary disease, unspecified: Secondary | ICD-10-CM | POA: Diagnosis not present

## 2020-09-16 DIAGNOSIS — K222 Esophageal obstruction: Secondary | ICD-10-CM | POA: Diagnosis not present

## 2020-09-16 DIAGNOSIS — J449 Chronic obstructive pulmonary disease, unspecified: Secondary | ICD-10-CM | POA: Diagnosis not present

## 2020-09-16 DIAGNOSIS — F0280 Dementia in other diseases classified elsewhere without behavioral disturbance: Secondary | ICD-10-CM | POA: Diagnosis not present

## 2020-09-16 DIAGNOSIS — K219 Gastro-esophageal reflux disease without esophagitis: Secondary | ICD-10-CM | POA: Diagnosis not present

## 2020-09-16 DIAGNOSIS — M549 Dorsalgia, unspecified: Secondary | ICD-10-CM | POA: Diagnosis not present

## 2020-09-16 DIAGNOSIS — G309 Alzheimer's disease, unspecified: Secondary | ICD-10-CM | POA: Diagnosis not present

## 2020-09-17 DIAGNOSIS — F0280 Dementia in other diseases classified elsewhere without behavioral disturbance: Secondary | ICD-10-CM | POA: Diagnosis not present

## 2020-09-17 DIAGNOSIS — K219 Gastro-esophageal reflux disease without esophagitis: Secondary | ICD-10-CM | POA: Diagnosis not present

## 2020-09-17 DIAGNOSIS — M549 Dorsalgia, unspecified: Secondary | ICD-10-CM | POA: Diagnosis not present

## 2020-09-17 DIAGNOSIS — G309 Alzheimer's disease, unspecified: Secondary | ICD-10-CM | POA: Diagnosis not present

## 2020-09-17 DIAGNOSIS — K222 Esophageal obstruction: Secondary | ICD-10-CM | POA: Diagnosis not present

## 2020-09-17 DIAGNOSIS — J449 Chronic obstructive pulmonary disease, unspecified: Secondary | ICD-10-CM | POA: Diagnosis not present

## 2020-09-18 DIAGNOSIS — G309 Alzheimer's disease, unspecified: Secondary | ICD-10-CM | POA: Diagnosis not present

## 2020-09-18 DIAGNOSIS — K222 Esophageal obstruction: Secondary | ICD-10-CM | POA: Diagnosis not present

## 2020-09-18 DIAGNOSIS — F0280 Dementia in other diseases classified elsewhere without behavioral disturbance: Secondary | ICD-10-CM | POA: Diagnosis not present

## 2020-09-18 DIAGNOSIS — J449 Chronic obstructive pulmonary disease, unspecified: Secondary | ICD-10-CM | POA: Diagnosis not present

## 2020-09-18 DIAGNOSIS — K219 Gastro-esophageal reflux disease without esophagitis: Secondary | ICD-10-CM | POA: Diagnosis not present

## 2020-09-18 DIAGNOSIS — M549 Dorsalgia, unspecified: Secondary | ICD-10-CM | POA: Diagnosis not present

## 2020-09-19 DIAGNOSIS — J449 Chronic obstructive pulmonary disease, unspecified: Secondary | ICD-10-CM | POA: Diagnosis not present

## 2020-09-19 DIAGNOSIS — K222 Esophageal obstruction: Secondary | ICD-10-CM | POA: Diagnosis not present

## 2020-09-19 DIAGNOSIS — M549 Dorsalgia, unspecified: Secondary | ICD-10-CM | POA: Diagnosis not present

## 2020-09-19 DIAGNOSIS — K219 Gastro-esophageal reflux disease without esophagitis: Secondary | ICD-10-CM | POA: Diagnosis not present

## 2020-09-19 DIAGNOSIS — F0280 Dementia in other diseases classified elsewhere without behavioral disturbance: Secondary | ICD-10-CM | POA: Diagnosis not present

## 2020-09-19 DIAGNOSIS — G309 Alzheimer's disease, unspecified: Secondary | ICD-10-CM | POA: Diagnosis not present

## 2020-09-20 DIAGNOSIS — F0280 Dementia in other diseases classified elsewhere without behavioral disturbance: Secondary | ICD-10-CM | POA: Diagnosis not present

## 2020-09-20 DIAGNOSIS — J449 Chronic obstructive pulmonary disease, unspecified: Secondary | ICD-10-CM | POA: Diagnosis not present

## 2020-09-20 DIAGNOSIS — G309 Alzheimer's disease, unspecified: Secondary | ICD-10-CM | POA: Diagnosis not present

## 2020-09-20 DIAGNOSIS — M549 Dorsalgia, unspecified: Secondary | ICD-10-CM | POA: Diagnosis not present

## 2020-09-20 DIAGNOSIS — K219 Gastro-esophageal reflux disease without esophagitis: Secondary | ICD-10-CM | POA: Diagnosis not present

## 2020-09-20 DIAGNOSIS — K222 Esophageal obstruction: Secondary | ICD-10-CM | POA: Diagnosis not present

## 2020-09-21 DIAGNOSIS — J449 Chronic obstructive pulmonary disease, unspecified: Secondary | ICD-10-CM | POA: Diagnosis not present

## 2020-09-21 DIAGNOSIS — F0280 Dementia in other diseases classified elsewhere without behavioral disturbance: Secondary | ICD-10-CM | POA: Diagnosis not present

## 2020-09-21 DIAGNOSIS — G309 Alzheimer's disease, unspecified: Secondary | ICD-10-CM | POA: Diagnosis not present

## 2020-09-21 DIAGNOSIS — K219 Gastro-esophageal reflux disease without esophagitis: Secondary | ICD-10-CM | POA: Diagnosis not present

## 2020-09-21 DIAGNOSIS — K222 Esophageal obstruction: Secondary | ICD-10-CM | POA: Diagnosis not present

## 2020-09-21 DIAGNOSIS — M549 Dorsalgia, unspecified: Secondary | ICD-10-CM | POA: Diagnosis not present

## 2020-09-22 DIAGNOSIS — K219 Gastro-esophageal reflux disease without esophagitis: Secondary | ICD-10-CM | POA: Diagnosis not present

## 2020-09-22 DIAGNOSIS — F0280 Dementia in other diseases classified elsewhere without behavioral disturbance: Secondary | ICD-10-CM | POA: Diagnosis not present

## 2020-09-22 DIAGNOSIS — K222 Esophageal obstruction: Secondary | ICD-10-CM | POA: Diagnosis not present

## 2020-09-22 DIAGNOSIS — J449 Chronic obstructive pulmonary disease, unspecified: Secondary | ICD-10-CM | POA: Diagnosis not present

## 2020-09-22 DIAGNOSIS — M549 Dorsalgia, unspecified: Secondary | ICD-10-CM | POA: Diagnosis not present

## 2020-09-22 DIAGNOSIS — G309 Alzheimer's disease, unspecified: Secondary | ICD-10-CM | POA: Diagnosis not present

## 2020-09-23 DIAGNOSIS — F0280 Dementia in other diseases classified elsewhere without behavioral disturbance: Secondary | ICD-10-CM | POA: Diagnosis not present

## 2020-09-23 DIAGNOSIS — J449 Chronic obstructive pulmonary disease, unspecified: Secondary | ICD-10-CM | POA: Diagnosis not present

## 2020-09-23 DIAGNOSIS — M549 Dorsalgia, unspecified: Secondary | ICD-10-CM | POA: Diagnosis not present

## 2020-09-23 DIAGNOSIS — K222 Esophageal obstruction: Secondary | ICD-10-CM | POA: Diagnosis not present

## 2020-09-23 DIAGNOSIS — K219 Gastro-esophageal reflux disease without esophagitis: Secondary | ICD-10-CM | POA: Diagnosis not present

## 2020-09-23 DIAGNOSIS — G309 Alzheimer's disease, unspecified: Secondary | ICD-10-CM | POA: Diagnosis not present

## 2020-09-24 DIAGNOSIS — G309 Alzheimer's disease, unspecified: Secondary | ICD-10-CM | POA: Diagnosis not present

## 2020-09-24 DIAGNOSIS — F0280 Dementia in other diseases classified elsewhere without behavioral disturbance: Secondary | ICD-10-CM | POA: Diagnosis not present

## 2020-09-24 DIAGNOSIS — M549 Dorsalgia, unspecified: Secondary | ICD-10-CM | POA: Diagnosis not present

## 2020-09-24 DIAGNOSIS — J449 Chronic obstructive pulmonary disease, unspecified: Secondary | ICD-10-CM | POA: Diagnosis not present

## 2020-09-24 DIAGNOSIS — K222 Esophageal obstruction: Secondary | ICD-10-CM | POA: Diagnosis not present

## 2020-09-24 DIAGNOSIS — K219 Gastro-esophageal reflux disease without esophagitis: Secondary | ICD-10-CM | POA: Diagnosis not present

## 2020-09-25 DIAGNOSIS — K222 Esophageal obstruction: Secondary | ICD-10-CM | POA: Diagnosis not present

## 2020-09-25 DIAGNOSIS — F0280 Dementia in other diseases classified elsewhere without behavioral disturbance: Secondary | ICD-10-CM | POA: Diagnosis not present

## 2020-09-25 DIAGNOSIS — K219 Gastro-esophageal reflux disease without esophagitis: Secondary | ICD-10-CM | POA: Diagnosis not present

## 2020-09-25 DIAGNOSIS — M549 Dorsalgia, unspecified: Secondary | ICD-10-CM | POA: Diagnosis not present

## 2020-09-25 DIAGNOSIS — J449 Chronic obstructive pulmonary disease, unspecified: Secondary | ICD-10-CM | POA: Diagnosis not present

## 2020-09-25 DIAGNOSIS — G309 Alzheimer's disease, unspecified: Secondary | ICD-10-CM | POA: Diagnosis not present

## 2020-09-26 DIAGNOSIS — F0280 Dementia in other diseases classified elsewhere without behavioral disturbance: Secondary | ICD-10-CM | POA: Diagnosis not present

## 2020-09-26 DIAGNOSIS — J449 Chronic obstructive pulmonary disease, unspecified: Secondary | ICD-10-CM | POA: Diagnosis not present

## 2020-09-26 DIAGNOSIS — M549 Dorsalgia, unspecified: Secondary | ICD-10-CM | POA: Diagnosis not present

## 2020-09-26 DIAGNOSIS — K219 Gastro-esophageal reflux disease without esophagitis: Secondary | ICD-10-CM | POA: Diagnosis not present

## 2020-09-26 DIAGNOSIS — K222 Esophageal obstruction: Secondary | ICD-10-CM | POA: Diagnosis not present

## 2020-09-26 DIAGNOSIS — G309 Alzheimer's disease, unspecified: Secondary | ICD-10-CM | POA: Diagnosis not present

## 2020-09-28 DEATH — deceased

## 2020-10-29 ENCOUNTER — Ambulatory Visit: Payer: Medicare Other | Admitting: Adult Health

## 2021-01-29 ENCOUNTER — Ambulatory Visit: Payer: Medicare Other | Admitting: Internal Medicine

## 2021-08-07 ENCOUNTER — Encounter: Payer: Medicare Other | Admitting: Internal Medicine
